# Patient Record
Sex: Female | Born: 1935
Health system: Southern US, Community
[De-identification: ages and names within clinical notes are randomized; demographics above are authoritative.]

## PROBLEM LIST (undated history)

## (undated) DIAGNOSIS — E119 Type 2 diabetes mellitus without complications: Secondary | ICD-10-CM

## (undated) DIAGNOSIS — Z9289 Personal history of other medical treatment: Secondary | ICD-10-CM

## (undated) DIAGNOSIS — D649 Anemia, unspecified: Secondary | ICD-10-CM

## (undated) DIAGNOSIS — F419 Anxiety disorder, unspecified: Secondary | ICD-10-CM

## (undated) DIAGNOSIS — I639 Cerebral infarction, unspecified: Secondary | ICD-10-CM

## (undated) DIAGNOSIS — E785 Hyperlipidemia, unspecified: Secondary | ICD-10-CM

## (undated) DIAGNOSIS — H409 Unspecified glaucoma: Secondary | ICD-10-CM

## (undated) DIAGNOSIS — M1990 Unspecified osteoarthritis, unspecified site: Secondary | ICD-10-CM

## (undated) DIAGNOSIS — K219 Gastro-esophageal reflux disease without esophagitis: Secondary | ICD-10-CM

## (undated) DIAGNOSIS — F32A Depression, unspecified: Secondary | ICD-10-CM

## (undated) DIAGNOSIS — Z951 Presence of aortocoronary bypass graft: Secondary | ICD-10-CM

## (undated) DIAGNOSIS — Z5189 Encounter for other specified aftercare: Secondary | ICD-10-CM

## (undated) DIAGNOSIS — I10 Essential (primary) hypertension: Secondary | ICD-10-CM

## (undated) DIAGNOSIS — M810 Age-related osteoporosis without current pathological fracture: Secondary | ICD-10-CM

## (undated) DIAGNOSIS — I2510 Atherosclerotic heart disease of native coronary artery without angina pectoris: Secondary | ICD-10-CM

## (undated) HISTORY — DX: Age-related osteoporosis without current pathological fracture: M81.0

## (undated) HISTORY — DX: Anemia, unspecified: D64.9

## (undated) HISTORY — DX: Cerebral infarction, unspecified: I63.9

## (undated) HISTORY — DX: Essential (primary) hypertension: I10

## (undated) HISTORY — DX: Unspecified glaucoma: H40.9

## (undated) HISTORY — DX: Type 2 diabetes mellitus without complications: E11.9

## (undated) HISTORY — DX: Hyperlipidemia, unspecified: E78.5

## (undated) HISTORY — DX: Personal history of other medical treatment: Z92.89

## (undated) HISTORY — DX: Encounter for other specified aftercare: Z51.89

## (undated) HISTORY — DX: Depression, unspecified: F32.A

## (undated) HISTORY — DX: Gastro-esophageal reflux disease without esophagitis: K21.9

## (undated) HISTORY — DX: Atherosclerotic heart disease of native coronary artery without angina pectoris: I25.10

## (undated) HISTORY — DX: Anxiety disorder, unspecified: F41.9

## (undated) HISTORY — DX: Presence of aortocoronary bypass graft: Z95.1

## (undated) HISTORY — DX: Unspecified osteoarthritis, unspecified site: M19.90

## (undated) NOTE — *Deleted (*Deleted)
Neuromuscular Re-education:  Note: Patient has difficulty rating her dizziness on 0-10 scale and does better with reporting her dizziness as mild, medium or high.   VOR X 1 exercise:  Patient performed VOR X 1 horizontal in standing 3 reps of 1 minute each with mod verbal cues for technique initially.  Patient reports 2/10 dizziness with first rep and reports that her dizziness decreased with subsequent trials. Added VOR x1 with 1 minute reps in standing progression to home exercise program.  Airex pad:  On firm surface and then on Airex pad, patient performed feet together progressions and semi-tandem progressions with alternating lead leg with and without horizontal and vertical head turns with CGA.  Patient reports mild increase in dizziness with head turns and unsteadiness.  Discussed safety precautions with performing HEP at home. Demonstrated and discussed standing in corner with chair in front for safety and then patient demonstrated.  Airex balance beam: Performed static stance with normal and then narrow base of support static holds and then with body turns with CGA. Patient with increased sway noted. Patient reports "high" level of dizziness with this activity.  Ambulation with head turns:  Patient performed 59' trials of forwards and retro ambulation with horizontal and vertical head turns with CGA.  Patient demonstrates no veering. Patient with decreased step length and cadence with retro ambulation. Patient reports "mild" dizziness with these activities.  Patient reports increased dizziness with activities with head turns and body turns this date.  Worked on progressions of VOR x1 exercise, patient able to progress to standing 1 minute repetitions for home exercise program, but required verbal cueing for technique.  Issued semitandem progressions and feet together progressions with horizontal and vertical head turns on firm surface for home exercise program.  Plan to review  home exercise program next session.  Patient would benefit from continued PT services to further address goals and functional deficits.

---

## 2005-07-22 ENCOUNTER — Emergency Department: Payer: Self-pay | Admitting: Emergency Medicine

## 2007-04-16 DIAGNOSIS — Z951 Presence of aortocoronary bypass graft: Secondary | ICD-10-CM

## 2007-04-16 HISTORY — DX: Presence of aortocoronary bypass graft: Z95.1

## 2007-05-07 ENCOUNTER — Encounter (INDEPENDENT_AMBULATORY_CARE_PROVIDER_SITE_OTHER): Payer: Self-pay | Admitting: Cardiovascular Disease

## 2007-05-07 ENCOUNTER — Inpatient Hospital Stay (HOSPITAL_COMMUNITY): Admission: AD | Admit: 2007-05-07 | Discharge: 2007-05-15 | Payer: Self-pay | Admitting: Cardiovascular Disease

## 2007-05-07 HISTORY — PX: CARDIAC CATHETERIZATION: SHX172

## 2007-05-08 ENCOUNTER — Ambulatory Visit: Payer: Self-pay | Admitting: Thoracic Surgery (Cardiothoracic Vascular Surgery)

## 2007-05-09 HISTORY — PX: CORONARY ARTERY BYPASS GRAFT: SHX141

## 2007-06-06 ENCOUNTER — Encounter (HOSPITAL_COMMUNITY): Admission: RE | Admit: 2007-06-06 | Discharge: 2007-09-04 | Payer: Self-pay | Admitting: Cardiovascular Disease

## 2007-06-12 ENCOUNTER — Ambulatory Visit: Payer: Self-pay | Admitting: Thoracic Surgery (Cardiothoracic Vascular Surgery)

## 2007-06-12 ENCOUNTER — Encounter
Admission: RE | Admit: 2007-06-12 | Discharge: 2007-06-12 | Payer: Self-pay | Admitting: Thoracic Surgery (Cardiothoracic Vascular Surgery)

## 2007-09-05 ENCOUNTER — Encounter (HOSPITAL_COMMUNITY): Admission: RE | Admit: 2007-09-05 | Discharge: 2007-10-13 | Payer: Self-pay | Admitting: Cardiovascular Disease

## 2009-07-31 IMAGING — CR DG CHEST 2V
2 series · 2 of 2 positions shown · non-contrast
Comparison: None

CLINICAL DATA: Preoperative assessment for heart surgery, abnormal
stress test, history hypertension, diabetes

CHEST - 2 VIEW

[w chest pa]
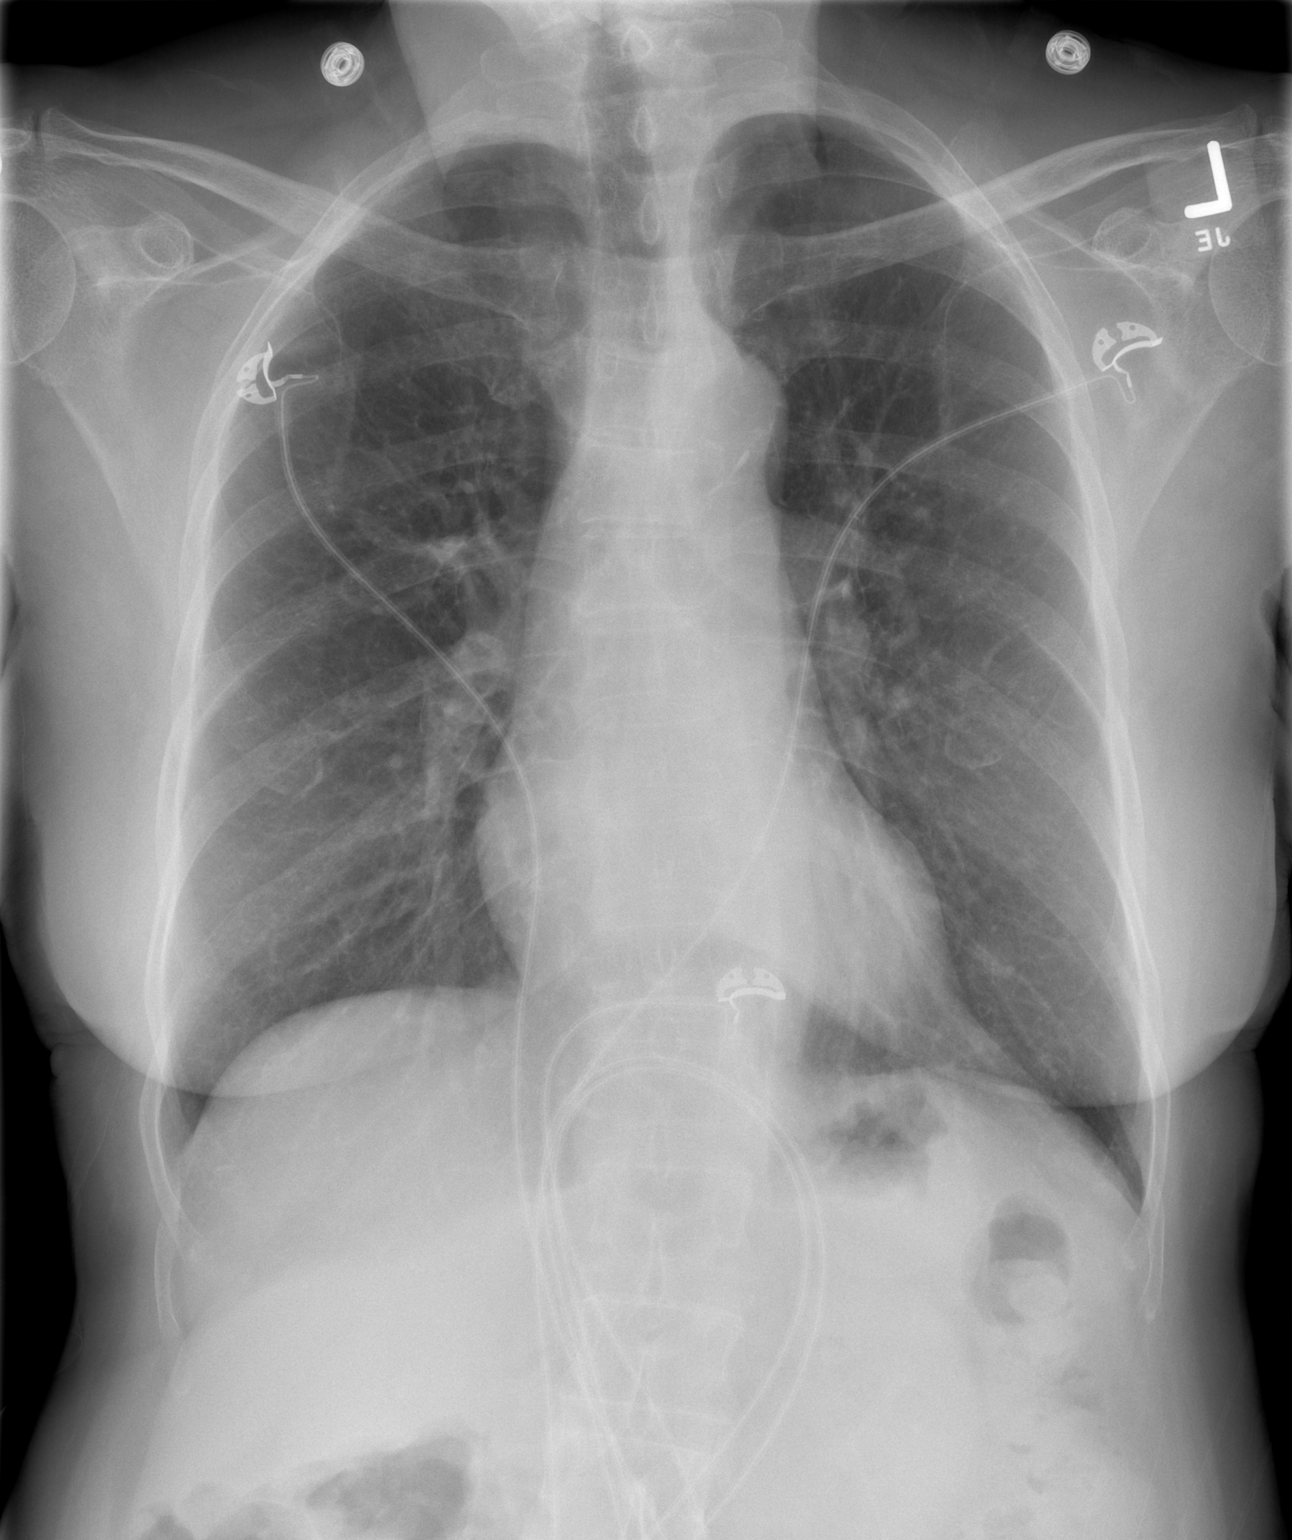

[w chest lat]
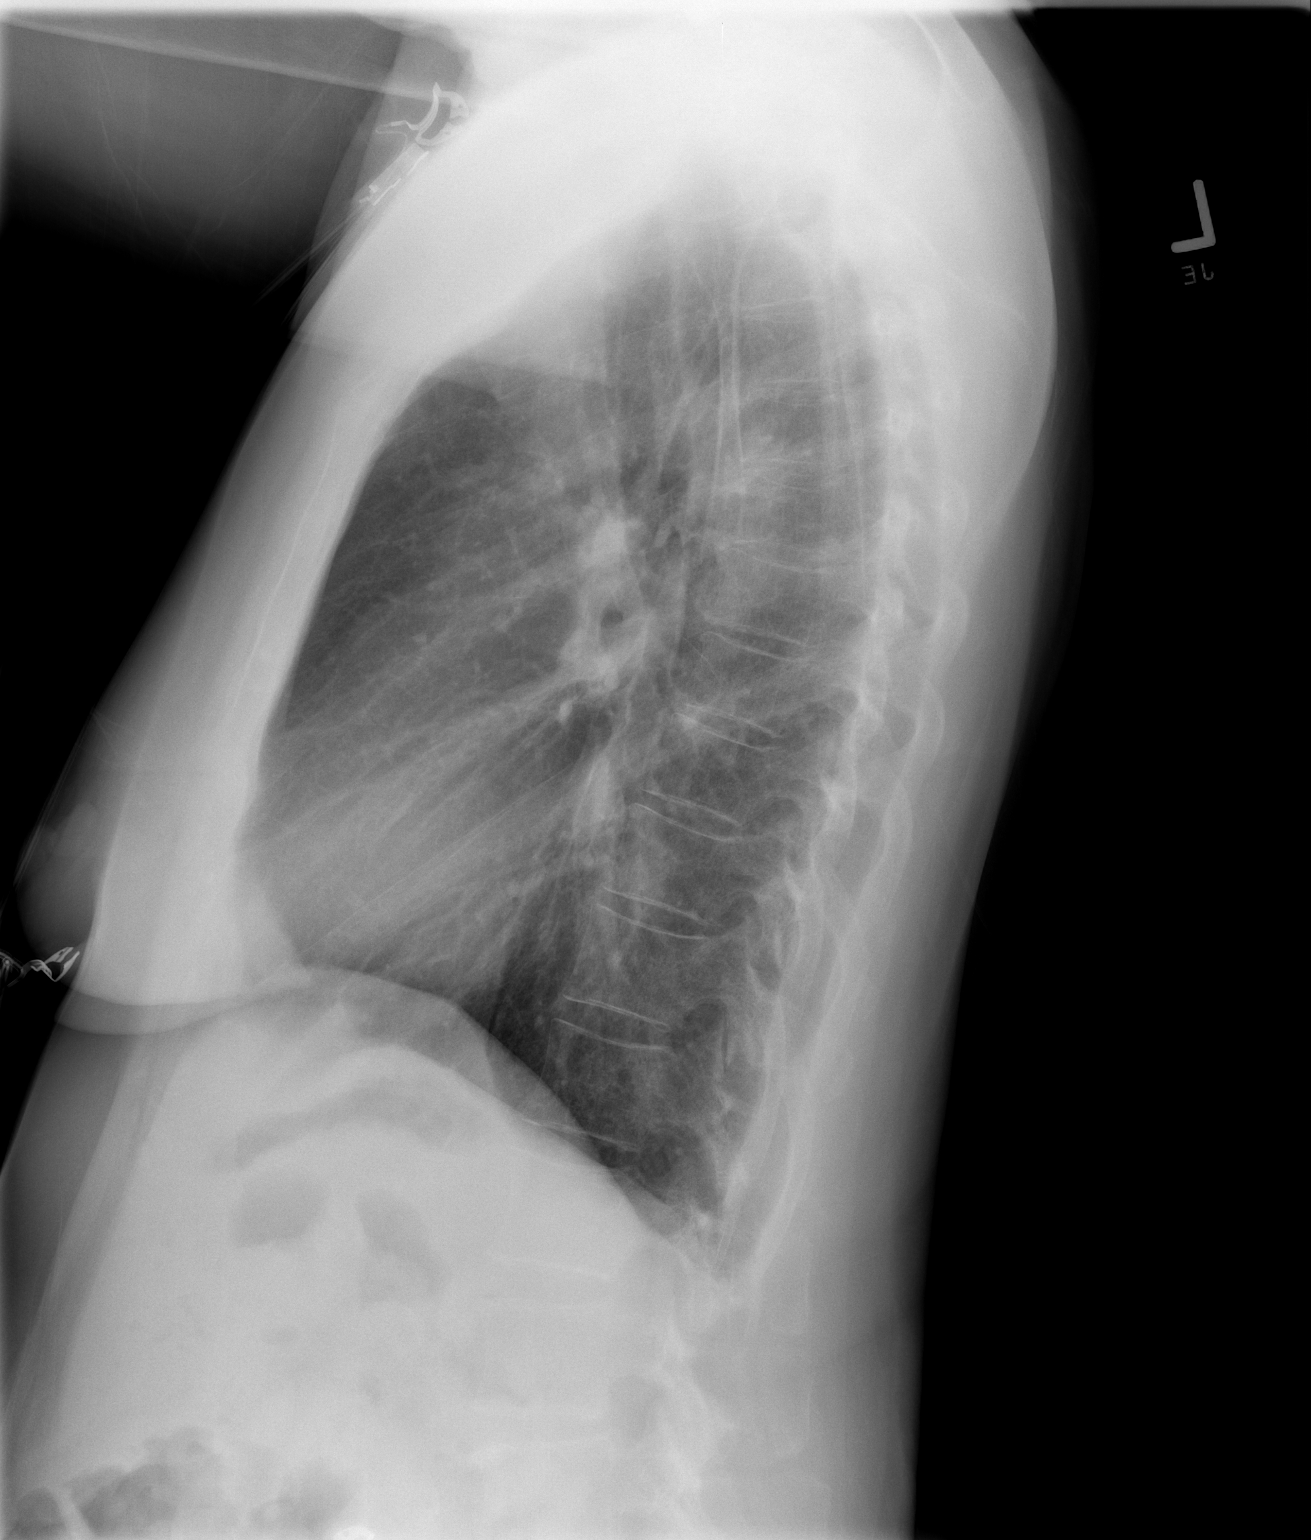

[2 of 2 positions shown; findings below may reference images not displayed]

FINDINGS: Normal heart size, mediastinal contours, and pulmonary vascularity.
Mildly hyperexpanded lungs without infiltrate or effusion.
Broad-based levoconvex scoliosis thoracolumbar spine.
Bony demineralization.
IMPRESSION: Mild pulmonary hyperexpansion and scoliosis without acute
abnormalities.

## 2010-02-13 HISTORY — PX: TRANSTHORACIC ECHOCARDIOGRAM: SHX275

## 2010-05-30 NOTE — Cardiovascular Report (Signed)
NAMEABBYGAYLE, HELFAND NO.:  [id number]   MEDICAL RECORD NO.:  [id number]          PATIENT TYPE:  INP   LOCATION:  4711                         FACILITY:  [facility]   PHYSICIAN:  Nicki Guadalajara, M.D.     DATE OF BIRTH:  December 01, 1932   DATE OF PROCEDURE:  05/07/2007  DATE OF DISCHARGE:                            CARDIAC CATHETERIZATION   INDICATIONS:  [name] is a 77 year old African-American female  who is a patient of Dr. Loma Sender.  She had recently experienced  an episode of chest discomfort after lifting a heavy trash can.  She has  been very active for most of her life.  She has a history of type 2  diabetes mellitus for 10 years.  She recently underwent a stress Myoview  study, which was abnormal demonstrating mild-to-moderate ischemia in the  mid inferior to inferolateral segment.  Definitive cardiac  catheterization was recommended.   PROCEDURE:  After premedication with prednisone, IV Benadryl, Pepcid for  shell allergy, the patient was prepped and draped in usual fashion.  She  also received 3 mg of intravenous Valium.  Her right femoral artery was  punctured anteriorly and a 5-French sheath was inserted.  Diagnostic  catheterization was done utilizing 5-French Judkins for left and right  coronary catheters.  An initial attempt was made to cannulate the left  subclavian system since the patient may require CBG revascularization  surgery.  However, this aortic knob was fairly calcified and since the  catheter did not seem to go easily into this region, the decision was  made not to aggressively pursue this secondary to potential for  thromboembolic risk.  A 5-French pigtail catheter was used for biplane  cine ventriculography.  Distal aortography was also performed.  The  patient tolerated the procedure well.  She returned to her room in  satisfactory condition.   HEMODYNAMIC DATA:  Central aortic pressure was 130/60.  Left ventricular  pressure  was 130/6, post A-wave 12.   ANGIOGRAPHIC DATA:  There was a moderate coronary calcification  involving the left main, LAD system, RCA.   The left main had 20%-30% focal narrowing proximally.  The left main was  a large-caliber vessel which trifurcated into an LAD and intermediate  vessel and left circumflex coronary artery.   The LAD was calcified and had diffuse 90% stenosis proximally involving  the takeoff of the first diagonal vessel.  The first diagonal vessel had  diffuse 80%-90% proximal stenosis and then diffuse 90% mid stenosis  prior to its bifurcation.  The mid LAD had 50%-60% narrowing followed by  80% narrowing after second septal perforating artery.  There was 70%  narrowing after a mid diagonal vessel.  There was a 90% apical LAD  stenosis.   The intermediate vessel had diffuse 99% stenosis, but was moderate size  caliber vessel which did extend to the LV apex.   The circumflex vessel had 60%-70% proximal stenosis after the first  marginal branch.  There was then an 80% stenosis in the mid distal AV  groove circumflex prior to giving rise to  an additional marginal vessel.   The right coronary artery was a large caliber vessel that was diffusely  diseased and had 70%-80% proximal stenosis followed by 70%-80% mid  stenosis and then had 95% bifurcation stenosis involving the acute  marginal branch and distal RCA.  The distal RCA ended in a small PDA  system and posterolateral system.   Biplane cine ventriculography revealed preserved global contractility  with an ejection fraction of approximately 55%.  However, on the RAO  projection, there was a small focal area of mid distal mild  hypocontractility and on the LAO projection there was a mid  posterolateral focal hypocontractility.   Distal aortography revealed mild tortuous infrarenal aorta without  significant stenosis.  There was no evidence for renal artery stenosis.   IMPRESSION:  1. Preserved left  ventricular contractility with an ejection fraction      of 55% but with evidence for focal mild hypocontractility involving      the mid distal inferior wall and mid posterolateral wall.  2. Severe multivessel coronary artery disease with evidence for      significant coronary calcification, 20%-30% proximal left main      stenosis; diffuse 90% proximal left anterior descending stenosis      with 90% stenosis diffusely in the first diagonal branch, 60% and      80% mid left anterior descending stenosis followed by 70% mid      distal left anterior descending stenosis and 90% apical left      anterior descending stenosis; diffuse 99% stenosis in a moderate-      sized ramus intermediate vessel; 67% mid atrioventricular groove      circumflex and 80% distal atrioventricular groove circumflex      stenosis; diffuse 70%-80% proximal to mid right coronary artery      stenosis with 95% bifurcation stenosis involving the right coronary      artery in the region of the crux involving the takeoff of the acute      marginal branch.   RECOMMENDATIONS:  CBG revascularization surgery.           ______________________________  Nicki Guadalajara, M.D.     TK/MEDQ  D:  05/07/2007  T:  05/07/2007  Job:  [id number]   cc:   Celine Mans, MD  Rudi Coco, MD

## 2010-05-30 NOTE — Consult Note (Signed)
NAMEKAIJAH, ABTS                  ACCOUNT NO.:  [id number]   MEDICAL RECORD NO.:  [id number]          PATIENT TYPE:  INP   LOCATION:  4711                         FACILITY:  [facility]   PHYSICIAN:  Salvatore Decent. Dorris Fetch, M.D.DATE OF BIRTH:  03-28-1932   DATE OF CONSULTATION:  05/07/2007  DATE OF DISCHARGE:                                 CONSULTATION   REASON FOR CONSULTATION:  Severe three-vessel coronary disease.   HISTORY OF PRESENT ILLNESS:  Ms. Olthoff is a 77 year old woman who has a  past medical history significant for type 2 noninsulin-dependent  diabetes, dyslipidemia, questionable recent onset hypertension, who  presents following several episodes of atypical chest pain.  She has  seen this for quite some time.  She has been having pain in her back and  as frequently when she is upset or exert herself under the left shoulder  blade.  It feels like it is in the muscle.  She was given a prescription  for Skelaxin and with taking on p.r.n. basis and did have relief from  the pain with that.  Recently while at work after removing a very heavy  object, she experienced a different pain, which was the anterior chest  pain with extension into her arms and shoulders and they felt very heavy  and tired.  She mentioned these symptoms to her physician and was  referred to [facility]. A 2-D echocardiogram was  performed, which showed borderline left ventricular hypertrophy.  There  was normal left ventricular function.  There was no significant valvular  pathology.  A stress Myoview scan was performed, which showed a mild-to-  moderate perfusion defect in the inferior wall.  The defect was  reversible.  Today, she underwent cardiac catheterization where she was  found to have normal left ventricular function, but heavily calcified  coronary arteries with severe three-vessel coronary disease.  The  patient is currently pain free.   PAST MEDICAL HISTORY:  1. Type 2  adult-onset diabetes.  2. Hyperlipidemia.  3. Recent onset hypertension.  4. Anxiety.   ADMISSION MEDICATIONS:  Her medications following admission were,  1. Avandamet 4 mg b.i.d.  2. Skelaxin 800 mg t.i.d. p.r.n.  3. Clorazepate 3.75 mg 1-2 tablets p.r.n. as needed.  4. Nexium 40 mg daily.  5. Aspirin 81 mg daily.  6. Bystolic 2.5 mg daily.  7. Crestor 5 mg daily.  8. She also has a prescription for p.r.n. nitroglycerin, but does not      utilize that.   ALLERGIES:  She has no known drug allergies, but is allergic to  [facility], which causes rash.   FAMILY HISTORY:  Significant for cardiovascular disease.   SOCIAL HISTORY:  She still works.  She lives with 2 of her sons.  She  does not smoke and never has.   REVIEW OF SYSTEMS:  See HPI.  She has noted additional symptoms other  than the HPI.  Recently, she has been feeling more tired and fatigued  over the past several months.  She attributed this to old age.  She  denies any  recent fevers, chills, or sweats.  No change in bowel or  bladder habits.  No stroke or TIA symptoms.  No peripheral edema,  paroxysmal nocturnal dyspnea, or orthopnea.   All other systems are negative.   PHYSICAL EXAMINATION:  GENERAL:  Ms. Brookover is a 77 year old African  American female, in no acute distress.  NEUROLOGICALLY:  She is alert, oriented x3, appropriate, and grossly  intact.  HEENT:  Unremarkable.  NECK:  Supple without thyromegaly, adenopathy, or bruits.  CARDIAC:  Regular rate and rhythm.  Normal S1 and S2.  No murmurs, rubs,  or gallops.  LUNGS:  Clear with equal breath sounds bilaterally.  ABDOMEN:  Soft, nontender.  EXTREMITIES:  Without clubbing, cyanosis, or edema.  She has 2+ pulses  throughout.  SKIN:  Warm and dry.   LABORATORY DATA:  White count 4.1, hematocrit 37, platelets 227.  Glucose 225, BUN and creatinine 13 and 0.88, sodium 130, potassium 3.9.  PT 10.6, PTT 31.  Total cholesterol was 239, HDL was elevated at 90,  and  LDL 135.   IMPRESSION:  Ms. Polsky is a 77 year old woman with multiple cardiac risk  factors who presents with an episode consistent with unstable angina.  She has had previous pain, which was different in her back.  It was  unclear if this is anginal or not.  It is clear, however, that she does  have severe three-vessel coronary disease in the setting of diabetes and  preserved left ventricular function.  Coronary bypass grafting is  indicated for survival benefit and hopefully relief of symptoms, while  the indications, risks and benefits and alternatives discussed in detail  with the patient and her family.  We discussed the general detail of the  operation, need for general anesthesia, incisions to be used and general  approach.  We also discussed expected hospital stay and overall  recovery.  We did discuss the risks which include but are not limited to  death, stroke, myocardial infarction, deep venous thrombosis, pulmonary  embolism, bleeding, possible need for transfusions, infections, as well  as other organ system dysfunction, including respiratory, renal or  gastrointestinal complications.  She understands and accepts these risks  and agrees to proceed.  We will plan to proceed with surgery on Friday,  May 09, 2007.      Salvatore Decent Dorris Fetch, M.D.  Electronically Signed     SCH/MEDQ  D:  05/07/2007  T:  05/08/2007  Job:  [id number]   cc:   Nicki Guadalajara, M.D.  Antonieta Iba, MD  Loma Sender

## 2010-05-30 NOTE — Op Note (Signed)
Laura, Ferguson                  ACCOUNT NO.:  [id number]   MEDICAL RECORD NO.:  [id number]          PATIENT TYPE:  INP   LOCATION:  2303                         FACILITY:  [facility]   PHYSICIAN:  Salvatore Decent. Dorris Fetch, M.D.DATE OF BIRTH:  1932-05-22   DATE OF PROCEDURE:  DATE OF DISCHARGE:                               OPERATIVE REPORT   PREOPERATIVE DIAGNOSIS:  Three-vessel coronary disease with new-onset  angina.   POSTOPERATIVE DIAGNOSIS:  Three-vessel coronary disease with new-onset  angina.   PROCEDURE:  Median sternotomy, extracorporeal circulation, coronary  artery bypass grafting x6 (left internal mammary artery to LAD,  saphenous vein graft to ramus intermedius, sequential saphenous vein  graft to obtuse marginals 1 and 2, sequential saphenous vein graft to  acute marginal and distal right coronary), and endoscopic vein harvest  right leg.   SURGEON:  Salvatore Decent. Dorris Fetch, MD   ASSISTANT:  Sheliah Plane, MD   SECOND ASSISTANT:  Theda Belfast, [place]   ANESTHESIA:  General.   FINDINGS:  Good quality targets, good quality mammary vein, fair-quality  but satisfactory normal left ventricular size, good left ventricular  function.   CLINICAL NOTE:  Laura Ferguson is a 77 year old woman with new-onset angina.  She had a positive Cardiolite test and underwent cardiac catheterization  where she was found to have critical three-vessel coronary disease with  heavily calcified vessels.  The patient was referred for coronary artery  bypass grafting.  The indications, risks, benefits, and alternatives  were discussed in detail with the patient and her family.  She  understood, accepted the risks, and agreed to proceed.   OPERATIVE NOTE:  Laura Ferguson was brought to the preop holding area on May 09, 2007.  There, lines were placed by anesthesia for monitoring  arterial and central venous and pulmonary arterial pressures.  Intravenous antibiotics were administered.  She was taken  to the  operating room, anesthetized, and intubated.  A Foley catheter was  placed.  The chest, abdomen, and legs were prepped and draped in usual  fashion.  Incision was made in the medial aspect of the right leg at the  level of the medial greater saphenous vein, was identified and was  harvested endoscopically from the right leg.  The saphenous vein was  satisfactory.   Simultaneously, a median sternotomy was performed and the left internal  mammary artery was harvested using standard technique.  A 2000 units of  heparin was administered during the vessel harvest, remainder of the  full heparin dose was given prior to opening the pericardium.  The  sternum was relatively narrow, harvest of the mammary artery was more  difficult because of deep interspaces; however, the mammary was a good-  quality vessel with excellent flow when divided distally.   The pericardium was opened.  The remaining full heparin dose was given.  The ascending aorta was of normal size with no evident atherosclerotic  disease.  The aorta was cannulated via concentric 2-0 Ethibond pledgeted  and pursestring sutures.  A dual stage venous cannula was placed via  pursestring suture in the right  appendage.  After confirming adequate  anticoagulation with ACT measurement, cardiopulmonary bypass was  instituted, and the patient was cooled to 32 degrees Celsius.  The  coronary arteries were inspected and anastomotic sites were chosen.  Of  note, the diagonal branch to the LAD was diffusely diseased and was too  small to graft beyond the disease.  The remaining target vessels were  all graftable.  The LAD was intramyocardial.   The conduits were inspected and cut to length.  A foam pad was placed in  the pericardium to protect left phrenic nerve and insulate the heart.  A  temperature probe was placed in myocardial septum and a cardioplegic  cannula was placed in the ascending aorta.   The aorta was cross-clamped.   The left ventricle was emptied via aortic  root vent.  Cardiac arrest was achieved with combination of cold,  antegrade blood cardioplegia, and topical iced saline.  A 1 L of  cardioplegia was administered.  Myocardial septal temperature was less  than 10 degrees Celsius.  There was a good diastolic arrest, following  distal anastomoses were performed.   First, a reversed saphenous vein graft was placed sequentially to the  acute marginal and distal right coronary.  There was a severe stenosis  at the bifurcation of the acute marginal.  It was a 1.5-mm good quality  target.  A side-to-side anastomosis was performed to this vessel with a  running 7-0 Prolene suture.  At the completion of each anastomosis, it  was probed proximally and distally to ensure patency before tying the  suture.  The distal end of the vein then was cut to length and was  anastomosed end-to-side to the distal right coronary, which was a 1.5 mL  vessel.  The posterior descending and other terminal branches of the  right coronary were too small to graft separately.  Cardioplegia was  administered down the vein graft.  There was good flow and good  hemostasis.   Next, a reverse saphenous vein grafts placed end-to-side to ramus  intermedius branch.  This was a 1.5-mm good quality target vessel.  The  vein graft was anastomosed end-to-side with a running 7-0 Prolene  suture.  This vein segment was smaller in caliber than the other 2 vein  segments, but was still acceptable and satisfactory for use as a graft.   Next, a reversed saphenous vein graft was placed sequentially to obtuse  marginal 1 and 2.  Obtuse marginal 1 was a relatively high anterolateral  vessel, was intramyocardial as a 1.5-mm diameter vessel.  OM-2 was a  larger caliber vessel, was grafted just before bifurcated.  There was  some moderate plaquing at the site of anastomosis.  Again, there was  good flow through this graft and good hemostasis at  anastomoses.   Next, the left internal mammary artery was brought through a window in  the pericardium.  The distal end was beveled and was anastomosed end-to-  side to the LAD.  The LAD was intramyocardial.  It accepted a 1.5-mm  probe.  There was some disease proximal to the anastomosis, but no  disease distally to the apex.  The mammary was a 2-mm good quality  conduit.  The anastomosis was performed with a running 8-0 Prolene  suture.  After completion of the mammary to LAD anastomosis, the bulldog  clamp was removed to inspect for hemostasis.  Immediate and rapid septal  rewarming was noted.  The bulldog clamp was replaced.  Additional  cardioplegia was administered.   The vein grafts were cut to length.  The cardioplegic cannula was  removed from the ascending aorta and proximal vein graft anastomoses  were performed to 4.5 mL punch aortotomies with running 6-0 Prolene  sutures.  At the completion of final proximal anastomoses, the patient  was placed in Trendelenburg position.  The bulldog clamp was again  removed from the left mammary artery.  Lidocaine was administered.  The  aortic root was de-aired and the aortic cross-clamp was removed.  The  total crossclamp time was 93 minutes.  The patient spontaneously resumed  sinus rhythm and did not require defibrillation.   The patient is being rewarmed, all proximal and distal anastomoses were  inspected for hemostasis.  Epicardial pacing wires were placed on the  right ventricle and right atrium.  Atrial pacing was initiated as the  patient had a relatively slow sinus rhythm in the 60s.  She was then  paced at 90 beats per minute.  Then, the patient rewarmed to a core  temperature of 37 degrees Celsius.  She was weaned from cardiopulmonary  bypass on the first attempt.  Total bypass time was 135 minutes.  She  did not require inotropic support.  The initial cardiac index was 2 L  per minute per meter squared.  The patient remained  hemodynamically  stable throughout post bypass period with the exception of initially  decreased cardiac output, initially after closure of the sternotomy  which responded to volume administration.   A test dose of protamine was administered and was well tolerated.  The  atrial aortic cannulae were removed.  The remaining protamine was  administered without incident.  Chest was irrigated with 1 L of warm  normal saline containing 1 g of vancomycin.  Hemostasis was achieved.  The pericardium was reapproximated with interrupted 3-0 silk sutures.  It came together easily without tension or kinking the underlying  grafts.  The left pleural and mediastinal chest tubes placed separate  subcostal incisions.  The sternum was closed with combination of single  and double interrupted heavy gauge stainless steel wires.  Pectoralis  fascia, subcutaneous tissue, and skin were closed in standard fashion.  Of note, the patient did have transient decrease in cardiac index with  closure of the sternum, although no significant EKG changes or change in  blood pressure or pulmonary arterial pressures.  This improved with time  and volume administration.  At the completion of the procedure; all  sponge, needle, and instruments counts were correct, and the patient was  taken from the operating room to the surgical intensive care unit in  fair condition.      Salvatore Decent Dorris Fetch, M.D.  Electronically Signed     SCH/MEDQ  D:  05/09/2007  T:  05/10/2007  Job:  [id number]   cc:   Laura Ferguson

## 2010-05-30 NOTE — Assessment & Plan Note (Signed)
OFFICE VISIT   Laura Ferguson, Laura Ferguson  DOB:  1932/11/17                                        Jun 12, 2007  CHART #:  [id number]   The patient is a 77 year old woman who had coronary bypass grafting x6  on April 24.  Postoperatively she had some volume overload, which is  typical as well as thrombocytopenia, but really did well and was  discharged home on postoperative day #4.  Since then she has continued  to do well.  She has minimal discomfort.  She does have a little ache in  her left shoulder.  This is not exertional.  She has been walking.  Prior to surgery she could only walk about 50 feet before having pain.  Now she is having no pain or shortness of breath.  She did have some  swelling in her right leg.  She was started on Lasix but did not  tolerate it because of her blood pressure; however, with elevation the  swelling has resolved.   PHYSICAL EXAMINATION:  The patient is a 77 year old African American  female in no acute distress.  Her blood pressure is 126/70, pulse 78,  respirations are 18.  Her oxygen saturation is 98% on room air.  Lungs  are clear with equal breath sounds.  Her cardiac exam has a regular rate  and rhythm.  Normal S1 and S2.  There are no rubs or murmurs.  The  sternum is stable.  The sternal incision is clean, dry, and intact.  Chest tube sites are healing well.  Her leg incisions are healing well.  She has a trace edema in her right lower extremity.   Chest x-ray shows good aeration of the lungs bilaterally with no  significant effusion or infiltrate.   IMPRESSION:  The patient is doing extremely well at this point in time.  She is now about a month out from coronary bypass grafting x6.  Her  exercise tolerance is already good and has continued to improve.  She is  not having any significant pain and is not having to take any pain  medications since she has been home.  She may begin driving.  Appropriate precautions were  discussed.  She is not to lift any objects  that weigh greater than 10 pounds for at least another 2 weeks and after  that can build up gradually.  She will continue to be followed by Dr.  Tresa Endo and  Dr. Vear Clock.  I would be happy to see her back at any time if I can be  of any further assistance with her care.   Salvatore Decent Dorris Fetch, M.D.  Electronically Signed   SCH/MEDQ  D:  06/12/2007  T:  06/12/2007  Job:  [id number]   cc:   Nicki Guadalajara, M.D.  Loma Sender

## 2010-05-30 NOTE — Discharge Summary (Signed)
Laura Ferguson                  ACCOUNT NO.:  [id number]   MEDICAL RECORD NO.:  [id number]          PATIENT TYPE:  INP   LOCATION:  2016                         FACILITY:  [facility]   PHYSICIAN:  Laura Ferguson, M.D.DATE OF BIRTH:  1932/03/16   DATE OF ADMISSION:  05/07/2007  DATE OF DISCHARGE:                               DISCHARGE SUMMARY   FINAL DIAGNOSES:  Severe three-vessel coronary artery disease with new-  onset angina.   IN-HOSPITAL DIAGNOSES:  1. Postoperative thrombocytopenia.  2. Volume overload, postoperatively.   SECONDARY DIAGNOSES:  1. Type 2 diabetes mellitus.  2. Hyperlipidemia.  3. Hypertension.  4. Anxiety.   IN HOSPITAL OPERATIONS AND PROCEDURES:  1. Cardiac catheterization.  2. Coronary artery bypass grafting x6 using a left internal mammary      artery to left anterior descending, saphenous vein graft to ramus      intermedius, sequential saphenous vein graft to obtuse marginals I      and II, sequential saphenous vein graft to acute marginal, distal      right coronary artery.  Endoscopic vein harvesting from right leg      done.   HISTORY AND PHYSICAL AND HOSPITAL COURSE:  The patient is a 77 year old  female with new-onset angina.  She had a positive Cardiolite test and  underwent cardiac catheterization where she was found to have critical  three-vessel coronary artery disease and heavily calcified vessels.  The  patient was referred for coronary artery bypass grafting.  She was seen  and evaluated by Dr. Dorris Ferguson.  Dr. Dorris Ferguson discussed with the  patient undergoing coronary artery bypass grafting.  He discussed risks  and benefits with the patient.  The patient nods her understanding and  agreed to proceed.  Surgery was scheduled for May 09, 2007.   For details of the patient's past medical history and physical exam,  please see dictated H&P.   The patient was taken to the operating room on May 09, 2007, where she  underwent  coronary artery bypass grafting x6 using a left internal  mammary artery to left anterior descending, saphenous vein graft to  ramus intermedius, sequential saphenous vein graft to obtuse marginals I  and II, sequential saphenous vein graft acute marginal and distal right  coronary artery.  Endoscopic vein harvesting done from right leg.  The  patient tolerated this procedure well and was transferred to the  Intensive Care Unit in stable condition.  Postoperatively, the patient  was noted to be hemodynamically stable.  She was extubated in the  evening of surgery.  Post extubation, the patient noted to be alert and  oriented x4.  Neuro, intact.  The patient's postoperative course was  pretty much unremarkable.  Chest x-ray done on postop day #1 was stable.  Minimum drainage from chest tubes and chest tube discontinued in normal  fashion.  Repeat followup chest x-ray remained stable with no  pneumothorax.  She was able to be weaned off oxygen sating greater than  90% on room air.  Postoperatively, the patient was noted to be in normal  sinus rhythm.  All drips were weaned and discontinued.  Swan-Ganz  catheter discontinued in normal fashion.  The patient's heart rate and  blood pressure remained stable and she was able to be started on low-  dose beta blocker.  She remained in normal sinus rhythm during the  postoperative course.  The patient was eventually also started on low-  dose ACE inhibitor.  Postoperatively, the patient's platelet count was  noted to be low with platelets dropping to 64 on postop day #2.  Aspirin  was placed on hold.  Following day, the platelet count at 62.  Noted to  be stable and aspirin was restarted and heparin flush was discontinued.  We will follow up.  The patient had mild volume overload  postoperatively.  She was started on diuretics.  Daily weights obtained.  The patient was back near baseline weight prior to discharge home.  The  patient remained  hemodynamically stable.  She was ambulating with  cardiac rehab well with minimal assistance.  She was tolerating diet  well.  No nausea, vomiting noted.  She was transferred out from the SICU  to 2000 postop day #2.  By postop day #3, the patient's vital signs  noted to be stable.  She was afebrile.  Sats greater than 90% on room  air.  Blood sugars were followed and remained stable.  She was restarted  on her Avandamet from dose and discontinued from Lantus.  Blood sugars  sugars remained stable.   Labs showed a white count of 7.6, hemoglobin of 11.0 hematocrit 32.0,  platelet count 62.  Sodium was 141, potassium 4.1, chloride of 110,  bicarb of 25, BUN of 19, creatinine 1.05, glucose of 109.   The patient is tentatively ready for discharge home in the a.m. and she  remained stable.   FOLLOW-UP APPOINTMENTS:  Follow-up appointment has been arranged with  Dr. Dorris Ferguson for Jun 12, 2007, at 12:00 noon p.m..  The patient will  need to obtain PMI chest x-ray 30 minutes prior to this appointment.  The patient needs followup with Dr. Mariah Ferguson in 2 weeks.  She will need to  contact his office to make these arrangements.   ACTIVITY:  The patient was instructed no driving, he agrees to do so, no  lifting over 10 pounds.  She is told to ambulate 3-4 times per day,  progress as tolerated and to continue her breathing exercises.   INCISIONAL CARE:  The patient is told to shower, washing his incisions  using soap and water.  She is to contact the office if she develops any  drainage or opening from any of her incision sites.   DIET:  The patient is to begin on diet to be low-fat, low-salt.   DISCHARGE MEDICATIONS:  1. Avandamet 4/500 b.i.d.  2. Skelaxin 800 mg t.i.d.  3. Nexium 40 mg daily.  4. Enteric-coated aspirin 81 mg daily.  5. Crestor 5 mg daily.  6. Travatan 0.004% 1 drop both eyes at night.  7. Toprol XL 25 mg daily.  8. Lisinopril 10 mg daily.  9. Potassium chloride 20 mEq  daily x3 days.  10.Lasix 40 mg daily x3 days.  11.Oxycodone 5 mg one-two tablets q. 4-6 h. p.r.n.      Theda Belfast, PA      Laura Ferguson, M.D.  Electronically Signed    KMD/MEDQ  D:  05/12/2007  T:  05/13/2007  Job:  [id number]   cc:   Laura Ferguson, M.D.  Marcial Pacas  Elmarie Mainland, MD

## 2010-10-10 LAB — CBC
HCT: 28.1 — ABNORMAL LOW
HCT: 28.3 — ABNORMAL LOW
HCT: 29.6 — ABNORMAL LOW
HCT: 31.1 — ABNORMAL LOW
HCT: 32 — ABNORMAL LOW
HCT: 32.1 — ABNORMAL LOW
HCT: 33 — ABNORMAL LOW
HCT: 36.2
HCT: 37.1
Hemoglobin: 10.6 — ABNORMAL LOW
Hemoglobin: 10.7 — ABNORMAL LOW
Hemoglobin: 11 — ABNORMAL LOW
MCHC: 33.3
MCHC: 34
MCHC: 34
MCHC: 34.3
MCHC: 34.4
MCHC: 34.4
MCHC: 34.5
MCV: 89.8
MCV: 90.1
MCV: 90.4
MCV: 90.7
MCV: 90.8
MCV: 91
MCV: 91.5
MCV: 91.7
MCV: 91.8
Platelets: 156
Platelets: 175
Platelets: 62 — ABNORMAL LOW
Platelets: 64 — ABNORMAL LOW
Platelets: 75 — ABNORMAL LOW
Platelets: 76 — ABNORMAL LOW
Platelets: 85 — ABNORMAL LOW
Platelets: 87 — ABNORMAL LOW
RBC: 3.26 — ABNORMAL LOW
RBC: 3.42 — ABNORMAL LOW
RBC: 3.49 — ABNORMAL LOW
RBC: 3.52 — ABNORMAL LOW
RBC: 4.12
RDW: 14.3
RDW: 14.9
RDW: 15.1
RDW: 15.3
RDW: 15.3
RDW: 15.3
RDW: 15.4
WBC: 4.7
WBC: 5.4
WBC: 5.7
WBC: 6.2
WBC: 6.7
WBC: 7.6

## 2010-10-10 LAB — BASIC METABOLIC PANEL
BUN: 12
BUN: 17
BUN: 17
BUN: 19
BUN: 28 — ABNORMAL HIGH
BUN: 31 — ABNORMAL HIGH
CO2: 24
CO2: 25
CO2: 25
CO2: 25
CO2: 28
Calcium: 8.4
Calcium: 8.8
Calcium: 8.9
Chloride: 108
Chloride: 108
Chloride: 109
Chloride: 110
Chloride: 110
Chloride: 116 — ABNORMAL HIGH
Creatinine, Ser: 1
Creatinine, Ser: 1.05
GFR calc Af Amer: >60
GFR calc Af Amer: >60
GFR calc Af Amer: >60
GFR calc non Af Amer: 52 — ABNORMAL LOW
GFR calc non Af Amer: 55 — ABNORMAL LOW
GFR calc non Af Amer: >60
Glucose, Bld: 109 — ABNORMAL HIGH
Glucose, Bld: 133 — ABNORMAL HIGH
Glucose, Bld: 134 — ABNORMAL HIGH
Glucose, Bld: 181 — ABNORMAL HIGH
Glucose, Bld: 242 — ABNORMAL HIGH
Potassium: 3.8
Potassium: 3.8
Potassium: 3.9
Potassium: 4
Potassium: 4
Potassium: 4.1
Potassium: 4.1
Sodium: 140
Sodium: 141
Sodium: 142
Sodium: 145

## 2010-10-10 LAB — COMPREHENSIVE METABOLIC PANEL
AST: 15
Albumin: 3.3 — ABNORMAL LOW
Calcium: 9
Creatinine, Ser: 1.1
GFR calc Af Amer: 59 — ABNORMAL LOW

## 2010-10-10 LAB — POCT I-STAT 3, ART BLOOD GAS (G3+)
Acid-base deficit: 2
Acid-base deficit: 5 — ABNORMAL HIGH
Acid-base deficit: 5 — ABNORMAL HIGH
Bicarbonate: 20.5
Bicarbonate: 22.9
Bicarbonate: 24.1 — ABNORMAL HIGH
O2 Saturation: 100
O2 Saturation: 96
Operator id: 179741
Operator id: 271091
Operator id: 3342
Operator id: 3342
Patient temperature: 35.1
TCO2: 22
TCO2: 23
TCO2: 23
pCO2 arterial: 33.2 — ABNORMAL LOW
pCO2 arterial: 38.3
pH, Arterial: 7.388
pH, Arterial: 7.441 — ABNORMAL HIGH
pH, Arterial: 7.479 — ABNORMAL HIGH
pO2, Arterial: 101 — ABNORMAL HIGH
pO2, Arterial: 114 — ABNORMAL HIGH
pO2, Arterial: 341 — ABNORMAL HIGH

## 2010-10-10 LAB — BLOOD GAS, ARTERIAL
Acid-base deficit: 0.5
Bicarbonate: 23.2
FIO2: 0.21
O2 Saturation: 97.4
Patient temperature: 98.7
TCO2: 24.3

## 2010-10-10 LAB — PROTIME-INR
INR: 1
Prothrombin Time: 13.8

## 2010-10-10 LAB — POCT I-STAT, CHEM 8
Chloride: 106
Glucose, Bld: 275 — ABNORMAL HIGH
HCT: 32 — ABNORMAL LOW
Hemoglobin: 10.9 — ABNORMAL LOW
Potassium: 3.8
Sodium: 143

## 2010-10-10 LAB — POCT I-STAT 4, (NA,K, GLUC, HGB,HCT)
Glucose, Bld: 160 — ABNORMAL HIGH
Glucose, Bld: 215 — ABNORMAL HIGH
Glucose, Bld: 92
Glucose, Bld: 99
HCT: 18 — ABNORMAL LOW
HCT: 25 — ABNORMAL LOW
HCT: 31 — ABNORMAL LOW
Hemoglobin: 10.5 — ABNORMAL LOW
Hemoglobin: 8.5 — ABNORMAL LOW
Operator id: 3342
Potassium: 4
Potassium: 4
Potassium: 4
Potassium: 5.6 — ABNORMAL HIGH
Sodium: 138
Sodium: 140
Sodium: 141

## 2010-10-10 LAB — HEPARIN LEVEL (UNFRACTIONATED): Heparin Unfractionated: 1.9 — ABNORMAL HIGH

## 2010-10-10 LAB — CROSSMATCH
ABO/RH(D): O POS
Antibody Screen: NEGATIVE

## 2010-10-10 LAB — I-STAT EC8
Acid-base deficit: 7 — ABNORMAL HIGH
BUN: 7
Chloride: 113 — ABNORMAL HIGH
Glucose, Bld: 237 — ABNORMAL HIGH
pCO2 arterial: 43.3
pH, Arterial: 7.267 — ABNORMAL LOW

## 2010-10-10 LAB — URINALYSIS, ROUTINE W REFLEX MICROSCOPIC
Bilirubin Urine: NEGATIVE
Glucose, UA: >1000 — AB
Ketones, ur: NEGATIVE
Specific Gravity, Urine: 1.02
pH: 5.5

## 2010-10-10 LAB — CREATININE, SERUM
Creatinine, Ser: 1.07
GFR calc Af Amer: >60
GFR calc non Af Amer: 50 — ABNORMAL LOW

## 2010-10-10 LAB — CLOSTRIDIUM DIFFICILE EIA: C difficile Toxins A+B, EIA: NEGATIVE

## 2010-10-10 LAB — APTT: aPTT: 30

## 2010-10-10 LAB — LIPID PANEL
Cholesterol: 186
HDL: 95
LDL Cholesterol: 85
Total CHOL/HDL Ratio: 2
Triglycerides: 31
VLDL: 6

## 2010-10-10 LAB — HEMOGLOBIN AND HEMATOCRIT, BLOOD: Hemoglobin: 8.8 — ABNORMAL LOW

## 2010-10-10 LAB — URINE MICROSCOPIC-ADD ON

## 2010-10-10 LAB — MAGNESIUM: Magnesium: 2.9 — ABNORMAL HIGH

## 2011-05-04 ENCOUNTER — Encounter (INDEPENDENT_AMBULATORY_CARE_PROVIDER_SITE_OTHER): Payer: Medicare PPO | Admitting: Ophthalmology

## 2011-05-04 DIAGNOSIS — H35039 Hypertensive retinopathy, unspecified eye: Secondary | ICD-10-CM

## 2011-05-04 DIAGNOSIS — E11319 Type 2 diabetes mellitus with unspecified diabetic retinopathy without macular edema: Secondary | ICD-10-CM

## 2011-05-04 DIAGNOSIS — I10 Essential (primary) hypertension: Secondary | ICD-10-CM

## 2011-05-04 DIAGNOSIS — H43819 Vitreous degeneration, unspecified eye: Secondary | ICD-10-CM

## 2011-05-04 DIAGNOSIS — E1139 Type 2 diabetes mellitus with other diabetic ophthalmic complication: Secondary | ICD-10-CM

## 2011-05-04 DIAGNOSIS — H2510 Age-related nuclear cataract, unspecified eye: Secondary | ICD-10-CM

## 2011-05-04 DIAGNOSIS — H3581 Retinal edema: Secondary | ICD-10-CM

## 2011-05-04 DIAGNOSIS — E1165 Type 2 diabetes mellitus with hyperglycemia: Secondary | ICD-10-CM

## 2011-05-14 ENCOUNTER — Ambulatory Visit (INDEPENDENT_AMBULATORY_CARE_PROVIDER_SITE_OTHER): Payer: Medicare PPO | Admitting: Ophthalmology

## 2011-05-14 DIAGNOSIS — H3581 Retinal edema: Secondary | ICD-10-CM

## 2011-09-13 ENCOUNTER — Ambulatory Visit (INDEPENDENT_AMBULATORY_CARE_PROVIDER_SITE_OTHER): Payer: Medicare PPO | Admitting: Ophthalmology

## 2011-11-22 DIAGNOSIS — Z9289 Personal history of other medical treatment: Secondary | ICD-10-CM

## 2011-11-22 HISTORY — DX: Personal history of other medical treatment: Z92.89

## 2012-07-08 ENCOUNTER — Other Ambulatory Visit: Payer: Self-pay

## 2012-07-08 MED ORDER — METOPROLOL SUCCINATE ER 50 MG PO TB24: 50.0000 mg | ORAL_TABLET | Freq: Every day | ORAL | Status: DC

## 2012-07-08 NOTE — Telephone Encounter (Signed)
Rx was sent to pharmacy electronically. 

## 2012-11-27 ENCOUNTER — Ambulatory Visit (INDEPENDENT_AMBULATORY_CARE_PROVIDER_SITE_OTHER): Payer: Self-pay | Admitting: Ophthalmology

## 2012-12-09 ENCOUNTER — Other Ambulatory Visit: Payer: Self-pay | Admitting: *Deleted

## 2012-12-09 MED ORDER — METOPROLOL SUCCINATE ER 50 MG PO TB24: 50.0000 mg | ORAL_TABLET | Freq: Every day | ORAL | Status: DC

## 2012-12-09 NOTE — Telephone Encounter (Signed)
Rx was sent to pharmacy electronically. 

## 2012-12-18 ENCOUNTER — Encounter: Payer: Self-pay | Admitting: Cardiovascular Disease

## 2012-12-18 ENCOUNTER — Encounter: Payer: Self-pay | Admitting: *Deleted

## 2012-12-19 ENCOUNTER — Ambulatory Visit: Payer: Medicare PPO | Admitting: Cardiovascular Disease

## 2012-12-29 ENCOUNTER — Ambulatory Visit (INDEPENDENT_AMBULATORY_CARE_PROVIDER_SITE_OTHER): Payer: Medicare PPO | Admitting: Cardiovascular Disease

## 2012-12-29 ENCOUNTER — Encounter: Payer: Self-pay | Admitting: Cardiovascular Disease

## 2012-12-29 VITALS — BP 150/70 | HR 86 | Ht 63.0 in | Wt 128.0 lb

## 2012-12-29 DIAGNOSIS — E785 Hyperlipidemia, unspecified: Secondary | ICD-10-CM | POA: Insufficient documentation

## 2012-12-29 DIAGNOSIS — E119 Type 2 diabetes mellitus without complications: Secondary | ICD-10-CM

## 2012-12-29 DIAGNOSIS — I10 Essential (primary) hypertension: Secondary | ICD-10-CM

## 2012-12-29 DIAGNOSIS — I2510 Atherosclerotic heart disease of native coronary artery without angina pectoris: Secondary | ICD-10-CM | POA: Insufficient documentation

## 2012-12-29 NOTE — Progress Notes (Signed)
Patient ID: Laura Ferguson, female   DOB: 1932/09/25, 79 y.o.   MRN: [id number]     HPI: Laura Ferguson is a 79 y.o. female who presents to the office for one year cardiology evaluation.  Ms. is now 78 years old. In April 2009 she underwent CABG revascularization surgery by Dr. Dorris Fetch in the LIMA graft was placed to the LAD, a vein to the intermediate, sequential vein to the OM one and one 2, and sequential vein to the acute margin the distal right cardiac artery. Additional problems include hypertension, hyperlipidemia, type 2 diabetes mellitus.  Since I last saw her, she has gotten remarried. Her husband is 78 years old, 6 years or younger.  Over the past year, Laura Ferguson continues to feel well. She remains active. She denies recurrent anginal symptoms. She denies PND or orthopnea. At times she does note some sinus drainage.  Past Medical History  Diagnosis Date  . 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 CAD (coronary artery disease)   . Hypertension   . Hyperlipidemia   . Type 2 diabetes mellitus   . History of nuclear stress test 11/22/2011    bruce myoview; normal pattern of perfusio; post-stress EF 76%; low risk scan    Past Surgical History  Procedure Laterality Date  . Transthoracic echocardiogram  02/13/2010    EF =>55%, vigorous contraction EF 65%; LA mild-mod dilated; IV normal diameter - normal CVP; trace MR; mild TR; trace AV regurg  . Cardiac catheterization  05/07/2007    EF 55%, focal mild hypocontractility in mid-distal inferior wall & mid posterolateral wall; severe multivessel CAD - susequent CABGx6 (Dr. Bishop Limbo)  . Coronary artery bypass graft  05/09/2007    LIMA to LAD, veing to intermediate; SVG to OM1 & OM2; SVG to acute marginal & distal RCA (Dr. Dorris Fetch)    Allergies  Allergen Reactions  . Shellfish Allergy     Current Outpatient Prescriptions  Medication Sig Dispense  Refill  . aspirin 81 MG tablet Take 81 mg by mouth daily.      Marland Kitchen glipiZIDE-metformin (METAGLIP) 5-500 MG per tablet Take 1 tablet by mouth 2 (two) times daily before a meal.      . insulin aspart (NOVOLOG) 100 UNIT/ML injection Inject 16 Units into the skin once.       . metoprolol succinate (TOPROL-XL) 50 MG 24 hr tablet Take 1 tablet (50 mg total) by mouth daily. Take with or immediately following a meal.  30 tablet  0  . Multiple Vitamin (MULTIVITAMIN) capsule Take 1 capsule by mouth daily.      . rosuvastatin (CRESTOR) 10 MG tablet Take 10 mg by mouth daily.      . Tetrahydrozoline HCl (EYE DROPS OP) Apply to eye at bedtime and may repeat dose one time if needed.      . valsartan (DIOVAN) 80 MG tablet Take 80 mg by mouth daily.       No current facility-administered medications for this visit.    History   Social History  . Marital Status: Widowed    Spouse Name: N/A    Number of Children: 7  . Years of Education: N/A   Occupational History  . Not on file.   Social History Main Topics  . Smoking status: Former Smoker    Types: Cigarettes  . Smokeless tobacco: Not on  file     Comment: quit 1960's  smoked very lightly.  . Alcohol Use: No  . Drug Use: Not on file  . Sexual Activity: Not on file   Other Topics Concern  . Not on file   Social History Narrative  . No narrative on file    Family History  Problem Relation Age of Onset  . Cancer Mother   . Diabetes Sister   . Cancer Child    Socially, she had been widowed and has 7 children, one deceased, 7 grandchildren of which 2 were professional and a palpable players including Laura Ferguson who played for the  [employer] and Laura Ferguson.  ROS is negative for fevers, chills or night sweats.  She denies skin changes. She denies change in vision. Ears no change in hearing. There is no lymphadenopathy. She denies cough or wheezing. At times she does note sinus drainage. She denies palpitations. She denies preceding  presyncope. There is no PND or orthopnea. There is no anginal symptoms. She denies nausea vomiting or diarrhea. She denies change in bowel bladder habits or blood in stool or urine. She denies myalgias. She denies paresthesias. She denies leg swelling. She denies sleep difficulty. Other comprehensive 12 point system review is negative.  PE BP 150/70  Pulse 86  Ht 5\' 3"  (1.6 m)  Wt 128 lb (58.06 kg)  BMI 22.68 kg/m2  Repeat blood pressure was 130/70 when taken by me. General: Alert, oriented, no distress.  Skin: normal turgor, no rashes HEENT: Normocephalic, atraumatic. Pupils round and reactive; sclera anicteric;no lid lag.  Nose without nasal septal hypertrophy Mouth/Parynx benign; Mallinpatti scale 2 Neck: No JVD, no carotid briuts Chest wall: No musculoskeletal tenderness to palpation Lungs: clear to ausculatation and percussion; no wheezing or rales Heart: RRR, s1 s2 normal  Back: No CVA tenderness the Abdomen: soft, nontender; no hepatosplenomehaly, BS+; abdominal aorta nontender and not dilated by palpation. Pulses 2+ Extremities: no clubbing cyanosis or edema, Homan's sign negative  Neurologic: grossly nonfocal Psychologic: normal affect and mood.  ECG: Sinus rhythm a 86; normal intervals.  LABS:  BMET    Component Value Date/Time   NA 141 05/15/2007 0520   K 4.0 05/15/2007 0520   CL 109 05/15/2007 0520   CO2 25 05/15/2007 0520   GLUCOSE 134* 05/15/2007 0520   BUN 17 DELTA CHECK NOTED 05/15/2007 0520   CREATININE 1.05 05/15/2007 0520   CALCIUM 8.8 05/15/2007 0520   GFRNONAA 52* 05/15/2007 0520   GFRAA  Value: >60        The eGFR has been calculated using the MDRD equation. This calculation has not been validated in all clinical 05/15/2007 0520     Hepatic Function Panel     Component Value Date/Time   PROT 6.3 05/07/2007 1653   ALBUMIN 3.3* 05/07/2007 1653   AST 15 05/07/2007 1653   ALT 16 05/07/2007 1653   ALKPHOS 55 05/07/2007 1653   BILITOT 0.4 05/07/2007 1653      CBC    Component Value Date/Time   WBC 5.1 05/15/2007 0520   RBC 3.09* 05/15/2007 0520   HGB 9.7* 05/15/2007 0520   HCT 28.3* 05/15/2007 0520   PLT 156 05/15/2007 0520   MCV 91.5 05/15/2007 0520   MCHC 34.3 05/15/2007 0520   RDW 15.3 05/15/2007 0520     BNP No results found for this basename: probnp    Lipid Panel     Component Value Date/Time   CHOL  Value: 186  ATP III CLASSIFICATION:  <200     mg/dL   Desirable  161-096  mg/dL   Borderline High  >=045    mg/dL   High 04/23/8117 1478   TRIG 31 05/07/2007 1652   HDL 95 05/07/2007 1652   CHOLHDL 2.0 05/07/2007 1652   VLDL 6 05/07/2007 1652   LDLCALC  Value: 85        Total Cholesterol/HDL:CHD Risk Coronary Heart Disease Risk Table                     Men   Women  1/2 Average Risk   3.4   3.3 05/07/2007 1652     RADIOLOGY:problems arise. No results found.    ASSESSMENT AND PLAN: Ms. Loralei Radcliffe continues to do well now 5-1/2 years status post CBG revascularization surgery after she was found to have severe multivessel CAD. Her last nuclear perfusion study November 2013 showed normal perfusion without scar or ischemia. An echo Doppler study in January 2012 showed vigorous LV contractility with Grade I diastolic dysfunction. She tells me Dr. Loma Sender in rechecking her laboratory very soon. Unless these be forwarded to our office for my review. Presently her blood pressure is well controlled. She remains asymptomatic on current therapy. Once laboratories reviewed adjustments will be made if necessary to medical regimen. I will see her in one year for cardiology reevaluation or sooner if problems arise.     Lennette Bihari, MD, [facility]  12/29/2012 4:30 PM

## 2012-12-29 NOTE — Patient Instructions (Signed)
Your physician recommends that you schedule a follow-up appointment in: 1 YEAR. No changes were made today in your therapy. 

## 2013-01-12 ENCOUNTER — Other Ambulatory Visit: Payer: Self-pay | Admitting: *Deleted

## 2013-01-12 MED ORDER — METOPROLOL SUCCINATE ER 50 MG PO TB24: 50.0000 mg | ORAL_TABLET | Freq: Every day | ORAL | Status: DC

## 2013-04-24 ENCOUNTER — Telehealth: Payer: Self-pay

## 2013-04-24 MED ORDER — METOPROLOL SUCCINATE ER 50 MG PO TB24: 50.0000 mg | ORAL_TABLET | Freq: Every day | ORAL | Status: DC

## 2013-04-24 NOTE — Telephone Encounter (Signed)
Rx was sent to pharmacy electronically. 

## 2014-01-14 ENCOUNTER — Ambulatory Visit (INDEPENDENT_AMBULATORY_CARE_PROVIDER_SITE_OTHER): Payer: Medicare PPO | Admitting: Cardiovascular Disease

## 2014-01-14 ENCOUNTER — Encounter: Payer: Self-pay | Admitting: Cardiovascular Disease

## 2014-01-14 VITALS — BP 150/80 | HR 69 | Ht 65.0 in | Wt 124.8 lb

## 2014-01-14 DIAGNOSIS — I2510 Atherosclerotic heart disease of native coronary artery without angina pectoris: Secondary | ICD-10-CM

## 2014-01-14 DIAGNOSIS — I10 Essential (primary) hypertension: Secondary | ICD-10-CM

## 2014-01-14 DIAGNOSIS — E1165 Type 2 diabetes mellitus with hyperglycemia: Secondary | ICD-10-CM

## 2014-01-14 DIAGNOSIS — E785 Hyperlipidemia, unspecified: Secondary | ICD-10-CM

## 2014-01-14 NOTE — Patient Instructions (Signed)
Your physician has requested that you have en exercise stress myoview November 2016. For further information please visit [email or web address]. Please follow instruction sheet, as given.  Your physician recommends that you schedule a follow-up appointment in: One year.

## 2014-01-14 NOTE — Progress Notes (Signed)
Patient ID: Laura Ferguson, female   DOB: [birth year], 80 y.o.   MRN: [id number]     HPI: Laura Ferguson is a 80 y.o. female who presents to the office for one year cardiology evaluation.  She is followed by Dr. Chase Caller, for primary care.  Laura Ferguson underwent CABG revascularization surgery by Dr. Roxan Hockey in April 2009 for severe multivessel CAD, at which time a  LIMA graft was placed to the LAD, a vein to the intermediate, sequential vein to the OM 1-2, and sequential vein to the acute margin the distal RCA.  Subsequently, she has continued to do well and has denied recurrent anginal symptomatology.  An echo Doppler study in January 2012 showed vigorous LV function with grade 1 diastolic dysfunction.  Her last nuclear stress test was in November 2013 which was normal without scar or ischemia.  Over the past year, she has remained very active.  She works as a Building control surveyor.  She denies any change in exercise tolerance.  She denies chest pain or shortness of breath.  She tells me she recently had complete set of blood work by Dr. Laurian Brim and was told that her labs were good.  She does not know the specifics concerning her laboratory values.    Additional problems include hypertension, hyperlipidemia, type 2 diabetes mellitus.  She tells me that over the past year, she had started to develop some episodes of lightheadedness on valsartan 80 mg and apparently Dr. Hardin Negus reduce this dose to just 40 mg daily.  I'll aerated Crestor 10 mg for hyperlipidemia.  She is diabetic on glipizide/metformin 5/500 in addition to NovoLog insulin.    Past Medical History  Diagnosis Date  . 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 CAD (coronary artery disease)   . Hypertension   . Hyperlipidemia   . Type 2 diabetes mellitus   . History of nuclear stress test 11/22/2011    bruce myoview; normal pattern of perfusio; post-stress  EF 76%; low risk scan    Past Surgical History  Procedure Laterality Date  . Transthoracic echocardiogram  02/13/2010    EF =>55%, vigorous contraction EF 65%; LA mild-mod dilated; IV normal diameter - normal CVP; trace MR; mild TR; trace AV regurg  . Cardiac catheterization  05/07/2007    EF 55%, focal mild hypocontractility in mid-distal inferior wall & mid posterolateral wall; severe multivessel CAD - susequent CABGx6 (Dr. Corky Downs)  . Coronary artery bypass graft  05/09/2007    LIMA to LAD, veing to intermediate; SVG to OM1 & OM2; SVG to acute marginal & distal RCA (Dr. Roxan Hockey)    Allergies  Allergen Reactions  . Shellfish Allergy     Current Outpatient Prescriptions  Medication Sig Dispense Refill  . aspirin 81 MG tablet Take 81 mg by mouth daily.    Marland Kitchen glipiZIDE-metformin (METAGLIP) 5-500 MG per tablet Take 1 tablet by mouth 2 (two) times daily before a meal.    . metoprolol succinate (TOPROL-XL) 50 MG 24 hr tablet Take 1 tablet (50 mg total) by mouth daily. Take with or immediately following a meal. 30 tablet 8  . Multiple Vitamin (MULTIVITAMIN) capsule Take 1 capsule by mouth daily.    . rosuvastatin (CRESTOR) 10 MG tablet Take 10 mg by mouth daily.    . Tetrahydrozoline HCl (EYE DROPS OP) Apply to eye at bedtime and  may repeat dose one time if needed.    . valsartan (DIOVAN) 80 MG tablet Take 80 mg by mouth daily.    . insulin aspart (NOVOLOG) 100 UNIT/ML injection Inject 35 Units into the skin once.      No current facility-administered medications for this visit.    History   Social History  . Marital Status: Widowed    Spouse Name: N/A    Number of Children: 7  . Years of Education: N/A   Occupational History  . Not on file.   Social History Main Topics  . Smoking status: Former Smoker    Types: Cigarettes  . Smokeless tobacco: Not on file     Comment: quit 1960's  smoked very lightly.  . Alcohol Use: No  . Drug Use: Not on file  . Sexual Activity: Not  on file   Other Topics Concern  . Not on file   Social History Narrative   Socially, she had been widowed and has 7 children, one deceased, 7 grandchildren of which 2 were professional a football players including Berna Gitto who played for the  [employer] and [employer].  Last year she was remarried.  Her husband is 6 years younger than she is.   Family History  Problem Relation Age of Onset  . Cancer Mother   . Diabetes Sister   . Cancer Child    ROS General: Negative; No fevers, chills, or night sweats;  HEENT: Negative; No changes in vision or hearing, sinus congestion, difficulty swallowing Pulmonary: Negative; No cough, wheezing, shortness of breath, hemoptysis Cardiovascular: Negative; No chest pain, presyncope, syncope, palpitations GI: Negative; No nausea, vomiting, diarrhea, or abdominal pain GU: Negative; No dysuria, hematuria, or difficulty voiding Musculoskeletal: Negative; no myalgias, joint pain, or weakness Hematologic/Oncology: Negative; no easy bruising, bleeding Endocrine: Positive for diabetes mellitus; no heat/cold intolerance;  Neuro: Negative; no changes in balance, headaches Skin: Negative; No rashes or skin lesions Psychiatric: Negative; No behavioral problems, depression Sleep: Negative; No snoring, daytime sleepiness, hypersomnolence, bruxism, restless legs, hypnogognic hallucinations, no cataplexy Other comprehensive 14 point system review is negative.   PE BP 150/80 mmHg  Pulse 69  Ht 5' 5"  (1.651 m)  Wt 124 lb 12.8 oz (56.609 kg)  BMI 20.77 kg/m2  Repeat blood pressure was 130/70 when taken by me. General: Alert, oriented, no distress.  Skin: normal turgor, no rashes HEENT: Normocephalic, atraumatic. Pupils round and reactive; sclera anicteric;no lid lag.  Nose without nasal septal hypertrophy Mouth/Parynx benign; Mallinpatti scale 2 Neck: No JVD, no carotid briuts Chest wall: No musculoskeletal tenderness to palpation Lungs: clear to  ausculatation and percussion; no wheezing or rales Heart: RRR, s1 s2 normal, no S3 or S4 gallop.  Faint 1/6 systolic murmur. Back: No CVA tenderness  Abdomen: soft, nontender; no hepatosplenomehaly, BS+; abdominal aorta nontender and not dilated by palpation. Pulses 2+ Extremities: no clubbing cyanosis or edema, Homan's sign negative  Neurologic: grossly nonfocal; cranial nerves normal Psychologic: normal affect and mood.   ECG (independently read by me): Normal sinus rhythm at 69 bpm.  Intervals normal.  Nonspecific T changes.  Prior December 2014 ECG: Sinus rhythm a 86; normal intervals.  LABS:  BMET    Component Value Date/Time   NA 141 05/15/2007 0520   K 4.0 05/15/2007 0520   CL 109 05/15/2007 0520   CO2 25 05/15/2007 0520   GLUCOSE 134* 05/15/2007 0520   BUN 17 DELTA CHECK NOTED 05/15/2007 0520   CREATININE 1.05 05/15/2007 0520  CALCIUM 8.8 05/15/2007 0520   GFRNONAA 52* 05/15/2007 0520   GFRAA  05/15/2007 0520    >60        The eGFR has been calculated using the MDRD equation. This calculation has not been validated in all clinical     Hepatic Function Panel     Component Value Date/Time   PROT 6.3 05/07/2007 1653   ALBUMIN 3.3* 05/07/2007 1653   AST 15 05/07/2007 1653   ALT 16 05/07/2007 1653   ALKPHOS 55 05/07/2007 1653   BILITOT 0.4 05/07/2007 1653     CBC    Component Value Date/Time   WBC 5.1 05/15/2007 0520   RBC 3.09* 05/15/2007 0520   HGB 9.7* 05/15/2007 0520   HCT 28.3* 05/15/2007 0520   PLT 156 05/15/2007 0520   MCV 91.5 05/15/2007 0520   MCHC 34.3 05/15/2007 0520   RDW 15.3 05/15/2007 0520     BNP No results found for: PROBNP  Lipid Panel     Component Value Date/Time   CHOL  05/07/2007 1652    186        ATP III CLASSIFICATION:  <200     mg/dL   Desirable  200-239  mg/dL   Borderline High  >=240    mg/dL   High   TRIG 31 05/07/2007 1652   HDL 95 05/07/2007 1652   CHOLHDL 2.0 05/07/2007 1652   VLDL 6 05/07/2007 1652    LDLCALC  05/07/2007 1652    85        Total Cholesterol/HDL:CHD Risk Coronary Heart Disease Risk Table                     Men   Women  1/2 Average Risk   3.4   3.3     RADIOLOGY:problems arise. No results found.    ASSESSMENT AND PLAN: Laura Ferguson is 80 years old and over 6-1/2 years since her CABG revascularization surgery after she was found to have severe multivessel CAD. Her last nuclear perfusion study November 2013 showed normal perfusion without scar or ischemia.  Her blood pressure today was mildly elevated at 150/80.  I am not certain of her renal function.  She apparently is on a reduced dose of valsartan, which had been 80 mg but apparently is now only 40 mg.  I tried to call Dr. Ladoris Gene office today, [place], but his office was closed and I will try to obtain the results of the blood work.  She states, however, that on the higher dose of valsartan.  She did note some episodes of dizziness.  Her ECG remained stable at a heart rate of 69 bpm with non-specific T changes.  She is on Crestor with target LDL less than 70 in this patient with established card artery disease.  In the past, her glucose and hemoglobin A1c's have been elevated on her current regimen but I do not know the results of the recent laboratory.  Particularly in this diabetic female, target blood pressure should be less than 975 systolic.  Her last nuclear study was November 2013.  In November 2016.  I will schedule her for a follow-up nuclear perfusion study to assess continued graft patency, scar/ischemia and will see her in follow-up at that time.  Time spent: 25 minutes   Troy Sine, MD, [facility]  01/14/2014 4:20 PM

## 2014-01-21 ENCOUNTER — Encounter: Payer: Self-pay | Admitting: Cardiovascular Disease

## 2014-03-18 ENCOUNTER — Other Ambulatory Visit: Payer: Self-pay

## 2014-03-18 MED ORDER — METOPROLOL SUCCINATE ER 50 MG PO TB24: 50.0000 mg | ORAL_TABLET | Freq: Every day | ORAL | Status: DC

## 2014-03-18 NOTE — Telephone Encounter (Signed)
Rx(s) sent to pharmacy electronically.  

## 2014-12-05 ENCOUNTER — Encounter (HOSPITAL_COMMUNITY): Payer: Self-pay | Admitting: Emergency Medicine

## 2014-12-05 ENCOUNTER — Emergency Department (HOSPITAL_COMMUNITY)
Admission: EM | Admit: 2014-12-05 | Discharge: 2014-12-05 | Disposition: A | Discharge status: Home / Self Care | Payer: Medicare HMO | Attending: Emergency Medicine | Admitting: Emergency Medicine

## 2014-12-05 DIAGNOSIS — Z79899 Other long term (current) drug therapy: Secondary | ICD-10-CM | POA: Insufficient documentation

## 2014-12-05 DIAGNOSIS — R42 Dizziness and giddiness: Secondary | ICD-10-CM | POA: Diagnosis not present

## 2014-12-05 DIAGNOSIS — Z87891 Personal history of nicotine dependence: Secondary | ICD-10-CM | POA: Insufficient documentation

## 2014-12-05 DIAGNOSIS — E119 Type 2 diabetes mellitus without complications: Secondary | ICD-10-CM | POA: Insufficient documentation

## 2014-12-05 DIAGNOSIS — I2510 Atherosclerotic heart disease of native coronary artery without angina pectoris: Secondary | ICD-10-CM | POA: Insufficient documentation

## 2014-12-05 DIAGNOSIS — Z951 Presence of aortocoronary bypass graft: Secondary | ICD-10-CM | POA: Insufficient documentation

## 2014-12-05 DIAGNOSIS — H9313 Tinnitus, bilateral: Secondary | ICD-10-CM | POA: Insufficient documentation

## 2014-12-05 DIAGNOSIS — E785 Hyperlipidemia, unspecified: Secondary | ICD-10-CM | POA: Insufficient documentation

## 2014-12-05 DIAGNOSIS — I10 Essential (primary) hypertension: Secondary | ICD-10-CM | POA: Diagnosis not present

## 2014-12-05 DIAGNOSIS — Z7982 Long term (current) use of aspirin: Secondary | ICD-10-CM | POA: Insufficient documentation

## 2014-12-05 DIAGNOSIS — Z9889 Other specified postprocedural states: Secondary | ICD-10-CM | POA: Insufficient documentation

## 2014-12-05 LAB — CBG MONITORING, ED: Glucose-Capillary: 341 mg/dL — ABNORMAL HIGH (ref 65–99)

## 2014-12-05 MED ORDER — MECLIZINE HCL 25 MG PO TABS: 25.0000 mg | ORAL_TABLET | Freq: Three times a day (TID) | ORAL | Status: DC | PRN

## 2014-12-05 MED ORDER — MECLIZINE HCL 25 MG PO TABS
25.0000 mg | ORAL_TABLET | Freq: Once | ORAL | Status: AC
  Administered 2014-12-05: 25 mg via ORAL
  Filled 2014-12-05: qty 1

## 2014-12-05 NOTE — ED Notes (Signed)
Pt. Stated, when I lay down Im really dizzy, and my balance has been off.  This started Friday night.

## 2014-12-05 NOTE — Discharge Instructions (Signed)
Benign Positional Vertigo Vertigo is the feeling that you or your surroundings are moving when they are not. Benign positional vertigo is the most common form of vertigo. The cause of this condition is not serious (is benign). This condition is triggered by certain movements and positions (is positional). This condition can be dangerous if it occurs while you are doing something that could endanger you or others, such as driving.  CAUSES In many cases, the cause of this condition is not known. It may be caused by a disturbance in an area of the inner ear that helps your brain to sense movement and balance. This disturbance can be caused by a viral infection (labyrinthitis), head injury, or repetitive motion. RISK FACTORS This condition is more likely to develop in:  Women.  People who are 50 years of age or older. SYMPTOMS Symptoms of this condition usually happen when you move your head or your eyes in different directions. Symptoms may start suddenly, and they usually last for less than a minute. Symptoms may include:  Loss of balance and falling.  Feeling like you are spinning or moving.  Feeling like your surroundings are spinning or moving.  Nausea and vomiting.  Blurred vision.  Dizziness.  Involuntary eye movement (nystagmus). Symptoms can be mild and cause only slight annoyance, or they can be severe and interfere with daily life. Episodes of benign positional vertigo may return (recur) over time, and they may be triggered by certain movements. Symptoms may improve over time. DIAGNOSIS This condition is usually diagnosed by medical history and a physical exam of the head, neck, and ears. You may be referred to a health care provider who specializes in ear, nose, and throat (ENT) problems (otolaryngologist) or a provider who specializes in disorders of the nervous system (neurologist). You may have additional testing, including:  MRI.  A CT scan.  Eye movement tests. Your  health care provider may ask you to change positions quickly while he or she watches you for symptoms of benign positional vertigo, such as nystagmus. Eye movement may be tested with an electronystagmogram (ENG), caloric stimulation, the Dix-Hallpike test, or the roll test.  An electroencephalogram (EEG). This records electrical activity in your brain.  Hearing tests. TREATMENT Usually, your health care provider will treat this by moving your head in specific positions to adjust your inner ear back to normal. Surgery may be needed in severe cases, but this is rare. In some cases, benign positional vertigo may resolve on its own in 2-4 weeks. HOME CARE INSTRUCTIONS Safety  Move slowly.Avoid sudden body or head movements.  Avoid driving.  Avoid operating heavy machinery.  Avoid doing any tasks that would be dangerous to you or others if a vertigo episode would occur.  If you have trouble walking or keeping your balance, try using a cane for stability. If you feel dizzy or unstable, sit down right away.  Return to your normal activities as told by your health care provider. Ask your health care provider what activities are safe for you. General Instructions  Take over-the-counter and prescription medicines only as told by your health care provider.  Avoid certain positions or movements as told by your health care provider.  Drink enough fluid to keep your urine clear or pale yellow.  Keep all follow-up visits as told by your health care provider. This is important. SEEK MEDICAL CARE IF:  You have a fever.  Your condition gets worse or you develop new symptoms.  Your family or friends   notice any behavioral changes.  Your nausea or vomiting gets worse.  You have numbness or a "pins and needles" sensation. SEEK IMMEDIATE MEDICAL CARE IF:  You have difficulty speaking or moving.  You are always dizzy.  You faint.  You develop severe headaches.  You have weakness in your  legs or arms.  You have changes in your hearing or vision.  You develop a stiff neck.  You develop sensitivity to light.   This information is not intended to replace advice given to you by your health care provider. Make sure you discuss any questions you have with your health care provider.   Document Released: 10/09/2005 Document Revised: 09/22/2014 Document Reviewed: 04/26/2014 Elsevier Interactive Patient Education 2016 [employer]  

## 2014-12-05 NOTE — ED Notes (Signed)
MD at bedside. 

## 2014-12-05 NOTE — ED Provider Notes (Signed)
CSN: [id number]     Arrival date & time 12/05/14  1158 History   First MD Initiated Contact with Patient 12/05/14 1234     Chief Complaint  Patient presents with  . Dizziness     (Consider location/radiation/quality/duration/timing/severity/associated sxs/prior Treatment) HPI Patient reports dizziness that started mild on Friday. She reports a spinning quality. She reports she is more dizzy when she is lying down and at that time she feels that she is spinning. No associated headache. No associated nausea or vomiting. No associated neurologic dysfunction. No fever no chills or recent illness. No chest pain or shortness of breath. She reports she frequently experiences ringing in her ears. She states she was once treated with antibiotics for it but it never made any improvement. Past Medical History  Diagnosis Date  . 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 CAD (coronary artery disease)   . Hypertension   . Hyperlipidemia   . Type 2 diabetes mellitus (HCC)   . History of nuclear stress test 11/22/2011    bruce myoview; normal pattern of perfusio; post-stress EF 76%; low risk scan   Past Surgical History  Procedure Laterality Date  . Transthoracic echocardiogram  02/13/2010    EF =>55%, vigorous contraction EF 65%; LA mild-mod dilated; IV normal diameter - normal CVP; trace MR; mild TR; trace AV regurg  . Cardiac catheterization  05/07/2007    EF 55%, focal mild hypocontractility in mid-distal inferior wall & mid posterolateral wall; severe multivessel CAD - susequent CABGx6 (Dr. Bishop Limbo. Kelly)  . Coronary artery bypass graft  05/09/2007    LIMA to LAD, veing to intermediate; SVG to OM1 & OM2; SVG to acute marginal & distal RCA (Dr. Dorris FetchHendrickson)   Family History  Problem Relation Age of Onset  . Cancer Mother   . Diabetes Sister   . Cancer Child    Social History  Substance Use Topics  . Smoking status:  Former Smoker    Types: Cigarettes  . Smokeless tobacco: None     Comment: quit 1960's  smoked very lightly.  . Alcohol Use: No   OB History    No data available     Review of Systems 10 Systems reviewed and are negative for acute change except as noted in the HPI.    Allergies  Review of patient's allergies indicates no known allergies.  Home Medications   Prior to Admission medications   Medication Sig Start Date End Date Taking? Authorizing Provider  aspirin 81 MG tablet Take 81 mg by mouth daily.   Yes Historical Provider, MD  glipiZIDE-metformin (METAGLIP) 5-500 MG per tablet Take 1 tablet by mouth 2 (two) times daily before a meal.   Yes Historical Provider, MD  LEVEMIR FLEXTOUCH 100 UNIT/ML Pen Inject 35 Units into the muscle daily as needed (Only uses if sugar is high).  09/15/14  Yes Historical Provider, MD  metFORMIN (GLUCOPHAGE) 1000 MG tablet Take 1 tablet by mouth daily. 09/25/14  Yes Historical Provider, MD  metoprolol succinate (TOPROL-XL) 50 MG 24 hr tablet Take 1 tablet (50 mg total) by mouth daily. Take with or immediately following a meal. 03/18/14  Yes Lennette Biharihomas A Kelly, MD  Multiple Vitamin (MULTIVITAMIN) capsule Take 1 capsule by mouth daily.   Yes Historical Provider, MD  Polyethyl Glycol-Propyl Glycol (SYSTANE OP) Apply 2 drops to eye as needed.   Yes Historical  Provider, MD  rosuvastatin (CRESTOR) 10 MG tablet Take 10 mg by mouth daily.   Yes Historical Provider, MD  TRAVATAN Z 0.004 % SOLN ophthalmic solution Place 1 drop into both eyes at bedtime. 10/14/14  Yes Historical Provider, MD  valsartan (DIOVAN) 80 MG tablet Take 40 mg by mouth daily.    Yes Historical Provider, MD  meclizine (ANTIVERT) 25 MG tablet Take 1 tablet (25 mg total) by mouth 3 (three) times daily as needed for dizziness. 12/05/14   Arby Barrette, MD   BP 145/66 mmHg  Pulse 74  Temp(Src) 98 F (36.7 C) (Oral)  Resp 21  SpO2 100% Physical Exam  Constitutional: She is oriented to person,  place, and time. She appears well-developed and well-nourished.  HENT:  Head: Normocephalic and atraumatic.  Right Ear: External ear normal.  Left Ear: External ear normal.  Nose: Nose normal.  Mouth/Throat: No oropharyngeal exudate.  Some cerumen in left ear canal but no cerumen impaction. No erythema or TM bulging.  Eyes: EOM are normal. Pupils are equal, round, and reactive to light.  Neck: Neck supple.  Cardiovascular: Normal rate, regular rhythm, normal heart sounds and intact distal pulses.   Pulmonary/Chest: Effort normal and breath sounds normal.  Abdominal: Soft. Bowel sounds are normal. She exhibits no distension. There is no tenderness.  Musculoskeletal: Normal range of motion. She exhibits no edema.  Neurological: She is alert and oriented to person, place, and time. She has normal strength. No cranial nerve deficit. She exhibits normal muscle tone. Coordination normal. GCS eye subscore is 4. GCS verbal subscore is 5. GCS motor subscore is 6.  Normal cerebellar examination with intact finger-nose examination. Normal heel shin examination. Positive Dix-Hallpike maneuver. Patient experienced vertigo in the supine position and right lateral nystagmus and upright position.  Skin: Skin is warm, dry and intact.  Psychiatric: She has a normal mood and affect.    ED Course  Procedures (including critical care time) Labs Review Labs Reviewed  CBG MONITORING, ED - Abnormal; Notable for the following:    Glucose-Capillary 341 (*)    All other components within normal limits    Imaging Review No results found. I have personally reviewed and evaluated these images and lab results as part of my medical decision-making.   EKG Interpretation   Date/Time:  Sunday December 05 2014 12:22:22 EST Ventricular Rate:  72 PR Interval:  128 QRS Duration: 80 QT Interval:  388 QTC Calculation: 424 R Axis:   29 Text Interpretation:  Normal sinus rhythm Low voltage QRS Nonspecific T  wave  abnormality Abnormal ECG agree. no STEMI. no change from previous  Confirmed by Donnald Garre, MD, Le[facility] Conners [phone]) on 12/05/2014 2:54:00 PM      MDM   Final diagnoses:  Vertigo   Patient findings consistent with benign positional vertigo. Spinning quality dizziness is reproducible with position change with no associated neurologic symptoms. Patient mental status is clear. Instructions are given for signs and symptoms to return. She will use meclizine as needed and follow-up with family physician.    Arby Barrette, MD 12/05/14 [phone]

## 2015-02-28 ENCOUNTER — Other Ambulatory Visit: Payer: Self-pay | Admitting: *Deleted

## 2015-02-28 MED ORDER — METOPROLOL SUCCINATE ER 50 MG PO TB24: 50.0000 mg | ORAL_TABLET | Freq: Every day | ORAL | Status: DC

## 2015-03-07 ENCOUNTER — Ambulatory Visit: Payer: Medicare PPO | Admitting: Primary Care

## 2015-03-15 ENCOUNTER — Ambulatory Visit: Payer: Medicare PPO | Admitting: Primary Care

## 2015-03-16 ENCOUNTER — Ambulatory Visit: Payer: Medicare HMO | Admitting: Primary Care

## 2015-03-21 ENCOUNTER — Encounter: Payer: Self-pay | Admitting: Cardiovascular Disease

## 2015-03-21 ENCOUNTER — Ambulatory Visit (INDEPENDENT_AMBULATORY_CARE_PROVIDER_SITE_OTHER): Payer: Medicare HMO | Admitting: Cardiovascular Disease

## 2015-03-21 VITALS — BP 126/80 | HR 71 | Ht 65.0 in | Wt 115.0 lb

## 2015-03-21 DIAGNOSIS — I10 Essential (primary) hypertension: Secondary | ICD-10-CM | POA: Diagnosis not present

## 2015-03-21 DIAGNOSIS — E119 Type 2 diabetes mellitus without complications: Secondary | ICD-10-CM

## 2015-03-21 DIAGNOSIS — E785 Hyperlipidemia, unspecified: Secondary | ICD-10-CM | POA: Diagnosis not present

## 2015-03-21 DIAGNOSIS — I25810 Atherosclerosis of coronary artery bypass graft(s) without angina pectoris: Secondary | ICD-10-CM

## 2015-03-21 MED ORDER — PANTOPRAZOLE SODIUM 40 MG PO TBEC: 40.0000 mg | DELAYED_RELEASE_TABLET | Freq: Every day | ORAL | Status: DC

## 2015-03-21 NOTE — Patient Instructions (Addendum)
Your physician has recommended you make the following change in your medication:   1.) increase the valsartan to 1 tablet (80 mg)  2.) start new prescription for pantoprazole.  Your physician has requested that you have a lexiscan myoview. For further information please visit [email or web address]. Please follow instruction sheet, as given.  Your physician recommends that you return for lab work.  Your physician recommends that you schedule a follow-up appointment in: 2 months with Dr Tresa EndoKelly.

## 2015-03-22 ENCOUNTER — Encounter: Payer: Self-pay | Admitting: Cardiovascular Disease

## 2015-03-22 LAB — CBC
HCT: 40.8 % (ref 36.0–46.0)
Hemoglobin: 13.2 g/dL (ref 12.0–15.0)
MCH: 29.9 pg (ref 26.0–34.0)
MCHC: 32.4 g/dL (ref 30.0–36.0)
MCV: 92.3 fL (ref 78.0–100.0)
MPV: 10.9 fL (ref 8.6–12.4)
PLATELETS: 223 10*3/uL (ref 150–400)
RBC: 4.42 MIL/uL (ref 3.87–5.11)
RDW: 14.7 % (ref 11.5–15.5)
WBC: 4.2 10*3/uL (ref 4.0–10.5)

## 2015-03-22 LAB — LIPID PANEL
CHOL/HDL RATIO: 3.2 ratio (ref <=5.0)
CHOLESTEROL: 246 mg/dL — AB (ref 125–200)
HDL: 78 mg/dL (ref >=46)
LDL Cholesterol: 147 mg/dL — ABNORMAL HIGH (ref <130)
Triglycerides: 106 mg/dL (ref <150)
VLDL: 21 mg/dL (ref <30)

## 2015-03-22 LAB — COMPREHENSIVE METABOLIC PANEL
ALT: 11 U/L (ref 6–29)
AST: 12 U/L (ref 10–35)
Albumin: 4 g/dL (ref 3.6–5.1)
Alkaline Phosphatase: 80 U/L (ref 33–130)
BUN: 17 mg/dL (ref 7–25)
CHLORIDE: 102 mmol/L (ref 98–110)
CO2: 26 mmol/L (ref 20–31)
Calcium: 10.1 mg/dL (ref 8.6–10.4)
Creat: 1 mg/dL — ABNORMAL HIGH (ref 0.60–0.88)
Glucose, Bld: 386 mg/dL — ABNORMAL HIGH (ref 65–99)
POTASSIUM: 4.5 mmol/L (ref 3.5–5.3)
Sodium: 138 mmol/L (ref 135–146)
TOTAL PROTEIN: 7.3 g/dL (ref 6.1–8.1)
Total Bilirubin: 0.5 mg/dL (ref 0.2–1.2)

## 2015-03-22 LAB — TSH: TSH: 0.84 mIU/L
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
## 2015-03-25 ENCOUNTER — Encounter: Payer: Self-pay | Admitting: Primary Care

## 2015-03-25 ENCOUNTER — Ambulatory Visit (INDEPENDENT_AMBULATORY_CARE_PROVIDER_SITE_OTHER): Payer: Medicare HMO | Admitting: Primary Care

## 2015-03-25 ENCOUNTER — Telehealth: Payer: Self-pay | Admitting: Primary Care

## 2015-03-25 VITALS — BP 150/80 | HR 74 | Temp 98.3°F | Ht 63.5 in | Wt 111.1 lb

## 2015-03-25 DIAGNOSIS — R42 Dizziness and giddiness: Secondary | ICD-10-CM | POA: Diagnosis not present

## 2015-03-25 DIAGNOSIS — E119 Type 2 diabetes mellitus without complications: Secondary | ICD-10-CM | POA: Diagnosis not present

## 2015-03-25 DIAGNOSIS — I10 Essential (primary) hypertension: Secondary | ICD-10-CM

## 2015-03-25 DIAGNOSIS — Z794 Long term (current) use of insulin: Secondary | ICD-10-CM

## 2015-03-25 DIAGNOSIS — E785 Hyperlipidemia, unspecified: Secondary | ICD-10-CM

## 2015-03-25 LAB — HEMOGLOBIN A1C
HEMOGLOBIN A1C: 14.3 % — AB (ref <5.7)
Mean Plasma Glucose: 364 mg/dL — ABNORMAL HIGH (ref <117)

## 2015-03-25 MED ORDER — LEVEMIR FLEXTOUCH 100 UNIT/ML ~~LOC~~ SOPN: 25.0000 [IU] | PEN_INJECTOR | Freq: Every day | SUBCUTANEOUS | Status: DC

## 2015-03-25 MED ORDER — METFORMIN HCL 1000 MG PO TABS: 1000.0000 mg | ORAL_TABLET | Freq: Two times a day (BID) | ORAL | Status: DC

## 2015-03-25 NOTE — Progress Notes (Signed)
Pre visit review using our clinic review tool, if applicable. No additional management support is needed unless otherwise documented below in the visit note. 

## 2015-03-25 NOTE — Telephone Encounter (Signed)
Please notify Laura Ferguson that her records are ready for pick up at her convenience.

## 2015-03-25 NOTE — Progress Notes (Signed)
Subjective:    Patient ID: Laura Ferguson, female    DOB: Feb 11, 1932, 82 y.o.   MRN: [id number]  HPI  [name] is an 82 year old female who presents today to establish care and discuss the problems mentioned below. Will review old records.  1) Dizziness: Present for the past 2 months. She has uncontrolled diabetes and HTN. She's managed on meclizine three times daily. Denies syncope. No complaints of dizziness today.  2) Type 2 Diabetes: Diagnosed 25 years ago. Currently managed on Metformin 1000 mg daily, Metaglip 5-500 mg BID, and Levemir 35 units PRN? She has not been using her Levemir and has not injected it in several months. She checks her blood sugars twice daily and will get readings ranging from mid 200's to 300's highest level of 500. Her last A1C was drawn on 03/21/15 which was 14.3. She endorses compliance to her oral medications. Does have occasional numbness to her feet. She reports to feel fine and doesn't understand the need for Levemir.   3) CAD/Essential Hypertension: Currently managed on Toprol XL 50 mg and valsartan 80 mg. She is currently following with cardiology who recently increased her valsartan from 40 mg to 80 mg. BP is slightly above goal today. She is due for a cardiac stress test later this month. Open heart surgery 10 years ago. She is also managed on aspirin and Crestor takes this sparingly. Denies chest pain, shortness of breath, headaches.  4) Hyperlipidemia: Currently managed on Crestor 10 mg. She is non compliant with her Crestor and has not had this medication in over three weeks. Again, she reports feeling well and does not understand the need for these medications.   Review of Systems  Constitutional: Negative for unexpected weight change.  Respiratory: Negative for shortness of breath.   Cardiovascular: Negative for chest pain.  Musculoskeletal: Negative for myalgias.  Neurological: Negative for dizziness and headaches.       Past Medical History    Diagnosis Date  . 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 CAD (coronary artery disease)   . Hypertension   . Hyperlipidemia   . Type 2 diabetes mellitus ([facility])   . History of nuclear stress test 11/22/2011    bruce myoview; normal pattern of perfusio; post-stress EF 76%; low risk scan    Social History   Social History  . Marital Status: Widowed    Spouse Name: N/A  . Number of Children: 7  . Years of Education: N/A   Occupational History  . Not on file.   Social History Main Topics  . Smoking status: Never Smoker   . Smokeless tobacco: Not on file     Comment: quit 1960's  smoked very lightly.  . Alcohol Use: No  . Drug Use: No  . Sexual Activity: Not on file   Other Topics Concern  . Not on file   Social History Narrative   Married.   Retired.   Works as a Engineer, structural.    Enjoys helping other.     Past Surgical History  Procedure Laterality Date  . Transthoracic echocardiogram  02/13/2010    EF =>55%, vigorous contraction EF 65%; LA mild-mod dilated; IV normal diameter - normal CVP; trace MR; mild TR; trace AV regurg  . Cardiac catheterization  05/07/2007    EF 55%, focal mild hypocontractility in mid-distal inferior wall &  mid posterolateral wall; severe multivessel CAD - susequent CABGx6 (Dr. Bishop Limbo. [place])  . Coronary artery bypass graft  05/09/2007    LIMA to LAD, veing to intermediate; SVG to OM1 & OM2; SVG to acute marginal & distal RCA (Dr. Dorris FetchHendrickson)    Family History  Problem Relation Age of Onset  . Cancer Mother   . Diabetes Sister   . Cancer Child     No Known Allergies  Current Outpatient Prescriptions on File Prior to Visit  Medication Sig Dispense Refill  . aspirin 81 MG tablet Take 81 mg by mouth daily.    Marland Kitchen. glipiZIDE-metformin (METAGLIP) 5-500 MG per tablet Take 1 tablet by mouth 2 (two) times daily before a meal.    . meclizine (ANTIVERT) 25 MG tablet Take 1  tablet (25 mg total) by mouth 3 (three) times daily as needed for dizziness. 30 tablet 0  . metoprolol succinate (TOPROL-XL) 50 MG 24 hr tablet Take 1 tablet (50 mg total) by mouth daily. Take with or immediately following a meal. 30 tablet 0  . Multiple Vitamin (MULTIVITAMIN) capsule Take 1 capsule by mouth daily.    . pantoprazole (PROTONIX) 40 MG tablet Take 1 tablet (40 mg total) by mouth daily. 30 tablet 2  . Polyethyl Glycol-Propyl Glycol (SYSTANE OP) Apply 2 drops to eye as needed.    . rosuvastatin (CRESTOR) 10 MG tablet Take 10 mg by mouth daily.    . TRAVATAN Z 0.004 % SOLN ophthalmic solution Place 1 drop into both eyes at bedtime.    . valsartan (DIOVAN) 80 MG tablet Take 80 mg by mouth daily.     No current facility-administered medications on file prior to visit.    BP 150/80 mmHg  Pulse 74  Temp(Src) 98.3 F (36.8 C) (Oral)  Ht 5' 3.5" (1.613 m)  Wt 111 lb 1.9 oz (50.404 kg)  BMI 19.37 kg/m2  SpO2 98%    Objective:   Physical Exam  Constitutional: She appears well-nourished.  Neck: Neck supple.  Cardiovascular: Normal rate and regular rhythm.   Pulmonary/Chest: Effort normal and breath sounds normal.  Skin: Skin is warm and dry.  Psychiatric: She has a normal mood and affect.          Assessment & Plan:  >45 minutes spent face to face with patient, >50% spent counseling or coordinating care.

## 2015-03-25 NOTE — Patient Instructions (Addendum)
Diabetes:  Glipizide-metformin (metaglip) 5-500 mg. Take 1 tablet by mouth twice daily with meals.  Metformin 1000 mg. Take 1 tablet by mouth twice daily.  Levemir (insulin). Inject 25 units into the skin every night at bedtime.  Cholesterol:  Crestor 10 mg. Take 1 tablet by mouth every night at bedtime.   You must take these medications as directed in order to prevent complications in the future.  Schedule a follow up appointment in 3 months for re-evaluation of diabetes and cholesterol. Make sure you come to this appointment without eating anything for 4 hours.  It was a pleasure to meet you today! Please don't hesitate to call me with any questions. Welcome to [facility]!

## 2015-03-26 DIAGNOSIS — R42 Dizziness and giddiness: Secondary | ICD-10-CM | POA: Insufficient documentation

## 2015-03-26 NOTE — Assessment & Plan Note (Addendum)
Slightly above goal in clinic today. Recent increase to her valsartan to 80 mg. Also managed on Toprol XL 50. Denies headaches, chest pain. Given age and recent increase in meds will continue to monitor. Discussed low salt diet.

## 2015-03-26 NOTE — Assessment & Plan Note (Signed)
Uncontrolled and non compliant to meds. A1C of 14.3. Confused on med regimen and oblivious to effects of diabetes. Spent a long time going through her meds and ensuring she is complaint. Clarification provided today. Start Levemir as prescribed.  Emphasized diet. Repeat A1C in 3 months.

## 2015-03-26 NOTE — Assessment & Plan Note (Signed)
Ongoing for months. Could be related to uncontrolled diabetes and HTN. Will help her gain control of both and continue to monitor.

## 2015-03-26 NOTE — Assessment & Plan Note (Signed)
Following with cardiology, due for stress test later this month. Non compliant to aspirin and statin. Spent a long time providing education today in regards to medication compliance and effects of non compliance.  She verbalized understanding.

## 2015-03-26 NOTE — Assessment & Plan Note (Signed)
Non compliant to Crestor and aspirin. Discussed risk of non compliance. Repeat lipids in 3 months.

## 2015-03-28 NOTE — Telephone Encounter (Signed)
Tried to call patient on Friday afternoon on 03/25/2015 and morning of 03/28/2015 but could not leave message for patient due to voicemail box full.

## 2015-03-29 NOTE — Telephone Encounter (Signed)
Patient have acute appointment on 03/30/2015. Will give records to patient then.

## 2015-03-30 ENCOUNTER — Telehealth: Payer: Self-pay | Admitting: Primary Care

## 2015-03-30 ENCOUNTER — Ambulatory Visit: Payer: Medicare HMO | Admitting: Primary Care

## 2015-03-30 NOTE — Telephone Encounter (Signed)
No follow-up required. Thanks!

## 2015-03-30 NOTE — Telephone Encounter (Signed)
Patient did not come for their scheduled appointment today for uti.  Please let me know if the patient needs to be contacted immediately for follow up or if no follow up is necessary.

## 2015-04-01 ENCOUNTER — Telehealth (HOSPITAL_COMMUNITY): Payer: Self-pay

## 2015-04-01 ENCOUNTER — Telehealth: Payer: Self-pay

## 2015-04-01 ENCOUNTER — Ambulatory Visit (INDEPENDENT_AMBULATORY_CARE_PROVIDER_SITE_OTHER): Payer: Medicare HMO | Admitting: Primary Care

## 2015-04-01 ENCOUNTER — Other Ambulatory Visit: Payer: Self-pay | Admitting: Cardiovascular Disease

## 2015-04-01 VITALS — BP 138/76 | HR 75 | Temp 97.5°F | Ht 63.5 in | Wt 113.0 lb

## 2015-04-01 DIAGNOSIS — N390 Urinary tract infection, site not specified: Secondary | ICD-10-CM | POA: Diagnosis not present

## 2015-04-01 LAB — POC URINALSYSI DIPSTICK (AUTOMATED)
Bilirubin, UA: NEGATIVE
Ketones, UA: NEGATIVE
Nitrite, UA: NEGATIVE
RBC UA: NEGATIVE
SPEC GRAV UA: 1.025
UROBILINOGEN UA: NEGATIVE
pH, UA: 5.5

## 2015-04-01 MED ORDER — METOPROLOL SUCCINATE ER 50 MG PO TB24: 50.0000 mg | ORAL_TABLET | Freq: Every day | ORAL | Status: DC

## 2015-04-01 MED ORDER — CEPHALEXIN 500 MG PO CAPS: 500.0000 mg | ORAL_CAPSULE | Freq: Two times a day (BID) | ORAL | Status: DC

## 2015-04-01 NOTE — Addendum Note (Signed)
Addended by: Tawnya CrookSAMBATH, Josejulian Tarango on: 04/01/2015 11:22 AM   Modules accepted: Orders, SmartSet

## 2015-04-01 NOTE — Telephone Encounter (Signed)
Encounter complete. 

## 2015-04-01 NOTE — Telephone Encounter (Signed)
Dr Landry DykeKelly's office called wanting Laura ReelKate Clark NP to be aware of lab done on 03/21/15; BS was 386. (Dr Tresa EndoKelly out of office). Pt saw Laura ReelKate Clark NP on 03/25/15 to establish care and discuss diabetes. Please note 386 BS.

## 2015-04-01 NOTE — Telephone Encounter (Signed)
Rx request sent to pharmacy.  

## 2015-04-01 NOTE — Progress Notes (Signed)
Pre visit review using our clinic review tool, if applicable. No additional management support is needed unless otherwise documented below in the visit note. 

## 2015-04-01 NOTE — Progress Notes (Signed)
Subjective:    Patient ID: Laura Ferguson, female    DOB: [birth year], 82 y.o.   MRN: [id number]  HPI  Laura Ferguson is an 82 year old female with a history of uncontrolled diabetes who presents today with a chief complaint of urinary frequency. She also reports dysuria, foul smelling urine and flank pain. Her symptoms have been present over 1 week. Denies vaginal discharge, fevers, and vaginal itching. She's not taken anything OTC for her symptoms. She's been drinking sodas recently, little water intake.   Review of Systems  Constitutional: Negative for fever, chills and fatigue.  Gastrointestinal: Negative for nausea and abdominal pain.  Genitourinary: Positive for dysuria, frequency, flank pain and pelvic pain. Negative for hematuria and vaginal discharge.       Past Medical History  Diagnosis Date  . 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 CAD (coronary artery disease)   . Hypertension   . Hyperlipidemia   . Type 2 diabetes mellitus ([facility])   . History of nuclear stress test 11/22/2011    bruce myoview; normal pattern of perfusio; post-stress EF 76%; low risk scan    Social History   Social History  . Marital Status: Widowed    Spouse Name: N/A  . Number of Children: 7  . Years of Education: N/A   Occupational History  . Not on file.   Social History Main Topics  . Smoking status: Never Smoker   . Smokeless tobacco: Not on file     Comment: quit 1960's  smoked very lightly.  . Alcohol Use: No  . Drug Use: No  . Sexual Activity: Not on file   Other Topics Concern  . Not on file   Social History Narrative   Married.   Retired.   Works as a Engineer, structural.    Enjoys helping other.     Past Surgical History  Procedure Laterality Date  . Transthoracic echocardiogram  02/13/2010    EF =>55%, vigorous contraction EF 65%; LA mild-mod dilated; IV normal diameter - normal CVP; trace MR; mild TR;  trace AV regurg  . Cardiac catheterization  05/07/2007    EF 55%, focal mild hypocontractility in mid-distal inferior wall & mid posterolateral wall; severe multivessel CAD - susequent CABGx6 (Dr. Bishop Limbo)  . Coronary artery bypass graft  05/09/2007    LIMA to LAD, veing to intermediate; SVG to OM1 & OM2; SVG to acute marginal & distal RCA (Dr. Dorris Fetch)    Family History  Problem Relation Age of Onset  . Cancer Mother   . Diabetes Sister   . Cancer Child     No Known Allergies  Current Outpatient Prescriptions on File Prior to Visit  Medication Sig Dispense Refill  . aspirin 81 MG tablet Take 81 mg by mouth daily.    Marland Kitchen glipiZIDE-metformin (METAGLIP) 5-500 MG per tablet Take 1 tablet by mouth 2 (two) times daily before a meal.    . LEVEMIR FLEXTOUCH 100 UNIT/ML Pen Inject 25 Units into the skin at bedtime. 15 mL 11  . meclizine (ANTIVERT) 25 MG tablet Take 1 tablet (25 mg total) by mouth 3 (three) times daily as needed for dizziness. 30 tablet 0  . metFORMIN (GLUCOPHAGE) 1000 MG tablet Take 1 tablet (1,000 mg total) by mouth 2 (two) times daily with a meal. 60 tablet 11  . metoprolol succinate (TOPROL-XL) 50  MG 24 hr tablet Take 1 tablet (50 mg total) by mouth daily. Take with or immediately following a meal. 30 tablet 0  . Multiple Vitamin (MULTIVITAMIN) capsule Take 1 capsule by mouth daily.    . pantoprazole (PROTONIX) 40 MG tablet Take 1 tablet (40 mg total) by mouth daily. 30 tablet 2  . Polyethyl Glycol-Propyl Glycol (SYSTANE OP) Apply 2 drops to eye as needed.    . rosuvastatin (CRESTOR) 10 MG tablet Take 10 mg by mouth daily.    . TRAVATAN Z 0.004 % SOLN ophthalmic solution Place 1 drop into both eyes at bedtime.    . valsartan (DIOVAN) 80 MG tablet Take 80 mg by mouth daily.     No current facility-administered medications on file prior to visit.    BP 138/76 mmHg  Pulse 75  Temp(Src) 97.5 F (36.4 C) (Oral)  Ht 5' 3.5" (1.613 m)  Wt 113 lb (51.256 kg)  BMI 19.70  kg/m2  SpO2 99%    Objective:   Physical Exam  Constitutional: She appears well-nourished.  Neck: Neck supple.  Cardiovascular: Normal rate and regular rhythm.   Pulmonary/Chest: Effort normal and breath sounds normal.  Abdominal: Soft. Normal appearance. There is no tenderness. There is no CVA tenderness.  Skin: Skin is warm and dry.          Assessment & Plan:  Urinary Tract Infection:  Urinary frequency, dysuria, pelvic pressure, foul smelling urine greater than 1 week. Drinking more sodas recently. Little water intake. Exam unremarkable. Does not appear acutely ill. No CVA tenderness. UA: 2+ leuks, no nitrites. 2+ glucose. Culture sent. RX for Cephalexin 500 BID x 7 days. Discussed importance of hydration with water, stop sodas.  Return precautions provided.

## 2015-04-01 NOTE — Patient Instructions (Signed)
Your urine shows infection that will need to be treated.  Start Cephalexin antibiotics for urinary tract infection. Take 1 capsule by mouth twice daily for 7 days.  Increase consumption of water to stay hydrated.  Please call me if no improvement in symptoms in 3-4 days.  It was a pleasure to see you today!  Urinary Tract Infection Urinary tract infections (UTIs) can develop anywhere along your urinary tract. Your urinary tract is your body's drainage system for removing wastes and extra water. Your urinary tract includes two kidneys, two ureters, a bladder, and a urethra. Your kidneys are a pair of bean-shaped organs. Each kidney is about the size of your fist. They are located below your ribs, one on each side of your spine. CAUSES Infections are caused by microbes, which are microscopic organisms, including fungi, viruses, and bacteria. These organisms are so small that they can only be seen through a microscope. Bacteria are the microbes that most commonly cause UTIs. SYMPTOMS  Symptoms of UTIs may vary by age and gender of the patient and by the location of the infection. Symptoms in young women typically include a frequent and intense urge to urinate and a painful, burning feeling in the bladder or urethra during urination. Older women and men are more likely to be tired, shaky, and weak and have muscle aches and abdominal pain. A fever may mean the infection is in your kidneys. Other symptoms of a kidney infection include pain in your back or sides below the ribs, nausea, and vomiting. DIAGNOSIS To diagnose a UTI, your caregiver will ask you about your symptoms. Your caregiver will also ask you to provide a urine sample. The urine sample will be tested for bacteria and white blood cells. White blood cells are made by your body to help fight infection. TREATMENT  Typically, UTIs can be treated with medication. Because most UTIs are caused by a bacterial infection, they usually can be treated  with the use of antibiotics. The choice of antibiotic and length of treatment depend on your symptoms and the type of bacteria causing your infection. HOME CARE INSTRUCTIONS  If you were prescribed antibiotics, take them exactly as your caregiver instructs you. Finish the medication even if you feel better after you have only taken some of the medication.  Drink enough water and fluids to keep your urine clear or pale yellow.  Avoid caffeine, tea, and carbonated beverages. They tend to irritate your bladder.  Empty your bladder often. Avoid holding urine for long periods of time.  Empty your bladder before and after sexual intercourse.  After a bowel movement, women should cleanse from front to back. Use each tissue only once. SEEK MEDICAL CARE IF:   You have back pain.  You develop a fever.  Your symptoms do not begin to resolve within 3 days. SEEK IMMEDIATE MEDICAL CARE IF:   You have severe back pain or lower abdominal pain.  You develop chills.  You have nausea or vomiting.  You have continued burning or discomfort with urination. MAKE SURE YOU:   Understand these instructions.  Will watch your condition.  Will get help right away if you are not doing well or get worse.   This information is not intended to replace advice given to you by your health care provider. Make sure you discuss any questions you have with your health care provider.   Document Released: 10/11/2004 Document Revised: 09/22/2014 Document Reviewed: 02/09/2011 Elsevier Interactive Patient Education [employer] Inc.

## 2015-04-01 NOTE — Telephone Encounter (Signed)
Appreciate the notification. I saw her on 03/25/15, checked an A1C and was 11.5. She had been non complaint with her diet and medication. We spent a long time discussing her medication regimen and proper diabetic diet. Will be seeing her for regular follow up.

## 2015-04-03 LAB — URINE CULTURE: Colony Count: >=100000

## 2015-04-04 ENCOUNTER — Telehealth: Payer: Self-pay | Admitting: *Deleted

## 2015-04-04 ENCOUNTER — Other Ambulatory Visit: Payer: Self-pay | Admitting: *Deleted

## 2015-04-04 ENCOUNTER — Encounter: Payer: Self-pay | Admitting: *Deleted

## 2015-04-04 DIAGNOSIS — E785 Hyperlipidemia, unspecified: Secondary | ICD-10-CM

## 2015-04-04 DIAGNOSIS — Z79899 Other long term (current) drug therapy: Secondary | ICD-10-CM
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
## 2015-04-04 NOTE — Progress Notes (Signed)
Labs were already discussed with patient's PCP last week. They were going to contact patient.

## 2015-04-04 NOTE — Telephone Encounter (Signed)
-----   Message from Lennette Biharihomas A Kelly, MD sent at 04/04/2015  8:34 AM EDT ----- Refer back  to primary MD or endocrine for DM management; HbA1c 14.3;  Increase crestor to 40 mg; f/u lipids; Cmet in 6-8 weeks

## 2015-04-06 ENCOUNTER — Ambulatory Visit (HOSPITAL_COMMUNITY)
Admission: RE | Admit: 2015-04-06 | Discharge: 2015-04-06 | Disposition: A | Discharge status: Home / Self Care | Payer: Medicare HMO | Source: Ambulatory Visit | Attending: Cardiovascular Disease | Admitting: Cardiovascular Disease

## 2015-04-06 DIAGNOSIS — Z8249 Family history of ischemic heart disease and other diseases of the circulatory system: Secondary | ICD-10-CM | POA: Diagnosis not present

## 2015-04-06 DIAGNOSIS — I10 Essential (primary) hypertension: Secondary | ICD-10-CM | POA: Diagnosis not present

## 2015-04-06 DIAGNOSIS — I25810 Atherosclerosis of coronary artery bypass graft(s) without angina pectoris: Secondary | ICD-10-CM | POA: Insufficient documentation

## 2015-04-06 DIAGNOSIS — E119 Type 2 diabetes mellitus without complications: Secondary | ICD-10-CM | POA: Diagnosis not present

## 2015-04-06 DIAGNOSIS — R42 Dizziness and giddiness: Secondary | ICD-10-CM | POA: Diagnosis not present

## 2015-04-06 DIAGNOSIS — R5383 Other fatigue: Secondary | ICD-10-CM | POA: Diagnosis not present

## 2015-04-06 LAB — MYOCARDIAL PERFUSION IMAGING
CHL CUP NUCLEAR SDS: 4
LV sys vol: 24 mL
LVDIAVOL: 63 mL (ref 46–106)
Peak HR: 100 {beats}/min
Rest HR: 62 {beats}/min
SRS: 7
SSS: 11
TID: 1.06

## 2015-04-06 MED ORDER — TECHNETIUM TC 99M SESTAMIBI GENERIC - CARDIOLITE
10.2000 | Freq: Once | INTRAVENOUS | Status: AC | PRN
Start: 2015-04-06 — End: 2015-04-06
  Administered 2015-04-06: 10.2 via INTRAVENOUS

## 2015-04-06 MED ORDER — REGADENOSON 0.4 MG/5ML IV SOLN
0.4000 mg | Freq: Once | INTRAVENOUS | Status: AC
  Administered 2015-04-06: 0.4 mg via INTRAVENOUS

## 2015-04-06 MED ORDER — TECHNETIUM TC 99M SESTAMIBI GENERIC - CARDIOLITE
31.7000 | Freq: Once | INTRAVENOUS | Status: AC | PRN
  Administered 2015-04-06: 31.7 via INTRAVENOUS

## 2015-04-11 ENCOUNTER — Encounter: Payer: Self-pay | Admitting: *Deleted

## 2015-04-12 ENCOUNTER — Telehealth: Payer: Self-pay

## 2015-04-12 NOTE — Telephone Encounter (Signed)
These are med she should be on:  Glipizide-metformin (metaglip) 5-500 mg. Take 1 tablet by mouth twice daily with meals.  Metformin 1000 mg. Take 1 tablet by mouth twice daily.  Levemir (insulin). Inject 25 units into the skin every night at bedtime.  No other glipizide Xl needed.. This would be a du[plicate of what she is already taking.

## 2015-04-12 NOTE — Telephone Encounter (Signed)
Pt established care with Mayra ReelKate Clark NP on 03/25/15; pt calling to get refill on glipizide xl 10 mg [facility]. Per AVS of 03/25/15 visit which pt does have; pt was advised to take glipizide=-metformin 5-500mg  one tab bid and metformin 1000 mg one tab bid. And levemir 25 units at hs. Pt still thinks she is supposed to take the glipizide xl 10 mg one tab bid, metformin 1000 mg one tab bid and levemir 20 units at hs. [facility] said pt last got glipizide xl 10 mg on 02/26/15 by Dr Loma Senderharles Phillips who is now deceased and his office is closed.[facility] has never filled the glipizide metformin 5-500 mg. Pt is out of glipizide. Mayra ReelKate Clark NP out of office.Please advise.pt request cb.

## 2015-04-13 NOTE — Telephone Encounter (Signed)
Called and spoken to patient. Patient still seem confused on the medications that she should be taking. Tried to explain to her what should be taking and if we need to refill. Patient still confused so asked patient to come in to see Jae DireKate so they can discuss and refill medication if needed.   FYI--schedule patient on Friday 04/15/2015.

## 2015-04-13 NOTE — Telephone Encounter (Signed)
Yes, patient is at 30 minutes slot and notified patient to bring all her medications.

## 2015-04-13 NOTE — Telephone Encounter (Signed)
Noted. We spent a long time discussing these meds during her last visit. I wrote specific instructions on her last AVS.  Johny Drillinghan, please ensure she's in a 30 minute slot and please ensure she brings ALL of her medications to this visit. Thanks

## 2015-04-15 ENCOUNTER — Encounter: Payer: Self-pay | Admitting: Primary Care

## 2015-04-15 ENCOUNTER — Ambulatory Visit (INDEPENDENT_AMBULATORY_CARE_PROVIDER_SITE_OTHER): Payer: Medicare HMO | Admitting: Primary Care

## 2015-04-15 VITALS — BP 124/78 | HR 77 | Temp 97.7°F | Ht 63.6 in | Wt 112.8 lb

## 2015-04-15 DIAGNOSIS — R42 Dizziness and giddiness: Secondary | ICD-10-CM

## 2015-04-15 DIAGNOSIS — E785 Hyperlipidemia, unspecified: Secondary | ICD-10-CM | POA: Diagnosis not present

## 2015-04-15 DIAGNOSIS — I10 Essential (primary) hypertension: Secondary | ICD-10-CM | POA: Diagnosis not present

## 2015-04-15 DIAGNOSIS — E119 Type 2 diabetes mellitus without complications: Secondary | ICD-10-CM

## 2015-04-15 MED ORDER — GLIPIZIDE ER 10 MG PO TB24: 10.0000 mg | ORAL_TABLET | Freq: Every day | ORAL | Status: DC

## 2015-04-15 NOTE — Assessment & Plan Note (Signed)
Improved today. Discussed importance to compliance. Continue current regimen.

## 2015-04-15 NOTE — Assessment & Plan Note (Signed)
Improved on meclizine. Also suspect improvement due to improvement in sugars.

## 2015-04-15 NOTE — Progress Notes (Signed)
Pre visit review using our clinic review tool, if applicable. No additional management support is needed unless otherwise documented below in the visit note. 

## 2015-04-15 NOTE — Assessment & Plan Note (Signed)
Endorses compliance to Crestor now. Continue same.  Repeat lipids at next visit.

## 2015-04-15 NOTE — Assessment & Plan Note (Signed)
Spent a long time today going over medications for diabetes and how to take. This visit she has an empty bottle of Glipizide XL for which she did not have last visit. Last visit she had an empty bottle of Metaglip for which she did not have today.  Re-ordered Glipizide Xl 10 mg today. Continue Metformin 1000 mg BID.  Will hold Levemir since sugars are running in 90's per patient without insulin.  All medications were typed up on AVS and discussed in great detail with patient. Patient verbalized understanding. Due for follow up and repeat A1C in June 2017.

## 2015-04-15 NOTE — Progress Notes (Signed)
Subjective:    Patient ID: Laura Ferguson, female    DOB: [birth year], 82 y.o.   MRN: [id number]  HPI  [name] is an 82 year old female who presents today to discuss medication management.  1) Type 2 Diabetes: Uncontrolled with A1C of 14.3 that we found from last visit. During her new patient visit she seemed very confused regarding her medication regimen, especially in regards to diabetes.   Currently managed on Levemir 20 units at bedtime, Glipizide XL 10 mg, and Metformin 1000 mg twice daily. Last visit she presented with a bottle for Metaglip 5-500 mg and did not have Glipizde XL 10 mg. Today she is without the Metaglip bottle and has an empty bottle of her Glipizide XL 10 mg. She's been using her Levemir sparingly. She's checking her sugars multiple times daily and is getting readings of 90 on average now without Levemir use.  Denies dizziness, falls, numbness/tingling, chest pain.  2) Hyperlipidemia: Currently managed on Crestor and aspirin. Last visit endorsed taking her Crestor sparingly. Since her last visit she's sarted taking this medication every night at bedtime consistently. She's not missed any doses.   3) Essential Hypertension: Currently managed on Valsartan 80 mg and Toprol XL 50 mg. She endorses compliance to both medications. BP stable today.  BP Readings from Last 3 Encounters:  04/15/15 124/78  04/01/15 138/76  03/25/15 150/80     Review of Systems  Respiratory: Negative for shortness of breath.   Cardiovascular: Negative for chest pain.  Neurological: Negative for dizziness, numbness and headaches.       Past Medical History  Diagnosis Date  . 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 CAD (coronary artery disease)   . Hypertension   . Hyperlipidemia   . Type 2 diabetes mellitus (HCC)   . History of nuclear stress test 11/22/2011    bruce myoview; normal pattern of perfusio;  post-stress EF 76%; low risk scan    Social History   Social History  . Marital Status: Widowed    Spouse Name: N/A  . Number of Children: 7  . Years of Education: N/A   Occupational History  . Not on file.   Social History Main Topics  . Smoking status: Never Smoker   . Smokeless tobacco: Not on file     Comment: quit 1960's  smoked very lightly.  . Alcohol Use: No  . Drug Use: No  . Sexual Activity: Not on file   Other Topics Concern  . Not on file   Social History Narrative   Married.   Retired.   Works as a Engineer, structural.    Enjoys helping other.     Past Surgical History  Procedure Laterality Date  . Transthoracic echocardiogram  02/13/2010    EF =>55%, vigorous contraction EF 65%; LA mild-mod dilated; IV normal diameter - normal CVP; trace MR; mild TR; trace AV regurg  . Cardiac catheterization  05/07/2007    EF 55%, focal mild hypocontractility in mid-distal inferior wall & mid posterolateral wall; severe multivessel CAD - susequent CABGx6 (Dr. Bishop Limbo)  . Coronary artery bypass graft  05/09/2007    LIMA to LAD, veing to intermediate; SVG to OM1 & OM2; SVG to acute marginal & distal RCA (Dr. Dorris Fetch)    Family History  Problem Relation Age of Onset  . Cancer Mother   .  Diabetes Sister   . Cancer Child     No Known Allergies  Current Outpatient Prescriptions on File Prior to Visit  Medication Sig Dispense Refill  . aspirin 81 MG tablet Take 81 mg by mouth daily.    . meclizine (ANTIVERT) 25 MG tablet Take 1 tablet (25 mg total) by mouth 3 (three) times daily as needed for dizziness. 30 tablet 0  . metFORMIN (GLUCOPHAGE) 1000 MG tablet Take 1 tablet (1,000 mg total) by mouth 2 (two) times daily with a meal. 60 tablet 11  . metoprolol succinate (TOPROL-XL) 50 MG 24 hr tablet Take 1 tablet (50 mg total) by mouth daily. Take with or immediately following a meal. 30 tablet 2  . Multiple Vitamin (MULTIVITAMIN) capsule Take 1 capsule by mouth daily.    .  pantoprazole (PROTONIX) 40 MG tablet Take 1 tablet (40 mg total) by mouth daily. 30 tablet 2  . Polyethyl Glycol-Propyl Glycol (SYSTANE OP) Apply 2 drops to eye as needed.    . rosuvastatin (CRESTOR) 10 MG tablet Take 10 mg by mouth daily.    . TRAVATAN Z 0.004 % SOLN ophthalmic solution Place 1 drop into both eyes at bedtime.    . valsartan (DIOVAN) 80 MG tablet Take 80 mg by mouth daily.     No current facility-administered medications on file prior to visit.    BP 124/78 mmHg  Pulse 77  Temp(Src) 97.7 F (36.5 C) (Oral)  Ht 5' 3.6" (1.615 m)  Wt 112 lb 12.8 oz (51.166 kg)  BMI 19.62 kg/m2  SpO2 98%    Objective:   Physical Exam  Constitutional: She is oriented to person, place, and time. She appears well-nourished.  Cardiovascular: Normal rate and regular rhythm.   Pulmonary/Chest: Effort normal and breath sounds normal.  Neurological: She is alert and oriented to person, place, and time.  Skin: Skin is warm and dry.          Assessment & Plan:

## 2015-04-15 NOTE — Patient Instructions (Addendum)
Diabetes Medications:  Glipizide XL 10 mg. Take 1 tablet by mouth every morning with breakfast for diabetes.  Metformin 1000 mg. Take 1 tablet by mouth twice daily for diabetes.  High Blood Pressure:  Metoprolol Succinate 50 mg. Take 1 tablet by mouth once daily after a meal for blood pressure.  Valsartan 80 mg. Take 1 tablet by mouth once daily for blood pressure.  Cholesterol:  Rosuvastatin 10 mg. Take 1 tablet by mouth every night at bedtime for cholesterol.  Eye Drops:  Travatan Z 0.004% eye drops. Instill 1 drop in both eyes at bedtime.  Acid Reflux:  Pantoprazole 40 mg tablets. Take 1 tablet by mouth daily for acid reflux.  Follow up in June 2017 as scheduled for re-evaluation of diabetes. We will retest the A1C for your diabetes.   Please don't hesitate to call me if you have any questions.  It was a pleasure to see you today!

## 2015-04-25 ENCOUNTER — Other Ambulatory Visit: Payer: Self-pay | Admitting: Primary Care

## 2015-04-25 DIAGNOSIS — Z794 Long term (current) use of insulin: Principal | ICD-10-CM

## 2015-04-25 DIAGNOSIS — I10 Essential (primary) hypertension: Secondary | ICD-10-CM

## 2015-04-25 DIAGNOSIS — E119 Type 2 diabetes mellitus without complications: Secondary | ICD-10-CM

## 2015-04-25 MED ORDER — VALSARTAN 80 MG PO TABS: 80.0000 mg | ORAL_TABLET | Freq: Every day | ORAL | Status: DC

## 2015-04-25 MED ORDER — METFORMIN HCL 1000 MG PO TABS: 1000.0000 mg | ORAL_TABLET | Freq: Two times a day (BID) | ORAL | Status: DC

## 2015-04-25 NOTE — Telephone Encounter (Signed)
Received fax refill request for   Metformin 1000 mg (this was prescribed on 03/25/2015 but pharmacy stated that they do not have it and to re-send)  Valsartan 80 mg (have not been prescribed yet by Jae DireKate)  Last seen on 04/15/2015. Follow up on 06/27/2015.

## 2015-05-10 ENCOUNTER — Other Ambulatory Visit: Payer: Self-pay | Admitting: Primary Care

## 2015-05-10 DIAGNOSIS — E785 Hyperlipidemia, unspecified: Secondary | ICD-10-CM

## 2015-05-10 MED ORDER — ROSUVASTATIN CALCIUM 10 MG PO TABS: 10.0000 mg | ORAL_TABLET | Freq: Every day | ORAL | Status: DC

## 2015-05-10 NOTE — Telephone Encounter (Signed)
Received faxed refill request for rosuvastatin (CRESTOR).  Medication have not been prescribed by Jae DireKate. Last seen on 04/15/2015. Follow up on 06/27/2015.

## 2015-05-16 ENCOUNTER — Ambulatory Visit (INDEPENDENT_AMBULATORY_CARE_PROVIDER_SITE_OTHER): Payer: Medicare HMO | Admitting: Primary Care

## 2015-05-16 VITALS — BP 124/78 | HR 76 | Temp 97.6°F | Ht 63.5 in | Wt 108.1 lb

## 2015-05-16 DIAGNOSIS — R600 Localized edema: Secondary | ICD-10-CM

## 2015-05-16 NOTE — Progress Notes (Signed)
Subjective:    Patient ID: Laura Ferguson, female    DOB: 1932/11/15, 82 y.o.   MRN: [id number]  HPI  Laura Ferguson is an 82 year old female who presents today with a chief complaint of lower extremity/ankle edema. This has been present for the past 3 weeks. She also has tightness with some discomfort to her feet. She does notice a cool feeling to her feet that has been present for several years; this is no change from normal. She has been eating a lot of salty foods for the past several weeks. She's kept her feet elevated at home with little improvement. Her feet are swollen in the morning and evenings. Denies recent injury or trauma and has not been on her feet for prolonged amounts of time.   Review of Systems  Respiratory: Negative for shortness of breath.   Cardiovascular: Positive for leg swelling. Negative for chest pain.  Genitourinary: Negative for difficulty urinating.  Neurological: Negative for dizziness.       Past Medical History  Diagnosis Date  . 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 CAD (coronary artery disease)   . Hypertension   . Hyperlipidemia   . Type 2 diabetes mellitus (HCC)   . History of nuclear stress test 11/22/2011    bruce myoview; normal pattern of perfusio; post-stress EF 76%; low risk scan     Social History   Social History  . Marital Status: Widowed    Spouse Name: N/A  . Number of Children: 7  . Years of Education: N/A   Occupational History  . Not on file.   Social History Main Topics  . Smoking status: Never Smoker   . Smokeless tobacco: Not on file     Comment: quit 1960's  smoked very lightly.  . Alcohol Use: No  . Drug Use: No  . Sexual Activity: Not on file   Other Topics Concern  . Not on file   Social History Narrative   Married.   Retired.   Works as a Engineer, structuralCaregiver.    Enjoys helping other.     Past Surgical History  Procedure Laterality  Date  . Transthoracic echocardiogram  02/13/2010    EF =>55%, vigorous contraction EF 65%; LA mild-mod dilated; IV normal diameter - normal CVP; trace MR; mild TR; trace AV regurg  . Cardiac catheterization  05/07/2007    EF 55%, focal mild hypocontractility in mid-distal inferior wall & mid posterolateral wall; severe multivessel CAD - susequent CABGx6 (Dr. Bishop Limbo. Kelly)  . Coronary artery bypass graft  05/09/2007    LIMA to LAD, veing to intermediate; SVG to OM1 & OM2; SVG to acute marginal & distal RCA (Dr. Dorris FetchHendrickson)    Family History  Problem Relation Age of Onset  . Cancer Mother   . Diabetes Sister   . Cancer Child     No Known Allergies  Current Outpatient Prescriptions on File Prior to Visit  Medication Sig Dispense Refill  . aspirin 81 MG tablet Take 81 mg by mouth daily.    Marland Kitchen. glipiZIDE (GLUCOTROL XL) 10 MG 24 hr tablet Take 1 tablet (10 mg total) by mouth daily with breakfast. 90 tablet 2  . meclizine (ANTIVERT) 25 MG tablet Take 1 tablet (25 mg total) by mouth 3 (three) times daily as needed for dizziness. 30 tablet 0  . metFORMIN (GLUCOPHAGE) 1000  MG tablet Take 1 tablet (1,000 mg total) by mouth 2 (two) times daily with a meal. 60 tablet 11  . metoprolol succinate (TOPROL-XL) 50 MG 24 hr tablet Take 1 tablet (50 mg total) by mouth daily. Take with or immediately following a meal. 30 tablet 2  . Multiple Vitamin (MULTIVITAMIN) capsule Take 1 capsule by mouth daily.    . pantoprazole (PROTONIX) 40 MG tablet Take 1 tablet (40 mg total) by mouth daily. 30 tablet 2  . Polyethyl Glycol-Propyl Glycol (SYSTANE OP) Apply 2 drops to eye as needed.    . rosuvastatin (CRESTOR) 10 MG tablet Take 1 tablet (10 mg total) by mouth daily. 90 tablet 2  . TRAVATAN Z 0.004 % SOLN ophthalmic solution Place 1 drop into both eyes at bedtime.    . valsartan (DIOVAN) 80 MG tablet Take 1 tablet (80 mg total) by mouth daily. 30 tablet 11   No current facility-administered medications on file prior to  visit.    BP 124/78 mmHg  Pulse 76  Temp(Src) 97.6 F (36.4 C) (Oral)  Ht 5' 3.5" (1.613 m)  Wt 108 lb 1.9 oz (49.043 kg)  BMI 18.85 kg/m2  SpO2 97%    Objective:   Physical Exam  Constitutional: She appears well-nourished.  Cardiovascular: Normal rate and regular rhythm.   Pulses:      Dorsalis pedis pulses are 2+ on the right side, and 2+ on the left side.       Posterior tibial pulses are 2+ on the right side, and 2+ on the left side.  Moderate swelling to feet bilaterally. No pitting. Slightly cool to touch.   Pulmonary/Chest: Effort normal and breath sounds normal.  Skin: Skin is warm and dry.          Assessment & Plan:  Lower extremity edema:  Located to both feet x several weeks. Endorses heavy intake of salty foods over past several weeks. History of HTN. Echo in 2014 unremarkable. EF of 76%. Good pedal pulses, no discoloration. Does not appear to be gout. Suspect edema from increase in salty foods. Information provided today regarding low salt diet.  Elevate extremities at night. Compression stockings. She is to call if no improvement.

## 2015-05-16 NOTE — Progress Notes (Signed)
Pre visit review using our clinic review tool, if applicable. No additional management support is needed unless otherwise documented below in the visit note. 

## 2015-05-16 NOTE — Patient Instructions (Signed)
Reduce your salt intake as this can cause swelling.  Continue to elevate your legs.  Obtain a pair of compression socks to help with circulation and swelling.   Please notify me if your feet turn cold, you notice increased swelling, or if no improvement in swelling in 1 month.  It was a pleasure to see you today!  Low-Sodium Eating Plan Sodium raises blood pressure and causes water to be held in the body. Getting less sodium from food will help lower your blood pressure, reduce any swelling, and protect your heart, liver, and kidneys. We get sodium by adding salt (sodium chloride) to food. Most of our sodium comes from canned, boxed, and frozen foods. Restaurant foods, fast foods, and pizza are also very high in sodium. Even if you take medicine to lower your blood pressure or to reduce fluid in your body, getting less sodium from your food is important. WHAT IS MY PLAN? Most people should limit their sodium intake to 2,300 mg a day. Your health care provider recommends that you limit your sodium intake to __________ a day.  WHAT DO I NEED TO KNOW ABOUT THIS EATING PLAN? For the low-sodium eating plan, you will follow these general guidelines:  Choose foods with a % Daily Value for sodium of less than 5% (as listed on the food label).   Use salt-free seasonings or herbs instead of table salt or sea salt.   Check with your health care provider or pharmacist before using salt substitutes.   Eat fresh foods.  Eat more vegetables and fruits.  Limit canned vegetables. If you do use them, rinse them well to decrease the sodium.   Limit cheese to 1 oz (28 g) per day.   Eat lower-sodium products, often labeled as "lower sodium" or "no salt added."  Avoid foods that contain monosodium glutamate (MSG). MSG is sometimes added to [place] food and some canned foods.  Check food labels (Nutrition Facts labels) on foods to learn how much sodium is in one serving.  Eat more home-cooked  food and less restaurant, buffet, and fast food.  When eating at a restaurant, ask that your food be prepared with less salt, or no salt if possible.  HOW DO I READ FOOD LABELS FOR SODIUM INFORMATION? The Nutrition Facts label lists the amount of sodium in one serving of the food. If you eat more than one serving, you must multiply the listed amount of sodium by the number of servings. Food labels may also identify foods as:  Sodium free--Less than 5 mg in a serving.  Very low sodium--35 mg or less in a serving.  Low sodium--140 mg or less in a serving.  Light in sodium--50% less sodium in a serving. For example, if a food that usually has 300 mg of sodium is changed to become light in sodium, it will have 150 mg of sodium.  Reduced sodium--25% less sodium in a serving. For example, if a food that usually has 400 mg of sodium is changed to reduced sodium, it will have 300 mg of sodium. WHAT FOODS CAN I EAT? Grains Low-sodium cereals, including oats, puffed wheat and rice, and shredded wheat cereals. Low-sodium crackers. Unsalted rice and pasta. Lower-sodium bread.  Vegetables Frozen or fresh vegetables. Low-sodium or reduced-sodium canned vegetables. Low-sodium or reduced-sodium tomato sauce and paste. Low-sodium or reduced-sodium tomato and vegetable juices.  Fruits Fresh, frozen, and canned fruit. Fruit juice.  Meat and Other Protein Products Low-sodium canned tuna and salmon. Fresh or frozen  meat, poultry, seafood, and fish. Lamb. Unsalted nuts. Dried beans, peas, and lentils without added salt. Unsalted canned beans. Homemade soups without salt. Eggs.  Dairy Milk. Soy milk. Ricotta cheese. Low-sodium or reduced-sodium cheeses. Yogurt.  Condiments Fresh and dried herbs and spices. Salt-free seasonings. Onion and garlic powders. Low-sodium varieties of mustard and ketchup. Fresh or refrigerated horseradish. Lemon juice.  Fats and Oils Reduced-sodium salad dressings. Unsalted  butter.  Other Unsalted popcorn and pretzels.  The items listed above may not be a complete list of recommended foods or beverages. Contact your dietitian for more options. WHAT FOODS ARE NOT RECOMMENDED? Grains Instant hot cereals. Bread stuffing, pancake, and biscuit mixes. Croutons. Seasoned rice or pasta mixes. Noodle soup cups. Boxed or frozen macaroni and cheese. Self-rising flour. Regular salted crackers. Vegetables Regular canned vegetables. Regular canned tomato sauce and paste. Regular tomato and vegetable juices. Frozen vegetables in sauces. Salted [place] fries. Olives. Rosita Fire. Relishes. Sauerkraut. Salsa. Meat and Other Protein Products Salted, canned, smoked, spiced, or pickled meats, seafood, or fish. Bacon, ham, sausage, hot dogs, corned beef, chipped beef, and packaged luncheon meats. Salt pork. Jerky. Pickled herring. Anchovies, regular canned tuna, and sardines. Salted nuts. Dairy Processed cheese and cheese spreads. Cheese curds. Blue cheese and cottage cheese. Buttermilk.  Condiments Onion and garlic salt, seasoned salt, table salt, and sea salt. Canned and packaged gravies. Worcestershire sauce. Tartar sauce. Barbecue sauce. Teriyaki sauce. Soy sauce, including reduced sodium. Steak sauce. Fish sauce. Oyster sauce. Cocktail sauce. Horseradish that you find on the shelf. Regular ketchup and mustard. Meat flavorings and tenderizers. Bouillon cubes. Hot sauce. Tabasco sauce. Marinades. Taco seasonings. Relishes. Fats and Oils Regular salad dressings. Salted butter. Margarine. Ghee. Bacon fat.  Other Potato and tortilla chips. Corn chips and puffs. Salted popcorn and pretzels. Canned or dried soups. Pizza. Frozen entrees and pot pies.  The items listed above may not be a complete list of foods and beverages to avoid. Contact your dietitian for more information.   This information is not intended to replace advice given to you by your health care provider. Make sure  you discuss any questions you have with your health care provider.   Document Released: 06/23/2001 Document Revised: 01/22/2014 Document Reviewed: 11/05/2012 Elsevier Interactive Patient Education [employer]

## 2015-05-31 ENCOUNTER — Telehealth: Payer: Self-pay | Admitting: Cardiovascular Disease

## 2015-05-31 ENCOUNTER — Telehealth: Payer: Self-pay | Admitting: Primary Care

## 2015-05-31 LAB — LIPID PANEL
CHOL/HDL RATIO: 2 ratio (ref <=5.0)
CHOLESTEROL: 147 mg/dL (ref 125–200)
HDL: 75 mg/dL (ref >=46)
LDL Cholesterol: 57 mg/dL (ref <130)
Triglycerides: 77 mg/dL (ref <150)
VLDL: 15 mg/dL (ref <30)

## 2015-05-31 LAB — COMPREHENSIVE METABOLIC PANEL
ALBUMIN: 3.9 g/dL (ref 3.6–5.1)
ALT: 14 U/L (ref 6–29)
AST: 12 U/L (ref 10–35)
Alkaline Phosphatase: 67 U/L (ref 33–130)
BILIRUBIN TOTAL: 0.6 mg/dL (ref 0.2–1.2)
BUN: 15 mg/dL (ref 7–25)
CALCIUM: 9.9 mg/dL (ref 8.6–10.4)
CO2: 25 mmol/L (ref 20–31)
CREATININE: 1.08 mg/dL — AB (ref 0.60–0.88)
Chloride: 100 mmol/L (ref 98–110)
Glucose, Bld: 413 mg/dL — ABNORMAL HIGH (ref 65–99)
Potassium: 4.2 mmol/L (ref 3.5–5.3)
SODIUM: 137 mmol/L (ref 135–146)
TOTAL PROTEIN: 6.7 g/dL (ref 6.1–8.1)

## 2015-05-31 NOTE — Telephone Encounter (Signed)
New message       Calling with an abnormal lab value

## 2015-05-31 NOTE — Telephone Encounter (Signed)
It appears, based off of recent labs, Laura Ferguson's blood sugars are uncontrolled. Please schedule her for an office visit to go over her current medications and home sugar readings.

## 2015-05-31 NOTE — Telephone Encounter (Signed)
Kim at [facility] with alert Glucose 413; blood drawn yesterday at 8:36AM.

## 2015-05-31 NOTE — Telephone Encounter (Signed)
Noted  

## 2015-05-31 NOTE — Telephone Encounter (Signed)
Pt was notified, I offered to an appt to come see you. She said would rather call back to make this appt.

## 2015-06-01 ENCOUNTER — Ambulatory Visit (INDEPENDENT_AMBULATORY_CARE_PROVIDER_SITE_OTHER): Payer: Medicare HMO | Admitting: Cardiovascular Disease

## 2015-06-01 ENCOUNTER — Encounter: Payer: Self-pay | Admitting: Cardiovascular Disease

## 2015-06-01 VITALS — BP 149/82 | HR 74 | Ht 64.0 in | Wt 109.4 lb

## 2015-06-01 DIAGNOSIS — I2510 Atherosclerotic heart disease of native coronary artery without angina pectoris: Secondary | ICD-10-CM

## 2015-06-01 DIAGNOSIS — I10 Essential (primary) hypertension: Secondary | ICD-10-CM | POA: Diagnosis not present

## 2015-06-01 DIAGNOSIS — M25473 Effusion, unspecified ankle: Secondary | ICD-10-CM | POA: Insufficient documentation

## 2015-06-01 DIAGNOSIS — E785 Hyperlipidemia, unspecified: Secondary | ICD-10-CM | POA: Diagnosis not present

## 2015-06-01 DIAGNOSIS — E1165 Type 2 diabetes mellitus with hyperglycemia: Secondary | ICD-10-CM

## 2015-06-01 DIAGNOSIS — M25472 Effusion, left ankle: Secondary | ICD-10-CM

## 2015-06-01 MED ORDER — VALSARTAN-HYDROCHLOROTHIAZIDE 80-12.5 MG PO TABS: 1.0000 | ORAL_TABLET | Freq: Every day | ORAL | Status: DC

## 2015-06-01 NOTE — Telephone Encounter (Signed)
Patient was seen in the office today by Dr Tresa EndoKelly. She states that her PCP did call her to come for appointment yesterday, however she declined to go.
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
## 2015-06-01 NOTE — Patient Instructions (Signed)
Your physician has recommended you make the following change in your medication:   1.) the valsartan has been changed to valsartan -hctz 80-12.5 a new prescription has been sent to your pharmacy. STOP the plain lorsartan 80 mg tablets.  Your physician wants you to follow-up in: 6 months or sooner if needed. You will receive a reminder letter in the mail two months in advance. If you don't receive a letter, please call our office to schedule the follow-up appointment.  Please call and see your PCP sooner than June 12th

## 2015-06-03 ENCOUNTER — Encounter: Payer: Self-pay | Admitting: Primary Care

## 2015-06-03 ENCOUNTER — Ambulatory Visit (INDEPENDENT_AMBULATORY_CARE_PROVIDER_SITE_OTHER): Payer: Medicare HMO | Admitting: Primary Care

## 2015-06-03 VITALS — BP 124/78 | HR 76 | Temp 97.7°F | Ht 63.5 in | Wt 106.8 lb

## 2015-06-03 DIAGNOSIS — E1165 Type 2 diabetes mellitus with hyperglycemia: Secondary | ICD-10-CM | POA: Diagnosis not present

## 2015-06-03 DIAGNOSIS — IMO0001 Reserved for inherently not codable concepts without codable children: Secondary | ICD-10-CM

## 2015-06-03 MED ORDER — INSULIN DETEMIR 100 UNIT/ML FLEXPEN: 10.0000 [IU] | PEN_INJECTOR | Freq: Every day | SUBCUTANEOUS | Status: DC

## 2015-06-03 NOTE — Progress Notes (Signed)
Pre visit review using our clinic review tool, if applicable. No additional management support is needed unless otherwise documented below in the visit note. 

## 2015-06-03 NOTE — Progress Notes (Signed)
Subjective:    Patient ID: Laura Ferguson, female    DOB: 1932-11-18, 82 y.o.   MRN: [id number]  HPI  Laura Ferguson is an 82 year old female who presents today for re-evaluation of type 2 diabetes. She was recently evaluated by her cardiologist who noted a blood sugar of 413 on labs that were drawn 4 days ago. Laura Ferguson has a history of non compliance to her diabetes medications and numerous visits have focused on compliance and organization of her medication.  She is currently managed on Glipizide XL 10 mg and Metformin 1000 mg BID. Last visit she endorsed home blood sugars of 90 on average. She is checking her sugars at home three times daily before meals and is getting readings in the 300's. She has a prescription for Levemir at home that was prescribed by her prior PCP. She has not been taking this as she thinks it drops her sugars. Last visit she endorsed blood sugar readings of 90 without insulin. She is compliant to the Glipizide XL 10 mg and Metformin 1000 mg BID.   Denies dizziness, numbness/tingling, fatigue.  Review of Systems  Constitutional: Negative for fatigue.  Respiratory: Negative for shortness of breath.   Cardiovascular: Negative for chest pain.  Neurological: Negative for dizziness and numbness.       Past Medical History  Diagnosis Date  . 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 CAD (coronary artery disease)   . Hypertension   . Hyperlipidemia   . Type 2 diabetes mellitus (HCC)   . History of nuclear stress test 11/22/2011    bruce myoview; normal pattern of perfusio; post-stress EF 76%; low risk scan     Social History   Social History  . Marital Status: Widowed    Spouse Name: N/A  . Number of Children: 7  . Years of Education: N/A   Occupational History  . Not on file.   Social History Main Topics  . Smoking status: Never Smoker   . Smokeless tobacco: Not on file   Comment: quit 1960's  smoked very lightly.  . Alcohol Use: No  . Drug Use: No  . Sexual Activity: Not on file   Other Topics Concern  . Not on file   Social History Narrative   Married.   Retired.   Works as a Engineer, structural.    Enjoys helping other.     Past Surgical History  Procedure Laterality Date  . Transthoracic echocardiogram  02/13/2010    EF =>55%, vigorous contraction EF 65%; LA mild-mod dilated; IV normal diameter - normal CVP; trace MR; mild TR; trace AV regurg  . Cardiac catheterization  05/07/2007    EF 55%, focal mild hypocontractility in mid-distal inferior wall & mid posterolateral wall; severe multivessel CAD - susequent CABGx6 (Dr. Bishop Limbo)  . Coronary artery bypass graft  05/09/2007    LIMA to LAD, veing to intermediate; SVG to OM1 & OM2; SVG to acute marginal & distal RCA (Dr. Dorris Fetch)    Family History  Problem Relation Age of Onset  . Cancer Mother   . Diabetes Sister   . Cancer Child     No Known Allergies  Current Outpatient Prescriptions on File Prior to Visit  Medication Sig Dispense Refill  . aspirin 81 MG tablet Take 81 mg by mouth daily.    Marland Kitchen glipiZIDE (GLUCOTROL XL)  10 MG 24 hr tablet Take 1 tablet (10 mg total) by mouth daily with breakfast. 90 tablet 2  . meclizine (ANTIVERT) 25 MG tablet Take 1 tablet (25 mg total) by mouth 3 (three) times daily as needed for dizziness. 30 tablet 0  . metFORMIN (GLUCOPHAGE) 1000 MG tablet Take 1 tablet (1,000 mg total) by mouth 2 (two) times daily with a meal. 60 tablet 11  . metoprolol succinate (TOPROL-XL) 50 MG 24 hr tablet Take 1 tablet (50 mg total) by mouth daily. Take with or immediately following a meal. 30 tablet 2  . Multiple Vitamin (MULTIVITAMIN) capsule Take 1 capsule by mouth daily.    . pantoprazole (PROTONIX) 40 MG tablet Take 1 tablet (40 mg total) by mouth daily. 30 tablet 2  . Polyethyl Glycol-Propyl Glycol (SYSTANE OP) Apply 2 drops to eye as needed.    . rosuvastatin (CRESTOR) 10 MG  tablet Take 1 tablet (10 mg total) by mouth daily. 90 tablet 2  . TRAVATAN Z 0.004 % SOLN ophthalmic solution Place 1 drop into both eyes at bedtime.    . valsartan-hydrochlorothiazide (DIOVAN-HCT) 80-12.5 MG tablet Take 1 tablet by mouth daily. 30 tablet 6   No current facility-administered medications on file prior to visit.    BP 124/78 mmHg  Pulse 76  Temp(Src) 97.7 F (36.5 C) (Oral)  Ht 5' 3.5" (1.613 m)  Wt 106 lb 12.8 oz (48.444 kg)  BMI 18.62 kg/m2  SpO2 97%    Objective:   Physical Exam  Constitutional: She appears well-nourished.  Cardiovascular: Normal rate and regular rhythm.   Pulmonary/Chest: Effort normal and breath sounds normal.  Skin: Skin is warm and dry.  Psychiatric: She has a normal mood and affect.          Assessment & Plan:

## 2015-06-03 NOTE — Assessment & Plan Note (Signed)
Recent blood sugar reading of 413 on labs per cardiology 4 days ago. Long discussion with patient today explaining that she MUST notify me of high or low sugars.  Will restart Levemir 10 units HS. Continue Glipizide and Metformin. She is to keep a log of her readings as I will call her in 1 week. She is to call sooner for readings below 80. Follow up in 1 month for re-evaluation, will continue to see her monthly until sugars are at goal.

## 2015-06-03 NOTE — Patient Instructions (Addendum)
Start using your Levemir insulin at night. Inject 10 units every night.  Record your blood sugars as I will call you in 1 week for these numbers.  Please call me if you see numbers below 80 or above 300 after starting the Levemir.  Follow up in 1 month as scheduled.  It was a pleasure to see you today!  Diabetes Mellitus and Food It is important for you to manage your blood sugar (glucose) level. Your blood glucose level can be greatly affected by what you eat. Eating healthier foods in the appropriate amounts throughout the day at about the same time each day will help you control your blood glucose level. It can also help slow or prevent worsening of your diabetes mellitus. Healthy eating may even help you improve the level of your blood pressure and reach or maintain a healthy weight.  General recommendations for healthful eating and cooking habits include:  Eating meals and snacks regularly. Avoid going long periods of time without eating to lose weight.  Eating a diet that consists mainly of plant-based foods, such as fruits, vegetables, nuts, legumes, and whole grains.  Using low-heat cooking methods, such as baking, instead of high-heat cooking methods, such as deep frying. Work with your dietitian to make sure you understand how to use the Nutrition Facts information on food labels. HOW CAN FOOD AFFECT ME? Carbohydrates Carbohydrates affect your blood glucose level more than any other type of food. Your dietitian will help you determine how many carbohydrates to eat at each meal and teach you how to count carbohydrates. Counting carbohydrates is important to keep your blood glucose at a healthy level, especially if you are using insulin or taking certain medicines for diabetes mellitus. Alcohol Alcohol can cause sudden decreases in blood glucose (hypoglycemia), especially if you use insulin or take certain medicines for diabetes mellitus. Hypoglycemia can be a life-threatening  condition. Symptoms of hypoglycemia (sleepiness, dizziness, and disorientation) are similar to symptoms of having too much alcohol.  If your health care provider has given you approval to drink alcohol, do so in moderation and use the following guidelines:  Women should not have more than one drink per day, and men should not have more than two drinks per day. One drink is equal to:  12 oz of beer.  5 oz of wine.  1 oz of hard liquor.  Do not drink on an empty stomach.  Keep yourself hydrated. Have water, diet soda, or unsweetened iced tea.  Regular soda, juice, and other mixers might contain a lot of carbohydrates and should be counted. WHAT FOODS ARE NOT RECOMMENDED? As you make food choices, it is important to remember that all foods are not the same. Some foods have fewer nutrients per serving than other foods, even though they might have the same number of calories or carbohydrates. It is difficult to get your body what it needs when you eat foods with fewer nutrients. Examples of foods that you should avoid that are high in calories and carbohydrates but low in nutrients include:  Trans fats (most processed foods list trans fats on the Nutrition Facts label).  Regular soda.  Juice.  Candy.  Sweets, such as cake, pie, doughnuts, and cookies.  Fried foods. WHAT FOODS CAN I EAT? Eat nutrient-rich foods, which will nourish your body and keep you healthy. The food you should eat also will depend on several factors, including:  The calories you need.  The medicines you take.  Your weight.  Your  blood glucose level.  Your blood pressure level.  Your cholesterol level. You should eat a variety of foods, including:  Protein.  Lean cuts of meat.  Proteins low in saturated fats, such as fish, egg whites, and beans. Avoid processed meats.  Fruits and vegetables.  Fruits and vegetables that may help control blood glucose levels, such as apples, mangoes, and  yams.  Dairy products.  Choose fat-free or low-fat dairy products, such as milk, yogurt, and cheese.  Grains, bread, pasta, and rice.  Choose whole grain products, such as multigrain bread, whole oats, and brown rice. These foods may help control blood pressure.  Fats.  Foods containing healthful fats, such as nuts, avocado, olive oil, canola oil, and fish. DOES EVERYONE WITH DIABETES MELLITUS HAVE THE SAME MEAL PLAN? Because every person with diabetes mellitus is different, there is not one meal plan that works for everyone. It is very important that you meet with a dietitian who will help you create a meal plan that is just right for you.   This information is not intended to replace advice given to you by your health care provider. Make sure you discuss any questions you have with your health care provider.   Document Released: 09/28/2004 Document Revised: 01/22/2014 Document Reviewed: 11/28/2012 Elsevier Interactive Patient Education [employer].

## 2015-06-06 ENCOUNTER — Ambulatory Visit: Payer: Medicare HMO | Admitting: Primary Care

## 2015-06-27 ENCOUNTER — Ambulatory Visit (INDEPENDENT_AMBULATORY_CARE_PROVIDER_SITE_OTHER): Payer: Medicare HMO | Admitting: Primary Care

## 2015-06-27 ENCOUNTER — Encounter: Payer: Self-pay | Admitting: Primary Care

## 2015-06-27 VITALS — BP 116/60 | HR 77 | Temp 97.6°F | Ht 64.0 in | Wt 107.8 lb

## 2015-06-27 DIAGNOSIS — R319 Hematuria, unspecified: Secondary | ICD-10-CM | POA: Diagnosis not present

## 2015-06-27 DIAGNOSIS — R42 Dizziness and giddiness: Secondary | ICD-10-CM

## 2015-06-27 DIAGNOSIS — E1165 Type 2 diabetes mellitus with hyperglycemia: Secondary | ICD-10-CM | POA: Diagnosis not present

## 2015-06-27 DIAGNOSIS — N390 Urinary tract infection, site not specified: Secondary | ICD-10-CM

## 2015-06-27 DIAGNOSIS — H8110 Benign paroxysmal vertigo, unspecified ear: Secondary | ICD-10-CM | POA: Diagnosis not present

## 2015-06-27 LAB — BASIC METABOLIC PANEL
BUN: 20 mg/dL (ref 6–23)
CALCIUM: 9.7 mg/dL (ref 8.4–10.5)
CHLORIDE: 103 meq/L (ref 96–112)
CO2: 27 meq/L (ref 19–32)
Creatinine, Ser: 1.52 mg/dL — ABNORMAL HIGH (ref 0.40–1.20)
GFR: 42.21 mL/min — ABNORMAL LOW (ref >60.00)
GLUCOSE: 172 mg/dL — AB (ref 70–99)
Potassium: 3.9 mEq/L (ref 3.5–5.1)
SODIUM: 139 meq/L (ref 135–145)

## 2015-06-27 LAB — POC URINALSYSI DIPSTICK (AUTOMATED)
GLUCOSE UA: NEGATIVE
Ketones, UA: NEGATIVE
NITRITE UA: NEGATIVE
Spec Grav, UA: 1.025
UROBILINOGEN UA: NEGATIVE
pH, UA: 6

## 2015-06-27 LAB — MICROALBUMIN / CREATININE URINE RATIO
Creatinine,U: 144.4 mg/dL
MICROALB/CREAT RATIO: 10.9 mg/g (ref 0.0–30.0)
Microalb, Ur: 15.8 mg/dL — ABNORMAL HIGH (ref 0.0–1.9)

## 2015-06-27 LAB — HEMOGLOBIN A1C: Hgb A1c MFr Bld: 16.2 % — ABNORMAL HIGH (ref 4.6–6.5)

## 2015-06-27 MED ORDER — CEPHALEXIN 500 MG PO CAPS: 500.0000 mg | ORAL_CAPSULE | Freq: Two times a day (BID) | ORAL | Status: DC

## 2015-06-27 NOTE — Assessment & Plan Note (Addendum)
Is not taking oral medications as prescribed. Is compliant to Levemir. Long discussion today regarding the importance of medication compliance and the risks of noncompliance such as stroke, heart attack, organ failure. Spent time today writing out her prescribed regimen and discussed when to take medications. She refuses diabetes education and diabetic nutritionist.  A1c and urine microalbumin pending today. Am reticent to change medications at this time as she has not been compliant. Would like to see what her sugars run when she is compliant. We will have her check sugars fasting twice daily and call her in one week for blood sugar readings. If above goal and we'll increase dosing of Levemir to twice a day.  If sugars remain uncontrolled, we'll need to consider social work consult for further evaluation.

## 2015-06-27 NOTE — Assessment & Plan Note (Signed)
Continues. Could be related to uncontrolled diabetes, but symptoms suggest vertigo. Moderate cerumen impaction to bilateral ears which were irrigated today. Discussed use of daily antihistamine. Given persistency of symptoms without improvement will send to ENT for further evaluation.

## 2015-06-27 NOTE — Patient Instructions (Addendum)
Complete lab work prior to leaving today. I will notify you of your results once received.   You will be contacted regarding your referral to Ear, Nose, Throat for your dizziness.  Please let us know if you have not heard back within one week.   You MUST take your diabetes medications as prescribed.  Glipizide XL 10 mg. Take 1 tablet in the morning EVERDAY.  Metformin 1000 mg. Take 1 tablet every morning AND every evening.  Levemir insulin. Inject 10 units every night at bedtime. Do this everynight!  Check your sugars twice daily before you eat. Write these numbers down. I will call you in 1 week with your sugar readings.  You have a urinary tract infection. Start taking Cephalexin antibiotics. Take 1 tablet by mouth twice daily for 7 days. You must take this medication exactly as prescribed!  Schedule an appointment in 3 months for re-evaluation.   It was a pleasure to see you today!  Diabetes Mellitus and Food It is important for you to manage your blood sugar (glucose) level. Your blood glucose level can be greatly affected by what you eat. Eating healthier foods in the appropriate amounts throughout the day at about the same time each day will help you control your blood glucose level. It can also help slow or prevent worsening of your diabetes mellitus. Healthy eating may even help you improve the level of your blood pressure and reach or maintain a healthy weight.  General recommendations for healthful eating and cooking habits include:  Eating meals and snacks regularly. Avoid going long periods of time without eating to lose weight.  Eating a diet that consists mainly of plant-based foods, such as fruits, vegetables, nuts, legumes, and whole grains.  Using low-heat cooking methods, such as baking, instead of high-heat cooking methods, such as deep frying. Work with your dietitian to make sure you understand how to use the Nutrition Facts information on food labels. HOW CAN FOOD  AFFECT ME? Carbohydrates Carbohydrates affect your blood glucose level more than any other type of food. Your dietitian will help you determine how many carbohydrates to eat at each meal and teach you how to count carbohydrates. Counting carbohydrates is important to keep your blood glucose at a healthy level, especially if you are using insulin or taking certain medicines for diabetes mellitus. Alcohol Alcohol can cause sudden decreases in blood glucose (hypoglycemia), especially if you use insulin or take certain medicines for diabetes mellitus. Hypoglycemia can be a life-threatening condition. Symptoms of hypoglycemia (sleepiness, dizziness, and disorientation) are similar to symptoms of having too much alcohol.  If your health care provider has given you approval to drink alcohol, do so in moderation and use the following guidelines:  Women should not have more than one drink per day, and men should not have more than two drinks per day. One drink is equal to:  12 oz of beer.  5 oz of wine.  1 oz of hard liquor.  Do not drink on an empty stomach.  Keep yourself hydrated. Have water, diet soda, or unsweetened iced tea.  Regular soda, juice, and other mixers might contain a lot of carbohydrates and should be counted. WHAT FOODS ARE NOT RECOMMENDED? As you make food choices, it is important to remember that all foods are not the same. Some foods have fewer nutrients per serving than other foods, even though they might have the same number of calories or carbohydrates. It is difficult to get your body what it needs  when you eat foods with fewer nutrients. Examples of foods that you should avoid that are high in calories and carbohydrates but low in nutrients include:  Trans fats (most processed foods list trans fats on the Nutrition Facts label).  Regular soda.  Juice.  Candy.  Sweets, such as cake, pie, doughnuts, and cookies.  Fried foods. WHAT FOODS CAN I EAT? Eat nutrient-rich  foods, which will nourish your body and keep you healthy. The food you should eat also will depend on several factors, including:  The calories you need.  The medicines you take.  Your weight.  Your blood glucose level.  Your blood pressure level.  Your cholesterol level. You should eat a variety of foods, including:  Protein.  Lean cuts of meat.  Proteins low in saturated fats, such as fish, egg whites, and beans. Avoid processed meats.  Fruits and vegetables.  Fruits and vegetables that may help control blood glucose levels, such as apples, mangoes, and yams.  Dairy products.  Choose fat-free or low-fat dairy products, such as milk, yogurt, and cheese.  Grains, bread, pasta, and rice.  Choose whole grain products, such as multigrain bread, whole oats, and brown rice. These foods may help control blood pressure.  Fats.  Foods containing healthful fats, such as nuts, avocado, olive oil, canola oil, and fish. DOES EVERYONE WITH DIABETES MELLITUS HAVE THE SAME MEAL PLAN? Because every person with diabetes mellitus is different, there is not one meal plan that works for everyone. It is very important that you meet with a dietitian who will help you create a meal plan that is just right for you.   This information is not intended to replace advice given to you by your health care provider. Make sure you discuss any questions you have with your health care provider.   Document Released: 09/28/2004 Document Revised: 01/22/2014 Document Reviewed: 11/28/2012 Elsevier Interactive Patient Education [employer]

## 2015-06-27 NOTE — Progress Notes (Signed)
Pre visit review using our clinic review tool, if applicable. No additional management support is needed unless otherwise documented below in the visit note. 

## 2015-06-27 NOTE — Progress Notes (Signed)
Subjective:    Patient ID: Laura Ferguson, female    DOB: 28-Jun-1932, 82 y.o.   MRN: [id number]  HPI  Laura Ferguson is an 82 year old female who presents today for follow up and a chief complaint of urinary frequency.  1) Type 2 Diabetes: Uncontrolled with hyperglycemia since establishing at our practice. Previous confusion in past in regards to her medication regimen, also non-compliant to her Levemir. Last visit Laura Ferguson was encouraged to continue her oral medications and restart her Levemir at 10 units HS.   Since her last visit Laura Ferguson has been compliant to her Levemir 10 units. Laura Ferguson has been using her Levemir every night and occasionally in the morning. Her highest glucose reading has been 800. This came down with a morning dose of Levemir 10 units. Her sugars are running 300-400 on average fasting.   Laura Ferguson has not taken her oral medication as prescribed and will take them 1-2 times weekly on average. Laura Ferguson is not following a diabetic diet. Laura Ferguson checked her sugar this morning fasting which was 116. Laura Ferguson did take her Levemir and metformin last night. Denies weakness, falls, headaches, visual changes.  2) Urinary Urgency: Also with decrease in appetite, fatigue, mild dysria for the past 1-2 weeks. Laura Ferguson denies hematuria, fevers, abdominal pain, nausea. Laura Ferguson's not taken anything over the counter for her symptoms.   3) Dizziness: Diagnosed with vertigo months ago and provided with a prescription for meclizine. Continues to experience symptoms of vertigo several times weekly.  Her balance is also off recently and her bilateral ears have been "roaring".   Review of Systems  Constitutional: Positive for fatigue. Negative for fever.  Respiratory: Negative for shortness of breath.   Cardiovascular: Negative for chest pain.  Genitourinary: Positive for dysuria and frequency. Negative for hematuria, flank pain and vaginal discharge.  Neurological: Positive for dizziness. Negative for weakness and headaches.       Past  Medical History  Diagnosis Date  . 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 CAD (coronary artery disease)   . Hypertension   . Hyperlipidemia   . Type 2 diabetes mellitus (HCC)   . History of nuclear stress test 11/22/2011    bruce myoview; normal pattern of perfusio; post-stress EF 76%; low risk scan     Social History   Social History  . Marital Status: Widowed    Spouse Name: N/A  . Number of Children: 7  . Years of Education: N/A   Occupational History  . Not on file.   Social History Main Topics  . Smoking status: Never Smoker   . Smokeless tobacco: Not on file     Comment: quit 1960's  smoked very lightly.  . Alcohol Use: No  . Drug Use: No  . Sexual Activity: Not on file   Other Topics Concern  . Not on file   Social History Narrative   Married.   Retired.   Works as a Engineer, structuralCaregiver.    Enjoys helping other.     Past Surgical History  Procedure Laterality Date  . Transthoracic echocardiogram  02/13/2010    EF =>55%, vigorous contraction EF 65%; LA mild-mod dilated; IV normal diameter - normal CVP; trace MR; mild TR; trace AV regurg  . Cardiac catheterization  05/07/2007    EF 55%, focal mild hypocontractility in mid-distal inferior wall & mid posterolateral wall; severe  multivessel CAD - susequent CABGx6 (Dr. Bishop Limbo)  . Coronary artery bypass graft  05/09/2007    LIMA to LAD, veing to intermediate; SVG to OM1 & OM2; SVG to acute marginal & distal RCA (Dr. Dorris Fetch)    Family History  Problem Relation Age of Onset  . Cancer Mother   . Diabetes Sister   . Cancer Child     No Known Allergies  Current Outpatient Prescriptions on File Prior to Visit  Medication Sig Dispense Refill  . aspirin 81 MG tablet Take 81 mg by mouth daily.    Marland Kitchen glipiZIDE (GLUCOTROL XL) 10 MG 24 hr tablet Take 1 tablet (10 mg total) by mouth daily with breakfast. 90 tablet 2  . Insulin Detemir  (LEVEMIR) 100 UNIT/ML Pen Inject 10 Units into the skin daily at 10 pm. 15 mL 11  . meclizine (ANTIVERT) 25 MG tablet Take 1 tablet (25 mg total) by mouth 3 (three) times daily as needed for dizziness. 30 tablet 0  . metFORMIN (GLUCOPHAGE) 1000 MG tablet Take 1 tablet (1,000 mg total) by mouth 2 (two) times daily with a meal. 60 tablet 11  . metoprolol succinate (TOPROL-XL) 50 MG 24 hr tablet Take 1 tablet (50 mg total) by mouth daily. Take with or immediately following a meal. 30 tablet 2  . Multiple Vitamin (MULTIVITAMIN) capsule Take 1 capsule by mouth daily.    . pantoprazole (PROTONIX) 40 MG tablet Take 1 tablet (40 mg total) by mouth daily. 30 tablet 2  . Polyethyl Glycol-Propyl Glycol (SYSTANE OP) Apply 2 drops to eye as needed.    . rosuvastatin (CRESTOR) 10 MG tablet Take 1 tablet (10 mg total) by mouth daily. 90 tablet 2  . TRAVATAN Z 0.004 % SOLN ophthalmic solution Place 1 drop into both eyes at bedtime.    . valsartan-hydrochlorothiazide (DIOVAN-HCT) 80-12.5 MG tablet Take 1 tablet by mouth daily. 30 tablet 6   No current facility-administered medications on file prior to visit.    BP 116/60 mmHg  Pulse 77  Temp(Src) 97.6 F (36.4 C) (Oral)  Ht  (1.626 m)  Wt 107 lb 12.8 oz (48.898 kg)  BMI 18.49 kg/m2  SpO2 99%    Objective:   Physical Exam  Constitutional: Laura Ferguson is oriented to person, place, and time. Laura Ferguson appears well-nourished.  Cardiovascular: Normal rate and regular rhythm.   Pulmonary/Chest: Effort normal and breath sounds normal.  Abdominal: Soft. Bowel sounds are normal. There is no tenderness. There is no CVA tenderness.  Neurological: Laura Ferguson is alert and oriented to person, place, and time.  Skin: Skin is warm and dry.  Psychiatric: Laura Ferguson has a normal mood and affect.          Assessment & Plan:  Urinary tract infection:  Urinary urgency, dysuria, pelvic discomfort for 1-2 weeks. UA: 3+ leuks, 1+ blood, negative nitrites Cultures sent. Exam  unremarkable. Prescription for Keflex 500 mg twice a day 7 days sent to pharmacy. Stressed the importance of compliance to this medication. Laura Ferguson verbalized understanding, all instructions were written out on her AVS..

## 2015-06-28 ENCOUNTER — Encounter: Payer: Self-pay | Admitting: *Deleted

## 2015-06-29 ENCOUNTER — Telehealth: Payer: Self-pay | Admitting: Primary Care

## 2015-06-29 LAB — URINE CULTURE: Colony Count: >=100000

## 2015-06-29 NOTE — Telephone Encounter (Signed)
Noted. Will check on patient in one week for blood sugar readings.

## 2015-06-29 NOTE — Telephone Encounter (Signed)
Spoken to patient yesterday on 06/28/2015. Tried to explain the importance of taking all her diabetes medications.Tried to explain to patient that her A1C is higher. Patient insisted that she is checking her sugar every day. Patient also stated that she has a son who is a Engineer, civil (consulting)nurse and asked if we can mail the results. I already place results and Kate's comments on a letter and mailed it out for the patient.

## 2015-07-04 ENCOUNTER — Telehealth: Payer: Self-pay | Admitting: Primary Care

## 2015-07-04 NOTE — Telephone Encounter (Signed)
Tried to call patient but did not get a hold of patient and could not leave due to mailbox is full.

## 2015-07-04 NOTE — Telephone Encounter (Signed)
-----   Message from Doreene NestKatherine K Faythe Heitzenrater, NP sent at 06/27/2015  4:59 PM EDT ----- Regarding: Blood Sugars Please check on Laura Ferguson's blood sugars.  #1 is she checking her sugars before meals? #2 what are her sugars running?  #3 is she taking her glipizide and metformin in the morning, and metformin and Levemir at night?

## 2015-07-06 NOTE — Telephone Encounter (Signed)
Message left for patient to return my call.  

## 2015-07-18 ENCOUNTER — Other Ambulatory Visit: Payer: Self-pay | Admitting: Cardiovascular Disease

## 2015-07-18 MED ORDER — METOPROLOL SUCCINATE ER 50 MG PO TB24: 50.0000 mg | ORAL_TABLET | Freq: Every day | ORAL | Status: DC

## 2015-07-18 NOTE — Telephone Encounter (Signed)
Rx request sent to pharmacy.  

## 2015-07-27 ENCOUNTER — Ambulatory Visit (INDEPENDENT_AMBULATORY_CARE_PROVIDER_SITE_OTHER): Payer: Medicare HMO | Admitting: Primary Care

## 2015-07-27 VITALS — BP 120/62 | HR 70 | Temp 98.1°F | Ht 64.0 in | Wt 109.8 lb

## 2015-07-27 DIAGNOSIS — Z794 Long term (current) use of insulin: Secondary | ICD-10-CM | POA: Diagnosis not present

## 2015-07-27 DIAGNOSIS — E1165 Type 2 diabetes mellitus with hyperglycemia: Secondary | ICD-10-CM

## 2015-07-27 MED ORDER — GLUCOSE BLOOD VI STRP: ORAL_STRIP | Status: DC

## 2015-07-27 MED ORDER — ACCU-CHEK SAFE-T PRO LANCETS MISC
Status: DC
Start: 2015-07-27 — End: 2017-05-21

## 2015-07-27 NOTE — Progress Notes (Signed)
Pre visit review using our clinic review tool, if applicable. No additional management support is needed unless otherwise documented below in the visit note. 

## 2015-07-27 NOTE — Progress Notes (Signed)
Subjective:    Patient ID: Laura Ferguson, female    DOB: [birth year], 82 y.o.   MRN: [id number]  HPI  Laura Ferguson is an 82 year old female who presents today for follow up of type 2 diabetes.  She has a history of non compliance to diabetes treatment along with confusion on regimen. Over the past several visits we've discussed importance of compliance and gone through every detail of her prescribed regimen. She is to be taking Glipizide XL 10 mg once daily, Metformin 1000 mg BID, and Levemir 20 units HS. A1C last month with increase to 16.2 from 14.3 in March.  She's checking her sugars fasting in the morning which are running 90's-low 100's. She will then check her blood sugar again fasting around noon with readings of 90's-200's. She then will inject 20 units of Levemir. She will check her sugars again fasting at 6pm which run about 100-200's on average. She will sometimes take her Metformin in the evening.  She's feeling improved overall as she's noticed less dizziness and increase in energy. Denies feeling shaky, low numbers below 60, changes in vision, falls, weakness.   Review of Systems  Eyes: Negative for visual disturbance.  Respiratory: Negative for shortness of breath.   Cardiovascular: Negative for chest pain.  Neurological: Negative for dizziness and weakness.       Past Medical History  Diagnosis Date  . 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 CAD (coronary artery disease)   . Hypertension   . Hyperlipidemia   . Type 2 diabetes mellitus (HCC)   . History of nuclear stress test 11/22/2011    bruce myoview; normal pattern of perfusio; post-stress EF 76%; low risk scan     Social History   Social History  . Marital Status: Widowed    Spouse Name: N/A  . Number of Children: 7  . Years of Education: N/A   Occupational History  . Not on file.   Social History Main Topics  . Smoking  status: Never Smoker   . Smokeless tobacco: Not on file     Comment: quit 1960's  smoked very lightly.  . Alcohol Use: No  . Drug Use: No  . Sexual Activity: Not on file   Other Topics Concern  . Not on file   Social History Narrative   Married.   Retired.   Works as a Engineer, structural.    Enjoys helping other.     Past Surgical History  Procedure Laterality Date  . Transthoracic echocardiogram  02/13/2010    EF =>55%, vigorous contraction EF 65%; LA mild-mod dilated; IV normal diameter - normal CVP; trace MR; mild TR; trace AV regurg  . Cardiac catheterization  05/07/2007    EF 55%, focal mild hypocontractility in mid-distal inferior wall & mid posterolateral wall; severe multivessel CAD - susequent CABGx6 (Dr. Bishop Limbo)  . Coronary artery bypass graft  05/09/2007    LIMA to LAD, veing to intermediate; SVG to OM1 & OM2; SVG to acute marginal & distal RCA (Dr. Dorris Fetch)    Family History  Problem Relation Age of Onset  . Cancer Mother   . Diabetes Sister   . Cancer Child     No Known Allergies  Current Outpatient Prescriptions on File Prior to Visit  Medication Sig Dispense Refill  . aspirin 81 MG tablet Take 81 mg by  mouth daily.    Marland Kitchen. glipiZIDE (GLUCOTROL XL) 10 MG 24 hr tablet Take 1 tablet (10 mg total) by mouth daily with breakfast. 90 tablet 2  . Insulin Detemir (LEVEMIR) 100 UNIT/ML Pen Inject 10 Units into the skin daily at 10 pm. 15 mL 11  . meclizine (ANTIVERT) 25 MG tablet Take 1 tablet (25 mg total) by mouth 3 (three) times daily as needed for dizziness. 30 tablet 0  . metFORMIN (GLUCOPHAGE) 1000 MG tablet Take 1 tablet (1,000 mg total) by mouth 2 (two) times daily with a meal. 60 tablet 11  . metoprolol succinate (TOPROL-XL) 50 MG 24 hr tablet Take 1 tablet (50 mg total) by mouth daily. Take with or immediately following a meal. 30 tablet 6  . Multiple Vitamin (MULTIVITAMIN) capsule Take 1 capsule by mouth daily.    . pantoprazole (PROTONIX) 40 MG tablet Take 1  tablet (40 mg total) by mouth daily. 30 tablet 2  . Polyethyl Glycol-Propyl Glycol (SYSTANE OP) Apply 2 drops to eye as needed.    . rosuvastatin (CRESTOR) 10 MG tablet Take 1 tablet (10 mg total) by mouth daily. 90 tablet 2  . TRAVATAN Z 0.004 % SOLN ophthalmic solution Place 1 drop into both eyes at bedtime.    . valsartan-hydrochlorothiazide (DIOVAN-HCT) 80-12.5 MG tablet Take 1 tablet by mouth daily. 30 tablet 6   No current facility-administered medications on file prior to visit.    BP 120/62 mmHg  Pulse 70  Temp(Src) 98.1 F (36.7 C) (Oral)  Ht 5\' 4"  (1.626 m)  Wt 109 lb 12.8 oz (49.805 kg)  BMI 18.84 kg/m2  SpO2 98%    Objective:   Physical Exam  Constitutional: She appears well-nourished.  Cardiovascular: Normal rate and regular rhythm.   Pulmonary/Chest: Effort normal and breath sounds normal.  Skin: Skin is warm and dry.  Psychiatric: She has a normal mood and affect.          Assessment & Plan:

## 2015-07-27 NOTE — Patient Instructions (Signed)
Continue to take Glipizide XL 10 mg and Metformin 1000 mg every morning, Levemir 20 units every day at noon, and Metformin 1000 mg every evening.  Continue to monitor your blood sugars three times daily before eating. Record your readings and bring them to your next visit.  Please call me if you notice blood sugars consistently above 200, or below 70.  Work to make improvements in your diet by limiting fried foods, junk food, fast food, sweet food, sweet drinks.  Follow up in 2 months for re-evaluation.   It was a pleasure to see you today!  Diabetes Mellitus and Food It is important for you to manage your blood sugar (glucose) level. Your blood glucose level can be greatly affected by what you eat. Eating healthier foods in the appropriate amounts throughout the day at about the same time each day will help you control your blood glucose level. It can also help slow or prevent worsening of your diabetes mellitus. Healthy eating may even help you improve the level of your blood pressure and reach or maintain a healthy weight.  General recommendations for healthful eating and cooking habits include:  Eating meals and snacks regularly. Avoid going long periods of time without eating to lose weight.  Eating a diet that consists mainly of plant-based foods, such as fruits, vegetables, nuts, legumes, and whole grains.  Using low-heat cooking methods, such as baking, instead of high-heat cooking methods, such as deep frying. Work with your dietitian to make sure you understand how to use the Nutrition Facts information on food labels. HOW CAN FOOD AFFECT ME? Carbohydrates Carbohydrates affect your blood glucose level more than any other type of food. Your dietitian will help you determine how many carbohydrates to eat at each meal and teach you how to count carbohydrates. Counting carbohydrates is important to keep your blood glucose at a healthy level, especially if you are using insulin or taking  certain medicines for diabetes mellitus. Alcohol Alcohol can cause sudden decreases in blood glucose (hypoglycemia), especially if you use insulin or take certain medicines for diabetes mellitus. Hypoglycemia can be a life-threatening condition. Symptoms of hypoglycemia (sleepiness, dizziness, and disorientation) are similar to symptoms of having too much alcohol.  If your health care provider has given you approval to drink alcohol, do so in moderation and use the following guidelines:  Women should not have more than one drink per day, and men should not have more than two drinks per day. One drink is equal to:  12 oz of beer.  5 oz of wine.  1 oz of hard liquor.  Do not drink on an empty stomach.  Keep yourself hydrated. Have water, diet soda, or unsweetened iced tea.  Regular soda, juice, and other mixers might contain a lot of carbohydrates and should be counted. WHAT FOODS ARE NOT RECOMMENDED? As you make food choices, it is important to remember that all foods are not the same. Some foods have fewer nutrients per serving than other foods, even though they might have the same number of calories or carbohydrates. It is difficult to get your body what it needs when you eat foods with fewer nutrients. Examples of foods that you should avoid that are high in calories and carbohydrates but low in nutrients include:  Trans fats (most processed foods list trans fats on the Nutrition Facts label).  Regular soda.  Juice.  Candy.  Sweets, such as cake, pie, doughnuts, and cookies.  Fried foods. WHAT FOODS CAN I EAT?  Eat nutrient-rich foods, which will nourish your body and keep you healthy. The food you should eat also will depend on several factors, including:  The calories you need.  The medicines you take.  Your weight.  Your blood glucose level.  Your blood pressure level.  Your cholesterol level. You should eat a variety of foods, including:  Protein.  Lean cuts of  meat.  Proteins low in saturated fats, such as fish, egg whites, and beans. Avoid processed meats.  Fruits and vegetables.  Fruits and vegetables that may help control blood glucose levels, such as apples, mangoes, and yams.  Dairy products.  Choose fat-free or low-fat dairy products, such as milk, yogurt, and cheese.  Grains, bread, pasta, and rice.  Choose whole grain products, such as multigrain bread, whole oats, and brown rice. These foods may help control blood pressure.  Fats.  Foods containing healthful fats, such as nuts, avocado, olive oil, canola oil, and fish. DOES EVERYONE WITH DIABETES MELLITUS HAVE THE SAME MEAL PLAN? Because every person with diabetes mellitus is different, there is not one meal plan that works for everyone. It is very important that you meet with a dietitian who will help you create a meal plan that is just right for you.   This information is not intended to replace advice given to you by your health care provider. Make sure you discuss any questions you have with your health care provider.   Document Released: 09/28/2004 Document Revised: 01/22/2014 Document Reviewed: 11/28/2012 Elsevier Interactive Patient Education [employer] Inc[employer]

## 2015-07-27 NOTE — Assessment & Plan Note (Signed)
Endorses sugars running 90-mid 100's on average. I still question this and her compliance, but she did not bring in her meter or her blood sugar log as requested. Again, discussed importance of compliance to her regimen and to report glucose levels consistently above 200 or below 70. Also discussed importance of diabetic diet as she refuses nutrition consult and education classes. Will have her follow up in 2 months with sugar logs and repeat A1C.

## 2015-08-22 ENCOUNTER — Other Ambulatory Visit: Payer: Self-pay

## 2015-08-22 MED ORDER — INSULIN PEN NEEDLE 31G X 6 MM MISC: 3 refills | Status: DC

## 2015-08-22 NOTE — Telephone Encounter (Signed)
[facility] called for ulticare pen needle 31 G x 6 mm. Use with Levemir. Done.

## 2015-09-27 ENCOUNTER — Ambulatory Visit: Payer: Medicare HMO | Admitting: Primary Care

## 2015-09-30 ENCOUNTER — Ambulatory Visit (INDEPENDENT_AMBULATORY_CARE_PROVIDER_SITE_OTHER): Payer: Medicare HMO | Admitting: Primary Care

## 2015-09-30 ENCOUNTER — Encounter: Payer: Self-pay | Admitting: Primary Care

## 2015-09-30 VITALS — BP 110/70 | HR 73 | Temp 98.0°F | Ht 64.0 in | Wt 105.8 lb

## 2015-09-30 DIAGNOSIS — E1165 Type 2 diabetes mellitus with hyperglycemia: Secondary | ICD-10-CM | POA: Diagnosis not present

## 2015-09-30 DIAGNOSIS — I10 Essential (primary) hypertension: Secondary | ICD-10-CM | POA: Diagnosis not present

## 2015-09-30 LAB — BASIC METABOLIC PANEL
BUN: 35 mg/dL — ABNORMAL HIGH (ref 6–23)
CHLORIDE: 100 meq/L (ref 96–112)
CO2: 25 mEq/L (ref 19–32)
Calcium: 9.5 mg/dL (ref 8.4–10.5)
Creatinine, Ser: 1.64 mg/dL — ABNORMAL HIGH (ref 0.40–1.20)
GFR: 38.64 mL/min — AB (ref >60.00)
Glucose, Bld: 308 mg/dL — ABNORMAL HIGH (ref 70–99)
POTASSIUM: 4.1 meq/L (ref 3.5–5.1)
SODIUM: 135 meq/L (ref 135–145)

## 2015-09-30 LAB — HEMOGLOBIN A1C: HEMOGLOBIN A1C: 9.5 % — AB (ref 4.6–6.5)

## 2015-09-30 NOTE — Patient Instructions (Signed)
Complete lab work prior to leaving today. I will notify you of your results once received.   Continue to monitor your blood sugars. Check your blood sugars in the morning before breakfast, 2 hours after lunch, and just before bedtime.  Continue Metformin 1000 mg tablets. Take 1 tablet every morning with breakfast and every evening with supper.   Continue Glipizide XL 10 mg. Take 1 tablet by mouth every morning with breakfast.  Continue Levemir 20 units every evening for now. We may need to increase this medication. I will notify you of any increase once I receive the results of your diabetes test.  Follow up in 3 months for re-evaluation.  It was a pleasure to see you today!  Diabetes Mellitus and Food It is important for you to manage your blood sugar (glucose) level. Your blood glucose level can be greatly affected by what you eat. Eating healthier foods in the appropriate amounts throughout the day at about the same time each day will help you control your blood glucose level. It can also help slow or prevent worsening of your diabetes mellitus. Healthy eating may even help you improve the level of your blood pressure and reach or maintain a healthy weight.  General recommendations for healthful eating and cooking habits include:  Eating meals and snacks regularly. Avoid going long periods of time without eating to lose weight.  Eating a diet that consists mainly of plant-based foods, such as fruits, vegetables, nuts, legumes, and whole grains.  Using low-heat cooking methods, such as baking, instead of high-heat cooking methods, such as deep frying. Work with your dietitian to make sure you understand how to use the Nutrition Facts information on food labels. HOW CAN FOOD AFFECT ME? Carbohydrates Carbohydrates affect your blood glucose level more than any other type of food. Your dietitian will help you determine how many carbohydrates to eat at each meal and teach you how to count  carbohydrates. Counting carbohydrates is important to keep your blood glucose at a healthy level, especially if you are using insulin or taking certain medicines for diabetes mellitus. Alcohol Alcohol can cause sudden decreases in blood glucose (hypoglycemia), especially if you use insulin or take certain medicines for diabetes mellitus. Hypoglycemia can be a life-threatening condition. Symptoms of hypoglycemia (sleepiness, dizziness, and disorientation) are similar to symptoms of having too much alcohol.  If your health care provider has given you approval to drink alcohol, do so in moderation and use the following guidelines:  Women should not have more than one drink per day, and men should not have more than two drinks per day. One drink is equal to:  12 oz of beer.  5 oz of wine.  1 oz of hard liquor.  Do not drink on an empty stomach.  Keep yourself hydrated. Have water, diet soda, or unsweetened iced tea.  Regular soda, juice, and other mixers might contain a lot of carbohydrates and should be counted. WHAT FOODS ARE NOT RECOMMENDED? As you make food choices, it is important to remember that all foods are not the same. Some foods have fewer nutrients per serving than other foods, even though they might have the same number of calories or carbohydrates. It is difficult to get your body what it needs when you eat foods with fewer nutrients. Examples of foods that you should avoid that are high in calories and carbohydrates but low in nutrients include:  Trans fats (most processed foods list trans fats on the Nutrition Facts label).  Regular soda.  Juice.  Candy.  Sweets, such as cake, pie, doughnuts, and cookies.  Fried foods. WHAT FOODS CAN I EAT? Eat nutrient-rich foods, which will nourish your body and keep you healthy. The food you should eat also will depend on several factors, including:  The calories you need.  The medicines you take.  Your weight.  Your blood  glucose level.  Your blood pressure level.  Your cholesterol level. You should eat a variety of foods, including:  Protein.  Lean cuts of meat.  Proteins low in saturated fats, such as fish, egg whites, and beans. Avoid processed meats.  Fruits and vegetables.  Fruits and vegetables that may help control blood glucose levels, such as apples, mangoes, and yams.  Dairy products.  Choose fat-free or low-fat dairy products, such as milk, yogurt, and cheese.  Grains, bread, pasta, and rice.  Choose whole grain products, such as multigrain bread, whole oats, and brown rice. These foods may help control blood pressure.  Fats.  Foods containing healthful fats, such as nuts, avocado, olive oil, canola oil, and fish. DOES EVERYONE WITH DIABETES MELLITUS HAVE THE SAME MEAL PLAN? Because every person with diabetes mellitus is different, there is not one meal plan that works for everyone. It is very important that you meet with a dietitian who will help you create a meal plan that is just right for you.   This information is not intended to replace advice given to you by your health care provider. Make sure you discuss any questions you have with your health care provider.   Document Released: 09/28/2004 Document Revised: 01/22/2014 Document Reviewed: 11/28/2012 Elsevier Interactive Patient Education [employer]

## 2015-09-30 NOTE — Assessment & Plan Note (Signed)
Endorses compliance to medication. Sugars running 150-200 on average. Conitnue medications for now. May need to increase Levemir to twice daily dosing if no improvement. A1C pending. Foot exam unremarkable.

## 2015-09-30 NOTE — Progress Notes (Signed)
Subjective:    Patient ID: Laura Ferguson, female    DOB: Nov 04, 1932, 82 y.o.   MRN: [id number]  HPI  Laura Ferguson is an 82 year old female who presents today for follow up.  1) Type 2 Diabetes: Currently managed on Levemir 20 units once every evening, Glipizide XL 10 mg, and Metformin 1000 mg twice daily. Her A1C in June was 16.2. She has a history of non compliance to diabetes medications. Urine microalbumin from June 2017 positive, she is managed on Diovan. She was strongly encouraged to eat a diabetic diet, record her sugars and bring them to this appointment. She has refused referral to a diabetic nutritionist in the past.  Since her last visit she has not recorded her blood sugars. She is checking her sugars fasting in the morning (runnin 150-200's),  20 minutes after lunch (running 150-200's), and at bedtime (running 170-200's). Her lowest blopd sugar reading as been 96 and highest has been 300. She endorses a healthy diet for the most part. She denies dizziness, feeling jittery, numbness/tingling. She has noticed an increase in energy levels overall.   Wt Readings from Last 3 Encounters:  09/30/15 105 lb 12.8 oz (48 kg)  07/27/15 109 lb 12.8 oz (49.8 kg)  06/27/15 107 lb 12.8 oz (48.9 kg)     2) Essential Hypertension: Currently managed on Diovan. BP in the clinic today is stable. Denies chest pain.   BP Readings from Last 3 Encounters:  09/30/15 110/70  07/27/15 120/62  06/27/15 116/60      Review of Systems  Constitutional: Negative for unexpected weight change.  Respiratory: Negative for shortness of breath.   Cardiovascular: Negative for chest pain.  Gastrointestinal: Negative for abdominal pain and nausea.  Neurological: Negative for dizziness, numbness and headaches.       Past Medical History:  Diagnosis Date  . CAD (coronary artery disease)   . History of nuclear stress test 11/22/2011   bruce myoview; normal pattern of perfusio; post-stress EF 76%; low risk scan    . Hyperlipidemia   . Hypertension   . 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 Type 2 diabetes mellitus (HCC)      Social History   Social History  . Marital status: Widowed    Spouse name: N/A  . Number of children: 7  . Years of education: N/A   Occupational History  . Not on file.   Social History Main Topics  . Smoking status: Never Smoker  . Smokeless tobacco: Not on file     Comment: quit 1960's  smoked very lightly.  . Alcohol use No  . Drug use: No  . Sexual activity: Not on file   Other Topics Concern  . Not on file   Social History Narrative   Married.   Retired.   Works as a Engineer, structural.    Enjoys helping other.     Past Surgical History:  Procedure Laterality Date  . CARDIAC CATHETERIZATION  05/07/2007   EF 55%, focal mild hypocontractility in mid-distal inferior wall & mid posterolateral wall; severe multivessel CAD - susequent CABGx6 (Dr. Bishop Limbo)  . CORONARY ARTERY BYPASS GRAFT  05/09/2007   LIMA to LAD, veing to intermediate; SVG to OM1 & OM2; SVG to acute marginal & distal RCA (Dr. Dorris Fetch)  . TRANSTHORACIC ECHOCARDIOGRAM  02/13/2010   EF =>55%, vigorous contraction EF  65%; LA mild-mod dilated; IV normal diameter - normal CVP; trace MR; mild TR; trace AV regurg    Family History  Problem Relation Age of Onset  . Cancer Mother   . Diabetes Sister   . Cancer Child     No Known Allergies  Current Outpatient Prescriptions on File Prior to Visit  Medication Sig Dispense Refill  . aspirin 81 MG tablet Take 81 mg by mouth daily.    Marland Kitchen. glipiZIDE (GLUCOTROL XL) 10 MG 24 hr tablet Take 1 tablet (10 mg total) by mouth daily with breakfast. 90 tablet 2  . glucose blood (ACCU-CHEK ACTIVE STRIPS) test strip Use as instructed to test blood sugar 3 times daily. 100 each 11  . Insulin Detemir (LEVEMIR) 100 UNIT/ML Pen Inject 10 Units into the skin daily at 10 pm. (Patient  taking differently: Inject 20 Units into the skin daily at 10 pm. ) 15 mL 11  . Insulin Pen Needle (ULTICARE MINI PEN NEEDLES) 31G X 6 MM MISC Use pen needles to inject levemir Dx E11.65 100 each 3  . Lancets (ACCU-CHEK SAFE-T PRO) lancets Use as instructed to test blood sugar 3 times daily. 100 each 11  . metFORMIN (GLUCOPHAGE) 1000 MG tablet Take 1 tablet (1,000 mg total) by mouth 2 (two) times daily with a meal. 60 tablet 11  . metoprolol succinate (TOPROL-XL) 50 MG 24 hr tablet Take 1 tablet (50 mg total) by mouth daily. Take with or immediately following a meal. 30 tablet 6  . Multiple Vitamin (MULTIVITAMIN) capsule Take 1 capsule by mouth daily.    . pantoprazole (PROTONIX) 40 MG tablet Take 1 tablet (40 mg total) by mouth daily. 30 tablet 2  . Polyethyl Glycol-Propyl Glycol (SYSTANE OP) Apply 2 drops to eye as needed.    . rosuvastatin (CRESTOR) 10 MG tablet Take 1 tablet (10 mg total) by mouth daily. 90 tablet 2  . TRAVATAN Z 0.004 % SOLN ophthalmic solution Place 1 drop into both eyes at bedtime.    . valsartan-hydrochlorothiazide (DIOVAN-HCT) 80-12.5 MG tablet Take 1 tablet by mouth daily. 30 tablet 6   No current facility-administered medications on file prior to visit.     BP 110/70   Pulse 73   Temp 98 F (36.7 C) (Oral)   Ht 5\' 4"  (1.626 m)   Wt 105 lb 12.8 oz (48 kg)   SpO2 94%   BMI 18.16 kg/m    Objective:   Physical Exam  Constitutional: She is oriented to person, place, and time. She appears well-nourished.  Neck: Neck supple.  Cardiovascular: Normal rate and regular rhythm.   Pulmonary/Chest: Effort normal and breath sounds normal.  Neurological: She is alert and oriented to person, place, and time.  Skin: Skin is warm and dry.          Assessment & Plan:

## 2015-09-30 NOTE — Progress Notes (Signed)
Pre visit review using our clinic review tool, if applicable. No additional management support is needed unless otherwise documented below in the visit note. 

## 2015-09-30 NOTE — Assessment & Plan Note (Signed)
Stable in the office today. Continue on Diovan.

## 2015-10-03 ENCOUNTER — Encounter: Payer: Self-pay | Admitting: *Deleted

## 2015-10-05 ENCOUNTER — Telehealth: Payer: Self-pay

## 2015-10-05 DIAGNOSIS — IMO0001 Reserved for inherently not codable concepts without codable children: Secondary | ICD-10-CM

## 2015-10-05 DIAGNOSIS — E1165 Type 2 diabetes mellitus with hyperglycemia: Principal | ICD-10-CM

## 2015-10-05 MED ORDER — INSULIN DETEMIR 100 UNIT/ML FLEXPEN: PEN_INJECTOR | SUBCUTANEOUS | Status: DC

## 2015-10-05 MED ORDER — INSULIN DETEMIR 100 UNIT/ML FLEXPEN: 30.0000 [IU] | PEN_INJECTOR | Freq: Two times a day (BID) | SUBCUTANEOUS | Status: DC

## 2015-10-05 NOTE — Telephone Encounter (Signed)
Please have patient start taking her Levemir in divided doses. Inject 15 units in the morning and 15 units in the evening. This is a total of 30 units. Please have her monitor her blood sugars and call us if they become less than 70 or higher than 300.   Please adjust this medication dose in her med list.

## 2015-10-05 NOTE — Telephone Encounter (Signed)
Pt seen 09/30/15 and pt said metformin was stopped. Pt taking glipizide one daily and levemir 25 units as instructed. Pt not sure what FBS was yesterday but during the day BS was 425. Last night BS was 254. This morning FBS was 200 and something;pt not sure. At 12:30 PM today BS was 401. Pt ate a salad with boiled chicken. Pt wants to know if needs to do something different with meds. Pt feels fine.Pt request cb. [facility].

## 2015-10-05 NOTE — Telephone Encounter (Signed)
Spoken and notified patient of Kate's comments. Patient verbalized understanding. 

## 2015-10-21 ENCOUNTER — Other Ambulatory Visit (INDEPENDENT_AMBULATORY_CARE_PROVIDER_SITE_OTHER): Payer: Medicare HMO

## 2015-10-21 DIAGNOSIS — N289 Disorder of kidney and ureter, unspecified: Secondary | ICD-10-CM

## 2015-10-21 LAB — BASIC METABOLIC PANEL
BUN: 24 mg/dL — AB (ref 6–23)
CALCIUM: 9.9 mg/dL (ref 8.4–10.5)
CO2: 28 mEq/L (ref 19–32)
CREATININE: 1.4 mg/dL — AB (ref 0.40–1.20)
Chloride: 101 mEq/L (ref 96–112)
GFR: 46.37 mL/min — AB (ref >60.00)
GLUCOSE: 331 mg/dL — AB (ref 70–99)
Potassium: 5 mEq/L (ref 3.5–5.1)
Sodium: 135 mEq/L (ref 135–145)

## 2015-10-24 ENCOUNTER — Telehealth: Payer: Self-pay | Admitting: Primary Care

## 2015-10-24 NOTE — Telephone Encounter (Signed)
Spoken and notified patient of Kate's comments. Patient verbalized understanding. 

## 2015-10-24 NOTE — Telephone Encounter (Signed)
Patient returned Chan's call. °

## 2015-11-23 ENCOUNTER — Other Ambulatory Visit: Payer: Self-pay | Admitting: Primary Care

## 2015-11-23 DIAGNOSIS — E119 Type 2 diabetes mellitus without complications: Secondary | ICD-10-CM

## 2015-11-28 ENCOUNTER — Other Ambulatory Visit: Payer: Self-pay | Admitting: Cardiovascular Disease

## 2015-11-28 NOTE — Telephone Encounter (Signed)
REFILL 

## 2015-12-12 ENCOUNTER — Encounter: Payer: Self-pay | Admitting: *Deleted

## 2015-12-13 ENCOUNTER — Ambulatory Visit: Payer: Medicare HMO | Admitting: Cardiovascular Disease

## 2016-01-03 ENCOUNTER — Ambulatory Visit (INDEPENDENT_AMBULATORY_CARE_PROVIDER_SITE_OTHER): Payer: Medicare HMO | Admitting: Primary Care

## 2016-01-03 ENCOUNTER — Encounter: Payer: Self-pay | Admitting: Primary Care

## 2016-01-03 VITALS — BP 140/76 | HR 64 | Temp 97.7°F | Ht 64.0 in | Wt 114.8 lb

## 2016-01-03 DIAGNOSIS — I2510 Atherosclerotic heart disease of native coronary artery without angina pectoris: Secondary | ICD-10-CM | POA: Diagnosis not present

## 2016-01-03 DIAGNOSIS — I10 Essential (primary) hypertension: Secondary | ICD-10-CM | POA: Diagnosis not present

## 2016-01-03 DIAGNOSIS — Z794 Long term (current) use of insulin: Secondary | ICD-10-CM | POA: Diagnosis not present

## 2016-01-03 DIAGNOSIS — E118 Type 2 diabetes mellitus with unspecified complications: Secondary | ICD-10-CM | POA: Diagnosis not present

## 2016-01-03 LAB — BASIC METABOLIC PANEL
BUN: 22 mg/dL (ref 6–23)
CHLORIDE: 100 meq/L (ref 96–112)
CO2: 29 mEq/L (ref 19–32)
CREATININE: 1.2 mg/dL (ref 0.40–1.20)
Calcium: 9.7 mg/dL (ref 8.4–10.5)
GFR: 55.37 mL/min — AB (ref >60.00)
Glucose, Bld: 324 mg/dL — ABNORMAL HIGH (ref 70–99)
POTASSIUM: 4.4 meq/L (ref 3.5–5.1)
Sodium: 135 mEq/L (ref 135–145)

## 2016-01-03 LAB — HEMOGLOBIN A1C: HEMOGLOBIN A1C: 10 % — AB (ref 4.6–6.5)

## 2016-01-03 MED ORDER — VALSARTAN-HYDROCHLOROTHIAZIDE 80-12.5 MG PO TABS: 1.0000 | ORAL_TABLET | Freq: Every day | ORAL | 3 refills | Status: DC

## 2016-01-03 NOTE — Assessment & Plan Note (Signed)
Due for A1C today. Discussed importance of diabetic diet. Will adjust medications accordingly if necessary. Discussed to inject Levemir HS rather than AM.

## 2016-01-03 NOTE — Assessment & Plan Note (Signed)
Overall stable, continue current regimen. Refill provided for Diovan today. BMP pending.

## 2016-01-03 NOTE — Progress Notes (Signed)
Subjective:    Patient ID: Laura Ferguson, female    DOB: 03-30-1932, 82 y.o.   MRN: [id number]  HPI  Laura Ferguson is an 82 year old female who presents today for follow up.  1) Type 2 Diabetes: Currently managed on Glipizide XL 10 mg once daily and Levemir 30 units HS. Her A1C in September 2017 was 9.5. Her Metformin was stopped last visit due to reduction in renal function. BMP check following removal of her Metformin showed improvement.  Since her last visit she's checking her blood sugars fasting in the morning which are running between 90-300's; in the afternoon which are running 150-190's; and in the evening which are running 150-190. She's injecting her Levemir in the morning rather than HS. She feels well and much better since we stopped Metformin. She's trying to watch her diet. She denies numbness/tingling, polyuria, dizziness, weakness.   Review of Systems  Constitutional: Negative for fatigue.  Eyes: Negative for visual disturbance.  Respiratory: Negative for shortness of breath.   Cardiovascular: Negative for chest pain.  Endocrine: Negative for polyuria.  Neurological: Negative for dizziness and weakness.       Past Medical History:  Diagnosis Date  . CAD (coronary artery disease)   . History of nuclear stress test 11/22/2011   bruce myoview; normal pattern of perfusio; post-stress EF 76%; low risk scan  . Hyperlipidemia   . Hypertension   . 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 Type 2 diabetes mellitus (HCC)      Social History   Social History  . Marital status: Widowed    Spouse name: N/A  . Number of children: 7  . Years of education: N/A   Occupational History  . Not on file.   Social History Main Topics  . Smoking status: Never Smoker  . Smokeless tobacco: Not on file     Comment: quit 1960's  smoked very lightly.  . Alcohol use No  . Drug use: No  . Sexual activity:  Not on file   Other Topics Concern  . Not on file   Social History Narrative   Married.   Retired.   Works as a Engineer, structuralCaregiver.    Enjoys helping other.     Past Surgical History:  Procedure Laterality Date  . CARDIAC CATHETERIZATION  05/07/2007   EF 55%, focal mild hypocontractility in mid-distal inferior wall & mid posterolateral wall; severe multivessel CAD - susequent CABGx6 (Dr. Bishop Limbo. [place])  . CORONARY ARTERY BYPASS GRAFT  05/09/2007   LIMA to LAD, veing to intermediate; SVG to OM1 & OM2; SVG to acute marginal & distal RCA (Dr. Dorris FetchHendrickson)  . TRANSTHORACIC ECHOCARDIOGRAM  02/13/2010   EF =>55%, vigorous contraction EF 65%; LA mild-mod dilated; IV normal diameter - normal CVP; trace MR; mild TR; trace AV regurg    Family History  Problem Relation Age of Onset  . Cancer Mother   . Diabetes Sister   . Cancer Child     No Known Allergies  Current Outpatient Prescriptions on File Prior to Visit  Medication Sig Dispense Refill  . aspirin 81 MG tablet Take 81 mg by mouth daily.    Marland Kitchen. glipiZIDE (GLUCOTROL XL) 10 MG 24 hr tablet TAKE 1 TABLET BY MOUTH ONCE A DAY WITH BREAKFAST 90 tablet 1  . glucose blood (ACCU-CHEK ACTIVE STRIPS) test strip Use as instructed to  test blood sugar 3 times daily. 100 each 11  . Insulin Detemir (LEVEMIR) 100 UNIT/ML Pen Inject 15 units in the morning and 15 units in the evening. This is a total of 30 units.    . Insulin Pen Needle (ULTICARE MINI PEN NEEDLES) 31G X 6 MM MISC Use pen needles to inject levemir Dx E11.65 100 each 3  . Lancets (ACCU-CHEK SAFE-T PRO) lancets Use as instructed to test blood sugar 3 times daily. 100 each 11  . metoprolol succinate (TOPROL-XL) 50 MG 24 hr tablet Take 1 tablet (50 mg total) by mouth daily. Take with or immediately following a meal. 30 tablet 6  . Multiple Vitamin (MULTIVITAMIN) capsule Take 1 capsule by mouth daily.    . pantoprazole (PROTONIX) 40 MG tablet TAKE 1 TABLET BY MOUTH DAILY 30 tablet 0  . Polyethyl  Glycol-Propyl Glycol (SYSTANE OP) Apply 2 drops to eye as needed.    . rosuvastatin (CRESTOR) 10 MG tablet Take 1 tablet (10 mg total) by mouth daily. 90 tablet 2  . TRAVATAN Z 0.004 % SOLN ophthalmic solution Place 1 drop into both eyes at bedtime.     No current facility-administered medications on file prior to visit.     BP 140/76   Pulse 64   Temp 97.7 F (36.5 C) (Oral)   Ht 5\' 4"  (1.626 m)   Wt 114 lb 12.8 oz (52.1 kg)   SpO2 97%   BMI 19.71 kg/m    Objective:   Physical Exam  Constitutional: She appears well-nourished.  Neck: Neck supple.  Cardiovascular: Normal rate and regular rhythm.   Pulmonary/Chest: Effort normal and breath sounds normal.  Skin: Skin is warm and dry.          Assessment & Plan:

## 2016-01-03 NOTE — Progress Notes (Signed)
Pre visit review using our clinic review tool, if applicable. No additional management support is needed unless otherwise documented below in the visit note. 

## 2016-01-03 NOTE — Assessment & Plan Note (Signed)
Due for cardiology follow up in early 2018. No chest pain, dizziness, weakness.

## 2016-01-03 NOTE — Patient Instructions (Signed)
Complete lab work prior to leaving today.   Continue Levemir 30 units. Start injecting this at bedtime rather than in the morning. Continue Glipizide XL 10 mg tablets once daily for diabetes.   We will call you once we receive your lab results.  It was a pleasure to see you today!  Diabetes Mellitus and Food It is important for you to manage your blood sugar (glucose) level. Your blood glucose level can be greatly affected by what you eat. Eating healthier foods in the appropriate amounts throughout the day at about the same time each day will help you control your blood glucose level. It can also help slow or prevent worsening of your diabetes mellitus. Healthy eating may even help you improve the level of your blood pressure and reach or maintain a healthy weight. General recommendations for healthful eating and cooking habits include:  Eating meals and snacks regularly. Avoid going long periods of time without eating to lose weight.  Eating a diet that consists mainly of plant-based foods, such as fruits, vegetables, nuts, legumes, and whole grains.  Using low-heat cooking methods, such as baking, instead of high-heat cooking methods, such as deep frying. Work with your dietitian to make sure you understand how to use the Nutrition Facts information on food labels. How can food affect me? Carbohydrates  Carbohydrates affect your blood glucose level more than any other type of food. Your dietitian will help you determine how many carbohydrates to eat at each meal and teach you how to count carbohydrates. Counting carbohydrates is important to keep your blood glucose at a healthy level, especially if you are using insulin or taking certain medicines for diabetes mellitus. Alcohol  Alcohol can cause sudden decreases in blood glucose (hypoglycemia), especially if you use insulin or take certain medicines for diabetes mellitus. Hypoglycemia can be a life-threatening condition. Symptoms of  hypoglycemia (sleepiness, dizziness, and disorientation) are similar to symptoms of having too much alcohol. If your health care provider has given you approval to drink alcohol, do so in moderation and use the following guidelines:  Women should not have more than one drink per day, and men should not have more than two drinks per day. One drink is equal to:  12 oz of beer.  5 oz of wine.  1 oz of hard liquor.  Do not drink on an empty stomach.  Keep yourself hydrated. Have water, diet soda, or unsweetened iced tea.  Regular soda, juice, and other mixers might contain a lot of carbohydrates and should be counted. What foods are not recommended? As you make food choices, it is important to remember that all foods are not the same. Some foods have fewer nutrients per serving than other foods, even though they might have the same number of calories or carbohydrates. It is difficult to get your body what it needs when you eat foods with fewer nutrients. Examples of foods that you should avoid that are high in calories and carbohydrates but low in nutrients include:  Trans fats (most processed foods list trans fats on the Nutrition Facts label).  Regular soda.  Juice.  Candy.  Sweets, such as cake, pie, doughnuts, and cookies.  Fried foods. What foods can I eat? Eat nutrient-rich foods, which will nourish your body and keep you healthy. The food you should eat also will depend on several factors, including:  The calories you need.  The medicines you take.  Your weight.  Your blood glucose level.  Your blood pressure level.  Your cholesterol level. You should eat a variety of foods, including:  Protein.  Lean cuts of meat.  Proteins low in saturated fats, such as fish, egg whites, and beans. Avoid processed meats.  Fruits and vegetables.  Fruits and vegetables that may help control blood glucose levels, such as apples, mangoes, and yams.  Dairy products.  Choose  fat-free or low-fat dairy products, such as milk, yogurt, and cheese.  Grains, bread, pasta, and rice.  Choose whole grain products, such as multigrain bread, whole oats, and brown rice. These foods may help control blood pressure.  Fats.  Foods containing healthful fats, such as nuts, avocado, olive oil, canola oil, and fish. Does everyone with diabetes mellitus have the same meal plan? Because every person with diabetes mellitus is different, there is not one meal plan that works for everyone. It is very important that you meet with a dietitian who will help you create a meal plan that is just right for you. This information is not intended to replace advice given to you by your health care provider. Make sure you discuss any questions you have with your health care provider. Document Released: 09/28/2004 Document Revised: 06/09/2015 Document Reviewed: 11/28/2012 Elsevier Interactive Patient Education  2017 [employer].

## 2016-01-04 NOTE — Addendum Note (Signed)
Addended by: Tawnya CrookSAMBATH, Zuleyma Scharf on: 01/04/2016 01:11 PM   Modules accepted: Orders

## 2016-01-24 ENCOUNTER — Other Ambulatory Visit: Payer: Self-pay | Admitting: Primary Care

## 2016-01-24 ENCOUNTER — Other Ambulatory Visit: Payer: Self-pay | Admitting: Cardiovascular Disease

## 2016-01-24 DIAGNOSIS — E785 Hyperlipidemia, unspecified: Secondary | ICD-10-CM

## 2016-01-24 NOTE — Telephone Encounter (Signed)
Rx has been sent to the pharmacy electronically. ° °

## 2016-01-25 ENCOUNTER — Ambulatory Visit (INDEPENDENT_AMBULATORY_CARE_PROVIDER_SITE_OTHER): Payer: Medicare HMO | Admitting: Cardiovascular Disease

## 2016-01-25 VITALS — BP 122/68 | HR 77 | Ht 64.0 in | Wt 112.6 lb

## 2016-01-25 DIAGNOSIS — E785 Hyperlipidemia, unspecified: Secondary | ICD-10-CM

## 2016-01-25 DIAGNOSIS — I10 Essential (primary) hypertension: Secondary | ICD-10-CM | POA: Diagnosis not present

## 2016-01-25 DIAGNOSIS — I2510 Atherosclerotic heart disease of native coronary artery without angina pectoris: Secondary | ICD-10-CM

## 2016-01-25 DIAGNOSIS — E1165 Type 2 diabetes mellitus with hyperglycemia: Secondary | ICD-10-CM

## 2016-01-25 DIAGNOSIS — K219 Gastro-esophageal reflux disease without esophagitis: Secondary | ICD-10-CM

## 2016-01-25 NOTE — Patient Instructions (Signed)
Your physician wants you to follow-up in: 6 months or sooner if needed. You will receive a reminder letter in the mail two months in advance. If you don't receive a letter, please call our office to schedule the follow-up appointment.   If you need a refill on your cardiac medications before your next appointment, please call your pharmacy. 

## 2016-01-26 ENCOUNTER — Encounter: Payer: Self-pay | Admitting: Cardiovascular Disease
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
## 2016-03-11 ENCOUNTER — Emergency Department
Admission: EM | Admit: 2016-03-11 | Discharge: 2016-03-11 | Disposition: A | Discharge status: Home / Self Care | Payer: No Typology Code available for payment source | Attending: Emergency Medicine | Admitting: Emergency Medicine

## 2016-03-11 ENCOUNTER — Emergency Department: Payer: No Typology Code available for payment source

## 2016-03-11 DIAGNOSIS — Y9389 Activity, other specified: Secondary | ICD-10-CM | POA: Insufficient documentation

## 2016-03-11 DIAGNOSIS — I2510 Atherosclerotic heart disease of native coronary artery without angina pectoris: Secondary | ICD-10-CM | POA: Diagnosis not present

## 2016-03-11 DIAGNOSIS — Z7982 Long term (current) use of aspirin: Secondary | ICD-10-CM | POA: Insufficient documentation

## 2016-03-11 DIAGNOSIS — Z794 Long term (current) use of insulin: Secondary | ICD-10-CM | POA: Diagnosis not present

## 2016-03-11 DIAGNOSIS — Y92410 Unspecified street and highway as the place of occurrence of the external cause: Secondary | ICD-10-CM | POA: Insufficient documentation

## 2016-03-11 DIAGNOSIS — E119 Type 2 diabetes mellitus without complications: Secondary | ICD-10-CM | POA: Diagnosis not present

## 2016-03-11 DIAGNOSIS — Y999 Unspecified external cause status: Secondary | ICD-10-CM | POA: Diagnosis not present

## 2016-03-11 DIAGNOSIS — I10 Essential (primary) hypertension: Secondary | ICD-10-CM | POA: Diagnosis not present

## 2016-03-11 DIAGNOSIS — S3992XA Unspecified injury of lower back, initial encounter: Secondary | ICD-10-CM | POA: Diagnosis present

## 2016-03-11 DIAGNOSIS — S39012A Strain of muscle, fascia and tendon of lower back, initial encounter: Secondary | ICD-10-CM | POA: Insufficient documentation
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
## 2016-03-11 NOTE — ED Notes (Signed)
NAD noted at time of D/C. Pt denies questions or concerns. Pt ambulatory to the lobby at this time.  

## 2016-03-19 ENCOUNTER — Other Ambulatory Visit: Payer: Self-pay | Admitting: Cardiovascular Disease

## 2016-03-19 NOTE — Telephone Encounter (Signed)
Rx has been sent to the pharmacy electronically. ° °

## 2016-04-02 ENCOUNTER — Encounter: Payer: Self-pay | Admitting: Primary Care

## 2016-04-02 ENCOUNTER — Ambulatory Visit (INDEPENDENT_AMBULATORY_CARE_PROVIDER_SITE_OTHER): Payer: Medicare HMO | Admitting: Primary Care

## 2016-04-02 ENCOUNTER — Telehealth: Payer: Self-pay | Admitting: Primary Care

## 2016-04-02 ENCOUNTER — Other Ambulatory Visit: Payer: Self-pay | Admitting: Primary Care

## 2016-04-02 VITALS — BP 118/70 | HR 69 | Temp 97.6°F | Ht 64.0 in | Wt 115.8 lb

## 2016-04-02 DIAGNOSIS — E118 Type 2 diabetes mellitus with unspecified complications: Secondary | ICD-10-CM

## 2016-04-02 DIAGNOSIS — Z794 Long term (current) use of insulin: Secondary | ICD-10-CM | POA: Diagnosis not present

## 2016-04-02 DIAGNOSIS — I10 Essential (primary) hypertension: Secondary | ICD-10-CM

## 2016-04-02 DIAGNOSIS — E785 Hyperlipidemia, unspecified: Secondary | ICD-10-CM

## 2016-04-02 DIAGNOSIS — E119 Type 2 diabetes mellitus without complications: Secondary | ICD-10-CM

## 2016-04-02 LAB — LIPID PANEL
CHOL/HDL RATIO: 2
CHOLESTEROL: 140 mg/dL (ref 0–200)
HDL: 76.8 mg/dL (ref >39.00)
LDL Cholesterol: 50 mg/dL (ref 0–99)
NonHDL: 62.88
Triglycerides: 64 mg/dL (ref 0.0–149.0)
VLDL: 12.8 mg/dL (ref 0.0–40.0)

## 2016-04-02 LAB — COMPREHENSIVE METABOLIC PANEL
ALBUMIN: 3.9 g/dL (ref 3.5–5.2)
ALT: 21 U/L (ref 0–35)
AST: 18 U/L (ref 0–37)
Alkaline Phosphatase: 74 U/L (ref 39–117)
BUN: 28 mg/dL — ABNORMAL HIGH (ref 6–23)
CALCIUM: 9.7 mg/dL (ref 8.4–10.5)
CO2: 26 meq/L (ref 19–32)
CREATININE: 1.6 mg/dL — AB (ref 0.40–1.20)
Chloride: 100 mEq/L (ref 96–112)
GFR: 39.71 mL/min — AB (ref >60.00)
Glucose, Bld: 394 mg/dL — ABNORMAL HIGH (ref 70–99)
Potassium: 4.5 mEq/L (ref 3.5–5.1)
Sodium: 134 mEq/L — ABNORMAL LOW (ref 135–145)
TOTAL PROTEIN: 6.8 g/dL (ref 6.0–8.3)
Total Bilirubin: 0.5 mg/dL (ref 0.2–1.2)

## 2016-04-02 LAB — HEMOGLOBIN A1C: HEMOGLOBIN A1C: 11.7 % — AB (ref 4.6–6.5)

## 2016-04-02 NOTE — Progress Notes (Signed)
Subjective:    Patient ID: Laura Ferguson, female    DOB: 11/13/1932, 83 y.o.   MRN: [id number]  HPI  [name] is an 83 year old female who presents today for follow up.  1) Type 2 Diabetes: Uncontrolled. Currently managed on Levemir 20 units every morning and 20 units HS, Glipizidie XL 10 mg. Her last A1C was 10 in December 2017, she is due for recheck today.   She endorses compliance to her Levemir and is injecting 30 units in the morning and "sometimes" in the evening. She's checking her blood sugars irregularly and is getting fasting readings ranging 130's-230's, 200's in the afternoons, and mid 200's in the evening.   She visited with her cardiologist in January 2018 who noted that she hadn't had her insulin in months, patient denies this and reports compliance to her insulin since she established care in our office. She denies numbness/tingling, weakness, dizziness, visual changes, polydipsia. She feels great otherwise.  Diet currently consists of:  Breakfast: Apple sauce, egg and cheese sandwich Lunch: Skips Dinner: Sandwich, chips Snacks: None Desserts: Occasionally Beverages: Coffee, water (4 bottles daily), milk  Exercise: She does not exercise.   2) Essential Hypertension: Currently managed on valsartan-HCTZ 80/12.5 mg, Toprol XL 50 mg. Her BP in the office today is 118/70. She denies chest pain, dizziness, weakness.  Review of Systems  Constitutional: Negative for fatigue.  Eyes: Negative for visual disturbance.  Respiratory: Negative for shortness of breath.   Cardiovascular: Negative for chest pain.  Endocrine: Negative for polydipsia.  Neurological: Negative for dizziness and numbness.       Past Medical History:  Diagnosis Date  . CAD (coronary artery disease)   . History of nuclear stress test 11/22/2011   bruce myoview; normal pattern of perfusio; post-stress EF 76%; low risk scan  . Hyperlipidemia   . Hypertension   . 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 Type 2 diabetes mellitus (HCC)      Social History   Social History  . Marital status: Widowed    Spouse name: N/A  . Number of children: 7  . Years of education: N/A   Occupational History  . Not on file.   Social History Main Topics  . Smoking status: Never Smoker  . Smokeless tobacco: Never Used     Comment: quit 1960's  smoked very lightly.  . Alcohol use No  . Drug use: No  . Sexual activity: Not on file   Other Topics Concern  . Not on file   Social History Narrative   Married.   Retired.   Works as a Engineer, structuralCaregiver.    Enjoys helping other.     Past Surgical History:  Procedure Laterality Date  . CARDIAC CATHETERIZATION  05/07/2007   EF 55%, focal mild hypocontractility in mid-distal inferior wall & mid posterolateral wall; severe multivessel CAD - susequent CABGx6 (Dr. Bishop Limbo. [place])  . CORONARY ARTERY BYPASS GRAFT  05/09/2007   LIMA to LAD, veing to intermediate; SVG to OM1 & OM2; SVG to acute marginal & distal RCA (Dr. Dorris FetchHendrickson)  . TRANSTHORACIC ECHOCARDIOGRAM  02/13/2010   EF =>55%, vigorous contraction EF 65%; LA mild-mod dilated; IV normal diameter - normal CVP; trace MR; mild TR; trace AV regurg    Family History  Problem Relation Age of Onset  . Cancer Mother   . Diabetes  Sister   . Cancer Child     No Known Allergies  Current Outpatient Prescriptions on File Prior to Visit  Medication Sig Dispense Refill  . aspirin 81 MG tablet Take 81 mg by mouth daily.    Marland Kitchen glipiZIDE (GLUCOTROL XL) 10 MG 24 hr tablet TAKE 1 TABLET BY MOUTH ONCE A DAY WITH BREAKFAST 90 tablet 1  . glucose blood (ACCU-CHEK ACTIVE STRIPS) test strip Use as instructed to test blood sugar 3 times daily. 100 each 11  . Insulin Detemir (LEVEMIR) 100 UNIT/ML Pen inject 30 units in the morning and 30 units in the evening. This is a total of 60 units.    . Insulin Pen Needle (ULTICARE MINI PEN NEEDLES) 31G X  6 MM MISC Use pen needles to inject levemir Dx E11.65 100 each 3  . Lancets (ACCU-CHEK SAFE-T PRO) lancets Use as instructed to test blood sugar 3 times daily. 100 each 11  . metoprolol succinate (TOPROL-XL) 50 MG 24 hr tablet TAKE 1 TABLET BY MOUTH ONCE A DAY WITH OR IMMEDIATELY FOLLOWING A MEAL 30 tablet 2  . Multiple Vitamin (MULTIVITAMIN) capsule Take 1 capsule by mouth daily.    . pantoprazole (PROTONIX) 40 MG tablet TAKE 1 TABLET BY MOUTH DAILY 30 tablet 11  . Polyethyl Glycol-Propyl Glycol (SYSTANE OP) Apply 2 drops to eye as needed.    . rosuvastatin (CRESTOR) 10 MG tablet TAKE 1 TABLET BY MOUTH ONCE A DAY 90 tablet 1  . TRAVATAN Z 0.004 % SOLN ophthalmic solution Place 1 drop into both eyes at bedtime.    . valsartan-hydrochlorothiazide (DIOVAN-HCT) 80-12.5 MG tablet Take 1 tablet by mouth daily. 90 tablet 3   No current facility-administered medications on file prior to visit.     BP 118/70   Pulse 69   Temp 97.6 F (36.4 C) (Oral)   Ht 5\' 4"  (1.626 m)   Wt 115 lb 12.8 oz (52.5 kg)   SpO2 99%   BMI 19.88 kg/m    Objective:   Physical Exam  Constitutional: She is oriented to person, place, and time. She appears well-nourished.  Neck: Neck supple.  Cardiovascular: Normal rate and regular rhythm.   Pulmonary/Chest: Effort normal and breath sounds normal.  Neurological: She is alert and oriented to person, place, and time.  Skin: Skin is warm and dry.  Psychiatric: She has a normal mood and affect.          Assessment & Plan:

## 2016-04-02 NOTE — Patient Instructions (Addendum)
Complete lab work prior to leaving today. I will notify you of your results once received.   You must inject your insulin everyday as discussed. We will be in touch with you once we receive your A1C (diabetes) and cholesterol results.  Continue Levemir insulin 20 units in the morning and 20 units at bedtime.  It was a pleasure to see you today!

## 2016-04-02 NOTE — Telephone Encounter (Signed)
Pot returned your call  Please call back thanks

## 2016-04-02 NOTE — Assessment & Plan Note (Signed)
Stable today, continue current regimen. 

## 2016-04-02 NOTE — Progress Notes (Signed)
Pre visit review using our clinic review tool, if applicable. No additional management support is needed unless otherwise documented below in the visit note. 

## 2016-04-02 NOTE — Assessment & Plan Note (Addendum)
Due for repeat A1C today. Endorses compliance to Levemir since establishing care in our office, cardiology note from 01/2016 indicates otherwise. Seriously question compliance, confusion/early dementia, although she answers all questions correctly today.  If she's injecting Levemir as she endorses today, then A1C should be significantly improved. If no improvement, will refer to endocrinology given lack of improvement in our office. Again, suspect non compliance.   Did note clerical error in chart this morning, she is to be on Levemir 20 units BID, chart states 30 units BID. She's actually injecting 30 units AM, rarely in the evening. Discussed correct directions of 20 units BID, she verbalized understanding.

## 2016-04-02 NOTE — Assessment & Plan Note (Signed)
Repeat lipids pending today. Continue Crestor 10 mg.

## 2016-04-03 NOTE — Telephone Encounter (Signed)
Patient returned Chan's call. °

## 2016-04-03 NOTE — Telephone Encounter (Signed)
Spoken and notified patient of Kate's comments. Patient verbalized understanding. 

## 2016-04-20 ENCOUNTER — Other Ambulatory Visit: Payer: Self-pay | Admitting: Cardiovascular Disease

## 2016-04-20 NOTE — Telephone Encounter (Signed)
REFILL 

## 2016-05-04 ENCOUNTER — Other Ambulatory Visit (INDEPENDENT_AMBULATORY_CARE_PROVIDER_SITE_OTHER): Payer: Medicare HMO

## 2016-05-04 DIAGNOSIS — N289 Disorder of kidney and ureter, unspecified: Secondary | ICD-10-CM | POA: Diagnosis not present

## 2016-05-04 LAB — BASIC METABOLIC PANEL
BUN: 32 mg/dL — ABNORMAL HIGH (ref 6–23)
CHLORIDE: 106 meq/L (ref 96–112)
CO2: 26 meq/L (ref 19–32)
Calcium: 10.3 mg/dL (ref 8.4–10.5)
Creatinine, Ser: 1.4 mg/dL — ABNORMAL HIGH (ref 0.40–1.20)
GFR: 46.31 mL/min — ABNORMAL LOW (ref >60.00)
GLUCOSE: 240 mg/dL — AB (ref 70–99)
Potassium: 5.1 mEq/L (ref 3.5–5.1)
SODIUM: 138 meq/L (ref 135–145)

## 2016-06-08 ENCOUNTER — Telehealth: Payer: Self-pay | Admitting: Internal Medicine

## 2016-06-08 ENCOUNTER — Encounter: Payer: Self-pay | Admitting: Internal Medicine

## 2016-06-08 ENCOUNTER — Ambulatory Visit (INDEPENDENT_AMBULATORY_CARE_PROVIDER_SITE_OTHER): Payer: Medicare HMO | Admitting: Internal Medicine

## 2016-06-08 ENCOUNTER — Encounter: Payer: Self-pay | Admitting: Dietician

## 2016-06-08 ENCOUNTER — Encounter: Payer: Medicare HMO | Attending: Internal Medicine | Admitting: Dietician

## 2016-06-08 VITALS — BP 130/70 | HR 75 | Ht 64.0 in | Wt 113.0 lb

## 2016-06-08 DIAGNOSIS — Z794 Long term (current) use of insulin: Secondary | ICD-10-CM

## 2016-06-08 DIAGNOSIS — E1159 Type 2 diabetes mellitus with other circulatory complications: Secondary | ICD-10-CM | POA: Diagnosis not present

## 2016-06-08 DIAGNOSIS — F329 Major depressive disorder, single episode, unspecified: Secondary | ICD-10-CM | POA: Insufficient documentation

## 2016-06-08 DIAGNOSIS — I10 Essential (primary) hypertension: Secondary | ICD-10-CM | POA: Diagnosis not present

## 2016-06-08 DIAGNOSIS — Z713 Dietary counseling and surveillance: Secondary | ICD-10-CM | POA: Diagnosis not present

## 2016-06-08 DIAGNOSIS — Z79899 Other long term (current) drug therapy: Secondary | ICD-10-CM | POA: Insufficient documentation

## 2016-06-08 DIAGNOSIS — Z681 Body mass index (BMI) 19 or less, adult: Secondary | ICD-10-CM | POA: Insufficient documentation

## 2016-06-08 DIAGNOSIS — E119 Type 2 diabetes mellitus without complications: Secondary | ICD-10-CM | POA: Diagnosis not present

## 2016-06-08 DIAGNOSIS — E785 Hyperlipidemia, unspecified: Secondary | ICD-10-CM | POA: Insufficient documentation

## 2016-06-08 MED ORDER — INSULIN ASPART 100 UNIT/ML FLEXPEN: 6.0000 [IU] | PEN_INJECTOR | Freq: Three times a day (TID) | SUBCUTANEOUS | 11 refills | Status: DC

## 2016-06-08 MED ORDER — LEVEMIR FLEXTOUCH 100 UNIT/ML ~~LOC~~ SOPN: 30.0000 [IU] | PEN_INJECTOR | Freq: Every day | SUBCUTANEOUS | 3 refills | Status: DC

## 2016-06-08 MED ORDER — INSULIN PEN NEEDLE 32G X 4 MM MISC: 4 refills | Status: DC

## 2016-06-08 NOTE — Patient Instructions (Addendum)
Remember to take your medication. Avoid drinking beverages with sugar. (choose water, G2 gatorade, unsweetened tea instead). Avoid processed meat.  Breakfast, lunch, dinner daily.  Remember to take the Novolog 10 minutes prior to eating. Avoid skipping meals.  You need the nutrition. Aim to have 1 hot meal per day.  If your blood sugar drops below 70, then drink 1/2 cup regular soda or juice or take 3-4 glucose tabs (you can get these at your pharmacy.  Keep glucose tabs in your purse and at your bedside table.  Consider Glucerna, Boost Glucose Control OR Carnation Breakfast Light Start when you cannot eat a meal.

## 2016-06-08 NOTE — Telephone Encounter (Signed)
Laura Ferguson called advising that she wanted to make sure that her mother did not get turned away due to not being able to pay the copay over the phone. I advised her that she would not be turned away due to not paying a copay and would be billed. [name] stated that she may call back at 2:15 to make sure.

## 2016-06-08 NOTE — Progress Notes (Signed)
Diabetes Self-Management Education  Visit Type: First/Initial  Appt. Start Time: 1500 Appt. End Time: 1600  06/08/2016  Laura Ferguson, identified by name and date of birth, is a 83 y.o. female with a diagnosis of Diabetes: Type 2 (acts like type 1).  Other hx includes HTN, hyperlipidemia and open heart surgery 10 years ago.  Her GFR 04/20/16 was 46.  Her A1C's have consistently been high for >1 year.  (9.5-16%).  She reports that her primary care physician passed away 6 months ago.  She has significant depression.  She has a poor appetite and usually skips lunch.  Weight 113 lbs today and usually 112-115 and states that she has been small her whole life.  Stress decreases her appetite further.  She does drink some regular soda and gatorade.  Patient lives with 2 of her sons.  One of her son's is an alcoholic.  She lost another son 1 year ago due to diabetes.  A neighbor died of suicide 2 weeks ago.  She cooks meals for her son's but usually does not eat them and often eats a sandwich. 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  Medications include:  Levemier (dose changed today to 30 units q am), Novolog (added today) 6-8 units prior to meals depending on the size of the meal.  She is to skip the novolog if she skips the meal.  Glipizide was discontinued.  ASSESSMENT  Height 5\' 5"  (1.651 m), weight 113 lb (51.3 kg). Body mass index is 18.8 kg/m.  She appears strong but frail and very thin.      Diabetes Self-Management Education - 06/08/16 1521      Visit Information   Visit Type First/Initial     Initial Visit   Diabetes Type Type 2  acts like type 1   Are you currently following a meal plan? No   Are you taking your medications as prescribed? Yes   Date Diagnosed 25 years ago     Health Coping   How would you rate your overall health? Good     Psychosocial Assessment   Patient Belief/Attitude about Diabetes Other (comment)  Worried   Self-care barriers None   Self-management support Church;Doctor's office   Other persons present Patient   Patient Concerns Nutrition/Meal planning;Glycemic Control   Special Needs None   Preferred Learning Style No preference indicated   Learning Readiness Ready   How often do you need to have someone help you when you read instructions, pamphlets, or other written materials from your doctor or pharmacy? 1 - Never   What is the last grade level you completed in school? 12th grade     Pre-Education Assessment   Patient understands the diabetes disease and treatment process. Needs Review   Patient understands incorporating nutritional management into lifestyle. Needs Review   Patient undertands incorporating physical activity into lifestyle. Needs Review   Patient understands using medications safely. Needs Review   Patient understands monitoring blood glucose, interpreting and using results Needs Review   Patient understands prevention, detection, and treatment of acute complications. Needs Review   Patient understands prevention, detection, and treatment of chronic complications. Needs Review   Patient understands how to develop strategies to address psychosocial issues. Needs Review   Patient understands how to develop strategies to promote health/change behavior. Needs Review     Complications   Last HgB A1C per patient/outside source 11.7 %  04/02/16   How often do you check your blood sugar?  3-4 times/day   Fasting Blood glucose range (mg/dL) 16-109;>604;540-981;191-478   Postprandial Blood glucose range (mg/dL) 295-621;>308   Number of hypoglycemic episodes per month 0   Number of hyperglycemic episodes per week 21   Can you tell when your blood sugar is high? Yes   Have you had a dilated eye exam in the past 12 months? Yes   Have you had a dental exam in the past 12 months? Yes   Are you checking your feet? Yes   How many days per week are you checking your feet? 7     Dietary Intake   Breakfast 1  slice ham, 1 slice bread, mayo, coffee with 1 T sugar and powdered creamer  7   Snack (morning) rare   Lunch skips- not hungry   Snack (afternoon) rare   Dinner sandwich OR salad OR vegetable soup and sandwich  5   Snack (evening) sherbet or fruit   Beverage(s) water, Regular soda, diet soda, juice, gatorade, lactaid milk, sweet tea rarely or unsweetened tea     Exercise   Exercise Type Light (walking / raking leaves)  cleans her own house, rakes leaves     Patient Education   Previous Diabetes Education No   Disease state  Definition of diabetes, type 1 and 2, and the diagnosis of diabetes   Nutrition management  Meal options for control of blood glucose level and chronic complications.;Meal timing in regards to the patients' current diabetes medication.   Medications Reviewed patients medication for diabetes, action, purpose, timing of dose and side effects.   Monitoring Identified appropriate SMBG and/or A1C goals.   Acute complications Taught treatment of hypoglycemia - the 15 rule.   Chronic complications Identified and discussed with patient  current chronic complications   Psychosocial adjustment Worked with patient to identify barriers to care and solutions;Role of stress on diabetes     Individualized Goals (developed by patient)   Nutrition General guidelines for healthy choices and portions discussed   Medications take my medication as prescribed   Monitoring  test my blood glucose as discussed   Problem Solving reducing stress and improving nutrition intake   Reducing Risk treat hypoglycemia with 15 grams of carbs if blood glucose less than 70mg /dL;Other (comment)  avoid skipping meals   Health Coping discuss diabetes with (comment)  MD/RD     Post-Education Assessment   Patient understands the diabetes disease and treatment process. Demonstrates understanding / competency   Patient understands incorporating nutritional management into lifestyle. Needs Review    Patient undertands incorporating physical activity into lifestyle. Demonstrates understanding / competency   Patient understands using medications safely. Needs Review   Patient understands monitoring blood glucose, interpreting and using results Demonstrates understanding / competency   Patient understands prevention, detection, and treatment of acute complications. Demonstrates understanding / competency   Patient understands prevention, detection, and treatment of chronic complications. Demonstrates understanding / competency   Patient understands how to develop strategies to address psychosocial issues. Needs Review   Patient understands how to develop strategies to promote health/change behavior. Demonstrates understanding / competency     Outcomes   Expected Outcomes Demonstrated interest in learning. Expect positive outcomes   Future DMSE PRN   Program Status Completed      Individualized Plan for Diabetes Self-Management Training:   Learning Objective:  Patient will have a greater understanding of diabetes self-management. Patient education plan is to attend individual and/or group sessions per assessed needs and concerns.   Plan:  Provided patient with a list of local counselors and recommended that she call.  Patient Instructions  Remember to take your medication. Avoid drinking beverages with sugar. (choose water, G2 gatorade, unsweetened tea instead). Avoid processed meat.  Breakfast, lunch, dinner daily.  Remember to take the Novolog 10 minutes prior to eating. Avoid skipping meals.  You need the nutrition. Aim to have 1 hot meal per day.  If your blood sugar drops below 70, then drink 1/2 cup regular soda or juice or take 3-4 glucose tabs (you can get these at your pharmacy.  Keep glucose tabs in your purse and at your bedside table.  Consider Glucerna, Boost Glucose Control OR Carnation Breakfast Light Start when you cannot eat a meal.   Expected Outcomes:   Demonstrated interest in learning. Expect positive outcomes  Education material provided: Living Well with Diabetes and My Plate  If problems or questions, patient to contact team via:  Phone  Future DSME appointment: PRN

## 2016-06-08 NOTE — Telephone Encounter (Signed)
Patient's daughter trying to call to pay co-pay over the phone before her visit.

## 2016-06-08 NOTE — Progress Notes (Signed)
Patient ID: Laura Ferguson, female   DOB: Apr 16, 1932, 83 y.o.   MRN: [id number]   HPI: Laura SansBetty C Kittler is a 83 y.o.-year-old female, referred by her PCP,  Doreene Nestlark, Katherine K, NP for management of DM2, dx in 1990s, insulin-dependent for years, uncontrolled, with complications (CAD - s/p CABG x6, CKD, DR, PN).  Last hemoglobin A1c was: Lab Results  Component Value Date   HGBA1C 11.7 (H) 04/02/2016   HGBA1C 10.0 (H) 01/03/2016   HGBA1C 9.5 (H) 09/30/2015   Pt is on a regimen of: - Levemir 20 units in am and 20 units at night (skips if sugars are lower) - Glipizide XL 10 mg daily in am  Pt checks her sugars 1x a day and they are: - am: 196, 251, 431 - 2h after b'fast:  - before lunch: 280, 402 - 2h after lunch: n/c - before dinner: 151 - 2h after dinner: 408 - bedtime: 217, 209 - nighttime: n/c No lows. Lowest sugar was 91; ? hypoglycemia awareness.  Highest sugar was 500s.  Glucometer: AccuChek  Pt's meals are: - Breakfast: oatmeal, grits, apple sauce - Lunch: tuna sandwich, bologna - Dinner: soup, sandwich - Snacks: no   - + CKD, last BUN/creatinine:  Lab Results  Component Value Date   BUN 32 (H) 05/04/2016   BUN 28 (H) 04/02/2016   CREATININE 1.40 (H) 05/04/2016   CREATININE 1.60 (H) 04/02/2016  On Valsartan. - last set of lipids: Lab Results  Component Value Date   CHOL 140 04/02/2016   HDL 76.80 04/02/2016   LDLCALC 50 04/02/2016   TRIG 64.0 04/02/2016   CHOLHDL 2 04/02/2016  On Crestor. Cardiologist: Dr. Tresa EndoKelly.  - last eye exam was in 2017. + DR. New eye exam coming up. - + numbness and tingling in her feet.  Pt has FH of DM in [place], sister - died from DM complications (refused to start HD), sons - 1 son died from DM complications.  She also has HL, HTN.  ROS: Constitutional: + weight gain, + fatigue, + cold intolerance, + poor sleep, + nocturia Eyes: + blurry vision, no xerophthalmia ENT: no sore throat, no nodules palpated in throat, + dysphagia/odynophagia,  no hoarseness, + tinnitus, + hypoacusis Cardiovascular: no CP/SOB/no palpitations/+ leg swelling Respiratory: no cough/SOB Gastrointestinal: + N/no V/D/C, + heartburn Musculoskeletal: + muscle aches/+ joint aches Skin: no rashes, + easy bruising Neurological: no tremors/numbness/tingling/dizziness, no HA Psychiatric: + both: depression/anxiety   Past Medical History:  Diagnosis Date  . CAD (coronary artery disease)   . History of nuclear stress test 11/22/2011   bruce myoview; normal pattern of perfusio; post-stress EF 76%; low risk scan  . Hyperlipidemia   . Hypertension   . 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 Type 2 diabetes mellitus (HCC)    Past Surgical History:  Procedure Laterality Date  . CARDIAC CATHETERIZATION  05/07/2007   EF 55%, focal mild hypocontractility in mid-distal inferior wall & mid posterolateral wall; severe multivessel CAD - susequent CABGx6 (Dr. Bishop Limbo. Kelly)  . CORONARY ARTERY BYPASS GRAFT  05/09/2007   LIMA to LAD, veing to intermediate; SVG to OM1 & OM2; SVG to acute marginal & distal RCA (Dr. Dorris FetchHendrickson)  . TRANSTHORACIC ECHOCARDIOGRAM  02/13/2010   EF =>55%, vigorous contraction EF 65%; LA mild-mod dilated; IV normal diameter - normal CVP; trace MR; mild TR; trace AV regurg  Social History   Social History  . Marital status: Widowed    Spouse name: N/A  . Number of children: 8   Occupational History  . homemaker   Social History Main Topics  . Smoking status: Never Smoker  . Smokeless tobacco: Never Used     Comment: quit 1960's  smoked very lightly.  . Alcohol use No  . Drug use: No     Social History Narrative   Married.   Retired.   Works as a Engineer, structural.    Enjoys helping other.    Current Outpatient Prescriptions on File Prior to Visit  Medication Sig Dispense Refill  . aspirin 81 MG tablet Take 81 mg by mouth daily.    Marland Kitchen glipiZIDE (GLUCOTROL XL)  10 MG 24 hr tablet TAKE 1 TABLET BY MOUTH ONCE A DAY WITH BREAKFAST 90 tablet 1  . glucose blood (ACCU-CHEK ACTIVE STRIPS) test strip Use as instructed to test blood sugar 3 times daily. 100 each 11  . Insulin Pen Needle (ULTICARE MINI PEN NEEDLES) 31G X 6 MM MISC Use pen needles to inject levemir Dx E11.65 100 each 3  . Lancets (ACCU-CHEK SAFE-T PRO) lancets Use as instructed to test blood sugar 3 times daily. 100 each 11  . LEVEMIR FLEXTOUCH 100 UNIT/ML Pen Inject 20 Units into the skin 2 (two) times daily with a meal. 15 mL 3  . metoprolol succinate (TOPROL-XL) 50 MG 24 hr tablet TAKE 1 TABLET BY MOUTH ONCE DAILY WITH OR IMMEDIATELY FOLLOWING A MEAL 30 tablet 6  . Multiple Vitamin (MULTIVITAMIN) capsule Take 1 capsule by mouth daily.    . pantoprazole (PROTONIX) 40 MG tablet TAKE 1 TABLET BY MOUTH DAILY 30 tablet 11  . Polyethyl Glycol-Propyl Glycol (SYSTANE OP) Apply 2 drops to eye as needed.    . rosuvastatin (CRESTOR) 10 MG tablet TAKE 1 TABLET BY MOUTH ONCE A DAY 90 tablet 1  . TRAVATAN Z 0.004 % SOLN ophthalmic solution Place 1 drop into both eyes at bedtime.    . valsartan-hydrochlorothiazide (DIOVAN-HCT) 80-12.5 MG tablet Take 1 tablet by mouth daily. 90 tablet 3   No current facility-administered medications on file prior to visit.    No Known Allergies Family History  Problem Relation Age of Onset  . Cancer Mother   . Diabetes Sister   . Cancer Child     PE: BP 130/70 (BP Location: Left Arm, Patient Position: Sitting)   Pulse 75   Ht 5\' 4"  (1.626 m)   Wt 113 lb (51.3 kg)   SpO2 98%   BMI 19.40 kg/m  Wt Readings from Last 3 Encounters:  06/08/16 113 lb (51.3 kg)  04/02/16 115 lb 12.8 oz (52.5 kg)  03/11/16 112 lb (50.8 kg)   Constitutional: thin, in NAD Eyes: PERRLA, EOMI, no exophthalmos ENT: moist mucous membranes, no thyromegaly, no cervical lymphadenopathy Cardiovascular: RRR, No MRG Respiratory: CTA B Gastrointestinal: abdomen soft, NT, ND,  BS+ Musculoskeletal: no deformities, strength intact in all 4 Skin: moist, warm, no rashes Neurological: no tremor with outstretched hands, DTR normal in all 4  ASSESSMENT: 1. DM2, insulin-dependent, uncontrolled, with complications - CAD - s/p CABG x6 - CKD - DR - PN  PLAN:  1. Patient with long-standing, uncontrolled diabetes, on oral antidiabetic regimen + basal insulin, which became insufficient.Her sugars are widely fluctuating, but mostly much above the target range. We discussed that it does appear that she is not producing much insulin >> will need to start mealtime insulin and stop  Glipizide. I explained how to use this and gave her a flexible regimen. In the meantime, will decrease her basal insulin and have her take all of it in am, to avoid hypoglycemia at night. - she agrees with a referral to nutrition >> Pt was able to be seen today by the nutritionist - I suggested to:  Patient Instructions  Please decrease: - Levemir to 30 units in am  Please start: - Novolog 10-15 minutes before meals: 6 units before a smaller meal 8 units before a larger meal (10 units before a very large meal or if you have dessert)  Stop: - Glipizide  Please let me know if the sugars are consistently <80 or >200.  Please return in July with your sugar log.   - Strongly advised her to start checking sugars at different times of the day - check 3 times a day, rotating checks - given sugar log and advised how to fill it and to bring it at next appt  - given foot care handout and explained the principles  - given instructions for hypoglycemia management "15-15 rule"  - advised for yearly eye exams >> she is due - Return to clinic in 2 mo with sugar log   Carlus Pavlov, MD PhD [facility] Endocrinology

## 2016-06-08 NOTE — Patient Instructions (Addendum)
Please decrease: - Levemir to 30 units in am  Please start: - Novolog 10-15 minutes before meals: 6 units before a smaller meal 8 units before a larger meal (10 units before a very large meal or if you have dessert)  Stop: - Glipizide  Please let me know if the sugars are consistently <80 or >200.  Please return in  July with your sugar log.   PATIENT INSTRUCTIONS FOR TYPE 2 DIABETES:  **Please join MyChart!** - see attached instructions about how to join if you have not done so already.  DIET AND EXERCISE Diet and exercise is an important part of diabetic treatment.  We recommended aerobic exercise in the form of brisk walking (working between 40-60% of maximal aerobic capacity, similar to brisk walking) for 150 minutes per week (such as 30 minutes five days per week) along with 3 times per week performing 'resistance' training (using various gauge rubber tubes with handles) 5-10 exercises involving the major muscle groups (upper body, lower body and core) performing 10-15 repetitions (or near fatigue) each exercise. Start at half the above goal but build slowly to reach the above goals. If limited by weight, joint pain, or disability, we recommend daily walking in a swimming pool with water up to waist to reduce pressure from joints while allow for adequate exercise.    BLOOD GLUCOSES Monitoring your blood glucoses is important for continued management of your diabetes. Please check your blood glucoses 2-4 times a day: fasting, before meals and at bedtime (you can rotate these measurements - e.g. one day check before the 3 meals, the next day check before 2 of the meals and before bedtime, etc.).   HYPOGLYCEMIA (low blood sugar) Hypoglycemia is usually a reaction to not eating, exercising, or taking too much insulin/ other diabetes drugs.  Symptoms include tremors, sweating, hunger, confusion, headache, etc. Treat IMMEDIATELY with 15 grams of Carbs: . 4 glucose tablets .  cup  regular juice/soda . 2 tablespoons raisins . 4 teaspoons sugar . 1 tablespoon honey Recheck blood glucose in 15 mins and repeat above if still symptomatic/blood glucose <100.  RECOMMENDATIONS TO REDUCE YOUR RISK OF DIABETIC COMPLICATIONS: * Take your prescribed MEDICATION(S) * Follow a DIABETIC diet: Complex carbs, fiber rich foods, (monounsaturated and polyunsaturated) fats * AVOID saturated/trans fats, high fat foods, >2,300 mg salt per day. * EXERCISE at least 5 times a week for 30 minutes or preferably daily.  * DO NOT SMOKE OR DRINK more than 1 drink a day. * Check your FEET every day. Do not wear tightfitting shoes. Contact us if you develop an ulcer * See your EYE doctor once a year or more if needed * Get a FLU shot once a year * Get a PNEUMONIA vaccine once before and once after age 81 years  GOALS:  * Your Hemoglobin A1c of <7%  * fasting sugars need to be <130 * after meals sugars need to be <180 (2h after you start eating) * Your Systolic BP should be 140 or lower  * Your Diastolic BP should be 80 or lower  * Your HDL (Good Cholesterol) should be 40 or higher  * Your LDL (Bad Cholesterol) should be 100 or lower. * Your Triglycerides should be 150 or lower  * Your Urine microalbumin (kidney function) should be <30 * Your Body Mass Index should be 25 or lower    Please consider the following ways to cut down carbs and fat and increase fiber and micronutrients in your diet: -  substitute whole grain for white bread or pasta - substitute brown rice for white rice - substitute 90-calorie flat bread pieces for slices of bread when possible - substitute sweet potatoes or yams for white potatoes - substitute humus for margarine - substitute tofu for cheese when possible - substitute almond or rice milk for regular milk (would not drink soy milk daily due to concern for soy estrogen influence on breast cancer risk) - substitute dark chocolate for other sweets when possible -  substitute water - can add lemon or orange slices for taste - for diet sodas (artificial sweeteners will trick your body that you can eat sweets without getting calories and will lead you to overeating and weight gain in the long run) - do not skip breakfast or other meals (this will slow down the metabolism and will result in more weight gain over time)  - can try smoothies made from fruit and almond/rice milk in am instead of regular breakfast - can also try old-fashioned (not instant) oatmeal made with almond/rice milk in am - order the dressing on the side when eating salad at a restaurant (pour less than half of the dressing on the salad) - eat as little meat as possible - can try juicing, but should not forget that juicing will get rid of the fiber, so would alternate with eating raw veg./fruits or drinking smoothies - use as little oil as possible, even when using olive oil - can dress a salad with a mix of balsamic vinegar and lemon juice, for e.g. - use agave nectar, stevia sugar, or regular sugar rather than artificial sweateners - steam or broil/roast veggies  - snack on veggies/fruit/nuts (unsalted, preferably) when possible, rather than processed foods - reduce or eliminate aspartame in diet (it is in diet sodas, chewing gum, etc) Read the labels!  Try to read Dr. Janene Harvey book: "Program for Reversing Diabetes" for other ideas for healthy eating.

## 2016-06-08 NOTE — Telephone Encounter (Signed)
Patient's daughter Laura Ferguson called again inquiring if mother had arrived at the office, she had not. She also wanted to know if we would turn the patient away due to the copay, I advised that we would not turn away due to the copay.

## 2016-06-08 NOTE — Telephone Encounter (Signed)
Attempted to return the call no answer and no voicemail  There is no way to pay over the phone

## 2016-06-14 LAB — HM DIABETES EYE EXAM

## 2016-07-05 ENCOUNTER — Other Ambulatory Visit: Payer: Self-pay

## 2016-07-05 ENCOUNTER — Telehealth: Payer: Self-pay | Admitting: Internal Medicine

## 2016-07-05 ENCOUNTER — Telehealth: Payer: Self-pay

## 2016-07-05 MED ORDER — GLUCOSE BLOOD VI STRP: ORAL_STRIP | 11 refills | Status: DC

## 2016-07-05 NOTE — Telephone Encounter (Signed)
See note in chart

## 2016-07-05 NOTE — Telephone Encounter (Signed)
Noted  

## 2016-07-05 NOTE — Telephone Encounter (Signed)
Submitted

## 2016-07-05 NOTE — Telephone Encounter (Signed)
**  Remind patient they can make refill requests via MyChart**  Medication refill request (Name & Dosage):   glucose blood (ACCU-CHEK ACTIVE STRIPS) test strip [[id number]][[id number]]     [facility] ([facility] & [name]- [place], [place] - [address] [phone] (Phone) [phone] (Fax)       Other comments (if applicable):  Patient states she is completely out of test strips, states she is testing before and after every meal.

## 2016-07-05 NOTE — Telephone Encounter (Signed)
Patient returned phone call and advised that she was checking 7 times a day based off the log that was given to her, I explained to patient that Dr.Gherghe only wanted her to test 3 times daily. Patient is currently out of strips and the pharmacy would not give her any more for another month. I advsied that we did not have samples for her meter right now, but we could give her another meter and strips that would last her a month but patient is unsure if she wants to do this, and did not want to drive to come pick that up. Patient was unhappy with this, but after attempting to accommodate patient she was still unhappy. I advised patient to call back if she needed us to do anything else.  Patient hung up.

## 2016-07-05 NOTE — Telephone Encounter (Signed)
Called and LVM for patient advising that I could not place the strips for 7 times daily, as the last OV note from Dr.Gherghe states three times daily and for audit and insurance purposes I would be unable to change it without her consent, and that she was out of town until July 9th. Gave call back number for patient to discuss.

## 2016-07-05 NOTE — Telephone Encounter (Signed)
Patient's son called to get clarification on the note below since the patient was not clear on what to do. I read to him the note by Raynelle FanningJulie and he agreed to coming to pick up the meter and the strips tomorrow from the office. Raynelle FanningJulie stated she would have the meter and strips for him to pick up tomorrow.

## 2016-07-05 NOTE — Telephone Encounter (Signed)
Called and LVM for patient advising that I could not place the strips for 7 times daily, as the last OV note from Dr.Gherghe states three times daily and for audit and insurance purposes I would be unable to change it without her consent, and that she was out of town until July 9th. Gave call back number for patient to discuss.   

## 2016-07-05 NOTE — Telephone Encounter (Signed)
Patient states she is testing 7 times per day- before and after every meal, and then before bed. Asked for return phone call to discuss and figure out what to do moving forward.

## 2016-07-05 NOTE — Telephone Encounter (Signed)
Patient returning phone call. Transferred to Julie. °

## 2016-07-06 ENCOUNTER — Other Ambulatory Visit: Payer: Self-pay | Admitting: Primary Care

## 2016-07-06 MED ORDER — GLUCOSE BLOOD VI STRP: ORAL_STRIP | 3 refills | Status: DC

## 2016-07-19 ENCOUNTER — Other Ambulatory Visit: Payer: Self-pay | Admitting: Primary Care

## 2016-07-19 DIAGNOSIS — E785 Hyperlipidemia, unspecified: Secondary | ICD-10-CM

## 2016-07-23 ENCOUNTER — Ambulatory Visit: Payer: Medicare HMO | Admitting: Cardiovascular Disease

## 2016-08-09 ENCOUNTER — Emergency Department
Admission: EM | Admit: 2016-08-09 | Discharge: 2016-08-09 | Disposition: A | Discharge status: Home / Self Care | Payer: Medicare HMO | Attending: Emergency Medicine | Admitting: Emergency Medicine

## 2016-08-09 DIAGNOSIS — I129 Hypertensive chronic kidney disease with stage 1 through stage 4 chronic kidney disease, or unspecified chronic kidney disease: Secondary | ICD-10-CM | POA: Insufficient documentation

## 2016-08-09 DIAGNOSIS — E1122 Type 2 diabetes mellitus with diabetic chronic kidney disease: Secondary | ICD-10-CM | POA: Insufficient documentation

## 2016-08-09 DIAGNOSIS — Z951 Presence of aortocoronary bypass graft: Secondary | ICD-10-CM | POA: Insufficient documentation

## 2016-08-09 DIAGNOSIS — K529 Noninfective gastroenteritis and colitis, unspecified: Secondary | ICD-10-CM | POA: Diagnosis not present

## 2016-08-09 DIAGNOSIS — Z7982 Long term (current) use of aspirin: Secondary | ICD-10-CM | POA: Diagnosis not present

## 2016-08-09 DIAGNOSIS — E860 Dehydration: Secondary | ICD-10-CM

## 2016-08-09 DIAGNOSIS — I259 Chronic ischemic heart disease, unspecified: Secondary | ICD-10-CM | POA: Diagnosis not present

## 2016-08-09 DIAGNOSIS — Z794 Long term (current) use of insulin: Secondary | ICD-10-CM | POA: Diagnosis not present

## 2016-08-09 DIAGNOSIS — R112 Nausea with vomiting, unspecified: Secondary | ICD-10-CM | POA: Diagnosis present

## 2016-08-09 DIAGNOSIS — N189 Chronic kidney disease, unspecified: Secondary | ICD-10-CM | POA: Diagnosis not present

## 2016-08-09 LAB — CBC
HEMATOCRIT: 36.3 % (ref 35.0–47.0)
Hemoglobin: 12.6 g/dL (ref 12.0–16.0)
MCH: 31.1 pg (ref 26.0–34.0)
MCHC: 34.6 g/dL (ref 32.0–36.0)
MCV: 89.8 fL (ref 80.0–100.0)
Platelets: 181 10*3/uL (ref 150–440)
RBC: 4.04 MIL/uL (ref 3.80–5.20)
RDW: 14.1 % (ref 11.5–14.5)
WBC: 5.1 10*3/uL (ref 3.6–11.0)

## 2016-08-09 LAB — URINALYSIS, COMPLETE (UACMP) WITH MICROSCOPIC
Bilirubin Urine: NEGATIVE
HGB URINE DIPSTICK: NEGATIVE
Ketones, ur: 5 mg/dL — AB
LEUKOCYTES UA: NEGATIVE
NITRITE: NEGATIVE
PH: 5 (ref 5.0–8.0)
PROTEIN: NEGATIVE mg/dL
SPECIFIC GRAVITY, URINE: 1.016 (ref 1.005–1.030)

## 2016-08-09 LAB — LIPASE, BLOOD: LIPASE: 22 U/L (ref 11–51)

## 2016-08-09 LAB — COMPREHENSIVE METABOLIC PANEL
ALBUMIN: 4.5 g/dL (ref 3.5–5.0)
ALT: 19 U/L (ref 14–54)
AST: 21 U/L (ref 15–41)
Alkaline Phosphatase: 62 U/L (ref 38–126)
Anion gap: 11 (ref 5–15)
BUN: 42 mg/dL — ABNORMAL HIGH (ref 6–20)
CHLORIDE: 103 mmol/L (ref 101–111)
CO2: 24 mmol/L (ref 22–32)
Calcium: 10.2 mg/dL (ref 8.9–10.3)
Creatinine, Ser: 1.48 mg/dL — ABNORMAL HIGH (ref 0.44–1.00)
GFR calc Af Amer: 37 mL/min — ABNORMAL LOW (ref >60)
GFR, EST NON AFRICAN AMERICAN: 32 mL/min — AB (ref >60)
GLUCOSE: 371 mg/dL — AB (ref 65–99)
Potassium: 4.8 mmol/L (ref 3.5–5.1)
SODIUM: 138 mmol/L (ref 135–145)
TOTAL PROTEIN: 7.9 g/dL (ref 6.5–8.1)
Total Bilirubin: 0.9 mg/dL (ref 0.3–1.2)

## 2016-08-09 LAB — TROPONIN I: Troponin I: <0.03 ng/mL (ref <0.03)

## 2016-08-09 LAB — GLUCOSE, CAPILLARY: Glucose-Capillary: 352 mg/dL — ABNORMAL HIGH (ref 65–99)

## 2016-08-09 MED ORDER — ONDANSETRON HCL 4 MG PO TABS: 4.0000 mg | ORAL_TABLET | Freq: Three times a day (TID) | ORAL | 0 refills | Status: DC | PRN

## 2016-08-09 MED ORDER — ONDANSETRON HCL 4 MG/2ML IJ SOLN
4.0000 mg | Freq: Once | INTRAMUSCULAR | Status: AC
  Administered 2016-08-09: 4 mg via INTRAVENOUS
  Filled 2016-08-09: qty 2

## 2016-08-09 MED ORDER — SODIUM CHLORIDE 0.9 % IV BOLUS (SEPSIS)
1000.0000 mL | Freq: Once | INTRAVENOUS | Status: AC
  Administered 2016-08-09: 1000 mL via INTRAVENOUS

## 2016-08-09 NOTE — ED Notes (Signed)
Pt states N&V and dizziness that began last night. Denies blood in vomit. States hx vertigo. Alert, oriented.

## 2016-08-09 NOTE — ED Notes (Signed)
Pt given crackers and diet coke for PO challenge.

## 2016-08-09 NOTE — ED Provider Notes (Signed)
[facility] Emergency Department Provider Note  ____________________________________________   I have reviewed the triage vital signs and the nursing notes.   HISTORY  Chief Complaint Emesis and Dizziness    HPI Laura Ferguson is a 83 y.o. female who states that she has been having nausea, and vomiting over the last 2-3 days. She ate some chicken which she thought may be the culprit. Also had some loose stools. Denies any focal abdominal pain. Feels slightly lightheaded. Denies a chest pain or shortness of breath. She does note it says her sugars have been slightly elevated.She has a history of chronic renal insufficiency, CAD, remote history of CABG, diabetes. She denies any fever or chills, and she states that she started to feel better but she wants to be "checked out".  She denies any melena bright red blood per rectum or hematemesis.   Past Medical History:  Diagnosis Date  . CAD (coronary artery disease)   . History of nuclear stress test 11/22/2011   bruce myoview; normal pattern of perfusio; post-stress EF 76%; low risk scan  . Hyperlipidemia   . Hypertension   . 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 Type 2 diabetes mellitus [facility])     Patient Active Problem List   Diagnosis Date Noted  . Diabetes mellitus (HCC) 06/08/2016  . Ankle swelling 06/01/2015  . Dizziness 03/26/2015  . CAD (coronary artery disease) 12/29/2012  . HTN (hypertension) 12/29/2012  . Hyperlipidemia with target LDL less than 70 12/29/2012    Past Surgical History:  Procedure Laterality Date  . CARDIAC CATHETERIZATION  05/07/2007   EF 55%, focal mild hypocontractility in mid-distal inferior wall & mid posterolateral wall; severe multivessel CAD - susequent CABGx6 (Dr. Bishop Limbo)  . CORONARY ARTERY BYPASS GRAFT  05/09/2007   LIMA to LAD, veing to intermediate; SVG to OM1 & OM2; SVG to acute  marginal & distal RCA (Dr. Dorris Fetch)  . TRANSTHORACIC ECHOCARDIOGRAM  02/13/2010   EF =>55%, vigorous contraction EF 65%; LA mild-mod dilated; IV normal diameter - normal CVP; trace MR; mild TR; trace AV regurg    Prior to Admission medications   Medication Sig Start Date End Date Taking? Authorizing Provider  aspirin 81 MG tablet Take 81 mg by mouth daily.   Yes [provider]  insulin aspart (NOVOLOG FLEXPEN) 100 UNIT/ML FlexPen Inject 6-10 Units into the skin 3 (three) times daily with meals. Patient taking differently: Inject 6 Units into the skin 3 (three) times daily with meals.  06/08/16  Yes Carlus Pavlov, MD  LEVEMIR FLEXTOUCH 100 UNIT/ML Pen Inject 30 Units into the skin daily. Patient taking differently: Inject 30 Units into the skin daily. 30 units daily in morning 06/08/16  Yes Carlus Pavlov, MD  metoprolol succinate (TOPROL-XL) 50 MG 24 hr tablet TAKE 1 TABLET BY MOUTH ONCE DAILY WITH OR IMMEDIATELY FOLLOWING A MEAL 04/20/16  Yes Lennette Bihari, MD  Multiple Vitamin (MULTIVITAMIN) capsule Take 1 capsule by mouth daily.   Yes [provider]  pantoprazole (PROTONIX) 40 MG tablet TAKE 1 TABLET BY MOUTH DAILY 03/19/16  Yes Lennette Bihari, MD  Polyethyl Glycol-Propyl Glycol (SYSTANE OP) Apply 2 drops to eye 2 (two) times daily.    Yes [provider]  rosuvastatin (CRESTOR) 10 MG tablet TAKE 1 TABLET BY MOUTH ONCE A DAY 07/19/16  Yes Doreene Nest, NP  TRAVATAN Z 0.004 % SOLN ophthalmic solution Place 1 drop into both eyes at bedtime. 10/14/14  Yes [provider]  valsartan-hydrochlorothiazide (DIOVAN-HCT) 80-12.5 MG tablet Take 1 tablet by mouth daily. 01/03/16  Yes Doreene Nestlark, Katherine K, NP  Acetaminophen (TYLENOL 8 HOUR PO) Take 1 tablet by mouth every 8 (eight) hours as needed.     [provider]  glucose blood (ACCU-CHEK ACTIVE STRIPS) test strip Use as instructed to test blood sugar 3 times daily. 07/06/16   Doreene Nestlark, Katherine K,  NP  Insulin Pen Needle (CAREFINE PEN NEEDLES) 32G X 4 MM MISC Use 4x a day 06/08/16   Carlus PavlovGherghe, Cristina, MD  Lancets (ACCU-CHEK SAFE-T PRO) lancets Use as instructed to test blood sugar 3 times daily. 07/27/15   Doreene Nestlark, Katherine K, NP  meclizine (ANTIVERT) 25 MG tablet Take 25 mg by mouth 3 (three) times daily as needed for dizziness.    [provider]    Allergies Patient has no known allergies.  Family History  Problem Relation Age of Onset  . Cancer Mother   . Diabetes Sister   . Cancer Child     Social History Social History  Substance Use Topics  . Smoking status: Never Smoker  . Smokeless tobacco: Never Used     Comment: quit 1960's  smoked very lightly.  . Alcohol use No    Review of Systems Constitutional: No fever/chills Eyes: No visual changes. ENT: No sore throat. No stiff neck no neck pain Cardiovascular: Denies chest pain. Respiratory: Denies shortness of breath. Gastrointestinal:   CHPI Genitourinary: Negative for dysuria. Musculoskeletal: Negative lower extremity swelling Skin: Negative for rash. Neurological: Negative for severe headaches, focal weakness or numbness.   ____________________________________________   PHYSICAL EXAM:  VITAL SIGNS: ED Triage Vitals [08/09/16 1051]  Enc Vitals Group     BP (!) 135/59     Pulse Rate 74     Resp 18     Temp 97.8 F (36.6 C)     Temp Source Oral     SpO2 100 %     Weight 113 lb (51.3 kg)     Height 5\' 4"  (1.626 m)     Head Circumference      Peak Flow      Pain Score 0     Pain Loc      Pain Edu?      Excl. in GC?     Constitutional: Alert and oriented. Well appearing and in no acute distress. Eyes: Conjunctivae are normal Head: Atraumatic HEENT: No congestion/rhinnorhea. Mucous membranes are moist.  Oropharynx non-erythematous Neck:   Nontender with no meningismus, no masses, no stridor Cardiovascular: Normal rate, regular rhythm. Grossly normal heart sounds.  Good peripheral  circulation. Respiratory: Normal respiratory effort.  No retractions. Lungs CTAB. Abdominal: Soft and nontender. No distention. No guarding no rebound Back:  There is no focal tenderness or step off.  there is no midline tenderness there are no lesions noted. there is no CVA tenderness  Musculoskeletal: No lower extremity tenderness, no upper extremity tenderness. No joint effusions, no DVT signs strong distal pulses no edema Neurologic:  Normal speech and language. No gross focal neurologic deficits are appreciated.  Skin:  Skin is warm, dry and intact. No rash noted. Psychiatric: Mood and affect are normal. Speech and behavior are normal.  ____________________________________________   LABS (all labs ordered are listed, but only abnormal results are displayed)  Labs Reviewed  COMPREHENSIVE METABOLIC PANEL - Abnormal; Notable for the following:  Result Value   Glucose, Bld 371 (*)    BUN 42 (*)    Creatinine, Ser 1.48 (*)    GFR calc non Af Amer 32 (*)    GFR calc Af Amer 37 (*)    All other components within normal limits  URINALYSIS, COMPLETE (UACMP) WITH MICROSCOPIC - Abnormal; Notable for the following:    Color, Urine YELLOW (*)    APPearance CLEAR (*)    Glucose, UA >=500 (*)    Ketones, ur 5 (*)    Bacteria, UA RARE (*)    Squamous Epithelial / LPF 0-5 (*)    All other components within normal limits  GLUCOSE, CAPILLARY - Abnormal; Notable for the following:    Glucose-Capillary 352 (*)    All other components within normal limits  LIPASE, BLOOD  CBC  TROPONIN I  CBG MONITORING, ED   ____________________________________________  EKG  I personally interpreted any EKGs ordered by me or triage Sinus rhythm, normal, no acute ST elevation or depression, normal axis unremarkable EKG ____________________________________________  RADIOLOGY  I reviewed any imaging ordered by me or triage that were performed during my shift and, if possible, patient and/or family  made aware of any abnormal findings. ____________________________________________   PROCEDURES  Procedure(s) performed: None  Procedures  Critical Care performed: None  ____________________________________________   INITIAL IMPRESSION / ASSESSMENT AND PLAN / ED COURSE  Pertinent labs & imaging results that were available during my care of the patient were reviewed by me and considered in my medical decision making (see chart for details).  Patient here with nausea vomiting and diarrhea for 3 days off and on, has not actually vomited today. Is tolerating by mouth. Sugars are somewhat elevated. Denies chest pain. Serial abdominal exams are completely without with no evidence of tenderness. IV fluid is being administered after Zofran IV fluid patient has absolutely no symptoms and is eager to go home. We will finish the IV bolus. Blood work is otherwise reassuring vital signs are reassuring EKGs are reassuring, patient will stay with family. We'll send her home with antiemetics. This is most likely either a food related and/or viral-related gastroenteritis with a benign abdominal exam. No evidence of ACS PE dissection or intra-abdominal pathology such as appy or gb dz. patient's blood sugar is elevated but her baseline is around 300 and multiple prior visits.    ____________________________________________   FINAL CLINICAL IMPRESSION(S) / ED DIAGNOSES  Final diagnoses:  None      This chart was dictated using voice recognition software.  Despite best efforts to proofread,  errors can occur which can change meaning.      Jeanmarie PlantMcShane, James A, MD 08/09/16 1550

## 2016-08-09 NOTE — ED Triage Notes (Signed)
Pt reporting NVD and dizziness that began last night. Pt alert and oriented X4, active, cooperative, pt in NAD. RR even and unlabored, color WNL.

## 2016-08-14 ENCOUNTER — Ambulatory Visit: Payer: Medicare HMO | Admitting: Internal Medicine

## 2016-08-14 ENCOUNTER — Telehealth: Payer: Self-pay | Admitting: Internal Medicine

## 2016-08-14 DIAGNOSIS — Z0289 Encounter for other administrative examinations: Secondary | ICD-10-CM

## 2016-08-14 NOTE — Telephone Encounter (Signed)
3mo

## 2016-08-14 NOTE — Telephone Encounter (Signed)
Patient no showed today's appt. Please advise on how to follow up. °A. No follow up necessary. °B. Follow up urgent. Contact patient immediately. °C. Follow up necessary. Contact patient and schedule visit in ___ days. °D. Follow up advised. Contact patient and schedule visit in ____weeks. ° °

## 2016-09-26 ENCOUNTER — Telehealth: Payer: Self-pay

## 2016-09-26 DIAGNOSIS — I10 Essential (primary) hypertension: Secondary | ICD-10-CM

## 2016-09-26 MED ORDER — LOSARTAN POTASSIUM-HCTZ 50-12.5 MG PO TABS: 1.0000 | ORAL_TABLET | Freq: Every day | ORAL | 0 refills | Status: DC

## 2016-09-26 NOTE — Telephone Encounter (Signed)
[facility]; pt had requested refill valsartan HCTZ which is still not available and Laura Ferguson request substitute med such as losartan HCTZ. Please advise.

## 2016-09-26 NOTE — Telephone Encounter (Signed)
Please notify patient and pharmacy that we have switched her medication from valsartan-HCTZ to losartan-HCTZ. Take 1 tablet by mouth once daily. Please have her come in for an office visit for blood pressure recheck in 2 weeks.

## 2016-09-27 NOTE — Telephone Encounter (Signed)
Patient returned Chan's call.  Please call patient back at [phone].

## 2016-09-27 NOTE — Telephone Encounter (Signed)
Message left for patient to return my call.  

## 2016-09-28 NOTE — Telephone Encounter (Signed)
Spoken and notified patient of Kate's comments. Patient verbalized understanding.  Follow up on 10/19/2016

## 2016-10-16 ENCOUNTER — Encounter: Payer: Self-pay | Admitting: Internal Medicine

## 2016-10-16 ENCOUNTER — Ambulatory Visit (INDEPENDENT_AMBULATORY_CARE_PROVIDER_SITE_OTHER): Payer: Medicare HMO | Admitting: Internal Medicine

## 2016-10-16 VITALS — BP 138/74 | HR 71 | Wt 122.0 lb

## 2016-10-16 DIAGNOSIS — E1159 Type 2 diabetes mellitus with other circulatory complications: Secondary | ICD-10-CM | POA: Diagnosis not present

## 2016-10-16 DIAGNOSIS — Z794 Long term (current) use of insulin: Secondary | ICD-10-CM

## 2016-10-16 LAB — POCT GLYCOSYLATED HEMOGLOBIN (HGB A1C): Hemoglobin A1C: 8.8

## 2016-10-16 NOTE — Patient Instructions (Addendum)
Please continue: - Levemir 30 units in am  Please increase: - Novolog: 6 units before a smaller meal 8 units before a larger meal 10 units before a very large meal or if you have dessert  Please return in 3 months with your sugar log.

## 2016-10-16 NOTE — Progress Notes (Signed)
Patient ID: Laura Ferguson, female   DOB: [birth year], 83 y.o.   MRN: [id number]   HPI: Laura Ferguson is a 83 y.o.-year-old female, returning for f/u for DM2, dx in 1990s, insulin-dependent for years, uncontrolled, with complications (CAD - s/p CABG x6, CKD, DR, PN). Last visit 4 mo ago.  She saw the nutritionist since last visit.  She feels much better after she started the Novolog. Sugars are better and She does not have generalized aches and pains anymore.  Last hemoglobin A1c was: Lab Results  Component Value Date   HGBA1C 11.7 (H) 04/02/2016   HGBA1C 10.0 (H) 01/03/2016   HGBA1C 9.5 (H) 09/30/2015   Pt was on a regimen of: - Levemir 20 units in am and 20 units at night (skips if sugars are lower) - Glipizide XL 10 mg daily in am  At last visit, we changed to: - Levemir 30 units in am - Novolog 10-15 minutes before meals: 6 units before a smaller meal  not taking the higher doses  Pt checks her sugars 3-4x a day - however, she only brought logs up to June of this year. More recently: - am: 196, 251, 431 >> 90-200 - 2h after b'fast:  - before lunch: 280, 402 >> ? - 2h after lunch: n/c - before dinner: 151 >> ? - 2h after dinner: 408 >> ? - bedtime: 217, 209 >> ? - nighttime: n/c Lowest sugar was 91 >> 60s; ? hypoglycemia awareness.  Highest sugar was 500s >> 200s.  Glucometer: AccuChek  Pt's meals are: - Breakfast: oatmeal, grits, apple sauce - Lunch: tuna sandwich, bologna - Dinner: soup, sandwich - Snacks: no   - she has CKD, last BUN/creatinine:  Lab Results  Component Value Date   BUN 42 (H) 08/09/2016   BUN 32 (H) 05/04/2016   CREATININE 1.48 (H) 08/09/2016   CREATININE 1.40 (H) 05/04/2016  On Losartan 50. - last set of lipids: Lab Results  Component Value Date   CHOL 140 04/02/2016   HDL 76.80 04/02/2016   LDLCALC 50 04/02/2016   TRIG 64.0 04/02/2016   CHOLHDL 2 04/02/2016  On Crestor 10. Cardiologist: Dr. Tresa Endo.  - last eye exam was in 2017 >> + DR.  New eye exam coming up. - she has numbness and tingling in her feet.  She also has HL, HTN.  ROS: Constitutional: no weight gain/no weight loss, no fatigue, no subjective hyperthermia, no subjective hypothermia Eyes: + blurry vision, no xerophthalmia ENT: no sore throat, no nodules palpated in throat, no dysphagia, no odynophagia, no hoarseness Cardiovascular: no CP/no SOB/no palpitations/no leg swelling Respiratory: no cough/no SOB/no wheezing Gastrointestinal: + N/no V/no D/+ C/no acid reflux Musculoskeletal: no muscle aches/no joint aches Skin: no rashes, no hair loss Neurological: no tremors/+ numbness/+ tingling/no dizziness  I reviewed pt's medications, allergies, PMH, social hx, family hx, and changes were documented in the history of present illness. Otherwise, unchanged from my initial visit note.  Past Medical History:  Diagnosis Date  . CAD (coronary artery disease)   . History of nuclear stress test 11/22/2011   bruce myoview; normal pattern of perfusio; post-stress EF 76%; low risk scan  . Hyperlipidemia   . Hypertension   . 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 Type 2 diabetes mellitus (HCC)    Past Surgical History:  Procedure Laterality Date  . CARDIAC  CATHETERIZATION  05/07/2007   EF 55%, focal mild hypocontractility in mid-distal inferior wall & mid posterolateral wall; severe multivessel CAD - susequent CABGx6 (Dr. Bishop Limbo)  . CORONARY ARTERY BYPASS GRAFT  05/09/2007   LIMA to LAD, veing to intermediate; SVG to OM1 & OM2; SVG to acute marginal & distal RCA (Dr. Dorris Fetch)  . TRANSTHORACIC ECHOCARDIOGRAM  02/13/2010   EF =>55%, vigorous contraction EF 65%; LA mild-mod dilated; IV normal diameter - normal CVP; trace MR; mild TR; trace AV regurg   Social History   Social History  . Marital status: Widowed    Spouse name: N/A  . Number of children: 8   Occupational History   . homemaker   Social History Main Topics  . Smoking status: Never Smoker  . Smokeless tobacco: Never Used     Comment: quit 1960's  smoked very lightly.  . Alcohol use No  . Drug use: No     Social History Narrative   Married.   Retired.   Works as a Engineer, structural.    Enjoys helping other.    Current Outpatient Prescriptions on File Prior to Visit  Medication Sig Dispense Refill  . Acetaminophen (TYLENOL 8 HOUR PO) Take 1 tablet by mouth every 8 (eight) hours as needed.     Marland Kitchen aspirin 81 MG tablet Take 81 mg by mouth daily.    Marland Kitchen glucose blood (ACCU-CHEK ACTIVE STRIPS) test strip Use as instructed to test blood sugar 3 times daily. 300 each 3  . insulin aspart (NOVOLOG FLEXPEN) 100 UNIT/ML FlexPen Inject 6-10 Units into the skin 3 (three) times daily with meals. (Patient taking differently: Inject 6 Units into the skin 3 (three) times daily with meals. ) 15 mL 11  . Insulin Pen Needle (CAREFINE PEN NEEDLES) 32G X 4 MM MISC Use 4x a day 300 each 4  . Lancets (ACCU-CHEK SAFE-T PRO) lancets Use as instructed to test blood sugar 3 times daily. 100 each 11  . LEVEMIR FLEXTOUCH 100 UNIT/ML Pen Inject 30 Units into the skin daily. (Patient taking differently: Inject 30 Units into the skin daily. 30 units daily in morning) 15 mL 3  . losartan-hydrochlorothiazide (HYZAAR) 50-12.5 MG tablet Take 1 tablet by mouth daily. 90 tablet 0  . meclizine (ANTIVERT) 25 MG tablet Take 25 mg by mouth 3 (three) times daily as needed for dizziness.    . metoprolol succinate (TOPROL-XL) 50 MG 24 hr tablet TAKE 1 TABLET BY MOUTH ONCE DAILY WITH OR IMMEDIATELY FOLLOWING A MEAL 30 tablet 6  . Multiple Vitamin (MULTIVITAMIN) capsule Take 1 capsule by mouth daily.    . ondansetron (ZOFRAN) 4 MG tablet Take 1 tablet (4 mg total) by mouth every 8 (eight) hours as needed for nausea or vomiting. 8 tablet 0  . pantoprazole (PROTONIX) 40 MG tablet TAKE 1 TABLET BY MOUTH DAILY 30 tablet 11  . Polyethyl Glycol-Propyl Glycol  (SYSTANE OP) Apply 2 drops to eye 2 (two) times daily.     . rosuvastatin (CRESTOR) 10 MG tablet TAKE 1 TABLET BY MOUTH ONCE A DAY 90 tablet 1  . TRAVATAN Z 0.004 % SOLN ophthalmic solution Place 1 drop into both eyes at bedtime.    . valsartan-hydrochlorothiazide (DIOVAN-HCT) 80-12.5 MG tablet Take 1 tablet by mouth daily. 90 tablet 3   No current facility-administered medications on file prior to visit.    No Known Allergies Family History  Problem Relation Age of Onset  . Cancer Mother   . Diabetes Sister   .  Cancer Child    Pt has FH of DM in [place], sister - died from DM complications (refused to start HD), sons - 1 son died from DM complications.  PE: BP 138/74 (BP Location: Left Arm, Patient Position: Sitting)   Pulse 71   Wt 122 lb (55.3 kg)   SpO2 98%   BMI 20.94 kg/m  Wt Readings from Last 3 Encounters:  10/16/16 122 lb (55.3 kg)  08/09/16 113 lb (51.3 kg)  06/08/16 113 lb (51.3 kg)   Constitutional: thin, in NAD Eyes: PERRLA, EOMI, no exophthalmos ENT: moist mucous membranes, no thyromegaly, no cervical lymphadenopathy Cardiovascular: RRR, No MRG Respiratory: CTA B Gastrointestinal: abdomen soft, NT, ND, BS+ Musculoskeletal: no deformities, strength intact in all 4 Skin: moist, warm, no rashes Neurological: no tremor with outstretched hands, DTR normal in all 4  ASSESSMENT: 1. DM2, insulin-dependent, uncontrolled, with complications - CAD - s/p CABG x6 - CKD - DR - PN  PLAN:  1. Patient with long-standing, uncontrolled diabetes, on oral diabetic regimen in the past, which we switched to a basal-bolus insulin regimen at last visit, with significant improvement in her sugars and her pain level. Unfortunately, she does not bring a log and cannot remember well the sugar levels at home, however, her HbA1c today decreased 3 percentages since last visit, to 8.8% today. This is not quite at goal and I suspect that her sugars are still increasing as the day goes by as she  is only using the lower dose of NovoLog suggested at l I will advise her to increase this by 2 units. - she saw nutritionist since last visit - I suggested to:  Patient Instructions  Please continue: - Levemir 30 units in am  Please increase: - Novolog: 6 units before a smaller meal 8 units before a larger meal 10 units before a very large meal or if you have dessert  Please return in 3 months with your sugar log.    - continue checking sugars at different times of the day - check 3x a day, rotating checks - advised for yearly eye exams >> she is UTD - Refuses flu shot today - Return to clinic in 3 mo with sugar log    Laura Pavlov, MD PhD [facility] Endocrinology

## 2016-10-17 ENCOUNTER — Ambulatory Visit: Payer: Medicare HMO | Admitting: Cardiovascular Disease

## 2016-10-19 ENCOUNTER — Encounter: Payer: Self-pay | Admitting: Primary Care

## 2016-10-19 ENCOUNTER — Ambulatory Visit (INDEPENDENT_AMBULATORY_CARE_PROVIDER_SITE_OTHER): Payer: Medicare HMO | Admitting: Primary Care

## 2016-10-19 DIAGNOSIS — I10 Essential (primary) hypertension: Secondary | ICD-10-CM | POA: Diagnosis not present

## 2016-10-19 NOTE — Patient Instructions (Addendum)
Continue taking losartan-hydrochlorothiazide and metoprolol succinate for high blood pressure.  Schedule your medicare wellness visit/physical in 6 months.  It was a pleasure to see you today!

## 2016-10-19 NOTE — Assessment & Plan Note (Signed)
Stable in the office today since switching from valsartan-HCTZ to losartan-HCTZ. Continue same. Continue metoprolol.

## 2016-10-19 NOTE — Progress Notes (Signed)
Subjective:    Patient ID: Laura Ferguson, female    DOB: 04-Apr-1932, 83 y.o.   MRN: [id number]  HPI  Laura Ferguson is an 83 year old female who presents today for follow up.  1) Type 2 Diabetes: Currently following with endocrinology with her last visit being on 10/16/16. She saw the nutritionist. She's currently managed on Levemir 30 units every morning, Novolog 10-15 units TID with meals. She's compliant to checking her blood sugars several times daily and is getting improved readings. Overall she's feeling much better.  2) Hypertension: Currently managed on Losartan-HCTZ 50/12.5 mg and metoprolol succinate 50 mg. Her valsartan-HCTZ was changed to losartan-HCTZ on 09/26/16 due to recent recall. Her BP in the office today is 112/94. She's checking her blood pressure at home sometimes, her most recent reading was 119/69. She denies chest pain, dizziness, headaches.   Review of Systems  Constitutional: Negative for fatigue.  Respiratory: Negative for shortness of breath.   Cardiovascular: Negative for chest pain.  Neurological: Negative for dizziness, numbness and headaches.       Past Medical History:  Diagnosis Date  . CAD (coronary artery disease)   . History of nuclear stress test 11/22/2011   bruce myoview; normal pattern of perfusio; post-stress EF 76%; low risk scan  . Hyperlipidemia   . Hypertension   . 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 Type 2 diabetes mellitus (HCC)      Social History   Social History  . Marital status: Widowed    Spouse name: N/A  . Number of children: 7  . Years of education: N/A   Occupational History  . Not on file.   Social History Main Topics  . Smoking status: Never Smoker  . Smokeless tobacco: Never Used     Comment: quit 1960's  smoked very lightly.  . Alcohol use No  . Drug use: No  . Sexual activity: Not on file   Other Topics Concern  . Not on  file   Social History Narrative   Married.   Retired.   Works as a Engineer, structural.    Enjoys helping other.     Past Surgical History:  Procedure Laterality Date  . CARDIAC CATHETERIZATION  05/07/2007   EF 55%, focal mild hypocontractility in mid-distal inferior wall & mid posterolateral wall; severe multivessel CAD - susequent CABGx6 (Dr. Bishop Limbo)  . CORONARY ARTERY BYPASS GRAFT  05/09/2007   LIMA to LAD, veing to intermediate; SVG to OM1 & OM2; SVG to acute marginal & distal RCA (Dr. Dorris Fetch)  . TRANSTHORACIC ECHOCARDIOGRAM  02/13/2010   EF =>55%, vigorous contraction EF 65%; LA mild-mod dilated; IV normal diameter - normal CVP; trace MR; mild TR; trace AV regurg    Family History  Problem Relation Age of Onset  . Cancer Mother   . Diabetes Sister   . Cancer Child     No Known Allergies  Current Outpatient Prescriptions on File Prior to Visit  Medication Sig Dispense Refill  . Acetaminophen (TYLENOL 8 HOUR PO) Take 1 tablet by mouth every 8 (eight) hours as needed.     Marland Kitchen aspirin 81 MG tablet Take 81 mg by mouth daily.    Marland Kitchen glucose blood (ACCU-CHEK ACTIVE STRIPS) test strip Use as instructed to test blood sugar 3 times daily. 300 each 3  . insulin aspart (NOVOLOG FLEXPEN) 100 UNIT/ML  FlexPen Inject 6-10 Units into the skin 3 (three) times daily with meals. (Patient taking differently: Inject 6 Units into the skin 3 (three) times daily with meals. ) 15 mL 11  . Insulin Pen Needle (CAREFINE PEN NEEDLES) 32G X 4 MM MISC Use 4x a day 300 each 4  . Lancets (ACCU-CHEK SAFE-T PRO) lancets Use as instructed to test blood sugar 3 times daily. 100 each 11  . LEVEMIR FLEXTOUCH 100 UNIT/ML Pen Inject 30 Units into the skin daily. (Patient taking differently: Inject 30 Units into the skin daily. 30 units daily in morning) 15 mL 3  . losartan-hydrochlorothiazide (HYZAAR) 50-12.5 MG tablet Take 1 tablet by mouth daily. 90 tablet 0  . metoprolol succinate (TOPROL-XL) 50 MG 24 hr tablet TAKE 1  TABLET BY MOUTH ONCE DAILY WITH OR IMMEDIATELY FOLLOWING A MEAL 30 tablet 6  . Multiple Vitamin (MULTIVITAMIN) capsule Take 1 capsule by mouth daily.    . pantoprazole (PROTONIX) 40 MG tablet TAKE 1 TABLET BY MOUTH DAILY 30 tablet 11  . Polyethyl Glycol-Propyl Glycol (SYSTANE OP) Apply 2 drops to eye 2 (two) times daily.     . rosuvastatin (CRESTOR) 10 MG tablet TAKE 1 TABLET BY MOUTH ONCE A DAY 90 tablet 1  . TRAVATAN Z 0.004 % SOLN ophthalmic solution Place 1 drop into both eyes at bedtime.     No current facility-administered medications on file prior to visit.     BP 112/64   Pulse 64   Temp 97.6 F (36.4 C) (Oral)   Wt 121 lb (54.9 kg)   SpO2 99%   BMI 20.77 kg/m    Objective:   Physical Exam  Constitutional: She appears well-nourished.  Neck: Neck supple.  Cardiovascular: Normal rate and regular rhythm.   Pulmonary/Chest: Effort normal and breath sounds normal.  Skin: Skin is warm and dry.          Assessment & Plan:

## 2016-10-23 ENCOUNTER — Encounter: Payer: Self-pay | Admitting: Cardiovascular Disease

## 2016-10-23 ENCOUNTER — Ambulatory Visit (INDEPENDENT_AMBULATORY_CARE_PROVIDER_SITE_OTHER): Payer: Medicare HMO | Admitting: Cardiovascular Disease

## 2016-10-23 VITALS — BP 138/60 | HR 76 | Ht 64.0 in | Wt 122.0 lb

## 2016-10-23 DIAGNOSIS — E1165 Type 2 diabetes mellitus with hyperglycemia: Secondary | ICD-10-CM | POA: Diagnosis not present

## 2016-10-23 DIAGNOSIS — E785 Hyperlipidemia, unspecified: Secondary | ICD-10-CM | POA: Diagnosis not present

## 2016-10-23 DIAGNOSIS — I2510 Atherosclerotic heart disease of native coronary artery without angina pectoris: Secondary | ICD-10-CM | POA: Diagnosis not present

## 2016-10-23 DIAGNOSIS — I10 Essential (primary) hypertension: Secondary | ICD-10-CM | POA: Diagnosis not present

## 2016-10-23 DIAGNOSIS — Z951 Presence of aortocoronary bypass graft: Secondary | ICD-10-CM | POA: Diagnosis not present

## 2016-10-23 NOTE — Patient Instructions (Signed)
Medication Instructions:  Your physician recommends that you continue on your current medications as directed. Please refer to the Current Medication list given to you today.  Follow-Up: Your physician wants you to follow-up in: 12 MONTHS with Dr. Kelly. You will receive a reminder letter in the mail two months in advance. If you don't receive a letter, please call our office to schedule the follow-up appointment.   Any Other Special Instructions Will Be Listed Below (If Applicable).     If you need a refill on your cardiac medications before your next appointment, please call your pharmacy.   
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
## 2016-10-30 LAB — HM DIABETES EYE EXAM

## 2016-11-01 ENCOUNTER — Encounter: Payer: Self-pay | Admitting: Primary Care

## 2016-11-29 ENCOUNTER — Other Ambulatory Visit: Payer: Self-pay

## 2016-11-29 DIAGNOSIS — E119 Type 2 diabetes mellitus without complications: Secondary | ICD-10-CM

## 2016-11-29 DIAGNOSIS — Z794 Long term (current) use of insulin: Principal | ICD-10-CM

## 2016-11-29 MED ORDER — LEVEMIR FLEXTOUCH 100 UNIT/ML ~~LOC~~ SOPN: 30.0000 [IU] | PEN_INJECTOR | Freq: Every day | SUBCUTANEOUS | 3 refills | Status: DC

## 2016-11-30 ENCOUNTER — Other Ambulatory Visit: Payer: Self-pay

## 2016-11-30 ENCOUNTER — Telehealth: Payer: Self-pay

## 2016-11-30 DIAGNOSIS — Z794 Long term (current) use of insulin: Principal | ICD-10-CM

## 2016-11-30 DIAGNOSIS — E119 Type 2 diabetes mellitus without complications: Secondary | ICD-10-CM

## 2016-11-30 MED ORDER — LEVEMIR FLEXTOUCH 100 UNIT/ML ~~LOC~~ SOPN: 30.0000 [IU] | PEN_INJECTOR | Freq: Every day | SUBCUTANEOUS | 3 refills | Status: DC

## 2016-11-30 NOTE — Telephone Encounter (Signed)
Submitted

## 2016-11-30 NOTE — Telephone Encounter (Signed)
Patient calling to request refill on [facility]vemir [facility] to [facility] in [place]

## 2016-12-11 LAB — HM DIABETES EYE EXAM

## 2016-12-13 ENCOUNTER — Encounter: Payer: Self-pay | Admitting: Primary Care

## 2016-12-19 ENCOUNTER — Telehealth: Payer: Self-pay | Admitting: Internal Medicine

## 2016-12-19 MED ORDER — GLUCOSE BLOOD VI STRP: ORAL_STRIP | 3 refills | Status: DC

## 2016-12-19 NOTE — Telephone Encounter (Signed)
Patient was told by pharmacy they have been faxing Dr with no response. [facility] [address] ph# [phone]. Patient no longer uses the old pharmacy-[facility]-no more

## 2016-12-19 NOTE — Telephone Encounter (Signed)
Sent to pharmacy 

## 2016-12-27 ENCOUNTER — Other Ambulatory Visit: Payer: Self-pay | Admitting: Cardiovascular Disease

## 2016-12-27 MED ORDER — METOPROLOL SUCCINATE ER 50 MG PO TB24: 50.0000 mg | ORAL_TABLET | Freq: Every day | ORAL | 6 refills | Status: DC

## 2016-12-27 NOTE — Telephone Encounter (Signed)
°*  STAT* If patient is at the pharmacy, call can be transferred to refill team.   1. Which medications need to be refilled? (please list name of each medication and dose if known) Metoprolol-Please call today,she is completely out   2. Which pharmacy/location (including street and city if local pharmacy) is medication to be sent to?[facility][phone]  3. Do they need a 30 day or 90 day supply?30 and refills

## 2016-12-27 NOTE — Telephone Encounter (Signed)
Rx sent to pharmacy specified below. Pt made aware.

## 2016-12-28 ENCOUNTER — Telehealth: Payer: Self-pay | Admitting: Cardiovascular Disease

## 2017-01-01 ENCOUNTER — Telehealth: Payer: Self-pay | Admitting: Primary Care

## 2017-01-01 DIAGNOSIS — I10 Essential (primary) hypertension: Secondary | ICD-10-CM

## 2017-01-01 NOTE — Telephone Encounter (Signed)
Copied from CRM [id number]. Topic: Quick Communication - Rx Refill/Question >> Jan 01, 2017  2:00 PM Terisa Starraylor, Brittany L wrote: Has the patient contacted their pharmacy? Yes, [facility] (they told her to call her PCP)  (Agent: If no, request that the patient contact the pharmacy for the refill.)   Preferred Pharmacy (with phone number or street name): [facility] [address]  Agent: Please be advised that RX refills may take up to 3 business days. We ask that you follow-up with your pharmacy.

## 2017-01-02 ENCOUNTER — Telehealth: Payer: Self-pay | Admitting: Primary Care

## 2017-01-02 DIAGNOSIS — I10 Essential (primary) hypertension: Secondary | ICD-10-CM

## 2017-01-02 MED ORDER — LOSARTAN POTASSIUM-HCTZ 50-12.5 MG PO TABS: 1.0000 | ORAL_TABLET | Freq: Every day | ORAL | 0 refills | Status: DC

## 2017-01-02 NOTE — Telephone Encounter (Signed)
Copied from CRM #[id number]. Topic: Quick Communication - Rx Refill/Question >> Jan 02, 2017  1:12 PM Landry MellowFoltz, Melissa J wrote: Has the patient contacted their pharmacy? Yes.     (Agent: If no, request that the patient contact the pharmacy for the refill.)   Preferred Pharmacy (with phone number or street name): n/a Pt went to pick up losartan-hydrochlorothiazide (HYZAAR) 50-12.5 MG tablet and the pharmacy is out. It will be in stock in 2 days.  Pharmacy told pt that everyone is out of this medication. Pt is calling to find out what to do next, she is not sure what to do next. Cb # [phone]. [facility] [address]. Pt is out of medication.    Agent: Please be advised that RX refills may take up to 3 business days. We ask that you follow-up with your pharmacy.

## 2017-01-02 NOTE — Telephone Encounter (Signed)
Refill request for Losartan-Hydrochlorothiazide / LOV 10/19/16 with [name] / Refill per protocol / losartan-hydrochlorothiazide (HYZAAR) 50-12.5 MG tablet 90 tablet 0 01/02/2017    Sig - Route: Take 1 tablet by mouth daily. - Oral   Sent to pharmacy as: losartan-hydrochlorothiazide (HYZAAR) 50-12.5 MG tablet   E-Prescribing Status: Receipt confirmed by pharmacy (01/02/2017 9:38 AM EST)

## 2017-01-02 NOTE — Telephone Encounter (Signed)
Message left for patient to return my call.  

## 2017-01-02 NOTE — Telephone Encounter (Signed)
We can send in separate prescriptions for each medication: one prescription for losartan 50 mg and one prescription for HCTZ 12.5 mg. What's her BP running?

## 2017-01-03 MED ORDER — HYDROCHLOROTHIAZIDE 12.5 MG PO CAPS: 12.5000 mg | ORAL_CAPSULE | Freq: Every day | ORAL | 0 refills | Status: DC

## 2017-01-03 MED ORDER — LOSARTAN POTASSIUM 50 MG PO TABS: 50.0000 mg | ORAL_TABLET | Freq: Every day | ORAL | 0 refills | Status: DC

## 2017-01-03 NOTE — Telephone Encounter (Signed)
Spoken and notified patient of Kate's comments. I had to make sure patient is a ware of her taking 2 tablet instead of 1 tablet until Hyzaar is in stock.  I have also received fax request from [facility] asking about this since medication is in backorder.  Patient verbalized understanding.

## 2017-01-03 NOTE — Telephone Encounter (Signed)
Noted.  Sent prescription for losartan 50 mg and hydrochlorothiazide 12.5 mg for a 30-day supply.  She will resume Hyzaar once these prescriptions are complete.

## 2017-01-07 ENCOUNTER — Telehealth: Payer: Self-pay | Admitting: Primary Care

## 2017-01-07 NOTE — Telephone Encounter (Signed)
Copied from CRM [id number]. Topic: Quick Communication - See Telephone Encounter >> Jan 07, 2017  9:34 AM Diana EvesHoyt, Maryann B wrote: CRM for notification. See Telephone encounter for:  Pt was prescribed hydrochlorothiazide. Pt doesn't like the side effects of this med and would like to have something else called in. [facility] was [facility] of the [facility]  01/07/17.

## 2017-01-07 NOTE — Telephone Encounter (Signed)
I spoke with pt and she has not taken the HCTZ because she does not want potential side effects that med can offer such as sluggishness and feeling tired. Pt said BP 114/60; does pt need BP med? Pt request cb. Pt also wants to use [facility] [address] if med needs to be sent in.

## 2017-01-07 NOTE — Telephone Encounter (Signed)
Please get patient in the office so we can discuss, also make sure she brings ALL of her medication bottles. She was taking Hyzaar which contains HCTZ and losartan. We did not change her medications, we only filled two separate medications as her Hyzaar was on back order.

## 2017-01-07 NOTE — Telephone Encounter (Signed)
Copied from CRM [phone]. Topic: Quick Communication - See Telephone Encounter >> Jan 07, 2017  9:34 AM Diana EvesHoyt, Maryann B wrote: CRM for notification. See Telephone encounter for:  Pt was prescribed hydrochlorothiazide. Pt doesn't like the side effects of this med and would like to have something else called in. [facility] was [facility] of the [facility]artan  01/07/17.  >> Jan 07, 2017  9:40 AM Elliot GaultBell, Tiffany M wrote:  Relation to pt: self Call back number: [phone] [facility] [address], [place], [place] [address] [phone]   Reason for call:  Patient and previous agent were disconnected and patient states she research symtomps for hydrochlorothiazide (MICROZIDE) 12.5 MG capsule and would like another medication prescribe.  Patient would like losartan (COZAAR) 50 MG [facility] [address], [place], [place] [address] [phone] due to common used pharmacy not having medication in stock. Please advise

## 2017-01-09 NOTE — Telephone Encounter (Signed)
Spoken and notified patient of Kate's comments. Patient verbalized understanding.  I have schedule patient's an appt on 01/11/2017

## 2017-01-11 ENCOUNTER — Ambulatory Visit (INDEPENDENT_AMBULATORY_CARE_PROVIDER_SITE_OTHER): Payer: Medicare HMO | Admitting: Primary Care

## 2017-01-11 DIAGNOSIS — I10 Essential (primary) hypertension: Secondary | ICD-10-CM

## 2017-01-11 NOTE — Assessment & Plan Note (Signed)
BP stable on Toprol XL 50 mg.  Discussed that Hyzaar is the same as taking Losartan 50 gm and HCTZ 12.5 mg, she verbalized understanding. She will continue to monitor her BP and report readings at or above 140/90. Continue to hold Losartan 50 mg and HCTZ 12.5 mg. Urine microalbumin in 2018 without evdience of renal damage.

## 2017-01-11 NOTE — Progress Notes (Signed)
Subjective:    Patient ID: Laura Ferguson, female    DOB: 1932/08/12, 83 y.o.   MRN: [id number]  HPI  Laura Ferguson is a 83 year old female who presents today for follow up of hypertension.  She called into our office in mid December 2018 with reports that her Hyzaar was out of stock and wouldn't become available for several days. She was then contacted and provided with separate scripts for Losartan 50 mg and HCTZ 12.5 mg. She then called in reporting reticence to taking HCTZ due to reading all of the potential side effects.  She's checking her blood pressure at home and is getting readings of 110's-130's/60's-70's. She's only taking metoprolol succinate 50 mg. No use of losartan 50 mg or HCTZ 12.5 mg in 3 weeks. She denies chest pain, headaches, dizziness.   BP Readings from Last 3 Encounters:  01/11/17 136/62  10/23/16 138/60  10/19/16 112/64     Review of Systems  Respiratory: Negative for shortness of breath.   Cardiovascular: Negative for chest pain.  Neurological: Negative for dizziness and headaches.       Past Medical History:  Diagnosis Date  . CAD (coronary artery disease)   . History of nuclear stress test 11/22/2011   bruce myoview; normal pattern of perfusio; post-stress EF 76%; low risk scan  . Hyperlipidemia   . Hypertension   . 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 Type 2 diabetes mellitus (HCC)      Social History   Socioeconomic History  . Marital status: Widowed    Spouse name: Not on file  . Number of children: 7  . Years of education: Not on file  . Highest education level: Not on file  Social Needs  . Financial resource strain: Not on file  . Food insecurity - worry: Not on file  . Food insecurity - inability: Not on file  . Transportation needs - medical: Not on file  . Transportation needs - non-medical: Not on file  Occupational History  . Not on file  Tobacco  Use  . Smoking status: Never Smoker  . Smokeless tobacco: Never Used  . Tobacco comment: quit 1960's  smoked very lightly.  Substance and Sexual Activity  . Alcohol use: No    Alcohol/week: 0.0 oz  . Drug use: No  . Sexual activity: Not on file  Other Topics Concern  . Not on file  Social History Narrative   Married.   Retired.   Works as a Engineer, structuralCaregiver.    Enjoys helping other.     Past Surgical History:  Procedure Laterality Date  . CARDIAC CATHETERIZATION  05/07/2007   EF 55%, focal mild hypocontractility in mid-distal inferior wall & mid posterolateral wall; severe multivessel CAD - susequent CABGx6 (Dr. Bishop Limbo. [place])  . CORONARY ARTERY BYPASS GRAFT  05/09/2007   LIMA to LAD, veing to intermediate; SVG to OM1 & OM2; SVG to acute marginal & distal RCA (Dr. Dorris FetchHendrickson)  . TRANSTHORACIC ECHOCARDIOGRAM  02/13/2010   EF =>55%, vigorous contraction EF 65%; LA mild-mod dilated; IV normal diameter - normal CVP; trace MR; mild TR; trace AV regurg    Family History  Problem Relation Age of Onset  . Cancer Mother   . Diabetes Sister   . Cancer Child     No Known Allergies  Current Outpatient Medications on File Prior to Visit  Medication Sig Dispense Refill  . Acetaminophen (TYLENOL 8 HOUR PO) Take 1 tablet by mouth every 8 (eight) hours as needed.     Marland Kitchen. aspirin 81 MG tablet Take 81 mg by mouth daily.    Marland Kitchen. glucose blood (ACCU-CHEK ACTIVE STRIPS) test strip Use as instructed to test blood sugar 3 times daily. 300 each 3  . insulin aspart (NOVOLOG FLEXPEN) 100 UNIT/ML FlexPen Inject 6-10 Units into the skin 3 (three) times daily with meals. (Patient taking differently: Inject 6 Units into the skin 3 (three) times daily with meals. ) 15 mL 11  . Insulin Pen Needle (CAREFINE PEN NEEDLES) 32G X 4 MM MISC Use 4x a day 300 each 4  . Lancets (ACCU-CHEK SAFE-T PRO) lancets Use as instructed to test blood sugar 3 times daily. 100 each 11  . LEVEMIR FLEXTOUCH 100 UNIT/ML Pen Inject 30 Units daily  into the skin. 15 mL 3  . losartan (COZAAR) 50 MG tablet Take 1 tablet (50 mg total) by mouth daily. 30 tablet 0  . losartan-hydrochlorothiazide (HYZAAR) 50-12.5 MG tablet Take 1 tablet by mouth daily. 90 tablet 0  . metoprolol succinate (TOPROL-XL) 50 MG 24 hr tablet Take 1 tablet (50 mg total) by mouth daily. Take with or immediately following a meal. 30 tablet 6  . Multiple Vitamin (MULTIVITAMIN) capsule Take 1 capsule by mouth daily.    . pantoprazole (PROTONIX) 40 MG tablet TAKE 1 TABLET BY MOUTH DAILY 30 tablet 11  . Polyethyl Glycol-Propyl Glycol (SYSTANE OP) Apply 2 drops to eye 2 (two) times daily.     . rosuvastatin (CRESTOR) 10 MG tablet TAKE 1 TABLET BY MOUTH ONCE A DAY 90 tablet 1  . TRAVATAN Z 0.004 % SOLN ophthalmic solution Place 1 drop into both eyes at bedtime.    . hydrochlorothiazide (MICROZIDE) 12.5 MG capsule Take 1 capsule (12.5 mg total) by mouth daily. (Patient not taking: Reported on 01/11/2017) 30 capsule 0   No current facility-administered medications on file prior to visit.     BP 136/62 (BP Location: Right Arm, Patient Position: Sitting, Cuff Size: Normal)   Pulse 63   Temp 97.8 F (36.6 C) (Oral)   Wt 123 lb (55.8 kg)   SpO2 98%   BMI 21.11 kg/m    Objective:   Physical Exam  Constitutional: She appears well-nourished.  Neck: Neck supple.  Cardiovascular: Normal rate and regular rhythm.  Pulmonary/Chest: Effort normal and breath sounds normal.  Skin: Skin is warm and dry.          Assessment & Plan:

## 2017-01-11 NOTE — Patient Instructions (Signed)
Continue to monitor your blood pressure and report readings at or above 140/90.  The losartan 50 mg and HCTZ 12.5 mg are the same medications that are in the Hyzaar 50/12.5 mg medication.   Please call me if your blood pressure starts to increase as discussed.  Follow up in 3 months for blood pressure check.  It was a pleasure to see you today!

## 2017-01-16 ENCOUNTER — Telehealth: Payer: Self-pay | Admitting: Primary Care

## 2017-01-16 DIAGNOSIS — E785 Hyperlipidemia, unspecified: Secondary | ICD-10-CM

## 2017-01-16 MED ORDER — ROSUVASTATIN CALCIUM 10 MG PO TABS: 10.0000 mg | ORAL_TABLET | Freq: Every day | ORAL | 1 refills | Status: DC

## 2017-01-16 NOTE — Telephone Encounter (Signed)
Pt. Called to report BP "has been pretty good, except I ate a little ham yesterday. BP today 145/71." Going to "diabetic doctor tomorrow" and see what it is tomorrow.

## 2017-01-16 NOTE — Telephone Encounter (Signed)
Copied from CRM [id number]. Topic: Quick Communication - See Telephone Encounter >> Jan 16, 2017  3:14 PM Trula SladeWalter, Linda F wrote: CRM for notification. See Telephone encounter for:  01/16/17. Patient would like her Rosudastatis medication refilled and sent to her new preferred pharmacist, the [facility] on [address].

## 2017-01-17 ENCOUNTER — Encounter: Payer: Self-pay | Admitting: Internal Medicine

## 2017-01-17 ENCOUNTER — Ambulatory Visit (INDEPENDENT_AMBULATORY_CARE_PROVIDER_SITE_OTHER): Payer: Medicare HMO | Admitting: Internal Medicine

## 2017-01-17 VITALS — BP 152/78 | HR 68 | Ht 64.0 in | Wt 124.4 lb

## 2017-01-17 DIAGNOSIS — Z794 Long term (current) use of insulin: Secondary | ICD-10-CM

## 2017-01-17 DIAGNOSIS — E1159 Type 2 diabetes mellitus with other circulatory complications: Secondary | ICD-10-CM | POA: Diagnosis not present

## 2017-01-17 DIAGNOSIS — E785 Hyperlipidemia, unspecified: Secondary | ICD-10-CM | POA: Diagnosis not present

## 2017-01-17 LAB — POCT GLYCOSYLATED HEMOGLOBIN (HGB A1C): Hemoglobin A1C: 9.1

## 2017-01-17 NOTE — Patient Instructions (Addendum)
Please continue: - Levemir 30 units in a.m. - Novolog: 6 units before a smaller meal 8 units before a larger meal 10 units before a very large meal or if you have dessert  If sugars increase as the day goes by, try to increase Humalog.  Please return in 1.5 months with your sugar log.

## 2017-01-17 NOTE — Addendum Note (Signed)
Addended by: Yolande JollyLAWSON, Ayeisha Lindenberger on: 01/17/2017 01:25 PM   Modules accepted: Orders

## 2017-01-17 NOTE — Progress Notes (Signed)
Patient ID: Laura Ferguson, female   DOB: [birth year], 84 y.o.   MRN: [id number]   HPI: Laura Ferguson is a 84 y.o.-year-old female, returning for f/u for DM2, dx in 1990s, insulin-dependent for years, uncontrolled, with complications (CAD - s/p CABG x6, CKD, DR, PN). Last visit 3 months ago.  She is under a tremendous amt of stress: buried her youngest son (cancer). He was 53.  Last hemoglobin A1c was: 10/16/2016: HbA1c 8.8% Lab Results  Component Value Date   HGBA1C 11.7 (H) 04/02/2016   HGBA1C 10.0 (H) 01/03/2016   HGBA1C 9.5 (H) 09/30/2015   She is now on: - Levemir 82 units in a.m. - Novolog: 6 units before a smaller meal 8 units before a larger meal 10 units before a very large meal or if you have dessert  Pt is not checking sugars. From last visit.  - am: 196, 251, 431 >> 90-200 - 2h after b'fast:  - before lunch: 280, 402 >> ? - 2h after lunch: n/c - before dinner: 151 >> ? - 2h after dinner: 408 >> ? - bedtime: 217, 209 >> ? - nighttime: n/c Lowest sugar was 91 >> 60s >> ?; unclear at what level she has hypoglycemia awareness.  Highest sugar was 500s >> 200s >> ?.  Glucometer: AccuChek  Pt's meals are: - Breakfast: oatmeal, grits, apple sauce - Lunch: tuna sandwich, bologna - Dinner: soup, sandwich - Snacks: no   -+ CKD, last BUN/creatinine:  Lab Results  Component Value Date   BUN 42 (H) 08/09/2016   BUN 32 (H) 05/04/2016   CREATININE 1.48 (H) 08/09/2016   CREATININE 1.40 (H) 05/04/2016  On losartan 50. -HL; last set of lipids: Lab Results  Component Value Date   CHOL 140 04/02/2016   HDL 76.80 04/02/2016   LDLCALC 50 04/02/2016   TRIG 64.0 04/02/2016   CHOLHDL 2 04/02/2016  On Crestor 10.  Cardiologist: Dr. Tresa Endo.  - last eye exam was in 10/2016: + DR.  - + numbness and tingling in her feet.  She also has HTN.  ROS: Constitutional: no weight gain/no weight loss, no fatigue, no subjective hyperthermia, + subjective hypothermia Eyes: + blurry vision,  no xerophthalmia ENT: no sore throat, no nodules palpated in throat, no dysphagia, no odynophagia, no hoarseness Cardiovascular: no CP/no SOB/no palpitations/no leg swelling Respiratory: no cough/no SOB/no wheezing Gastrointestinal: no N/no V/no D/+ C/+ acid reflux Musculoskeletal: + muscle aches/+ joint aches Skin: + rash, no hair loss Neurological: no tremors/+ numbness/+ tingling/no dizziness  I reviewed pt's medications, allergies, PMH, social hx, family hx, and changes were documented in the history of present illness. Otherwise, unchanged from my initial visit note.   Past Medical History:  Diagnosis Date  . CAD (coronary artery disease)   . History of nuclear stress test 11/22/2011   bruce myoview; normal pattern of perfusio; post-stress EF 76%; low risk scan  . Hyperlipidemia   . Hypertension   . 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 Type 2 diabetes mellitus (HCC)    Past Surgical History:  Procedure Laterality Date  . CARDIAC CATHETERIZATION  05/07/2007   EF 55%, focal mild hypocontractility in mid-distal inferior wall & mid posterolateral wall; severe multivessel CAD - susequent CABGx6 (Dr. Bishop Limbo)  . CORONARY ARTERY BYPASS GRAFT  05/09/2007   LIMA to LAD, veing to intermediate; SVG to OM1 &  OM2; SVG to acute marginal & distal RCA (Dr. Dorris Fetch)  . TRANSTHORACIC ECHOCARDIOGRAM  02/13/2010   EF =>55%, vigorous contraction EF 65%; LA mild-mod dilated; IV normal diameter - normal CVP; trace MR; mild TR; trace AV regurg   Social History   Social History  . Marital status: Widowed    Spouse name: N/A  . Number of children: 8   Occupational History  . homemaker   Social History Main Topics  . Smoking status: Never Smoker  . Smokeless tobacco: Never Used     Comment: quit 1960's  smoked very lightly.  . Alcohol use No  . Drug use: No     Social History Narrative   Married.    Retired.   Works as a Engineer, structural.    Enjoys helping other.    Current Outpatient Medications on File Prior to Visit  Medication Sig Dispense Refill  . Acetaminophen (TYLENOL 8 HOUR PO) Take 1 tablet by mouth every 8 (eight) hours as needed.     Marland Kitchen aspirin 81 MG tablet Take 81 mg by mouth daily.    Marland Kitchen glucose blood (ACCU-CHEK ACTIVE STRIPS) test strip Use as instructed to test blood sugar 3 times daily. 300 each 3  . hydrochlorothiazide (MICROZIDE) 12.5 MG capsule Take 1 capsule (12.5 mg total) by mouth daily. (Patient not taking: Reported on 01/11/2017) 30 capsule 0  . insulin aspart (NOVOLOG FLEXPEN) 100 UNIT/ML FlexPen Inject 6-10 Units into the skin 3 (three) times daily with meals. (Patient taking differently: Inject 6 Units into the skin 3 (three) times daily with meals. ) 15 mL 11  . Insulin Pen Needle (CAREFINE PEN NEEDLES) 32G X 4 MM MISC Use 4x a day 300 each 4  . Lancets (ACCU-CHEK SAFE-T PRO) lancets Use as instructed to test blood sugar 3 times daily. 100 each 11  . LEVEMIR FLEXTOUCH 100 UNIT/ML Pen Inject 30 Units daily into the skin. 15 mL 3  . losartan (COZAAR) 50 MG tablet Take 1 tablet (50 mg total) by mouth daily. 30 tablet 0  . losartan-hydrochlorothiazide (HYZAAR) 50-12.5 MG tablet Take 1 tablet by mouth daily. 90 tablet 0  . metoprolol succinate (TOPROL-XL) 50 MG 24 hr tablet Take 1 tablet (50 mg total) by mouth daily. Take with or immediately following a meal. 30 tablet 6  . Multiple Vitamin (MULTIVITAMIN) capsule Take 1 capsule by mouth daily.    . pantoprazole (PROTONIX) 40 MG tablet TAKE 1 TABLET BY MOUTH DAILY 30 tablet 11  . Polyethyl Glycol-Propyl Glycol (SYSTANE OP) Apply 2 drops to eye 2 (two) times daily.     . rosuvastatin (CRESTOR) 10 MG tablet Take 1 tablet (10 mg total) by mouth daily. 90 tablet 1  . TRAVATAN Z 0.004 % SOLN ophthalmic solution Place 1 drop into both eyes at bedtime.     No current facility-administered medications on file prior to visit.    No  Known Allergies Family History  Problem Relation Age of Onset  . Cancer Mother   . Diabetes Sister   . Cancer Child    Pt has FH of DM in [place], sister - died from DM complications (refused to start HD), sons - 1 son died from DM complications.  PE: BP (!) 152/78   Pulse 68   Ht 5\' 4"  (1.626 m)   Wt 124 lb 6.4 oz (56.4 kg)   SpO2 98%   BMI 21.35 kg/m  Wt Readings from Last 3 Encounters:  01/17/17 124 lb 6.4 oz (56.4 kg)  01/11/17 123 lb (55.8 kg)  10/23/16 122 lb (55.3 kg)   Constitutional:  Normal weight, in NAD Eyes: PERRLA, EOMI, no exophthalmos ENT: moist mucous membranes, no thyromegaly, no cervical lymphadenopathy Cardiovascular: RRR, No MRG Respiratory: CTA B Gastrointestinal: abdomen soft, NT, ND, BS+ Musculoskeletal: no deformities, strength intact in all 4 Skin: moist, warm, no rashes Neurological: no tremor with outstretched hands, DTR normal in all 4  ASSESSMENT: 1. DM2, insulin-dependent, uncontrolled, with complications - CAD - s/p CABG x6 - CKD - DR - PN  2. HL  PLAN:  1. Patient with long-standing, uncontrolled, type 2 diabetes, on bolus insulin regimen, with significant improvement in blood sugars after she started this.  Unfortunately, we could not make a significant amount of changes in her insulin regimen as she did not have sugar checks at home.  We did increase her NovoLog with a larger meal to 8 units. - st this visit, she is still not checking sugars 2/2 stress and grief - we still cannot make changes in her regimen, but I advised her to increase her Novolog if she sees a staircase effect of increased sugars as the day foes by - I suggested to:  Patient Instructions  Please continue: - Levemir 30 units in a.m. - Novolog: 6 units before a smaller meal 8 units before a larger meal 10 units before a very large meal or if you have dessert  If sugars increase as the day goes by, try to increase Humalog.  Please return in 1.5 months with your  sugar log.    - today, HbA1c is 9.1% (worse) - continue checking sugars at different times of the day - check 1x a day, rotating checks - advised for yearly eye exams >> she is UTD  - refused a flu shot - Return to clinic in 1.5 mo with sugar log   2. HL -Reviewed latest lipid panel (03/2016) together: Lipid fractions at goal -On Crestor by cardiology.  No side effects.  Carlus Pavlovristina Ashtan Girtman, MD PhD [facility]

## 2017-02-15 ENCOUNTER — Other Ambulatory Visit: Payer: Self-pay | Admitting: Otolaryngology

## 2017-02-15 DIAGNOSIS — R42 Dizziness and giddiness: Secondary | ICD-10-CM

## 2017-02-20 ENCOUNTER — Ambulatory Visit: Admission: RE | Admit: 2017-02-20 | Payer: Medicare HMO | Source: Ambulatory Visit

## 2017-03-04 ENCOUNTER — Ambulatory Visit
Admission: RE | Admit: 2017-03-04 | Discharge: 2017-03-04 | Disposition: A | Discharge status: Home / Self Care | Payer: Medicare HMO | Source: Ambulatory Visit | Attending: Otolaryngology | Admitting: Otolaryngology

## 2017-03-04 DIAGNOSIS — R42 Dizziness and giddiness: Secondary | ICD-10-CM | POA: Insufficient documentation

## 2017-03-04 DIAGNOSIS — I6523 Occlusion and stenosis of bilateral carotid arteries: Secondary | ICD-10-CM | POA: Insufficient documentation

## 2017-03-27 ENCOUNTER — Other Ambulatory Visit: Payer: Self-pay | Admitting: Cardiovascular Disease

## 2017-04-17 ENCOUNTER — Other Ambulatory Visit: Payer: Self-pay | Admitting: Primary Care

## 2017-04-17 DIAGNOSIS — I10 Essential (primary) hypertension: Secondary | ICD-10-CM

## 2017-04-18 ENCOUNTER — Ambulatory Visit (INDEPENDENT_AMBULATORY_CARE_PROVIDER_SITE_OTHER): Payer: Medicare HMO | Admitting: Internal Medicine

## 2017-04-18 ENCOUNTER — Encounter: Payer: Self-pay | Admitting: Internal Medicine

## 2017-04-18 VITALS — BP 144/82 | HR 72 | Ht 64.0 in | Wt 123.4 lb

## 2017-04-18 DIAGNOSIS — E1159 Type 2 diabetes mellitus with other circulatory complications: Secondary | ICD-10-CM

## 2017-04-18 DIAGNOSIS — E785 Hyperlipidemia, unspecified: Secondary | ICD-10-CM

## 2017-04-18 DIAGNOSIS — Z794 Long term (current) use of insulin: Secondary | ICD-10-CM | POA: Diagnosis not present

## 2017-04-18 LAB — POCT GLYCOSYLATED HEMOGLOBIN (HGB A1C): HEMOGLOBIN A1C: 8.5

## 2017-04-18 NOTE — Patient Instructions (Addendum)
START CHECKING SUGARS 3X A DAY.   Please continue: - Levemir 30 units in a.m. - Novolog: 6 units before a smaller meal 8 units before a larger meal 10 units before a very large meal or if you have dessert  If sugars increase as the day goes by, try to increase your Humalog doses.  Please return in 3 months with your sugar log.

## 2017-04-18 NOTE — Progress Notes (Signed)
Patient ID: Laura Ferguson, female   DOB: 1932/05/18, 84 y.o.   MRN: [id number]   HPI: Laura SansBetty C Trapani is a 84 y.o.-year-old female, returning for f/u for DM2, dx in 1990s, insulin-dependent for years, uncontrolled, with complications (CAD - s/p CABG x6, CKD, DR, PN). Last visit 3 months ago.  Last hemoglobin A1c was: Lab Results  Component Value Date   HGBA1C 9.1 01/17/2017   HGBA1C 8.8 10/16/2016   HGBA1C 11.7 (H) 04/02/2016  10/16/2016: HbA1c 8.8%  She is now on: - Levemir 30 units in a.m. - Novolog: 6 units before a smaller meal 8 units before a larger meal 10 units before a very large meal or if you have dessert  She was not checking sugars at last visit - checked 2 days since last visit - am: 196, 251, 431 >> 90-200 >> 181, 266 - 2h after b'fast:  - before lunch: 280, 402 >> 84 - 2h after lunch: n/c - before dinner: 151 >> 186 - 2h after dinner: 408 >> ? - bedtime: 217, 209 >> ? - nighttime: n/c Lowest sugar was 91 >> 60s >> 181;  It is unclear at which level she has hypoglycemia awareness. Highest sugar was 500s >> 200s >> 266.  Glucometer: AccuChek  Pt's meals are: - Breakfast: oatmeal, grits, apple sauce - Lunch: tuna sandwich, bologna - Dinner: soup, sandwich - Snacks: no   -+CKD, last BUN/creatinine:  Lab Results  Component Value Date   BUN 42 (H) 08/09/2016   BUN 32 (H) 05/04/2016   CREATININE 1.48 (H) 08/09/2016   CREATININE 1.40 (H) 05/04/2016  On l losartan 50. -+ HL; last set of lipids: Lab Results  Component Value Date   CHOL 140 04/02/2016   HDL 76.80 04/02/2016   LDLCALC 50 04/02/2016   TRIG 64.0 04/02/2016   CHOLHDL 2 04/02/2016  On Crestor 10.  Cardiologist: Dr. Tresa EndoKelly.  - last eye exam was in 10/2016: + DR. On intraocular injections. Dr. Tomie ChinaZane. - + numbness and tingling in her feet.  She also has hypertension.  ROS: Constitutional: no weight gain/no weight loss, no fatigue, no subjective hyperthermia, no subjective hypothermia Eyes: no  blurry vision, no xerophthalmia ENT: no sore throat, no nodules palpated in throat, no dysphagia, no odynophagia, no hoarseness Cardiovascular: no CP/no SOB/no palpitations/no leg swelling Respiratory: no cough/no SOB/no wheezing Gastrointestinal: no N/no V/no D/no C/no acid reflux Musculoskeletal: no muscle aches/no joint aches Skin: no rashes, no hair loss Neurological: no tremors/+ numbness/+ tingling/no dizziness  I reviewed pt's medications, allergies, PMH, social hx, family hx, and changes were documented in the history of present illness. Otherwise, unchanged from my initial visit note.   Past Medical History:  Diagnosis Date  . CAD (coronary artery disease)   . History of nuclear stress test 11/22/2011   bruce myoview; normal pattern of perfusio; post-stress EF 76%; low risk scan  . Hyperlipidemia   . Hypertension   . 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 Type 2 diabetes mellitus (HCC)    Past Surgical History:  Procedure Laterality Date  . CARDIAC CATHETERIZATION  05/07/2007   EF 55%, focal mild hypocontractility in mid-distal inferior wall & mid posterolateral wall; severe multivessel CAD - susequent CABGx6 (Dr. Bishop Limbo. Kelly)  . CORONARY ARTERY BYPASS GRAFT  05/09/2007   LIMA to LAD, veing to intermediate; SVG to OM1 & OM2; SVG  to acute marginal & distal RCA (Dr. Dorris Fetch)  . TRANSTHORACIC ECHOCARDIOGRAM  02/13/2010   EF =>55%, vigorous contraction EF 65%; LA mild-mod dilated; IV normal diameter - normal CVP; trace MR; mild TR; trace AV regurg   Social History   Social History  . Marital status: Widowed    Spouse name: N/A  . Number of children: 8   Occupational History  . homemaker   Social History Main Topics  . Smoking status: Never Smoker  . Smokeless tobacco: Never Used     Comment: quit 1960's  smoked very lightly.  . Alcohol use No  . Drug use: No     Social History  Narrative   Married.   Retired.   Works as a Engineer, structural.    Enjoys helping other.    Current Outpatient Medications on File Prior to Visit  Medication Sig Dispense Refill  . Acetaminophen (TYLENOL 8 HOUR PO) Take 1 tablet by mouth every 8 (eight) hours as needed.     Marland Kitchen aspirin 81 MG tablet Take 81 mg by mouth daily.    Marland Kitchen glucose blood (ACCU-CHEK ACTIVE STRIPS) test strip Use as instructed to test blood sugar 3 times daily. 300 each 3  . hydrochlorothiazide (MICROZIDE) 12.5 MG capsule Take 1 capsule (12.5 mg total) by mouth daily. 30 capsule 0  . insulin aspart (NOVOLOG FLEXPEN) 100 UNIT/ML FlexPen Inject 6-10 Units into the skin 3 (three) times daily with meals. (Patient taking differently: Inject 6 Units into the skin 3 (three) times daily with meals. ) 15 mL 11  . Insulin Pen Needle (CAREFINE PEN NEEDLES) 32G X 4 MM MISC Use 4x a day 300 each 4  . Lancets (ACCU-CHEK SAFE-T PRO) lancets Use as instructed to test blood sugar 3 times daily. 100 each 11  . LEVEMIR FLEXTOUCH 100 UNIT/ML Pen Inject 30 Units daily into the skin. 15 mL 3  . losartan (COZAAR) 50 MG tablet TAKE 1 TABLET BY MOUTH ONCE DAILY 90 tablet 1  . metoprolol succinate (TOPROL-XL) 50 MG 24 hr tablet Take 1 tablet (50 mg total) by mouth daily. Take with or immediately following a meal. 30 tablet 6  . Multiple Vitamin (MULTIVITAMIN) capsule Take 1 capsule by mouth daily.    . pantoprazole (PROTONIX) 40 MG tablet TAKE 1 TABLET BY MOUTH ONCE DAILY 30 tablet 5  . Polyethyl Glycol-Propyl Glycol (SYSTANE OP) Apply 2 drops to eye 2 (two) times daily.     . rosuvastatin (CRESTOR) 10 MG tablet Take 1 tablet (10 mg total) by mouth daily. 90 tablet 1  . TRAVATAN Z 0.004 % SOLN ophthalmic solution Place 1 drop into both eyes at bedtime.     No current facility-administered medications on file prior to visit.    No Known Allergies Family History  Problem Relation Age of Onset  . Cancer Mother   . Diabetes Sister   . Cancer Child     Pt has FH of DM in [place], sister - died from DM complications (refused to start HD), sons - 1 son died from DM complications.  She was under a tremendous amt of stress at her visit in 01/2017: buried her youngest son (cancer). He was 53.  PE: BP (!) 144/82   Pulse 72   Ht 5\' 4"  (1.626 m)   Wt 123 lb 6.4 oz (56 kg)   SpO2 99%   BMI 21.18 kg/m  Wt Readings from Last 3 Encounters:  04/18/17 123 lb 6.4 oz (56 kg)  01/17/17 124  lb 6.4 oz (56.4 kg)  01/11/17 123 lb (55.8 kg)   Constitutional: Normal weight, in NAD Eyes: PERRLA, EOMI, no exophthalmos ENT: moist mucous membranes, no thyromegaly, no cervical lymphadenopathy Cardiovascular: RRR, No MRG Respiratory: CTA B Gastrointestinal: abdomen soft, NT, ND, BS+ Musculoskeletal: no deformities, strength intact in all 4 Skin: moist, warm, no rashes Neurological: no tremor with outstretched hands, DTR normal in all 4  ASSESSMENT: 1. DM2, insulin-dependent, uncontrolled, with complications - CAD - s/p CABG x6 - CKD - DR - PN  2. HL  PLAN:  1. Patient with long-standing, uncontrolled, type 2 diabetes, on basal-bolus insulin regimen, with worse sugars at last visit after the death of her son.  She was not checking sugars at that time due to stress and grief.  I did not change her regimen, but suggested that she increased her mealtime insulin if the sugars were still high as the day went by, after she started to check - At this visit, unfortunately, she is still not checking sugars.  She only has 5 sugars checked since last visit.  I again strongly advised her to start checking, as otherwise I cannot make any changes in her regimen. - I suggested to:  Patient Instructions  Please continue: - Levemir 30 units in a.m. - Novolog: 6 units before a smaller meal 8 units before a larger meal 10 units before a very large meal or if you have dessert  If sugars increase as the day goes by, try to increase your Humalog doses.  Please return  in 3 months with your sugar log.    - today, HbA1c is 8.5% (better) - continue checking sugars at different times of the day - check 3x a day, rotating checks - advised for yearly eye exams >> she is UTD - Return to clinic in 3 mo with sugar log   2. HL -Reviewed lipid panel from 03/2016: LDL at goal -On Crestor by cardiology.  No side effects. - She is due for another lipid panel, but she mentions that she would like to have this done at her next visit with PCP  Carlus Pavlov, MD PhD [facility] Endocrinology

## 2017-04-19 ENCOUNTER — Telehealth: Payer: Self-pay | Admitting: Internal Medicine

## 2017-04-19 NOTE — Telephone Encounter (Signed)
Patient stated she has a medical question and she would like a call back,. Patient did not state what the question was but would like a call back,  Please advise

## 2017-04-22 ENCOUNTER — Ambulatory Visit (INDEPENDENT_AMBULATORY_CARE_PROVIDER_SITE_OTHER): Payer: Medicare HMO | Admitting: Primary Care

## 2017-04-22 ENCOUNTER — Encounter: Payer: Self-pay | Admitting: Primary Care

## 2017-04-22 VITALS — BP 146/76 | HR 70 | Temp 98.0°F | Ht 64.0 in | Wt 127.2 lb

## 2017-04-22 DIAGNOSIS — Z794 Long term (current) use of insulin: Secondary | ICD-10-CM

## 2017-04-22 DIAGNOSIS — E785 Hyperlipidemia, unspecified: Secondary | ICD-10-CM | POA: Diagnosis not present

## 2017-04-22 DIAGNOSIS — I10 Essential (primary) hypertension: Secondary | ICD-10-CM

## 2017-04-22 DIAGNOSIS — E1159 Type 2 diabetes mellitus with other circulatory complications: Secondary | ICD-10-CM | POA: Diagnosis not present

## 2017-04-22 LAB — COMPREHENSIVE METABOLIC PANEL
ALBUMIN: 3.9 g/dL (ref 3.5–5.2)
ALK PHOS: 66 U/L (ref 39–117)
ALT: 18 U/L (ref 0–35)
AST: 22 U/L (ref 0–37)
BUN: 21 mg/dL (ref 6–23)
CHLORIDE: 105 meq/L (ref 96–112)
CO2: 27 mEq/L (ref 19–32)
Calcium: 9.3 mg/dL (ref 8.4–10.5)
Creatinine, Ser: 1.25 mg/dL — ABNORMAL HIGH (ref 0.40–1.20)
GFR: 52.66 mL/min — AB (ref >60.00)
Glucose, Bld: 190 mg/dL — ABNORMAL HIGH (ref 70–99)
Potassium: 4.1 mEq/L (ref 3.5–5.1)
SODIUM: 139 meq/L (ref 135–145)
TOTAL PROTEIN: 7.2 g/dL (ref 6.0–8.3)
Total Bilirubin: 0.5 mg/dL (ref 0.2–1.2)

## 2017-04-22 LAB — LIPID PANEL
CHOLESTEROL: 134 mg/dL (ref 0–200)
HDL: 73.3 mg/dL (ref >39.00)
LDL CALC: 49 mg/dL (ref 0–99)
NonHDL: 60.83
TRIGLYCERIDES: 57 mg/dL (ref 0.0–149.0)
Total CHOL/HDL Ratio: 2
VLDL: 11.4 mg/dL (ref 0.0–40.0)

## 2017-04-22 NOTE — Progress Notes (Signed)
Subjective:    Patient ID: Laura Ferguson, female    DOB: 11/14/1932, 84 y.o.   MRN: [id number]  HPI  Laura Ferguson is an 84 year old female who presents today for follow up.  1) Type 2 Diabetes: Currently following with endocrinology and is managed on Levemir 30 units daily, Novolog 6-13 units TID with meals. Her last A1C was 8.5 last week. She is not checking her glucose levels as recommended.  2) Essential Hypertension: Currently managed on metoprolol succinate 50 mg, losartan 50 mg, hydrochlorothiazide 12.5 mg. Last visit her losartan and HCTZ were held due to low blood pressure readings at home. She was previously on Hyzaar which was out of stock. She's checking her BP at home and is getting readings of 110's-120's/60's.   BP Readings from Last 3 Encounters:  04/22/17 (!) 146/76  04/18/17 (!) 144/82  01/17/17 (!) 152/78   3) Hyperlipidemia: Currently managed on rosuvastatin 10 mg. She is due for repeat lipid panel today. She denies chest pain, shortness of breath, myalgias.   Review of Systems  Respiratory: Negative for shortness of breath.   Cardiovascular: Negative for chest pain.  Musculoskeletal: Negative for myalgias.  Neurological: Negative for headaches.       Past Medical History:  Diagnosis Date  . CAD (coronary artery disease)   . History of nuclear stress test 11/22/2011   bruce myoview; normal pattern of perfusio; post-stress EF 76%; low risk scan  . Hyperlipidemia   . Hypertension   . 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 Type 2 diabetes mellitus (HCC)      Social History   Socioeconomic History  . Marital status: Widowed    Spouse name: Not on file  . Number of children: 7  . Years of education: Not on file  . Highest education level: Not on file  Occupational History  . Not on file  Social Needs  . Financial resource strain: Not on file  . Food insecurity:    Worry:  Not on file    Inability: Not on file  . Transportation needs:    Medical: Not on file    Non-medical: Not on file  Tobacco Use  . Smoking status: Never Smoker  . Smokeless tobacco: Never Used  . Tobacco comment: quit 1960's  smoked very lightly.  Substance and Sexual Activity  . Alcohol use: No    Alcohol/week: 0.0 oz  . Drug use: No  . Sexual activity: Not on file  Lifestyle  . Physical activity:    Days per week: Not on file    Minutes per session: Not on file  . Stress: Not on file  Relationships  . Social connections:    Talks on phone: Not on file    Gets together: Not on file    Attends religious service: Not on file    Active member of club or organization: Not on file    Attends meetings of clubs or organizations: Not on file    Relationship status: Not on file  . Intimate partner violence:    Fear of current or ex partner: Not on file    Emotionally abused: Not on file    Physically abused: Not on file    Forced sexual activity: Not on file  Other Topics Concern  . Not on file  Social History Narrative   Married.  Retired.   Works as a Engineer, structuralCaregiver.    Enjoys helping other.     Past Surgical History:  Procedure Laterality Date  . CARDIAC CATHETERIZATION  05/07/2007   EF 55%, focal mild hypocontractility in mid-distal inferior wall & mid posterolateral wall; severe multivessel CAD - susequent CABGx6 (Dr. Bishop Limbo. Kelly)  . CORONARY ARTERY BYPASS GRAFT  05/09/2007   LIMA to LAD, veing to intermediate; SVG to OM1 & OM2; SVG to acute marginal & distal RCA (Dr. Dorris FetchHendrickson)  . TRANSTHORACIC ECHOCARDIOGRAM  02/13/2010   EF =>55%, vigorous contraction EF 65%; LA mild-mod dilated; IV normal diameter - normal CVP; trace MR; mild TR; trace AV regurg    Family History  Problem Relation Age of Onset  . Cancer Mother   . Diabetes Sister   . Cancer Child     No Known Allergies  Current Outpatient Medications on File Prior to Visit  Medication Sig Dispense Refill  .  Acetaminophen (TYLENOL 8 HOUR PO) Take 1 tablet by mouth every 8 (eight) hours as needed.     Marland Kitchen. aspirin 81 MG tablet Take 81 mg by mouth daily.    Marland Kitchen. glucose blood (ACCU-CHEK ACTIVE STRIPS) test strip Use as instructed to test blood sugar 3 times daily. 300 each 3  . insulin aspart (NOVOLOG FLEXPEN) 100 UNIT/ML FlexPen Inject 6-10 Units into the skin 3 (three) times daily with meals. (Patient taking differently: Inject 6 Units into the skin 3 (three) times daily with meals. ) 15 mL 11  . Insulin Pen Needle (CAREFINE PEN NEEDLES) 32G X 4 MM MISC Use 4x a day 300 each 4  . Lancets (ACCU-CHEK SAFE-T PRO) lancets Use as instructed to test blood sugar 3 times daily. 100 each 11  . LEVEMIR FLEXTOUCH 100 UNIT/ML Pen Inject 30 Units daily into the skin. 15 mL 3  . losartan (COZAAR) 50 MG tablet TAKE 1 TABLET BY MOUTH ONCE DAILY 90 tablet 1  . metoprolol succinate (TOPROL-XL) 50 MG 24 hr tablet Take 1 tablet (50 mg total) by mouth daily. Take with or immediately following a meal. 30 tablet 6  . Multiple Vitamin (MULTIVITAMIN) capsule Take 1 capsule by mouth daily.    . pantoprazole (PROTONIX) 40 MG tablet TAKE 1 TABLET BY MOUTH ONCE DAILY 30 tablet 5  . Polyethyl Glycol-Propyl Glycol (SYSTANE OP) Apply 2 drops to eye 2 (two) times daily.     . rosuvastatin (CRESTOR) 10 MG tablet Take 1 tablet (10 mg total) by mouth daily. 90 tablet 1  . TRAVATAN Z 0.004 % SOLN ophthalmic solution Place 1 drop into both eyes at bedtime.    . hydrochlorothiazide (MICROZIDE) 12.5 MG capsule Take 1 capsule (12.5 mg total) by mouth daily. (Patient not taking: Reported on 04/22/2017) 30 capsule 0   No current facility-administered medications on file prior to visit.     BP (!) 146/76   Pulse 70   Temp 98 F (36.7 C) (Oral)   Ht 5\' 4"  (1.626 m)   Wt 127 lb 4 oz (57.7 kg)   SpO2 98%   BMI 21.84 kg/m    Objective:   Physical Exam  Constitutional: She appears well-nourished.  Neck: Neck supple.  Cardiovascular: Normal  rate and regular rhythm.  Pulmonary/Chest: Effort normal and breath sounds normal.  Skin: Skin is warm and dry.  Psychiatric: She has a normal mood and affect.          Assessment & Plan:

## 2017-04-22 NOTE — Patient Instructions (Addendum)
Stop by the lab prior to leaving today. I will notify you of your results once received.   Continue taking metoprolol succinate 50 mg for blood pressure. Resume losartan 50 mg once daily for blood pressure. Do not take the hydrochlorothiazide 12.5 mg capsules for now.  Continue to monitor your blood pressure and notify me if you get readings at or below 100/60 or at or above 140/90.  Continue taking rosuvastatin 10 mg tablets for cholesterol.  Try Claritin (loratadine) tablets for throat drainage, mucous in your throat.   Continue to follow up with Dr. Lafe GarinGherge and Dr. Tresa EndoKelly as directed.  Please schedule a follow up appointment in 6 months.   It was a pleasure to see you today!

## 2017-04-22 NOTE — Assessment & Plan Note (Signed)
Resume Losartan 50 mg for renal protection and to reduce BP readings. She is a poor historian. Will have her monitor BP and notify of low readings. Continue metoprolol succinate 50 mg, hold HCTZ 12.5 mg. BMP pending.

## 2017-04-22 NOTE — Assessment & Plan Note (Signed)
Repeat lipids pending.  Continue rosuvastatin. 

## 2017-04-22 NOTE — Assessment & Plan Note (Signed)
Significant improvement in A1C since following with endocrinology. Discussed importance of checking glucose levels. Continue Levemir and Novolog.

## 2017-04-25 NOTE — Telephone Encounter (Signed)
Called pt and she stated her PCP answered her medical questions about cholesterol.

## 2017-04-26 ENCOUNTER — Telehealth: Payer: Self-pay | Admitting: Primary Care

## 2017-04-26 NOTE — Telephone Encounter (Signed)
I did not see her for respiratory symptoms, nor did I prescribe her anything for this. Did she go to an urgent care?  I'm happy to see her if she has URI symptoms, please schedule if needed.

## 2017-04-26 NOTE — Telephone Encounter (Signed)
Copied from CRM [phone]. Topic: Quick Communication - See Telephone Encounter >> Apr 26, 2017  3:28 PM Diana EvesHoyt, Maryann B wrote: CRM for notification. See Telephone encounter for: 04/26/17.  Pt has been taking OTC robetussin and is hoping something can be called in for a sinus infection.

## 2017-04-26 NOTE — Telephone Encounter (Signed)
The prescription Robitussin is not helping  (it was not over the counter)

## 2017-04-29 NOTE — Telephone Encounter (Signed)
Please notify patient that I'd really like to see her for evaluation to rule out pneumonia/other bacterial causes. Cough medication alone won't treat those conditions. I'll squeeze her in anytime at her convenience.

## 2017-04-29 NOTE — Telephone Encounter (Signed)
Pt states she did see Laura Ferguson on 4/08 and was told what she had was allergies. Pt advised to take Clairin.  But states she has gotten worse since then. (pt continuing to cough constantly as we talked)  Pt states she would like the doctor to call her in a cough med.  Pt's previous md prescribed the cough med and that is all that seems to help.  Pt declined to make another appt right now unless she has to.  [facility] [address] - [place], [place] - [id number] [phone] (Phone) [phone] (Fax)

## 2017-04-30 NOTE — Telephone Encounter (Signed)
Message left for patient to return my call. I have 10:45 am on hold for patient. Need to know if patient can come in.

## 2017-04-30 NOTE — Telephone Encounter (Signed)
Noted. FYI to Jae DireKate.

## 2017-04-30 NOTE — Telephone Encounter (Signed)
Noted  

## 2017-04-30 NOTE — Telephone Encounter (Signed)
Patient called back and states that she is feeling a whole lot better. Confirmed that she did not want the 10:45 appointment. States she has been taking some medication and drinking juice. States that she has not been coughing anymore.

## 2017-05-13 ENCOUNTER — Encounter: Payer: Self-pay | Admitting: Primary Care

## 2017-05-13 ENCOUNTER — Ambulatory Visit (INDEPENDENT_AMBULATORY_CARE_PROVIDER_SITE_OTHER): Payer: Medicare HMO | Admitting: Primary Care

## 2017-05-13 DIAGNOSIS — E785 Hyperlipidemia, unspecified: Secondary | ICD-10-CM

## 2017-05-13 DIAGNOSIS — I10 Essential (primary) hypertension: Secondary | ICD-10-CM

## 2017-05-13 NOTE — Patient Instructions (Signed)
Continue taking your Losartan 50 mg and your Metoprolol 50 mg daily as prescribed.   Continue to monitor your blood pressure and report readings at or above 140/90.  It was a pleasure to see you today!

## 2017-05-13 NOTE — Assessment & Plan Note (Signed)
Carotid ultrasounds bilaterally with mild atherosclerotic disease <50%. Continue statin and aspirin.

## 2017-05-13 NOTE — Progress Notes (Signed)
Subjective:    Patient ID: Laura Ferguson, female    DOB: 03-28-1932, 84 y.o.   MRN: [id number]  HPI  Laura Ferguson is a 84 year old female who presents today for follow up of hypertension.  She was last evaluated in April 2019 with evidence of numerous blood pressure readings. Her Hyzaar had been held given reports of low blood pressure home readings. At that visit she was taking Metoprolol Succinate 50 mg only so her Losartan was resumed at 50 mg.   BP Readings from Last 3 Encounters:  05/13/17 136/68  04/22/17 (!) 146/76  04/18/17 (!) 144/82    Since her last visit she's feeling well. She's checking her BP at home which is running 120's/60's. She denies chest pain, dizziness, shortness of breath. She was evaluated by ENT 1-2 weeks ago for vertigo. They ordered bilateral carotid ultrasounds which showed mild atherosclerotic disease <50%, bilaterally.   Review of Systems  HENT: Positive for postnasal drip.   Eyes: Negative for visual disturbance.  Respiratory: Negative for shortness of breath.   Cardiovascular: Negative for chest pain.  Allergic/Immunologic: Positive for environmental allergies.  Neurological: Negative for dizziness and headaches.       Past Medical History:  Diagnosis Date  . CAD (coronary artery disease)   . History of nuclear stress test 11/22/2011   bruce myoview; normal pattern of perfusio; post-stress EF 76%; low risk scan  . Hyperlipidemia   . Hypertension   . 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 Type 2 diabetes mellitus (HCC)      Social History   Socioeconomic History  . Marital status: Widowed    Spouse name: Not on file  . Number of children: 7  . Years of education: Not on file  . Highest education level: Not on file  Occupational History  . Not on file  Social Needs  . Financial resource strain: Not on file  . Food insecurity:    Worry: Not on file   Inability: Not on file  . Transportation needs:    Medical: Not on file    Non-medical: Not on file  Tobacco Use  . Smoking status: Never Smoker  . Smokeless tobacco: Never Used  . Tobacco comment: quit 1960's  smoked very lightly.  Substance and Sexual Activity  . Alcohol use: No    Alcohol/week: 0.0 oz  . Drug use: No  . Sexual activity: Not on file  Lifestyle  . Physical activity:    Days per week: Not on file    Minutes per session: Not on file  . Stress: Not on file  Relationships  . Social connections:    Talks on phone: Not on file    Gets together: Not on file    Attends religious service: Not on file    Active member of club or organization: Not on file    Attends meetings of clubs or organizations: Not on file    Relationship status: Not on file  . Intimate partner violence:    Fear of current or ex partner: Not on file    Emotionally abused: Not on file    Physically abused: Not on file    Forced sexual activity: Not on file  Other Topics Concern  . Not on file  Social History Narrative   Married.   Retired.   Works as a  Caregiver.    Enjoys helping other.     Past Surgical History:  Procedure Laterality Date  . CARDIAC CATHETERIZATION  05/07/2007   EF 55%, focal mild hypocontractility in mid-distal inferior wall & mid posterolateral wall; severe multivessel CAD - susequent CABGx6 (Dr. Bishop Limbo)  . CORONARY ARTERY BYPASS GRAFT  05/09/2007   LIMA to LAD, veing to intermediate; SVG to OM1 & OM2; SVG to acute marginal & distal RCA (Dr. Dorris Fetch)  . TRANSTHORACIC ECHOCARDIOGRAM  02/13/2010   EF =>55%, vigorous contraction EF 65%; LA mild-mod dilated; IV normal diameter - normal CVP; trace MR; mild TR; trace AV regurg    Family History  Problem Relation Age of Onset  . Cancer Mother   . Diabetes Sister   . Cancer Child     No Known Allergies  Current Outpatient Medications on File Prior to Visit  Medication Sig Dispense Refill  . Acetaminophen  (TYLENOL 8 HOUR PO) Take 1 tablet by mouth every 8 (eight) hours as needed.     Marland Kitchen aspirin 81 MG tablet Take 81 mg by mouth daily.    Marland Kitchen glucose blood (ACCU-CHEK ACTIVE STRIPS) test strip Use as instructed to test blood sugar 3 times daily. 300 each 3  . insulin aspart (NOVOLOG FLEXPEN) 100 UNIT/ML FlexPen Inject 6-10 Units into the skin 3 (three) times daily with meals. (Patient taking differently: Inject 6 Units into the skin 3 (three) times daily with meals. ) 15 mL 11  . Insulin Pen Needle (CAREFINE PEN NEEDLES) 32G X 4 MM MISC Use 4x a day 300 each 4  . Lancets (ACCU-CHEK SAFE-T PRO) lancets Use as instructed to test blood sugar 3 times daily. 100 each 11  . LEVEMIR FLEXTOUCH 100 UNIT/ML Pen Inject 30 Units daily into the skin. 15 mL 3  . losartan (COZAAR) 50 MG tablet TAKE 1 TABLET BY MOUTH ONCE DAILY 90 tablet 1  . metoprolol succinate (TOPROL-XL) 50 MG 24 hr tablet Take 1 tablet (50 mg total) by mouth daily. Take with or immediately following a meal. 30 tablet 6  . Multiple Vitamin (MULTIVITAMIN) capsule Take 1 capsule by mouth daily.    . pantoprazole (PROTONIX) 40 MG tablet TAKE 1 TABLET BY MOUTH ONCE DAILY 30 tablet 5  . Polyethyl Glycol-Propyl Glycol (SYSTANE OP) Apply 2 drops to eye 2 (two) times daily.     . rosuvastatin (CRESTOR) 10 MG tablet Take 1 tablet (10 mg total) by mouth daily. 90 tablet 1  . TRAVATAN Z 0.004 % SOLN ophthalmic solution Place 1 drop into both eyes at bedtime.     No current facility-administered medications on file prior to visit.     BP 136/68   Pulse 74   Temp 97.9 F (36.6 C) (Oral)   Ht  (1.626 m)   Wt 124 lb (56.2 kg)   SpO2 97%   BMI 21.28 kg/m    Objective:   Physical Exam  Constitutional: She appears well-nourished.  HENT:  Mouth/Throat: Oropharynx is clear and moist.  Neck: Neck supple.  Cardiovascular: Normal rate and regular rhythm.  Pulmonary/Chest: Effort normal and breath sounds normal.  Skin: Skin is warm and dry.           Assessment & Plan:

## 2017-05-13 NOTE — Assessment & Plan Note (Signed)
Improved with addition of Losartan. Continue Losartan and metoprolol daily. Will have her continue to monitor blood pressure readings and report readings at or above 140/90.

## 2017-05-16 ENCOUNTER — Ambulatory Visit: Payer: Self-pay | Admitting: *Deleted

## 2017-05-16 NOTE — Telephone Encounter (Signed)
Pt  Reports she  Was advised on last visit to notify if BP if  140/90  Pt reports BP 158/81   1 hour  Ago    Latest reading 173/79 at 1350   Pt took of levi mer 30  Units and  8 units of novalog at 0900 bs was  251 - Pt reports she did not eat enough breakfast     BS Dropped to 72  -  She then ate some more and is now 166 and she feel better    Patient has been feeling depressed lately due to loss of son in OCT    Spoke  With patients son Onalee Hua  Who says the patient is her normal self

## 2017-05-16 NOTE — Telephone Encounter (Signed)
Have her continue current medications for now.. If BP remain elevated  > 140/90 have her make appt with Jae Dire tommorow or early next week.

## 2017-05-16 NOTE — Telephone Encounter (Signed)
Left message for Ms. Toner to continue current medications for now.. If BP remains elevated  > 140/90 she needs to make an appt with Jae Dire tommorow or early next week per Dr. Ermalene Searing.

## 2017-05-16 NOTE — Telephone Encounter (Signed)
I spoke with pt and she is feeling better since BS has gone back up; pt has been taking Losartan 50 mg one daily and metoprolol 50 mg 24 hr tab one daily. Pt has not missed any doses. No CP, dizziness or SOB; pt has slight H/A but pt said could be coming from sinus. Pt last seen 05/13/17. Allayne Gitelman NP out of office this afternoon. Please advise.

## 2017-05-20 ENCOUNTER — Telehealth: Payer: Self-pay

## 2017-05-20 NOTE — Telephone Encounter (Signed)
Noted, will call family as scheduled on 05/21/17

## 2017-05-20 NOTE — Telephone Encounter (Signed)
Copied from CRM [id number]. Topic: General - Other >> May 20, 2017  9:29 AM Leafy Ro wrote: Reason for CRM:  05-21-17 Vernona Rieger will call pt son Molly Maduro [phone] if he is unavailable then please call Janeann Merl [phone]. Please do not let patient know her family has called

## 2017-05-21 ENCOUNTER — Other Ambulatory Visit: Payer: Self-pay | Admitting: Primary Care

## 2017-05-21 DIAGNOSIS — Z794 Long term (current) use of insulin: Secondary | ICD-10-CM

## 2017-05-21 DIAGNOSIS — E0800 Diabetes mellitus due to underlying condition with hyperosmolarity without nonketotic hyperglycemic-hyperosmolar coma (NKHHC): Secondary | ICD-10-CM

## 2017-05-21 MED ORDER — ACCU-CHEK SAFE-T PRO LANCETS MISC: 3 refills | Status: DC

## 2017-05-24 ENCOUNTER — Telehealth: Payer: Self-pay | Admitting: Primary Care

## 2017-05-24 NOTE — Telephone Encounter (Signed)
Spoke with patient's son regarding concerns. He reports the patient has signs of depression and anxiety for which she's not disclosed to [place]. Symptoms include irritability, tearfulness, feeling down. They are also concerned about memory and would like testing. They are also concerned about her decreased ability to hear, discussed potential referral to Audiology if patient agrees. Son will try to get patient back in for depression/anxiety screening and memory testing.

## 2017-05-28 ENCOUNTER — Telehealth: Payer: Self-pay | Admitting: Primary Care

## 2017-05-28 MED ORDER — ACCU-CHEK SOFT TOUCH LANCETS MISC: 5 refills | Status: DC

## 2017-05-28 NOTE — Telephone Encounter (Signed)
Send the Rx for the lancets as requested.

## 2017-05-28 NOTE — Telephone Encounter (Signed)
Copied from CRM #[id number]. Topic: Quick Communication - See Telephone Encounter >> May 28, 2017  9:40 AM Lorrine Kin, NT wrote: CRM for notification. See Telephone encounter for: 05/28/17. Jazmine at [employer] and states that the Lancets (ACCU-CHEK SAFE-T PRO) are not available at this time. Is requesting Accu-Chek Soft click lancet. Those work with the patient's machine. States she needs a new prescription to fill this since the patient is on medicare. Please advise. CB#: [phone]

## 2017-06-19 ENCOUNTER — Encounter: Payer: Self-pay | Admitting: Internal Medicine

## 2017-06-19 ENCOUNTER — Ambulatory Visit (INDEPENDENT_AMBULATORY_CARE_PROVIDER_SITE_OTHER): Payer: Medicare HMO | Admitting: Internal Medicine

## 2017-06-19 VITALS — BP 124/82 | HR 68 | Ht 64.0 in | Wt 124.8 lb

## 2017-06-19 DIAGNOSIS — Z794 Long term (current) use of insulin: Secondary | ICD-10-CM | POA: Diagnosis not present

## 2017-06-19 DIAGNOSIS — E785 Hyperlipidemia, unspecified: Secondary | ICD-10-CM

## 2017-06-19 DIAGNOSIS — E1159 Type 2 diabetes mellitus with other circulatory complications: Secondary | ICD-10-CM

## 2017-06-19 NOTE — Progress Notes (Signed)
Patient ID: Laura Ferguson, female   DOB: 1932-09-19, 84 y.o.   MRN: [id number]   HPI: Laura Ferguson is a 84 y.o.-year-old female, returning for f/u for DM2, dx in 1990s, insulin-dependent for years, uncontrolled, with complications (CAD - s/p CABG x6, CKD, DR, PN). Last visit 2 months ago.  She drinks Ensure with meals - now one a day, but prev. 2x a day.  Last hemoglobin A1c was: Lab Results  Component Value Date   HGBA1C 8.5 04/18/2017   HGBA1C 9.1 01/17/2017   HGBA1C 8.8 10/16/2016  10/16/2016: HbA1c 8.8%  She is now on: - Levemir 30 units in a.m. - Novolog: 6 units before a smaller meal 8 units before a larger meal   She checks sugars 3x a day: - am: 196, 251, 431 >> 90-200 >> 181, 266 >> 77-283 - 2h after b'fast:  - before lunch: 280, 402 >> 84 >> 86-211, 333 - 2h after lunch: n/c - before dinner: 151 >> 186 >> 68-198, 257 - 2h after dinner: 408 >> 110, 140 - bedtime: 217, 209 >> ? - nighttime: n/c Lowest sugar was 181 >> 68; it is unclear at which level she has hypoglycemia awareness Highest sugar was 266 >> 333  Glucometer: AccuChek  Pt's meals are: - Breakfast: oatmeal, grits, apple sauce - Lunch: tuna sandwich, bologna - Dinner: soup, sandwich - Snacks: no   - +CKD, last BUN/creatinine:  Lab Results  Component Value Date   BUN 21 04/22/2017   BUN 42 (H) 08/09/2016   CREATININE 1.25 (H) 04/22/2017   CREATININE 1.48 (H) 08/09/2016  On losartan 50. -+ HL; last set of lipids: Lab Results  Component Value Date   CHOL 134 04/22/2017   HDL 73.30 04/22/2017   LDLCALC 49 04/22/2017   TRIG 57.0 04/22/2017   CHOLHDL 2 04/22/2017  On Crestor 10.  Cardiologist: Dr. Tresa Endo.  - last eye exam was in 10/2016: + DR.  Still on intraocular injections . Dr. Tomie China. -+ numbness and tingling in her feet.  She also has hypertension.  ROS: Constitutional: no weight gain/no weight loss, no fatigue, no subjective hyperthermia, no subjective hypothermia Eyes: no blurry vision,  no xerophthalmia ENT: no sore throat, no nodules palpated in throat, no dysphagia, no odynophagia, no hoarseness Cardiovascular: no CP/no SOB/no palpitations/no leg swelling Respiratory: no cough/no SOB/no wheezing Gastrointestinal: no N/no V/no D/no C/no acid reflux Musculoskeletal: no muscle aches/no joint aches Skin: no rashes, no hair loss Neurological: no tremors/+ numbness/+ tingling/no dizziness  I reviewed pt's medications, allergies, PMH, social hx, family hx, and changes were documented in the history of present illness. Otherwise, unchanged from my initial visit note.   Past Medical History:  Diagnosis Date  . CAD (coronary artery disease)   . History of nuclear stress test 11/22/2011   bruce myoview; normal pattern of perfusio; post-stress EF 76%; low risk scan  . Hyperlipidemia   . Hypertension   . 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 Type 2 diabetes mellitus (HCC)    Past Surgical History:  Procedure Laterality Date  . CARDIAC CATHETERIZATION  05/07/2007   EF 55%, focal mild hypocontractility in mid-distal inferior wall & mid posterolateral wall; severe multivessel CAD - susequent CABGx6 (Dr. Bishop Limbo)  . CORONARY ARTERY BYPASS GRAFT  05/09/2007   LIMA to LAD, veing to intermediate; SVG to OM1 & OM2; SVG  to acute marginal & distal RCA (Dr. Dorris FetchHendrickson)  . TRANSTHORACIC ECHOCARDIOGRAM  02/13/2010   EF =>55%, vigorous contraction EF 65%; LA mild-mod dilated; IV normal diameter - normal CVP; trace MR; mild TR; trace AV regurg   Social History   Social History  . Marital status: Widowed    Spouse name: N/A  . Number of children: 8   Occupational History  . homemaker   Social History Main Topics  . Smoking status: Never Smoker  . Smokeless tobacco: Never Used     Comment: quit 1960's  smoked very lightly.  . Alcohol use No  . Drug use: No     Social History Narrative    Married.   Retired.   Works as a Engineer, structuralCaregiver.    Enjoys helping other.    Current Outpatient Medications on File Prior to Visit  Medication Sig Dispense Refill  . Acetaminophen (TYLENOL 8 HOUR PO) Take 1 tablet by mouth every 8 (eight) hours as needed.     Marland Kitchen. aspirin 81 MG tablet Take 81 mg by mouth daily.    Marland Kitchen. glucose blood (ACCU-CHEK ACTIVE STRIPS) test strip Use as instructed to test blood sugar 3 times daily. 300 each 3  . insulin aspart (NOVOLOG FLEXPEN) 100 UNIT/ML FlexPen Inject 6-10 Units into the skin 3 (three) times daily with meals. (Patient taking differently: Inject 6 Units into the skin 3 (three) times daily with meals. ) 15 mL 11  . Insulin Pen Needle (CAREFINE PEN NEEDLES) 32G X 4 MM MISC Use 4x a day 300 each 4  . Lancets (ACCU-CHEK SOFT TOUCH) lancets Use as instructed to test blood sugar 3 times daily 100 each 5  . LEVEMIR FLEXTOUCH 100 UNIT/ML Pen Inject 30 Units daily into the skin. 15 mL 3  . losartan (COZAAR) 50 MG tablet TAKE 1 TABLET BY MOUTH ONCE DAILY 90 tablet 1  . metoprolol succinate (TOPROL-XL) 50 MG 24 hr tablet Take 1 tablet (50 mg total) by mouth daily. Take with or immediately following a meal. 30 tablet 6  . Multiple Vitamin (MULTIVITAMIN) capsule Take 1 capsule by mouth daily.    . pantoprazole (PROTONIX) 40 MG tablet TAKE 1 TABLET BY MOUTH ONCE DAILY 30 tablet 5  . Polyethyl Glycol-Propyl Glycol (SYSTANE OP) Apply 2 drops to eye 2 (two) times daily.     . rosuvastatin (CRESTOR) 10 MG tablet Take 1 tablet (10 mg total) by mouth daily. 90 tablet 1  . TRAVATAN Z 0.004 % SOLN ophthalmic solution Place 1 drop into both eyes at bedtime.     No current facility-administered medications on file prior to visit.    No Known Allergies Family History  Problem Relation Age of Onset  . Cancer Mother   . Diabetes Sister   . Cancer Child    Pt has FH of DM in [place], sister - died from DM complications (refused to start HD), sons - 1 son died from DM complications.  She  was under a tremendous amt of stress at her visit in 01/2017: buried her youngest son (cancer). He was 53.  PE: BP 124/82   Pulse 68   Ht 5\' 4"  (1.626 m)   Wt 124 lb 12.8 oz (56.6 kg)   SpO2 97%   BMI 21.42 kg/m  Wt Readings from Last 3 Encounters:  06/19/17 124 lb 12.8 oz (56.6 kg)  05/13/17 124 lb (56.2 kg)  04/22/17 127 lb 4 oz (57.7 kg)   Constitutional: Normal weight, in NAD Eyes: PERRLA,  EOMI, no exophthalmos ENT: moist mucous membranes, no thyromegaly, no cervical lymphadenopathy Cardiovascular: RRR, No MRG Respiratory: CTA B Gastrointestinal: abdomen soft, NT, ND, BS+ Musculoskeletal: no deformities, strength intact in all 4 Skin: moist, warm, no rashes Neurological: no tremor with outstretched hands, DTR normal in all 4  ASSESSMENT: 1. DM2, insulin-dependent, uncontrolled, with complications - CAD - s/p CABG x6 - CKD - DR - PN  2. HL  PLAN:  1. Patient with long-standing, uncontrolled, type 2 diabetes, on basal-bolus insulin regimen with was sugar at the end of last year after the death of her son.  She was not checking sugars then due to stress and grief.  I did not change the regimen then.  At last visit, she was not checking sugars but her HbA1c was better.  I strongly advised her to start checking sugars but did not change her regimen. - At this visit, she is telling me that she is drinking at least 1 Ensure cans a day, usually with a meal, not instead of a meal.  Her sugars are high at all times of the day, alternating with lower, normal, blood sugars.  I suspect that at least some of these are due to Ensure.  I strongly advised her not to during this anymore, but she can use this to  replace a meal, if absolutely needed.  Definitely not adding it to a meal, since it is very calorie dense.  If the sugars are still high after she makes this change, in 2 weeks, I advised her to slightly. - I suggested to:  Patient Instructions  Please continue: - Levemir  30 units  in am  STOP Ensure.  In 2 weeks, if sugars have not decreased, then increase: - Novolog 7-9 units before the 3 meals of the day  Please return in 3-4 months with your sugar log.   -  at last visit, 2 months ago, HbA1c was better, at 8.5%, decreased from 9.1%. - continue checking sugars at different times of the day - check 3x a day, rotating checks - advised for yearly eye exams >> she is UTD - Return to clinic in 3-4 mo with sugar log   2. HL - Reviewed latest lipid panel 04/2017: LDL at goal, as were the rest of the lipid fractions - Continues Crestor per cardiology, without side effects.   Carlus Pavlov, MD PhD [facility]

## 2017-06-19 NOTE — Patient Instructions (Signed)
Please continue: - Levemir  30 units in am  STOP Ensure.  In 2 weeks, if sugars have not decreased, then increase: - Novolog 7-9 units before the 3 meals of the day  Please return in 3-4 months with your sugar log.

## 2017-07-01 ENCOUNTER — Telehealth: Payer: Self-pay | Admitting: Internal Medicine

## 2017-07-01 DIAGNOSIS — Z794 Long term (current) use of insulin: Principal | ICD-10-CM

## 2017-07-01 DIAGNOSIS — E119 Type 2 diabetes mellitus without complications: Secondary | ICD-10-CM

## 2017-07-01 MED ORDER — LEVEMIR FLEXTOUCH 100 UNIT/ML ~~LOC~~ SOPN: 30.0000 [IU] | PEN_INJECTOR | Freq: Every day | SUBCUTANEOUS | 3 refills | Status: DC

## 2017-07-01 NOTE — Telephone Encounter (Signed)
Refill sent, no request from pharmacy received.

## 2017-07-01 NOTE — Telephone Encounter (Signed)
LEVEMIR FLEXTOUCH 100 UNIT/ML Pen    Patient stated that she has reached out to pharmacy to have a refill request sent to our office but they are stating that we have not sent refills into the pharmacy    [facility] [address] - [place], [place] - [phone]

## 2017-07-15 LAB — HM DIABETES EYE EXAM

## 2017-07-23 ENCOUNTER — Other Ambulatory Visit: Payer: Self-pay | Admitting: Cardiovascular Disease

## 2017-07-23 ENCOUNTER — Other Ambulatory Visit: Payer: Self-pay | Admitting: Primary Care

## 2017-07-23 DIAGNOSIS — E785 Hyperlipidemia, unspecified: Secondary | ICD-10-CM

## 2017-07-23 MED ORDER — METOPROLOL SUCCINATE ER 50 MG PO TB24: ORAL_TABLET | ORAL | 1 refills | Status: DC

## 2017-07-23 MED ORDER — ROSUVASTATIN CALCIUM 10 MG PO TABS: 10.0000 mg | ORAL_TABLET | Freq: Every day | ORAL | 1 refills | Status: DC

## 2017-07-23 NOTE — Telephone Encounter (Signed)
Per chart review, metoprolol succinate was refilled today 07/23/17. PCP refills crestor. Patient called w/this info

## 2017-07-23 NOTE — Telephone Encounter (Signed)
New Message:         *STAT* If patient is at the pharmacy, call can be transferred to refill team.   1. Which medications need to be refilled? (please list name of each medication and dose if known) rosuvastatin (CRESTOR) 10 MG tablet  metoprolol succinate (TOPROL-XL) 50 MG 24 hr tablet  2. Which pharmacy/location (including street and city if local pharmacy) is medication to be sent to?[facility] [address] - [place], [place] - [phone]  3. Do they need a 30 day or 90 day supply? 30    Pt states she is completely out of this medication.

## 2017-09-19 ENCOUNTER — Other Ambulatory Visit: Payer: Self-pay

## 2017-09-19 MED ORDER — INSULIN PEN NEEDLE 32G X 4 MM MISC
4 refills | Status: DC
Start: 2017-09-19 — End: 2018-12-25

## 2017-10-05 ENCOUNTER — Other Ambulatory Visit: Payer: Self-pay | Admitting: Internal Medicine

## 2017-10-15 ENCOUNTER — Ambulatory Visit (INDEPENDENT_AMBULATORY_CARE_PROVIDER_SITE_OTHER): Payer: Medicare HMO | Admitting: Internal Medicine

## 2017-10-15 ENCOUNTER — Encounter: Payer: Self-pay | Admitting: Cardiovascular Disease

## 2017-10-15 ENCOUNTER — Encounter: Payer: Self-pay | Admitting: Internal Medicine

## 2017-10-15 ENCOUNTER — Ambulatory Visit: Payer: Medicare HMO | Admitting: Cardiovascular Disease

## 2017-10-15 VITALS — BP 150/80 | HR 70 | Ht 64.0 in | Wt 127.0 lb

## 2017-10-15 VITALS — BP 140/72 | HR 75 | Ht 64.0 in | Wt 122.0 lb

## 2017-10-15 DIAGNOSIS — I10 Essential (primary) hypertension: Secondary | ICD-10-CM | POA: Diagnosis not present

## 2017-10-15 DIAGNOSIS — E1159 Type 2 diabetes mellitus with other circulatory complications: Secondary | ICD-10-CM

## 2017-10-15 DIAGNOSIS — I2510 Atherosclerotic heart disease of native coronary artery without angina pectoris: Secondary | ICD-10-CM | POA: Diagnosis not present

## 2017-10-15 DIAGNOSIS — R42 Dizziness and giddiness: Secondary | ICD-10-CM

## 2017-10-15 DIAGNOSIS — E785 Hyperlipidemia, unspecified: Secondary | ICD-10-CM

## 2017-10-15 DIAGNOSIS — Z794 Long term (current) use of insulin: Secondary | ICD-10-CM

## 2017-10-15 DIAGNOSIS — Z951 Presence of aortocoronary bypass graft: Secondary | ICD-10-CM | POA: Diagnosis not present

## 2017-10-15 DIAGNOSIS — E1165 Type 2 diabetes mellitus with hyperglycemia: Secondary | ICD-10-CM

## 2017-10-15 DIAGNOSIS — K219 Gastro-esophageal reflux disease without esophagitis: Secondary | ICD-10-CM

## 2017-10-15 LAB — POCT GLYCOSYLATED HEMOGLOBIN (HGB A1C): Hemoglobin A1C: 9.4 % — AB (ref 4.0–5.6)

## 2017-10-15 MED ORDER — LOSARTAN POTASSIUM 50 MG PO TABS: 75.0000 mg | ORAL_TABLET | Freq: Every day | ORAL | 3 refills | Status: DC

## 2017-10-15 NOTE — Addendum Note (Signed)
Addended by: Darliss Ridgel I on: 10/15/2017 11:11 AM   Modules accepted: Orders
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
## 2017-10-15 NOTE — Patient Instructions (Signed)
Medication Instructions:  INCREASE losartan to 75 mg daily  Follow-Up: Your physician wants you to follow-up in: 6 months with Dr. Tresa Endo.  You will receive a reminder letter in the mail two months in advance. If you don't receive a letter, please call our office to schedule the follow-up appointment.   Any Other Special Instructions Will Be Listed Below (If Applicable).     If you need a refill on your cardiac medications before your next appointment, please call your pharmacy.

## 2017-10-15 NOTE — Progress Notes (Signed)
Patient ID: Laura Ferguson, female   DOB: 02-Aug-1932, 84 y.o.   MRN: [id number]   HPI: Laura Ferguson is a 84 y.o.-year-old female, returning for f/u for DM2, dx in 1990s, insulin-dependent for years, uncontrolled, with complications (CAD - s/p CABG x6, CKD, DR, PN). Last visit 4 months ago.  At last visit, she was drinking Ensure with at least a meal a day. She stopped this.  She is still grieving for her son. She is sad and cries in the office today.   Last hemoglobin A1c was: Lab Results  Component Value Date   HGBA1C 8.5 04/18/2017   HGBA1C 9.1 01/17/2017   HGBA1C 8.8 10/16/2016  10/16/2016: HbA1c 8.8%  She is now on: - Levemir 30 units in a.m. - Novolog: 6 >> 7 >> 6 units before a smaller meal 8 >> 9 >> 10 units before a larger meal   She checks sugars 4x a day - am: 181, 266 >> 77-283 >> 97-198, 279, 324 (snacks in the middle of the night) - 2h after b'fast: 64, 83-189, 207, 253 - before lunch: 84 >> 86-211, 333 >> 83-208 - 2h after lunch: n/c >> 64, 85-164, 236 - before dinner: 151 >> 186 >> 68-198, 257 >> n/c  - 2h after dinner: 408 >> 110, 140 >> n/c - bedtime: 217, 209 >> n/c - nighttime: n/c Lowest sugar was 181 >> 68 >> 64; it is unclear at which level she has hypoglycemia awareness. Highest sugar was 266 >> 333 >> 324  Glucometer: AccuChek  Pt's meals are: - Breakfast: oatmeal, grits, apple sauce - Lunch: tuna sandwich, bologna - Dinner: soup, sandwich  - + CKD, last BUN/creatinine:  Lab Results  Component Value Date   BUN 21 04/22/2017   BUN 42 (H) 08/09/2016   CREATININE 1.25 (H) 04/22/2017   CREATININE 1.48 (H) 08/09/2016  On Losartan 50. -+ HL; last set of lipids: Lab Results  Component Value Date   CHOL 134 04/22/2017   HDL 73.30 04/22/2017   LDLCALC 49 04/22/2017   TRIG 57.0 04/22/2017   CHOLHDL 2 04/22/2017  On Crestor 10.  Cardiologist: Dr. Tresa Endo.  - last eye exam was in 10/2016: + DR.  On IO inj's. Dr. Tomie China. - + numbness and tingling in her  feet.  She also has HTN.  ROS: Constitutional: + weight gain/no weight loss, + fatigue, no subjective hyperthermia,+ subjective hypothermia Eyes: + blurry vision, no xerophthalmia ENT: no sore throat, no nodules palpated in throat, + dysphagia, no odynophagia, no hoarseness Cardiovascular: no CP/no SOB/no palpitations/no leg swelling Respiratory: no cough/no SOB/no wheezing Gastrointestinal: no N/no V/no D/no C/no acid reflux Musculoskeletal: no muscle aches/no joint aches Skin: no rashes, no hair loss Neurological: no tremors/+ numbness/+ tingling/no dizziness  I reviewed pt's medications, allergies, PMH, social hx, family hx, and changes were documented in the history of present illness. Otherwise, unchanged from my initial visit note.   Past Medical History:  Diagnosis Date  . CAD (coronary artery disease)   . History of nuclear stress test 11/22/2011   bruce myoview; normal pattern of perfusio; post-stress EF 76%; low risk scan  . Hyperlipidemia   . Hypertension   . 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 Type 2 diabetes mellitus (HCC)    Past Surgical History:  Procedure Laterality Date  . CARDIAC CATHETERIZATION  05/07/2007   EF  55%, focal mild hypocontractility in mid-distal inferior wall & mid posterolateral wall; severe multivessel CAD - susequent CABGx6 (Dr. Bishop Limbo)  . CORONARY ARTERY BYPASS GRAFT  05/09/2007   LIMA to LAD, veing to intermediate; SVG to OM1 & OM2; SVG to acute marginal & distal RCA (Dr. Dorris Fetch)  . TRANSTHORACIC ECHOCARDIOGRAM  02/13/2010   EF =>55%, vigorous contraction EF 65%; LA mild-mod dilated; IV normal diameter - normal CVP; trace MR; mild TR; trace AV regurg   Social History   Social History  . Marital status: Widowed    Spouse name: N/A  . Number of children: 8   Occupational History  . homemaker   Social History Main Topics  . Smoking status: Never  Smoker  . Smokeless tobacco: Never Used     Comment: quit 1960's  smoked very lightly.  . Alcohol use No  . Drug use: No     Social History Narrative   Married.   Retired.   Works as a Engineer, structural.    Enjoys helping other.    Current Outpatient Medications on File Prior to Visit  Medication Sig Dispense Refill  . Acetaminophen (TYLENOL 8 HOUR PO) Take 1 tablet by mouth every 8 (eight) hours as needed.     Marland Kitchen aspirin 81 MG tablet Take 81 mg by mouth daily.    Marland Kitchen glucose blood (ACCU-CHEK ACTIVE STRIPS) test strip Use as instructed to test blood sugar 3 times daily. 300 each 3  . Insulin Pen Needle (CAREFINE PEN NEEDLES) 32G X 4 MM MISC Use 4x a day 300 each 4  . Lancets (ACCU-CHEK SOFT TOUCH) lancets Use as instructed to test blood sugar 3 times daily 100 each 5  . LEVEMIR FLEXTOUCH 100 UNIT/ML Pen Inject 30 Units into the skin daily. 15 mL 3  . losartan (COZAAR) 50 MG tablet TAKE 1 TABLET BY MOUTH ONCE DAILY 90 tablet 1  . metoprolol succinate (TOPROL-XL) 50 MG 24 hr tablet TAKE 1 TABLET BY MOUTH DAILY. TAKE WITH OR IMMEDIATELY FOLLOWING A MEAL. 90 tablet 1  . Multiple Vitamin (MULTIVITAMIN) capsule Take 1 capsule by mouth daily.    Marland Kitchen NOVOLOG FLEXPEN 100 UNIT/ML FlexPen INJECT 6 TO 10 UNITS INTO THE SKIN THREE TIMES DAILY WITH MEALS 15 pen 8  . pantoprazole (PROTONIX) 40 MG tablet TAKE 1 TABLET BY MOUTH ONCE DAILY 90 tablet 1  . Polyethyl Glycol-Propyl Glycol (SYSTANE OP) Apply 2 drops to eye 2 (two) times daily.     . rosuvastatin (CRESTOR) 10 MG tablet Take 1 tablet (10 mg total) by mouth daily. 90 tablet 1  . TRAVATAN Z 0.004 % SOLN ophthalmic solution Place 1 drop into both eyes at bedtime.     No current facility-administered medications on file prior to visit.    No Known Allergies Family History  Problem Relation Age of Onset  . Cancer Mother   . Diabetes Sister   . Cancer Child    Pt has FH of DM in [place], sister - died from DM complications (refused to start HD), sons - 1 son  died from DM complications.  Her youngest son died 01/201 (cancer). He was 53.  PE: BP (!) 150/80   Pulse 70   Ht 5\' 4"  (1.626 m) Comment: measured  Wt 127 lb (57.6 kg)   SpO2 98%   BMI 21.80 kg/m  Wt Readings from Last 3 Encounters:  10/15/17 127 lb (57.6 kg)  06/19/17 124 lb 12.8 oz (56.6 kg)  05/13/17 124 lb (56.2 kg)  Constitutional: normal weight, in NAD Eyes: PERRLA, EOMI, no exophthalmos ENT: moist mucous membranes, no thyromegaly, no cervical lymphadenopathy Cardiovascular: RRR, No MRG Respiratory: CTA B Gastrointestinal: abdomen soft, NT, ND, BS+ Musculoskeletal: no deformities, strength intact in all 4 Skin: moist, warm, no rashes Neurological: no tremor with outstretched hands, DTR normal in all 4  ASSESSMENT: 1. DM2, insulin-dependent, uncontrolled, with complications - CAD - s/p CABG x6 - CKD - DR - PN  2. HL  PLAN:  1. Patient with long-standing type 2 diabetes, on basal-bolus insulin regimen with deterioration of diabetes control after the death of her son.  She was not checking sugars due to stress and grief.  We did not change the regimen then.  Her HbA1c improved, however she then started to drink Ensure, at least 1 can a day, usually with a meal, not instead of a meal.  Her sugars were high at all times of the day at last visit, but she also had occasional lower, normal, blood sugars.  I strongly advised her to stop the Ensure or at least to use it only to replace a meal, if absolutely needed.  I advised her to increase the NovoLog if the sugars do not decrease after stopping Ensure. -At this visit, she tells me she is still sad and depressed about her younger son's death and she finds it hard to care for herself.  She is checking her sugars consistently, 4 times a day and does bolus insulin, however, she is more lax with her diet.  She feels she is not eating enough and she also wakes up in eats during the night.  As a consequence, sugars are very variable  especially in the morning, from will within target to sugars in the 300s.  We discussed that she will need to stop eating at night unless her sugars are low.  I advised her to keep glucose tablets on her nightstand, in case needed.  However, if she does feel that she absolutely needs to eat, then I advised her to cover the snack with insulin. -I also recommended to try to split the Levemir into 2 doses, 66% in the morning and 33% at bedtime.  I am reticent to move the entire dose at night due to the fact that she did have low blood sugars during the night when she was taking the entire dose at bedtime -I did advise her to try to talk to a counselor, which may help her through this difficult time in her life - I suggested to:  Patient Instructions  Please split: - Levemir to 20 units in am and 10 units at bedtime  Continue: - Novolog 6-10 units before meals  Stop eating at night. If you do eat at night >> may need at least 4-5 units of insulin before this snack.  Please return in 3-4 months with your sugar log.   - today, HbA1c is 9.4% (higher) - continue checking sugars at different times of the day - check 1x a day, rotating checks - advised for yearly eye exams >> she is UTD - + flu shot today - Return to clinic in 3-4 mo with sugar log    2. HL - Reviewed latest lipid panel from 04/2017: all fractions at goal Lab Results  Component Value Date   CHOL 134 04/22/2017   HDL 73.30 04/22/2017   LDLCALC 49 04/22/2017   TRIG 57.0 04/22/2017   CHOLHDL 2 04/22/2017  - Continues  Crestor without side effects.  Silvestre Mesi  Cruzita Lederer, MD PhD [facility]

## 2017-10-15 NOTE — Patient Instructions (Addendum)
Please split: - Levemir to 20 units in am and 10 units at bedtime  Continue: - Novolog 6-10 units before meals  Stop eating at night. If you do eat at night >> may need at least 4-5 units of insulin before this snack.  Please return in 3-4 months with your sugar log.

## 2017-10-16 ENCOUNTER — Encounter: Payer: Self-pay | Admitting: Primary Care

## 2017-10-16 ENCOUNTER — Ambulatory Visit (INDEPENDENT_AMBULATORY_CARE_PROVIDER_SITE_OTHER): Payer: Medicare HMO | Admitting: Primary Care

## 2017-10-16 VITALS — BP 144/70 | HR 78 | Temp 98.0°F | Ht 64.0 in | Wt 124.5 lb

## 2017-10-16 DIAGNOSIS — E1159 Type 2 diabetes mellitus with other circulatory complications: Secondary | ICD-10-CM

## 2017-10-16 DIAGNOSIS — F32A Depression, unspecified: Secondary | ICD-10-CM | POA: Insufficient documentation

## 2017-10-16 DIAGNOSIS — E785 Hyperlipidemia, unspecified: Secondary | ICD-10-CM

## 2017-10-16 DIAGNOSIS — F411 Generalized anxiety disorder: Secondary | ICD-10-CM | POA: Insufficient documentation

## 2017-10-16 DIAGNOSIS — Z794 Long term (current) use of insulin: Secondary | ICD-10-CM

## 2017-10-16 DIAGNOSIS — I10 Essential (primary) hypertension: Secondary | ICD-10-CM

## 2017-10-16 DIAGNOSIS — F419 Anxiety disorder, unspecified: Secondary | ICD-10-CM | POA: Insufficient documentation

## 2017-10-16 LAB — LIPID PANEL
Cholesterol: 130 mg/dL (ref 0–200)
HDL: 68.5 mg/dL (ref >39.00)
LDL Cholesterol: 46 mg/dL (ref 0–99)
NONHDL: 61.54
Total CHOL/HDL Ratio: 2
Triglycerides: 79 mg/dL (ref 0.0–149.0)
VLDL: 15.8 mg/dL (ref 0.0–40.0)

## 2017-10-16 LAB — BASIC METABOLIC PANEL
BUN: 16 mg/dL (ref 6–23)
CALCIUM: 9.5 mg/dL (ref 8.4–10.5)
CO2: 28 mEq/L (ref 19–32)
Chloride: 106 mEq/L (ref 96–112)
Creatinine, Ser: 1.35 mg/dL — ABNORMAL HIGH (ref 0.40–1.20)
GFR: 48.12 mL/min — AB (ref >60.00)
Glucose, Bld: 215 mg/dL — ABNORMAL HIGH (ref 70–99)
Potassium: 3.7 mEq/L (ref 3.5–5.1)
SODIUM: 141 meq/L (ref 135–145)

## 2017-10-16 NOTE — Assessment & Plan Note (Signed)
Uncontrolled. Likely secondary to anxiety that leads to snacking and stress. Referral placed for therapy.   Continue current treatment plan per endocrinology.

## 2017-10-16 NOTE — Patient Instructions (Signed)
Stop by the lab prior to leaving today. I will notify you of your results once received.   Continue to monitor your blood pressure on the new dose of losartan 75 mg.   You will be contacted regarding your referral to therapy.  Please let us know if you have not been contacted within one week.   Please schedule a physical with me and a medicare wellness visit with our nurse in 6 months.   It was a pleasure to see you today!

## 2017-10-16 NOTE — Assessment & Plan Note (Signed)
LDL in April 2019 under good control at 49. Goal LDL <70. Repeat lipids pending. Continue rosuvastatin 10 mg.

## 2017-10-16 NOTE — Progress Notes (Signed)
Subjective:    Patient ID: Laura Ferguson, female    DOB: 1932/03/20, 84 y.o.   MRN: [id number]  HPI  Laura Ferguson is an 84 year old female who presents today for follow up.  1) Type 2 Diabetes: Currently following with endocrinology. She is managed on Levemir 30 units daily, Novolog 6-10 units TID. Her last A1C was 9.4 yesterday during her appointment with her endocrinology. She has been under a lot of stress with personal life and she feels that this has led to hyperglycemia and also due to increased snacking. She is interested in seeing a therapist.   2) Essential Hypertension: Currently managed on losartan 75 mg, metoprolol succinate 50 mg. She is following with cardiology with her last visit being yesterday. Given her BP level her losartan was increased to 75 mg for a BP goal of 130/80 or below. She denies chest pain, dizziness, shortness of breath.   BP Readings from Last 3 Encounters:  10/16/17 (!) 144/70  10/15/17 140/72  10/15/17 (!) 150/80   3) Hyperlipidemia: Currently managed on rosuvastatin 10 mg. Her last lipid panel was in April 2019 with LDL of 49. She denies myalgias. She is active with walking most everyday of the week.   4) GAD: Chronic history with symptoms of daily worry. She has a lot of personal stress from family. She has lost both her son and grandson over the years. She thinks that her stress has lead to hyperglycemia and further to uncontrolled diabetes. She is interested in meeting with a therapist.   Review of Systems  Respiratory: Negative for shortness of breath.   Cardiovascular: Negative for chest pain.  Neurological: Negative for dizziness and headaches.  Psychiatric/Behavioral: The patient is nervous/anxious.        See HPI       Past Medical History:  Diagnosis Date  . CAD (coronary artery disease)   . History of nuclear stress test 11/22/2011   bruce myoview; normal pattern of perfusio; post-stress EF 76%; low risk scan  . Hyperlipidemia   .  Hypertension   . 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 Type 2 diabetes mellitus (HCC)      Social History   Socioeconomic History  . Marital status: Widowed    Spouse name: Not on file  . Number of children: 7  . Years of education: Not on file  . Highest education level: Not on file  Occupational History  . Not on file  Social Needs  . Financial resource strain: Not on file  . Food insecurity:    Worry: Not on file    Inability: Not on file  . Transportation needs:    Medical: Not on file    Non-medical: Not on file  Tobacco Use  . Smoking status: Never Smoker  . Smokeless tobacco: Never Used  . Tobacco comment: quit 1960's  smoked very lightly.  Substance and Sexual Activity  . Alcohol use: No    Alcohol/week: 0.0 standard drinks  . Drug use: No  . Sexual activity: Not on file  Lifestyle  . Physical activity:    Days per week: Not on file    Minutes per session: Not on file  . Stress: Not on file  Relationships  . Social connections:    Talks on phone: Not on file    Gets together: Not on file  Attends religious service: Not on file    Active member of club or organization: Not on file    Attends meetings of clubs or organizations: Not on file    Relationship status: Not on file  . Intimate partner violence:    Fear of current or ex partner: Not on file    Emotionally abused: Not on file    Physically abused: Not on file    Forced sexual activity: Not on file  Other Topics Concern  . Not on file  Social History Narrative   Married.   Retired.   Works as a Engineer, structural.    Enjoys helping other.     Past Surgical History:  Procedure Laterality Date  . CARDIAC CATHETERIZATION  05/07/2007   EF 55%, focal mild hypocontractility in mid-distal inferior wall & mid posterolateral wall; severe multivessel CAD - susequent CABGx6 (Dr. Bishop Limbo)  . CORONARY ARTERY BYPASS GRAFT   05/09/2007   LIMA to LAD, veing to intermediate; SVG to OM1 & OM2; SVG to acute marginal & distal RCA (Dr. Dorris Fetch)  . TRANSTHORACIC ECHOCARDIOGRAM  02/13/2010   EF =>55%, vigorous contraction EF 65%; LA mild-mod dilated; IV normal diameter - normal CVP; trace MR; mild TR; trace AV regurg    Family History  Problem Relation Age of Onset  . Cancer Mother   . Diabetes Sister   . Cancer Child     No Known Allergies  Current Outpatient Medications on File Prior to Visit  Medication Sig Dispense Refill  . Acetaminophen (TYLENOL 8 HOUR PO) Take 1 tablet by mouth every 8 (eight) hours as needed.     Marland Kitchen aspirin 81 MG tablet Take 81 mg by mouth daily.    Marland Kitchen glucose blood (ACCU-CHEK ACTIVE STRIPS) test strip Use as instructed to test blood sugar 3 times daily. 300 each 3  . Insulin Pen Needle (CAREFINE PEN NEEDLES) 32G X 4 MM MISC Use 4x a day 300 each 4  . Lancets (ACCU-CHEK SOFT TOUCH) lancets Use as instructed to test blood sugar 3 times daily 100 each 5  . LEVEMIR FLEXTOUCH 100 UNIT/ML Pen Inject 30 Units into the skin daily. 15 mL 3  . losartan (COZAAR) 50 MG tablet Take 1.5 tablets (75 mg total) by mouth daily. 135 tablet 3  . metoprolol succinate (TOPROL-XL) 50 MG 24 hr tablet TAKE 1 TABLET BY MOUTH DAILY. TAKE WITH OR IMMEDIATELY FOLLOWING A MEAL. 90 tablet 1  . Multiple Vitamin (MULTIVITAMIN) capsule Take 1 capsule by mouth daily.    Marland Kitchen NOVOLOG FLEXPEN 100 UNIT/ML FlexPen INJECT 6 TO 10 UNITS INTO THE SKIN THREE TIMES DAILY WITH MEALS 15 pen 8  . pantoprazole (PROTONIX) 40 MG tablet TAKE 1 TABLET BY MOUTH ONCE DAILY 90 tablet 1  . Polyethyl Glycol-Propyl Glycol (SYSTANE OP) Apply 2 drops to eye 2 (two) times daily.     . rosuvastatin (CRESTOR) 10 MG tablet Take 1 tablet (10 mg total) by mouth daily. 90 tablet 1  . TRAVATAN Z 0.004 % SOLN ophthalmic solution Place 1 drop into both eyes at bedtime.     No current facility-administered medications on file prior to visit.     BP (!)  144/70   Pulse 78   Temp 98 F (36.7 C) (Oral)   Ht 5\' 4"  (1.626 m)   Wt 124 lb 8 oz (56.5 kg)   SpO2 98%   BMI 21.37 kg/m    Objective:   Physical Exam  Constitutional: She appears well-nourished.  Neck:  Neck supple.  Cardiovascular: Normal rate and regular rhythm.  Respiratory: Effort normal and breath sounds normal.  Skin: Skin is warm and dry.  Psychiatric: She has a normal mood and affect.           Assessment & Plan:

## 2017-10-16 NOTE — Assessment & Plan Note (Signed)
Chronic, has undergone several deaths in her family. Offered my condolences. Referral placed to therapy.

## 2017-10-16 NOTE — Assessment & Plan Note (Signed)
Above goal today, although losartan was increased to 75 mg yesterday. Goal BP is 130/80 or below. Will have patient continue to monitor.

## 2017-10-17 NOTE — Addendum Note (Signed)
Addended by: Chana Bode on: 10/17/2017 03:46 PM   Modules accepted: Orders

## 2017-11-01 ENCOUNTER — Telehealth: Payer: Self-pay

## 2017-11-01 NOTE — Telephone Encounter (Signed)
Copied from CRM [id number]. Topic: General - Other >> Oct 31, 2017 12:41 PM Mcneil, Ja-Kwan wrote: Reason for CRM: Pt states she received a call from someone named Misty Stanley but she is not sure how to reach her. Pt states this was due to a recommendation from Vernona Rieger. Pt requests call back.

## 2017-11-01 NOTE — Telephone Encounter (Signed)
Left message on vm per dpr informing pt I do not see where Virl Axe, the Medicare wellness nurse left a message for her and that I had not called her. So I'm not sure if it was a "Misty Stanley" in this office that tried to contact her.

## 2017-11-07 ENCOUNTER — Ambulatory Visit: Payer: Medicare HMO | Admitting: Psychology

## 2017-11-12 ENCOUNTER — Ambulatory Visit (INDEPENDENT_AMBULATORY_CARE_PROVIDER_SITE_OTHER)
Admission: RE | Admit: 2017-11-12 | Discharge: 2017-11-12 | Disposition: A | Discharge status: Home / Self Care | Payer: Medicare HMO | Source: Ambulatory Visit | Attending: Primary Care | Admitting: Primary Care

## 2017-11-12 ENCOUNTER — Ambulatory Visit (INDEPENDENT_AMBULATORY_CARE_PROVIDER_SITE_OTHER): Payer: Medicare HMO | Admitting: Primary Care

## 2017-11-12 ENCOUNTER — Encounter: Payer: Self-pay | Admitting: Primary Care

## 2017-11-12 VITALS — BP 142/78 | HR 72 | Temp 98.0°F | Ht 64.0 in | Wt 126.8 lb

## 2017-11-12 DIAGNOSIS — M25551 Pain in right hip: Secondary | ICD-10-CM

## 2017-11-12 DIAGNOSIS — M549 Dorsalgia, unspecified: Secondary | ICD-10-CM

## 2017-11-12 DIAGNOSIS — M545 Low back pain: Secondary | ICD-10-CM | POA: Diagnosis not present

## 2017-11-12 DIAGNOSIS — G8929 Other chronic pain: Secondary | ICD-10-CM | POA: Diagnosis not present

## 2017-11-12 NOTE — Patient Instructions (Signed)
Start acetaminophen (Tylenol) for arthritis pain. Take 650 mg every 6 to 8 hours as needed for pain. Do this for at least one week.  Please call me Friday if no improvement in your symptoms.   Complete xray(s) prior to leaving today. I will notify you of your results once received.  Make sure to stretch your hip and lower back every night before bed and every morning when you wake.  It was a pleasure to see you today!

## 2017-11-12 NOTE — Progress Notes (Signed)
Subjective:    Patient ID: Laura Ferguson, female    DOB: October 13, 1932, 84 y.o.   MRN: [id number]  HPI  Laura Ferguson is an 84 year old female with a history of hypertension, uncontrolled type 2 diabetes who presents today with a chief complaint of back and hip pain.  Her pain is located to the right lower back and hip which has been chronic for years, gradually worse over the last several months, increased this morning since she was laying down on her back and cleaning out a bathroom cabinet yesterday. She describes her pain as a dull, nagging pain that is intermittent. Her pain will radiate down to the right knee. She denies any trauma/injury/falls, numbness/tingling to the right lower extremity, except for in the feet.   She's not been taking anything OTC for her symptoms.   Review of Systems  Musculoskeletal: Positive for arthralgias.  Skin: Negative for color change.  Neurological: Negative for numbness.       Past Medical History:  Diagnosis Date  . CAD (coronary artery disease)   . History of nuclear stress test 11/22/2011   bruce myoview; normal pattern of perfusio; post-stress EF 76%; low risk scan  . Hyperlipidemia   . Hypertension   . 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 Type 2 diabetes mellitus (HCC)      Social History   Socioeconomic History  . Marital status: Widowed    Spouse name: Not on file  . Number of children: 7  . Years of education: Not on file  . Highest education level: Not on file  Occupational History  . Not on file  Social Needs  . Financial resource strain: Not on file  . Food insecurity:    Worry: Not on file    Inability: Not on file  . Transportation needs:    Medical: Not on file    Non-medical: Not on file  Tobacco Use  . Smoking status: Never Smoker  . Smokeless tobacco: Never Used  . Tobacco comment: quit 1960's  smoked very lightly.  Substance and  Sexual Activity  . Alcohol use: No    Alcohol/week: 0.0 standard drinks  . Drug use: No  . Sexual activity: Not on file  Lifestyle  . Physical activity:    Days per week: Not on file    Minutes per session: Not on file  . Stress: Not on file  Relationships  . Social connections:    Talks on phone: Not on file    Gets together: Not on file    Attends religious service: Not on file    Active member of club or organization: Not on file    Attends meetings of clubs or organizations: Not on file    Relationship status: Not on file  . Intimate partner violence:    Fear of current or ex partner: Not on file    Emotionally abused: Not on file    Physically abused: Not on file    Forced sexual activity: Not on file  Other Topics Concern  . Not on file  Social History Narrative   Married.   Retired.   Works as a Engineer, structural.    Enjoys helping other.     Past Surgical History:  Procedure Laterality Date  . CARDIAC CATHETERIZATION  05/07/2007   EF 55%, focal mild hypocontractility in mid-distal inferior  wall & mid posterolateral wall; severe multivessel CAD - susequent CABGx6 (Dr. Bishop Limbo)  . CORONARY ARTERY BYPASS GRAFT  05/09/2007   LIMA to LAD, veing to intermediate; SVG to OM1 & OM2; SVG to acute marginal & distal RCA (Dr. Dorris Fetch)  . TRANSTHORACIC ECHOCARDIOGRAM  02/13/2010   EF =>55%, vigorous contraction EF 65%; LA mild-mod dilated; IV normal diameter - normal CVP; trace MR; mild TR; trace AV regurg    Family History  Problem Relation Age of Onset  . Cancer Mother   . Diabetes Sister   . Cancer Child     No Known Allergies  Current Outpatient Medications on File Prior to Visit  Medication Sig Dispense Refill  . Acetaminophen (TYLENOL 8 HOUR PO) Take 1 tablet by mouth every 8 (eight) hours as needed.     Marland Kitchen aspirin 81 MG tablet Take 81 mg by mouth daily.    Marland Kitchen glucose blood (ACCU-CHEK ACTIVE STRIPS) test strip Use as instructed to test blood sugar 3 times daily. 300  each 3  . Insulin Pen Needle (CAREFINE PEN NEEDLES) 32G X 4 MM MISC Use 4x a day 300 each 4  . Lancets (ACCU-CHEK SOFT TOUCH) lancets Use as instructed to test blood sugar 3 times daily 100 each 5  . LEVEMIR FLEXTOUCH 100 UNIT/ML Pen Inject 30 Units into the skin daily. 15 mL 3  . losartan (COZAAR) 50 MG tablet Take 1.5 tablets (75 mg total) by mouth daily. 135 tablet 3  . metoprolol succinate (TOPROL-XL) 50 MG 24 hr tablet TAKE 1 TABLET BY MOUTH DAILY. TAKE WITH OR IMMEDIATELY FOLLOWING A MEAL. 90 tablet 1  . Multiple Vitamin (MULTIVITAMIN) capsule Take 1 capsule by mouth daily.    Marland Kitchen NOVOLOG FLEXPEN 100 UNIT/ML FlexPen INJECT 6 TO 10 UNITS INTO THE SKIN THREE TIMES DAILY WITH MEALS 15 pen 8  . pantoprazole (PROTONIX) 40 MG tablet TAKE 1 TABLET BY MOUTH ONCE DAILY 90 tablet 1  . Polyethyl Glycol-Propyl Glycol (SYSTANE OP) Apply 2 drops to eye 2 (two) times daily.     . rosuvastatin (CRESTOR) 10 MG tablet Take 1 tablet (10 mg total) by mouth daily. 90 tablet 1  . TRAVATAN Z 0.004 % SOLN ophthalmic solution Place 1 drop into both eyes at bedtime.     No current facility-administered medications on file prior to visit.     BP (!) 142/78   Pulse 72   Temp 98 F (36.7 C) (Oral)   Ht 5\' 4"  (1.626 m)   Wt 126 lb 12 oz (57.5 kg)   SpO2 99%   BMI 21.76 kg/m    Objective:   Physical Exam  Constitutional: She appears well-nourished.  Cardiovascular: Normal rate and regular rhythm.  Respiratory: Effort normal and breath sounds normal.  Musculoskeletal: Normal range of motion.       Right hip: She exhibits normal range of motion, normal strength, no tenderness and no deformity.       Legs: 5/5 strength to bilateral lower extremities, negative straight leg raise bilaterally.  Skin: Skin is warm and dry.           Assessment & Plan:

## 2017-11-12 NOTE — Assessment & Plan Note (Addendum)
Recent episode to right lower back with radiation to right hip and down right extremity. She is a poor historian but seems like the majority of her pain is located to the right hip.  Lumbar spine plain films from 2018 with moderate DDD to lumbar spine. Check right hip xray today. Suspect arthritis to be the cause for symptoms. Will have her start with acetaminophen 650 mg every 6-8 hours as needed x 7 days.   Offered physical therapy for which she declines. Ambulates without difficulty in office. No assistive device.

## 2017-12-03 ENCOUNTER — Telehealth: Payer: Self-pay | Admitting: Internal Medicine

## 2017-12-03 ENCOUNTER — Ambulatory Visit: Payer: Medicare HMO | Admitting: Cardiovascular Disease

## 2017-12-03 MED ORDER — GLUCOSE BLOOD VI STRP: ORAL_STRIP | 3 refills | Status: DC

## 2017-12-03 NOTE — Telephone Encounter (Signed)
Patient notified

## 2017-12-03 NOTE — Telephone Encounter (Signed)
Patient is calling for a refill on glucose blood (ACCU-CHEK ACTIVE STRIPS) test  Patient stated they are unable to get in touch with pharmacy. [facility] [address] - [place], [place]

## 2017-12-03 NOTE — Telephone Encounter (Signed)
RX sent

## 2017-12-04 ENCOUNTER — Other Ambulatory Visit: Payer: Self-pay | Admitting: Internal Medicine

## 2017-12-05 ENCOUNTER — Other Ambulatory Visit: Payer: Self-pay

## 2017-12-05 MED ORDER — GLUCOSE BLOOD VI STRP: ORAL_STRIP | 4 refills | Status: DC

## 2017-12-05 MED ORDER — ACCU-CHEK GUIDE W/DEVICE KIT: 1.0000 | PACK | Freq: Every day | 1 refills | Status: DC

## 2017-12-05 NOTE — Telephone Encounter (Signed)
The new meter and test strips have been sent to preferred pharmacy

## 2018-01-13 ENCOUNTER — Other Ambulatory Visit: Payer: Self-pay

## 2018-01-13 MED ORDER — GLUCOSE BLOOD VI STRP: ORAL_STRIP | 4 refills | Status: DC

## 2018-01-21 ENCOUNTER — Other Ambulatory Visit: Payer: Self-pay | Admitting: Cardiovascular Disease

## 2018-01-21 DIAGNOSIS — E785 Hyperlipidemia, unspecified: Secondary | ICD-10-CM

## 2018-01-29 ENCOUNTER — Encounter: Payer: Self-pay | Admitting: Internal Medicine

## 2018-01-29 ENCOUNTER — Ambulatory Visit: Payer: Medicare HMO | Admitting: Internal Medicine

## 2018-01-29 VITALS — BP 136/80 | HR 79 | Ht 64.0 in | Wt 130.0 lb

## 2018-01-29 DIAGNOSIS — E11319 Type 2 diabetes mellitus with unspecified diabetic retinopathy without macular edema: Secondary | ICD-10-CM | POA: Insufficient documentation

## 2018-01-29 DIAGNOSIS — Z794 Long term (current) use of insulin: Secondary | ICD-10-CM

## 2018-01-29 DIAGNOSIS — E119 Type 2 diabetes mellitus without complications: Secondary | ICD-10-CM | POA: Insufficient documentation

## 2018-01-29 DIAGNOSIS — E785 Hyperlipidemia, unspecified: Secondary | ICD-10-CM | POA: Diagnosis not present

## 2018-01-29 DIAGNOSIS — E1159 Type 2 diabetes mellitus with other circulatory complications: Secondary | ICD-10-CM | POA: Diagnosis not present

## 2018-01-29 LAB — POCT GLYCOSYLATED HEMOGLOBIN (HGB A1C): Hemoglobin A1C: 8.9 % — AB (ref 4.0–5.6)

## 2018-01-29 MED ORDER — INSULIN ASPART 100 UNIT/ML FLEXPEN: PEN_INJECTOR | SUBCUTANEOUS | 8 refills | Status: DC

## 2018-01-29 NOTE — Addendum Note (Signed)
Addended by: Darliss RidgelONGER, Jency Schnieders I on: 01/29/2018 10:21 AM   Modules accepted: Orders

## 2018-01-29 NOTE — Patient Instructions (Addendum)
Please continue: - Levemir 30 units in am  Please increase: - Novolog 10-12 units before meals  Stop eating at night. If you do eat at night >> may need 5 units of insulin before this snack.  PLEASE BRING YOUR METER OR LOG WHEN YOU COME BACK.  Please return in 3 months with your sugar log.

## 2018-01-29 NOTE — Progress Notes (Signed)
Patient ID: Laura Ferguson, female   DOB: 22-Oct-1932, 85 y.o.   MRN: [id number]   HPI: Laura Ferguson is a 85 y.o.-year-old female, returning for f/u for DM2, dx in 1990s, insulin-dependent for years, uncontrolled, with complications (CAD - s/p CABG x6, CKD, DR, PN). Last visit 4 months ago.  At last visit, she was still grieving for her son.  I recommended this she saw a counselor to guide her through this difficult time in her life.  Sugars are higher with an HbA1c of 9.4%, increased.  She has a new meter >> readings deleted -did not bring any records and cannot remember her sugars?  She did not follow the instructions to split the Levemir - ?  She is conflicting in the info about her insulin dosing and her meals... It is unclear whether she is using the 4 to 5 units before snacks...  Last hemoglobin A1c was: Lab Results  Component Value Date   HGBA1C 9.4 (A) 10/15/2017   HGBA1C 8.5 04/18/2017   HGBA1C 9.1 01/17/2017  10/16/2016: HbA1c 8.8%  She is now on: - Levemir 30 units in a.m.  - Novolog 6-10 units before meals If you do eat at night >> may need 4-5 units of insulin before this snack.  She checks sugars 4x a day: - am: 181, 266 >> 77-283 >> 97-198, 279, 324 (snacks in the middle of the night) >> 220-300 (?) - 2h after b'fast: 64, 83-189, 207, 253 >> ? - before lunch: 84 >> 86-211, 333 >> 83-208 >> ? - 2h after lunch: n/c >> 64, 85-164, 236 >> ? - before dinner: 151 >> 186 >> 68-198, 257 >> n/c  >> ? - 2h after dinner: 408 >> 110, 140 >> n/c >> ? - bedtime: 217, 209 >> n/c - nighttime: n/c Lowest sugar was 64 >> 59; it is unclear at which level she has hypoglycemia awareness Highest sugar was 324 >> 300s.  Glucometer: AccuChek  Pt's meals are: - Breakfast: oatmeal, grits, apple sauce - Lunch: tuna sandwich, bologna - Dinner: soup, sandwich  - + CKD, last BUN/creatinine:  Lab Results  Component Value Date   BUN 16 10/16/2017   BUN 21 04/22/2017   CREATININE 1.35 (H)  10/16/2017   CREATININE 1.25 (H) 04/22/2017  On Losartan 50 >> 75 mg. -+ HL; last set of lipids: Lab Results  Component Value Date   CHOL 130 10/16/2017   HDL 68.50 10/16/2017   LDLCALC 46 10/16/2017   TRIG 79.0 10/16/2017   CHOLHDL 2 10/16/2017  On Crestor 10.  Cardiologist: Dr. Claiborne Billings.  - last eye exam was in 10/2016: + DR.  On IO inj's. Dr. Enis Slipper. - she does have numbness and tingling in her feet.  She also has HTN.  ROS: Constitutional: no weight gain/no weight loss, no fatigue, no subjective hyperthermia, no subjective hypothermia Eyes: no blurry vision, no xerophthalmia ENT: + sore throat, no nodules palpated in neck, + dysphagia, no odynophagia, no hoarseness Cardiovascular: no CP/no SOB/no palpitations/no leg swelling Respiratory: no cough/no SOB/no wheezing Gastrointestinal: no N/no V/no D/no C/no acid reflux Musculoskeletal: + muscle aches/no joint aches Skin: no rashes, no hair loss Neurological: no tremors/+ numbness/+ tingling/no dizziness  I reviewed pt's medications, allergies, PMH, social hx, family hx, and changes were documented in the history of present illness. Otherwise, unchanged from my initial visit note.   Past Medical History:  Diagnosis Date  . CAD (coronary artery disease)   . History of nuclear stress test  11/22/2011   bruce myoview; normal pattern of perfusio; post-stress EF 76%; low risk scan  . Hyperlipidemia   . Hypertension   . 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 Type 2 diabetes mellitus ([place])    Past Surgical History:  Procedure Laterality Date  . CARDIAC CATHETERIZATION  05/07/2007   EF 55%, focal mild hypocontractility in mid-distal inferior wall & mid posterolateral wall; severe multivessel CAD - susequent CABGx6 (Dr. Corky Downs)  . CORONARY ARTERY BYPASS GRAFT  05/09/2007   LIMA to LAD, veing to intermediate; SVG to OM1 & OM2; SVG to acute marginal &  distal RCA (Dr. Roxan Hockey)  . TRANSTHORACIC ECHOCARDIOGRAM  02/13/2010   EF =>55%, vigorous contraction EF 65%; LA mild-mod dilated; IV normal diameter - normal CVP; trace MR; mild TR; trace AV regurg   Social History   Social History  . Marital status: Widowed    Spouse name: N/A  . Number of children: 8   Occupational History  . homemaker   Social History Main Topics  . Smoking status: Never Smoker  . Smokeless tobacco: Never Used     Comment: quit 1960's  smoked very lightly.  . Alcohol use No  . Drug use: No     Social History Narrative   Married.   Retired.   Works as a Building control surveyor.    Enjoys helping other.    Current Outpatient Medications on File Prior to Visit  Medication Sig Dispense Refill  . Acetaminophen (TYLENOL 8 HOUR PO) Take 1 tablet by mouth every 8 (eight) hours as needed.     Marland Kitchen aspirin 81 MG tablet Take 81 mg by mouth daily.    . Blood Glucose Monitoring Suppl (ACCU-CHEK GUIDE) w/Device KIT 1 Device by Does not apply route daily. Use to test blood sugar daily Dx code E11.65 1 kit 1  . glucose blood (ACCU-CHEK AVIVA) test strip Use to check blood sugar 3 times daily 300 each 4  . Insulin Pen Needle (CAREFINE PEN NEEDLES) 32G X 4 MM MISC Use 4x a day 300 each 4  . Lancets (ACCU-CHEK SOFT TOUCH) lancets Use as instructed to test blood sugar 3 times daily 100 each 5  . LEVEMIR FLEXTOUCH 100 UNIT/ML Pen Inject 30 Units into the skin daily. 15 mL 3  . losartan (COZAAR) 50 MG tablet Take 1.5 tablets (75 mg total) by mouth daily. 135 tablet 3  . metoprolol succinate (TOPROL-XL) 50 MG 24 hr tablet TAKE 1 TABLET BY MOUTH DAILY. TAKE WITH OR IMMEDIATELY FOLLOWING A MEAL. 90 tablet 1  . Multiple Vitamin (MULTIVITAMIN) capsule Take 1 capsule by mouth daily.    Marland Kitchen NOVOLOG FLEXPEN 100 UNIT/ML FlexPen INJECT 6 TO 10 UNITS INTO THE SKIN THREE TIMES DAILY WITH MEALS 15 pen 8  . pantoprazole (PROTONIX) 40 MG tablet TAKE 1 TABLET BY MOUTH ONCE DAILY 90 tablet 1  . Polyethyl  Glycol-Propyl Glycol (SYSTANE OP) Apply 2 drops to eye 2 (two) times daily.     . rosuvastatin (CRESTOR) 10 MG tablet TAKE 1 TABLET BY MOUTH ONCE DAILY 90 tablet 2  . TRAVATAN Z 0.004 % SOLN ophthalmic solution Place 1 drop into both eyes at bedtime.     No current facility-administered medications on file prior to visit.    No Known Allergies Family History  Problem Relation Age of Onset  . Cancer Mother   .  Diabetes Sister   . Cancer Child    Pt has FH of DM in [place], sister - died from DM complications (refused to start HD), sons - 1 son died from DM complications.  Her youngest son died 03/05/2017 (cancer). He was 53.  PE: BP 136/80   Pulse 79   Ht 5' 4"  (1.626 m)   Wt 130 lb (59 kg)   SpO2 97%   BMI 22.31 kg/m  Wt Readings from Last 3 Encounters:  01/29/18 130 lb (59 kg)  11/12/17 126 lb 12 oz (57.5 kg)  10/16/17 124 lb 8 oz (56.5 kg)   Constitutional: normal weight, in NAD Eyes: PERRLA, EOMI, no exophthalmos ENT: moist mucous membranes, no thyromegaly, no cervical lymphadenopathy Cardiovascular: RRR, No MRG Respiratory: CTA B Gastrointestinal: abdomen soft, NT, ND, BS+ Musculoskeletal: no deformities, strength intact in all 4 Skin: moist, warm, no rashes Neurological: no tremor with outstretched hands, DTR normal in all 4  ASSESSMENT: 1. DM2, insulin-dependent, uncontrolled, with complications - CAD - s/p CABG x6 - CKD - DR - PN  2. HL  PLAN:  1. Patient with longstanding, uncontrolled, type 2 diabetes, on basal-bolus insulin regimen, with deterioration of diabetes control after the death of her son.  She was not checking sugars due to stress and grief.  We did not change her regimen then.  She started to drink Ensure afterwards and sugars increased even further.  I advised her to stop this blood sugars did not improve afterwards so we increased her NovoLog doses then.  At last visit, we split the Levemir into a larger dose in the morning and the smaller dose at  bedtime.  Sugars are very variable then especially in the morning and I strongly advised her to stop eating at night, however, if she did have a snack, to bolus NovoLog for this. -At this visit, she does not bring her sugar log or meter and she cannot remember her sugars well.  Also, it is not clear how much insulin she is taking.  She did not follow instructions to split the Levemir (I believe she forgot).  It is also unclear if she is taking insulin before snacks at night.  She gives me conflicting information about whether or not she is eating this and how much insulin she actually takes. -For now, since the sugars still appear high, I will increase her NovoLog slightly, and advised her to take 5 units of insulin before snacks but ideally, to skip snacking at night. -I will absolutely need a meter at next visit since she cannot remember her CBG values well - I suggested to:  Patient Instructions  Please continue: - Levemir 30 units in am  Please increase: - Novolog 10-12 units before meals  Stop eating at night. If you do eat at night >> may need 5 units of insulin before this snack.  PLEASE BRING YOUR METER OR LOG WHEN YOU COME BACK.  Please return in 3 months with your sugar log.   - today, HbA1c is 8.9% (lower) - continue checking sugars at different times of the day - check 1x a day, rotating checks - advised for yearly eye exams >> she is UTD - Return to clinic in 3-4 mo with sugar log    2. HL - Reviewed latest lipid panel from 10/2017 >> all fractions at goal Lab Results  Component Value Date   CHOL 130 10/16/2017   HDL 68.50 10/16/2017   LDLCALC 46 10/16/2017   TRIG 79.0  10/16/2017   CHOLHDL 2 10/16/2017  - ContinuesCrestor without side effects.  - time spent with the patient: 25 minutes, of which >50% was spent in obtaining information about her sugars, reviewing her previous labs, evaluations, and medication doses, counseling her about her condition and about the  importance of bringing her sugar log or meter to all appointments.  Philemon Kingdom, MD PhD [facility]

## 2018-01-31 ENCOUNTER — Ambulatory Visit: Payer: Self-pay

## 2018-01-31 NOTE — Telephone Encounter (Signed)
Patient called with c/o "low BP/dizziness." She says "I feel lightheaded sometimes, but not now. It can happen anytime, even when I'm sitting down. What I'm concerned about is my BP being low. When I woke up, it was 74/44. I kept checking it and before I called you, it was 101/44. I haven't taken my medicines this morning because I was afraid it would drop more. My heart rate is good, it was 74-75." I advised that I will send this note over to the office and someone will call with an appointment or advice on what she will need to do, she verbalized understanding.  Reason for Disposition . [1] Systolic BP 90-110 AND [2] taking blood pressure medications AND [3] NOT dizzy, lightheaded or weak  Answer Assessment - Initial Assessment Questions 1. BLOOD PRESSURE: "What is the blood pressure?" "Did you take at least two measurements 5 minutes apart?"     74/44; 101/44 2. ONSET: "When did you take your blood pressure?"    First thing this morning, then right before I called 3. HOW: "How did you obtain the blood pressure?" (e.g., visiting nurse, automatic home BP monitor)     Automatic home BP monitor 4. HISTORY: "Do you have a history of low blood pressure?" "What is your blood pressure normally?"     No 5. MEDICATIONS: "Are you taking any medications for blood pressure?" If yes: "Have they been changed recently?"     Yes 6. PULSE RATE: "Do you know what your pulse rate is?"      75 7. OTHER SYMPTOMS: "Have you been sick recently?" "Have you had a recent injury?"     No 8. PREGNANCY: "Is there any chance you are pregnant?" "When was your last menstrual period?"     No  Answer Assessment - Initial Assessment Questions 1. DESCRIPTION: "Describe your dizziness."     Lightheaded 2. LIGHTHEADED: "Do you feel lightheaded?" (e.g., somewhat faint, woozy, weak upon standing)     Yes when it happens, not dizzy now. 3. VERTIGO: "Do you feel like either you or the room is spinning or tilting?" (i.e.  vertigo)     No 4. SEVERITY: "How bad is it?"  "Do you feel like you are going to faint?" "Can you stand and walk?"   - MILD - walking normally   - MODERATE - interferes with normal activities (e.g., work, school)    - SEVERE - unable to stand, requires support to walk, feels like passing out now.      Mild when it happens, I still walk around 5. ONSET:  "When did the dizziness begin?"     Happens frequently and I think it's from my diabetes 6. AGGRAVATING FACTORS: "Does anything make it worse?" (e.g., standing, change in head position)     I don't know 7. HEART RATE: "Can you tell me your heart rate?" "How many beats in 15 seconds?"  (Note: not all patients can do this)       75 8. CAUSE: "What do you think is causing the dizziness?"     I don't know, it may be my diabetes or my BP 9. RECURRENT SYMPTOM: "Have you had dizziness before?" If so, ask: "When was the last time?" "What happened that time?"     Off and on anytime 10. OTHER SYMPTOMS: "Do you have any other symptoms?" (e.g., fever, chest pain, vomiting, diarrhea, bleeding)       No 11. PREGNANCY: "Is there any chance you are pregnant?" "When was your  last menstrual period?"       No  Protocols used: LOW BLOOD PRESSURE-A-AH, DIZZINESS - LIGHTHEADEDNESS-A-AH

## 2018-01-31 NOTE — Telephone Encounter (Signed)
This encounter was created in error - please disregard.

## 2018-01-31 NOTE — Telephone Encounter (Signed)
Spoke with patient, she has not taken any of her BP medications today and BP is now up to 138/80 with HR in 70's. She admits to poor hydration and little appetite over the last several days. We discussed to hold metoprolol for now. Start losartan if BP at or above 140/90 tomorrow. Discussed to continue to monitor BP over the weekend and I provided her with precautions for emergency department care.   Angelica Chessman, we will need to check on her Monday next week. Thanks for your help with this.

## 2018-02-03 NOTE — Telephone Encounter (Signed)
Noted and greatly appreciate the follow up. 

## 2018-02-03 NOTE — Telephone Encounter (Addendum)
Spoke with patient to follow up on her low blood pressures from last week.  She held her extra 1/2 on her losartan (only taking 50mg ) and her metoprolol each am over the weekend; however when her blood pressure would elevate during the day, she would go ahead and take the extra 1/2 equalling a total of 75mg  for the day.  Today she was 138/71 this am and held both bp medications; however, at 1pm she begin to feel a little dizzy and checked her bp at 167/71 so she took Losartan 75mg  and metoprolol 50mg  at that time.    Currently states she feels fine without symptoms.  BP ranges since Friday have been 130's/70's in the ams and 150-160's/70's in the pms.    I have asked that she continue to check her blood pressures and keep a log of the times, symptoms and how she is taking her medication between now and Wednesday.  She is to bring the log with her to her appt with Jae Dire on Wednesday.  Patient verbalizes understanding.   If symptoms worsen and bp drops below 100/40's then she should go to the ER for evaluation.  Patient denies having any further readings of 70's/40's.     FYI to K. Chestine Spore, NP

## 2018-02-05 ENCOUNTER — Encounter: Payer: Self-pay | Admitting: Primary Care

## 2018-02-05 ENCOUNTER — Ambulatory Visit (INDEPENDENT_AMBULATORY_CARE_PROVIDER_SITE_OTHER): Payer: Medicare HMO | Admitting: Primary Care

## 2018-02-05 DIAGNOSIS — I10 Essential (primary) hypertension: Secondary | ICD-10-CM | POA: Diagnosis not present

## 2018-02-05 NOTE — Assessment & Plan Note (Signed)
Blood pressure today in the office above goal, she has not had metoprolol today. Home blood pressure logs are stable, no further episodes of hypotension noted.  Heart rate is stable. We will have her resume her medications as prescribed. Suspect the hypotension could have come from dehydration as she admits to not drinking or eating much recently We discussed importance of proper hydration while taking medications. She will continue to monitor her blood pressure daily and we will see her back in 2 weeks for a blood pressure check. No changes made to medication regimen today.

## 2018-02-05 NOTE — Progress Notes (Signed)
Subjective:    Patient ID: Laura Ferguson, female    DOB: 1932-09-08, 85 y.o.   MRN: [id number]  HPI  Laura Ferguson is an 85 year old female who presents today for follow up of hypotension.  She phoned into our office last week with reports of hypotension, BP of 74/44 and 101/44 with HR of 74-75. I called her back later and she endorsed an improved BP reading of 138/80. She was instructed to hold metoprolol for now, resume losartan if BP became at or above 140/90.  Since her phone call last week, she's been checking her BP. BP is ranging 120's-160's/60's-70's. HR is ranging 60's-70's. She's taken her metoprolol succinate daily since Saturday last weekend, also taking Losartan 50 mg daily, will take the extra 1/2 tablet later that day as BP will "creep up".   She denies dizziness, visual changes, headaches, chest pain.  She does not drink much water during the day, maybe 3 small glasses.  BP Readings from Last 3 Encounters:  02/05/18 (!) 176/76  01/29/18 136/80  11/12/17 (!) 142/78     Review of Systems  Eyes: Negative for visual disturbance.  Respiratory: Negative for shortness of breath.   Cardiovascular: Negative for chest pain.  Neurological: Negative for dizziness and headaches.       Past Medical History:  Diagnosis Date  . CAD (coronary artery disease)   . History of nuclear stress test 11/22/2011   bruce myoview; normal pattern of perfusio; post-stress EF 76%; low risk scan  . Hyperlipidemia   . Hypertension   . 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 Type 2 diabetes mellitus ([place])      Social History   Socioeconomic History  . Marital status: Widowed    Spouse name: Not on file  . Number of children: 7  . Years of education: Not on file  . Highest education level: Not on file  Occupational History  . Not on file  Social Needs  . Financial resource strain: Not on file  . Food  insecurity:    Worry: Not on file    Inability: Not on file  . Transportation needs:    Medical: Not on file    Non-medical: Not on file  Tobacco Use  . Smoking status: Never Smoker  . Smokeless tobacco: Never Used  . Tobacco comment: quit 1960's  smoked very lightly.  Substance and Sexual Activity  . Alcohol use: No    Alcohol/week: 0.0 standard drinks  . Drug use: No  . Sexual activity: Not on file  Lifestyle  . Physical activity:    Days per week: Not on file    Minutes per session: Not on file  . Stress: Not on file  Relationships  . Social connections:    Talks on phone: Not on file    Gets together: Not on file    Attends religious service: Not on file    Active member of club or organization: Not on file    Attends meetings of clubs or organizations: Not on file    Relationship status: Not on file  . Intimate partner violence:    Fear of current or ex partner: Not on file    Emotionally abused: Not on file    Physically abused: Not on file    Forced sexual activity: Not on file  Other Topics Concern  .  Not on file  Social History Narrative   Married.   Retired.   Works as a Building control surveyor.    Enjoys helping other.     Past Surgical History:  Procedure Laterality Date  . CARDIAC CATHETERIZATION  05/07/2007   EF 55%, focal mild hypocontractility in mid-distal inferior wall & mid posterolateral wall; severe multivessel CAD - susequent CABGx6 (Dr. Corky Downs)  . CORONARY ARTERY BYPASS GRAFT  05/09/2007   LIMA to LAD, veing to intermediate; SVG to OM1 & OM2; SVG to acute marginal & distal RCA (Dr. Roxan Hockey)  . TRANSTHORACIC ECHOCARDIOGRAM  02/13/2010   EF =>55%, vigorous contraction EF 65%; LA mild-mod dilated; IV normal diameter - normal CVP; trace MR; mild TR; trace AV regurg    Family History  Problem Relation Age of Onset  . Cancer Mother   . Diabetes Sister   . Cancer Child     No Known Allergies  Current Outpatient Medications on File Prior to Visit    Medication Sig Dispense Refill  . Acetaminophen (TYLENOL 8 HOUR PO) Take 1 tablet by mouth every 8 (eight) hours as needed.     Marland Kitchen aspirin 81 MG tablet Take 81 mg by mouth daily.    . Blood Glucose Monitoring Suppl (ACCU-CHEK GUIDE) w/Device KIT 1 Device by Does not apply route daily. Use to test blood sugar daily Dx code E11.65 1 kit 1  . glucose blood (ACCU-CHEK AVIVA) test strip Use to check blood sugar 3 times daily 300 each 4  . insulin aspart (NOVOLOG FLEXPEN) 100 UNIT/ML FlexPen INJECT 10-12 UNITS INTO THE SKIN THREE TIMES DAILY WITH MEALS 15 pen 8  . Insulin Pen Needle (CAREFINE PEN NEEDLES) 32G X 4 MM MISC Use 4x a day 300 each 4  . Lancets (ACCU-CHEK SOFT TOUCH) lancets Use as instructed to test blood sugar 3 times daily 100 each 5  . LEVEMIR FLEXTOUCH 100 UNIT/ML Pen Inject 30 Units into the skin daily. 15 mL 3  . losartan (COZAAR) 50 MG tablet Take 1.5 tablets (75 mg total) by mouth daily. 135 tablet 3  . Multiple Vitamin (MULTIVITAMIN) capsule Take 1 capsule by mouth daily.    . pantoprazole (PROTONIX) 40 MG tablet TAKE 1 TABLET BY MOUTH ONCE DAILY 90 tablet 1  . Polyethyl Glycol-Propyl Glycol (SYSTANE OP) Apply 2 drops to eye 2 (two) times daily.     . rosuvastatin (CRESTOR) 10 MG tablet TAKE 1 TABLET BY MOUTH ONCE DAILY 90 tablet 2  . TRAVATAN Z 0.004 % SOLN ophthalmic solution Place 1 drop into both eyes at bedtime.    . metoprolol succinate (TOPROL-XL) 50 MG 24 hr tablet TAKE 1 TABLET BY MOUTH DAILY. TAKE WITH OR IMMEDIATELY FOLLOWING A MEAL. (Patient not taking: Reported on 02/05/2018) 90 tablet 1   No current facility-administered medications on file prior to visit.     BP (!) 176/76   Pulse 74   Temp 98.2 F (36.8 C) (Oral)   Ht 5' 4"  (1.626 m)   Wt 129 lb 8 oz (58.7 kg)   SpO2 98%   BMI 22.23 kg/m    Objective:   Physical Exam  Constitutional: She appears well-nourished.  Neck: Neck supple.  Cardiovascular: Normal rate and regular rhythm.  Respiratory: Effort  normal and breath sounds normal.  Skin: Skin is warm and dry.           Assessment & Plan:

## 2018-02-05 NOTE — Patient Instructions (Signed)
Start monitoring your blood pressure daily, around the same time of day, for the next 2-3weeks.  Ensure that you have rested for 30 minutes prior to checking your blood pressure. Record your readings and bring them to your next visit.  Resume your metoprolol succinate 50 mg and losartan 75 mg daily.   Make sure to drink 6 cups of water daily. This will keep you hydrated.   Schedule a follow up visit in 2 weeks for blood pressure check.  It was a pleasure to see you today!

## 2018-02-19 ENCOUNTER — Encounter: Payer: Self-pay | Admitting: Primary Care

## 2018-02-19 ENCOUNTER — Ambulatory Visit (INDEPENDENT_AMBULATORY_CARE_PROVIDER_SITE_OTHER): Payer: Medicare HMO | Admitting: Primary Care

## 2018-02-19 DIAGNOSIS — I10 Essential (primary) hypertension: Secondary | ICD-10-CM | POA: Diagnosis not present

## 2018-02-19 NOTE — Progress Notes (Signed)
 Subjective:    Patient ID: Laura Ferguson, female    DOB: 05/02/1932, 85 y.o.   MRN: [id number]  HPI  Laura Ferguson is an 85 year old female who presents today for follow up of hypertension.  She was last evaluated two weeks ago for reports of hypotension and hypertension. Prior to her visit last time she endorsed a reading of 74/44 followed by a gradual increase in BP since. During her last visit her blood pressure was hypertensive so she was asked to resume metoprolol succinate 50 mg and losartan 75 mg daily.  Since her last visit she's been checking her BP at home and is getting readings of 120's-160's/60's-70's. She had one reading of 102/45 which was the lowest reading. She is compliant to her losartan 75 mg and metoprolol succinate 50 mg daily. She's had some intermittent dizziness which is chronic, no increase in symptoms. No chest pain.   BP Readings from Last 3 Encounters:  02/19/18 (!) 148/76  02/05/18 (!) 176/76  01/29/18 136/80     Review of Systems  Constitutional: Negative for fatigue.  Respiratory: Negative for shortness of breath.   Cardiovascular: Negative for chest pain.  Neurological: Negative for headaches.       Intermittent dizziness, no increase in symptoms.       Past Medical History:  Diagnosis Date  . CAD (coronary artery disease)   . History of nuclear stress test 11/22/2011   bruce myoview; normal pattern of perfusio; post-stress EF 76%; low risk scan  . Hyperlipidemia   . Hypertension   . 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 Type 2 diabetes mellitus ([facility])      Social History   Socioeconomic History  . Marital status: Widowed    Spouse name: Not on file  . Number of children: 7  . Years of education: Not on file  . Highest education level: Not on file  Occupational History  . Not on file  Social Needs  . Financial resource strain: Not on file  . Food  insecurity:    Worry: Not on file    Inability: Not on file  . Transportation needs:    Medical: Not on file    Non-medical: Not on file  Tobacco Use  . Smoking status: Never Smoker  . Smokeless tobacco: Never Used  . Tobacco comment: quit 1960's  smoked very lightly.  Substance and Sexual Activity  . Alcohol use: No    Alcohol/week: 0.0 standard drinks  . Drug use: No  . Sexual activity: Not on file  Lifestyle  . Physical activity:    Days per week: Not on file    Minutes per session: Not on file  . Stress: Not on file  Relationships  . Social connections:    Talks on phone: Not on file    Gets together: Not on file    Attends religious service: Not on file    Active member of club or organization: Not on file    Attends meetings of clubs or organizations: Not on file    Relationship status: Not on file  . Intimate partner violence:    Fear of current or ex partner: Not on file    Emotionally abused: Not on file    Physically abused: Not on file    Forced sexual activity: Not on file  Other Topics Concern  .   Not on file  Social History Narrative   Married.   Retired.   Works as a Building control surveyor.    Enjoys helping other.     Past Surgical History:  Procedure Laterality Date  . CARDIAC CATHETERIZATION  05/07/2007   EF 55%, focal mild hypocontractility in mid-distal inferior wall & mid posterolateral wall; severe multivessel CAD - susequent CABGx6 (Dr. Corky Downs)  . CORONARY ARTERY BYPASS GRAFT  05/09/2007   LIMA to LAD, veing to intermediate; SVG to OM1 & OM2; SVG to acute marginal & distal RCA (Dr. Roxan Hockey)  . TRANSTHORACIC ECHOCARDIOGRAM  02/13/2010   EF =>55%, vigorous contraction EF 65%; LA mild-mod dilated; IV normal diameter - normal CVP; trace MR; mild TR; trace AV regurg    Family History  Problem Relation Age of Onset  . Cancer Mother   . Diabetes Sister   . Cancer Child     No Known Allergies  Current Outpatient Medications on File Prior to Visit    Medication Sig Dispense Refill  . Acetaminophen (TYLENOL 8 HOUR PO) Take 1 tablet by mouth every 8 (eight) hours as needed.     Marland Kitchen aspirin 81 MG tablet Take 81 mg by mouth daily.    . Blood Glucose Monitoring Suppl (ACCU-CHEK GUIDE) w/Device KIT 1 Device by Does not apply route daily. Use to test blood sugar daily Dx code E11.65 1 kit 1  . glucose blood (ACCU-CHEK AVIVA) test strip Use to check blood sugar 3 times daily 300 each 4  . insulin aspart (NOVOLOG FLEXPEN) 100 UNIT/ML FlexPen INJECT 10-12 UNITS INTO THE SKIN THREE TIMES DAILY WITH MEALS 15 pen 8  . Insulin Pen Needle (CAREFINE PEN NEEDLES) 32G X 4 MM MISC Use 4x a day 300 each 4  . Lancets (ACCU-CHEK SOFT TOUCH) lancets Use as instructed to test blood sugar 3 times daily 100 each 5  . LEVEMIR FLEXTOUCH 100 UNIT/ML Pen Inject 30 Units into the skin daily. 15 mL 3  . losartan (COZAAR) 50 MG tablet Take 1.5 tablets (75 mg total) by mouth daily. 135 tablet 3  . metoprolol succinate (TOPROL-XL) 50 MG 24 hr tablet TAKE 1 TABLET BY MOUTH DAILY. TAKE WITH OR IMMEDIATELY FOLLOWING A MEAL. 90 tablet 1  . Multiple Vitamin (MULTIVITAMIN) capsule Take 1 capsule by mouth daily.    . pantoprazole (PROTONIX) 40 MG tablet TAKE 1 TABLET BY MOUTH ONCE DAILY 90 tablet 1  . Polyethyl Glycol-Propyl Glycol (SYSTANE OP) Apply 2 drops to eye 2 (two) times daily.     . rosuvastatin (CRESTOR) 10 MG tablet TAKE 1 TABLET BY MOUTH ONCE DAILY 90 tablet 2  . TRAVATAN Z 0.004 % SOLN ophthalmic solution Place 1 drop into both eyes at bedtime.     No current facility-administered medications on file prior to visit.     BP (!) 148/76   Pulse 68   Temp 98 F (36.7 C) (Oral)   Ht 5' 4" (1.626 m)   Wt 129 lb 8 oz (58.7 kg)   SpO2 99%   BMI 22.23 kg/m    Objective:   Physical Exam  Constitutional: She is oriented to person, place, and time. She appears well-nourished.  Neck: Neck supple.  Cardiovascular: Normal rate and regular rhythm.  Respiratory: Effort  normal and breath sounds normal.  Neurological: She is alert and oriented to person, place, and time.  Skin: Skin is warm and dry.           Assessment & Plan:

## 2018-02-19 NOTE — Patient Instructions (Signed)
Continue taking losartan 75 mg once daily for blood pressure.  Continue taking metoprolol succinate 50 mg daily for blood pressure.  Please call us if you see consistently elevated blood pressures of 150/90.  Follow up with your cardiologist as scheduled.  It was a pleasure to see you today!

## 2018-02-19 NOTE — Assessment & Plan Note (Signed)
Improved today and home readings overall stable. Given history of hypotension we will continue her at her current regimen of losartan 75 mg daily and metoprolol succinate 50 mg daily. She will follow-up with cardiology in April schedule. Return precautions provided.

## 2018-02-24 ENCOUNTER — Other Ambulatory Visit: Payer: Self-pay | Admitting: Internal Medicine

## 2018-02-24 DIAGNOSIS — Z794 Long term (current) use of insulin: Principal | ICD-10-CM

## 2018-02-24 DIAGNOSIS — E119 Type 2 diabetes mellitus without complications: Secondary | ICD-10-CM

## 2018-03-21 ENCOUNTER — Other Ambulatory Visit: Payer: Self-pay | Admitting: Cardiovascular Disease

## 2018-03-21 MED ORDER — PANTOPRAZOLE SODIUM 40 MG PO TBEC: 40.0000 mg | DELAYED_RELEASE_TABLET | Freq: Every day | ORAL | 2 refills | Status: DC

## 2018-03-21 NOTE — Telephone Encounter (Signed)
New Message    *STAT* If patient is at the pharmacy, call can be transferred to refill team.   1. Which medications need to be refilled? (please list name of each medication and dose if known) Pantoprazole 40mg   2. Which pharmacy/location (including street and city if local pharmacy) is medication to be sent to? [facility] on [address]   3. Do they need a 30 day or 90 day supply? 30 or 90   Pt is out of medication and started to feel side effects

## 2018-03-21 NOTE — Telephone Encounter (Signed)
Rx(s) sent to pharmacy electronically.  

## 2018-04-10 ENCOUNTER — Other Ambulatory Visit: Payer: Self-pay | Admitting: Internal Medicine

## 2018-04-10 DIAGNOSIS — Z794 Long term (current) use of insulin: Principal | ICD-10-CM

## 2018-04-10 DIAGNOSIS — E119 Type 2 diabetes mellitus without complications: Secondary | ICD-10-CM

## 2018-04-10 MED ORDER — INSULIN ASPART 100 UNIT/ML FLEXPEN: PEN_INJECTOR | SUBCUTANEOUS | 8 refills | Status: DC

## 2018-04-10 NOTE — Telephone Encounter (Signed)
Can you re order the one marked No Print and change to normal please? Thank you!

## 2018-05-01 ENCOUNTER — Telehealth: Payer: Self-pay | Admitting: Cardiovascular Disease

## 2018-05-01 NOTE — Telephone Encounter (Signed)
LMTCB

## 2018-05-01 NOTE — Telephone Encounter (Signed)
LMTCB concerning appt scheduled with Dr. Tresa Endo on 05/05/18. If pt is doing well. We can reschedule her appt per Raynelle Fanning, LPN. If not, then need to change visit to virtual.

## 2018-05-02 ENCOUNTER — Telehealth: Payer: Self-pay | Admitting: Cardiovascular Disease

## 2018-05-02 NOTE — Telephone Encounter (Signed)
Attempted to contact patient, to advised that if she is doing well we are okay to move appointment, if needing to be seen- we can do that as virtual visit.   Left call back number.

## 2018-05-02 NOTE — Telephone Encounter (Signed)
Patient was called back, spoke about upcoming appointment on 04/20. According to Dr.Kellys last note, he was okay with seeing her in one year for follow up which would be October, patient is doing well, no issues, is okay with scheduling in October when it becomes available. Patient removed from 4/20 schedule.

## 2018-05-02 NOTE — Telephone Encounter (Signed)
F/U Message             Patient is needing a call back to verify if she is coming in April or October Patient states it was to be rescheduled and no one has changed appt time.Marland Kitchen Pls call  To verify.Marland Kitchen

## 2018-05-02 NOTE — Telephone Encounter (Signed)
F/U Message          Patient is returning Julie's call, would like a call back

## 2018-05-05 ENCOUNTER — Ambulatory Visit: Payer: Medicare HMO | Admitting: Cardiovascular Disease

## 2018-05-06 ENCOUNTER — Ambulatory Visit: Payer: Self-pay | Admitting: Internal Medicine

## 2018-06-16 ENCOUNTER — Ambulatory Visit (INDEPENDENT_AMBULATORY_CARE_PROVIDER_SITE_OTHER): Payer: Medicare HMO | Admitting: Internal Medicine

## 2018-06-16 ENCOUNTER — Other Ambulatory Visit: Payer: Self-pay

## 2018-06-16 ENCOUNTER — Encounter: Payer: Self-pay | Admitting: Internal Medicine

## 2018-06-16 VITALS — BP 118/60 | HR 74 | Ht 64.0 in | Wt 133.0 lb

## 2018-06-16 DIAGNOSIS — E1159 Type 2 diabetes mellitus with other circulatory complications: Secondary | ICD-10-CM

## 2018-06-16 DIAGNOSIS — E119 Type 2 diabetes mellitus without complications: Secondary | ICD-10-CM | POA: Diagnosis not present

## 2018-06-16 DIAGNOSIS — Z794 Long term (current) use of insulin: Secondary | ICD-10-CM

## 2018-06-16 DIAGNOSIS — E785 Hyperlipidemia, unspecified: Secondary | ICD-10-CM

## 2018-06-16 LAB — POCT GLYCOSYLATED HEMOGLOBIN (HGB A1C): Hemoglobin A1C: 8.8 % — AB (ref 4.0–5.6)

## 2018-06-16 MED ORDER — LEVEMIR FLEXTOUCH 100 UNIT/ML ~~LOC~~ SOPN: PEN_INJECTOR | SUBCUTANEOUS | 3 refills | Status: DC

## 2018-06-16 NOTE — Patient Instructions (Addendum)
Please change: - Levemir 30 units in a.m. and add 10 units at bedtime - Novolog 10-12 units before meals  Please return in 3-4 months with your sugar log.

## 2018-06-16 NOTE — Progress Notes (Signed)
Patient ID: Laura Ferguson, female   DOB: 1932-12-17, 85 y.o.   MRN: [id number]   HPI: Laura Ferguson is a 85 y.o.-year-old female, returning for f/u for DM2, dx in 1990s, insulin-dependent for years, uncontrolled, with complications (CAD - s/p CABG x6, CKD, DR, PN). Last visit 4 months ago.  Last hemoglobin A1c was: Lab Results  Component Value Date   HGBA1C 8.9 (A) 01/29/2018   HGBA1C 9.4 (A) 10/15/2017   HGBA1C 8.5 04/18/2017  10/16/2016: HbA1c 8.8%  She is now on: - Levemir 30 units in a.m. - Novolog 10-12 units before meals If you do eat at night >> may need 4-5 units of insulin before this snack.  She checks sugars 3x a day -per review of her meter download : - am: 77-283 >> 97-198, 279, 324 >> 220-300 (?) >> 156-301 - 2h after b'fast: 64, 83-189, 207, 253 >> n/c - before lunch: 84 >> 86-211, 333 >> 83-208 >> 126, 182-264 - 2h after lunch: n/c >> 64, 85-164, 236 >> n/c - before dinner: 186 >> 68-198, 257 >> n/c  >> 64, 82, 158-338 - 2h after dinner: 408 >> 110, 140 >> n/c >> 100 - bedtime: 217, 209 >> n/c - nighttime: n/c Lowest sugar was 64 >> 59 >> 64; it is unclear at which CBG level she has hypoglycemia awareness Highest sugar was 324 >> 300s >> 338 .  Glucometer: AccuChek  Pt's meals are: - Breakfast: oatmeal, grits, apple sauce - Lunch: tuna sandwich, bologna - Dinner: soup, sandwich  -+ CKD, last BUN/creatinine:  Lab Results  Component Value Date   BUN 16 10/16/2017   BUN 21 04/22/2017   CREATININE 1.35 (H) 10/16/2017   CREATININE 1.25 (H) 04/22/2017  On losartan 75 mg. -+ HL; last set of lipids: Lab Results  Component Value Date   CHOL 130 10/16/2017   HDL 68.50 10/16/2017   LDLCALC 46 10/16/2017   TRIG 79.0 10/16/2017   CHOLHDL 2 10/16/2017  On Crestor 10.  Cardiologist: Dr. Claiborne Billings.  - last eye exam was in 05/2018: + DR. + IO injections. Dr. Enis Slipper. - + numbness and tingling in her feet.  She also has HTN.  ROS: Constitutional: no weight gain/no weight  loss, no fatigue, no subjective hyperthermia, no subjective hypothermia Eyes: no blurry vision, no xerophthalmia ENT: no sore throat, no nodules palpated in neck, no dysphagia, no odynophagia, no hoarseness Cardiovascular: no CP/no SOB/no palpitations/no leg swelling Respiratory: no cough/no SOB/no wheezing Gastrointestinal: no N/no V/no D/no C/no acid reflux Musculoskeletal: no muscle aches/no joint aches Skin: no rashes, no hair loss Neurological: no tremors/+ numbness/+ tingling/no dizziness  I reviewed pt's medications, allergies, PMH, social hx, family hx, and changes were documented in the history of present illness. Otherwise, unchanged from my initial visit note.   Past Medical History:  Diagnosis Date  . CAD (coronary artery disease)   . History of nuclear stress test 11/22/2011   bruce myoview; normal pattern of perfusio; post-stress EF 76%; low risk scan  . Hyperlipidemia   . Hypertension   . 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 Type 2 diabetes mellitus ([place])    Past Surgical History:  Procedure Laterality Date  . CARDIAC CATHETERIZATION  05/07/2007   EF 55%, focal mild hypocontractility in mid-distal inferior wall & mid posterolateral wall; severe multivessel CAD - susequent CABGx6 (Dr. Corky Downs)  .  CORONARY ARTERY BYPASS GRAFT  05/09/2007   LIMA to LAD, veing to intermediate; SVG to OM1 & OM2; SVG to acute marginal & distal RCA (Dr. Roxan Hockey)  . TRANSTHORACIC ECHOCARDIOGRAM  02/13/2010   EF =>55%, vigorous contraction EF 65%; LA mild-mod dilated; IV normal diameter - normal CVP; trace MR; mild TR; trace AV regurg   Social History   Social History  . Marital status: Widowed    Spouse name: N/A  . Number of children: 8   Occupational History  . homemaker   Social History Main Topics  . Smoking status: Never Smoker  . Smokeless tobacco: Never Used     Comment: quit 1960's   smoked very lightly.  . Alcohol use No  . Drug use: No     Social History Narrative   Married.   Retired.   Works as a Building control surveyor.    Enjoys helping other.    Current Outpatient Medications on File Prior to Visit  Medication Sig Dispense Refill  . Acetaminophen (TYLENOL 8 HOUR PO) Take 1 tablet by mouth every 8 (eight) hours as needed.     Marland Kitchen aspirin 81 MG tablet Take 81 mg by mouth daily.    . Blood Glucose Monitoring Suppl (ACCU-CHEK GUIDE) w/Device KIT 1 Device by Does not apply route daily. Use to test blood sugar daily Dx code E11.65 1 kit 1  . glucose blood (ACCU-CHEK AVIVA) test strip Use to check blood sugar 3 times daily 300 each 4  . insulin aspart (NOVOLOG FLEXPEN) 100 UNIT/ML FlexPen INJECT 10-12 UNITS INTO THE SKIN THREE TIMES DAILY WITH MEALS 15 pen 8  . Insulin Pen Needle (CAREFINE PEN NEEDLES) 32G X 4 MM MISC Use 4x a day 300 each 4  . Lancets (ACCU-CHEK SOFT TOUCH) lancets Use as instructed to test blood sugar 3 times daily 100 each 5  . LEVEMIR FLEXTOUCH 100 UNIT/ML Pen INJECT 30 UNITS SUBCUTANEOUSLY ONCE DAILY 15 mL 0  . losartan (COZAAR) 50 MG tablet Take 1.5 tablets (75 mg total) by mouth daily. 135 tablet 3  . metoprolol succinate (TOPROL-XL) 50 MG 24 hr tablet TAKE 1 TABLET BY MOUTH DAILY. TAKE WITH OR IMMEDIATELY FOLLOWING A MEAL. 90 tablet 1  . Multiple Vitamin (MULTIVITAMIN) capsule Take 1 capsule by mouth daily.    . pantoprazole (PROTONIX) 40 MG tablet Take 1 tablet (40 mg total) by mouth daily. 90 tablet 2  . Polyethyl Glycol-Propyl Glycol (SYSTANE OP) Apply 2 drops to eye 2 (two) times daily.     . rosuvastatin (CRESTOR) 10 MG tablet TAKE 1 TABLET BY MOUTH ONCE DAILY 90 tablet 2  . TRAVATAN Z 0.004 % SOLN ophthalmic solution Place 1 drop into both eyes at bedtime.     No current facility-administered medications on file prior to visit.    No Known Allergies Family History  Problem Relation Age of Onset  . Cancer Mother   . Diabetes Sister   . Cancer  Child    Pt has FH of DM in [place], sister - died from DM complications (refused to start HD), sons - 1 son died from DM complications.  Her youngest son died 03/06/17 (cancer). He was 53.  PE: There were no vitals taken for this visit. Wt Readings from Last 3 Encounters:  02/19/18 129 lb 8 oz (58.7 kg)  02/05/18 129 lb 8 oz (58.7 kg)  01/29/18 130 lb (59 kg)   Constitutional: normal weight, in NAD Eyes: PERRLA, EOMI, no exophthalmos ENT: moist mucous membranes, no  thyromegaly, no cervical lymphadenopathy Cardiovascular: RRR, No MRG Respiratory: CTA B Gastrointestinal: abdomen soft, NT, ND, BS+ Musculoskeletal: no deformities, strength intact in all 4 Skin: moist, warm, no rashes Neurological: no tremor with outstretched hands, DTR normal in all 4  ASSESSMENT: 1. DM2, insulin-dependent, uncontrolled, with complications - CAD - s/p CABG x6 - CKD - DR - PN  2. HL  PLAN:  1. Patient with longstanding, uncontrolled, type 2 diabetes, on basal-bolus insulin regimen, with deterioration of diabetes control after the death of her son.  She was not checking sugars due to stress and grief.  She started to drink and sugar afterwards and sugars increased even further.  I advised her to stop this and we also had to increase her NovoLog doses.  At last visit we discussed about the importance of stopping eating at night however, if she had a snack, to bolus several units of insulin for this.  At last visit, however, she did not bring her sugar log or meter and she could not remember her sugars and her insulin doses.  It was also unclear consistently.  I strongly advised her to bring a meter or her log at this visit. -At this visit, sugars are still high, not much improved from before.  They are also quite variable.  She has an occasional low blood sugar in the 60s, but unclear if this is after activity at work as a consequence of injecting too much insulin for a meal.  Since almost all of the sugars are  above target (with the exceptions above), and says sugars in the morning are quite high, we discussed about adding a low dose of Levemir at bedtime.  We tried to do this in the past, but she did not start it.  She agrees to start it now. - I suggested to:  Patient Instructions  Please change: - Levemir 30 units in a.m. and add 10 units at bedtime - Novolog 10-12 units before meals  Please return in 3-4 months with your sugar log.   - today, HbA1c is 8.8% (approximately the same as before) - continue checking sugars at different times of the day - check 3x a day, rotating checks - advised for yearly eye exams >> she is UTD - Return to clinic in 3-4 mo with sugar log    2. HL - Reviewed latest lipid panel from 10/2017: All fractions at goal Lab Results  Component Value Date   CHOL 130 10/16/2017   HDL 68.50 10/16/2017   LDLCALC 46 10/16/2017   TRIG 79.0 10/16/2017   CHOLHDL 2 10/16/2017  - Continues Crestor without side effects.  Philemon Kingdom, MD PhD [facility] Endocrin[facility]

## 2018-07-06 ENCOUNTER — Other Ambulatory Visit: Payer: Self-pay | Admitting: Internal Medicine

## 2018-07-31 ENCOUNTER — Other Ambulatory Visit: Payer: Self-pay | Admitting: Cardiovascular Disease

## 2018-07-31 ENCOUNTER — Other Ambulatory Visit: Payer: Self-pay | Admitting: Internal Medicine

## 2018-07-31 MED ORDER — METOPROLOL SUCCINATE ER 50 MG PO TB24: ORAL_TABLET | ORAL | 0 refills | Status: DC

## 2018-07-31 NOTE — Telephone Encounter (Signed)
New Message     *STAT* If patient is at the pharmacy, call can be transferred to refill team.   1. Which medications need to be refilled? (please list name of each medication and dose if known) Metoprolol  2. Which pharmacy/location (including street and city if local pharmacy) is medication to be sent to? [facility] in [address]  3. Do they need a 30 day or 90 day supply? 90 day supply

## 2018-07-31 NOTE — Addendum Note (Signed)
Addended by: Kathyrn Lass on: 07/31/2018 05:11 PM   Modules accepted: Orders

## 2018-09-01 ENCOUNTER — Other Ambulatory Visit: Payer: Self-pay

## 2018-09-01 ENCOUNTER — Encounter: Payer: Self-pay | Admitting: Primary Care

## 2018-09-01 ENCOUNTER — Ambulatory Visit (INDEPENDENT_AMBULATORY_CARE_PROVIDER_SITE_OTHER): Payer: Medicare HMO | Admitting: Primary Care

## 2018-09-01 DIAGNOSIS — R42 Dizziness and giddiness: Secondary | ICD-10-CM

## 2018-09-01 DIAGNOSIS — E785 Hyperlipidemia, unspecified: Secondary | ICD-10-CM | POA: Diagnosis not present

## 2018-09-01 DIAGNOSIS — I2510 Atherosclerotic heart disease of native coronary artery without angina pectoris: Secondary | ICD-10-CM | POA: Diagnosis not present

## 2018-09-01 DIAGNOSIS — I10 Essential (primary) hypertension: Secondary | ICD-10-CM | POA: Diagnosis not present

## 2018-09-01 DIAGNOSIS — E1159 Type 2 diabetes mellitus with other circulatory complications: Secondary | ICD-10-CM | POA: Diagnosis not present

## 2018-09-01 DIAGNOSIS — Z794 Long term (current) use of insulin: Secondary | ICD-10-CM

## 2018-09-01 LAB — LIPID PANEL
Cholesterol: 139 mg/dL (ref 0–200)
HDL: 72.3 mg/dL (ref >39.00)
LDL Cholesterol: 36 mg/dL (ref 0–99)
NonHDL: 66.27
Total CHOL/HDL Ratio: 2
Triglycerides: 152 mg/dL — ABNORMAL HIGH (ref 0.0–149.0)
VLDL: 30.4 mg/dL (ref 0.0–40.0)

## 2018-09-01 LAB — COMPREHENSIVE METABOLIC PANEL
ALT: 18 U/L (ref 0–35)
AST: 18 U/L (ref 0–37)
Albumin: 4.1 g/dL (ref 3.5–5.2)
Alkaline Phosphatase: 85 U/L (ref 39–117)
BUN: 23 mg/dL (ref 6–23)
CO2: 27 mEq/L (ref 19–32)
Calcium: 9.6 mg/dL (ref 8.4–10.5)
Chloride: 105 mEq/L (ref 96–112)
Creatinine, Ser: 1.5 mg/dL — ABNORMAL HIGH (ref 0.40–1.20)
GFR: 40.01 mL/min — ABNORMAL LOW (ref >60.00)
Glucose, Bld: 183 mg/dL — ABNORMAL HIGH (ref 70–99)
Potassium: 4.1 mEq/L (ref 3.5–5.1)
Sodium: 140 mEq/L (ref 135–145)
Total Bilirubin: 0.4 mg/dL (ref 0.2–1.2)
Total Protein: 7.3 g/dL (ref 6.0–8.3)

## 2018-09-01 NOTE — Patient Instructions (Addendum)
Stop by the lab prior to leaving today. I will notify you of your results once received.   Try reducing your Novolog to 8 units to prevent low blood sugars.  Monitor your blood pressure and notify me if you see readings that are consistently at or above 130/80.  Follow up with your endocrinologist and heart doctor as scheduled.  It was a pleasure to see you today!

## 2018-09-01 NOTE — Assessment & Plan Note (Signed)
Above goal in the office today, endorses improved home readings. Discussed to monitor home BP at report readings that are consistent at or above 130/80. Continue losartan and metoprolol succinate.

## 2018-09-01 NOTE — Assessment & Plan Note (Signed)
Following with endocrinology. Discussed to try decreasing Novolog to 8 units to prevent hypoglycemia.  Continue to monitor glucose. Managed on statin and ARB.

## 2018-09-01 NOTE — Assessment & Plan Note (Signed)
Chronic, seem overall stable. No acute changes.  Exam today benign.

## 2018-09-01 NOTE — Progress Notes (Signed)
Subjective:    Patient ID: Laura Ferguson, female    DOB: March 20, 1932, 85 y.o.   MRN: [id number]  HPI  Laura Ferguson is an 85 year old female who presents today for follow up.  1) Type 2 Diabetes: Currently following with endocrinology and is managed on Levemir 30 units in the morning and 10 units in the evening, Novolog 10-12 units TID with meals. Her last A1C was 8.8 in June 2020.   She is not taking her Levemir 10 units at night as she's afraid it will "drop my sugars". She is compliant to Novolog 12 units with breakfast, sometimes doesn't take her lunch dose, will take her dinner time Novolog dose most of the time.   She is checking her blood sugars several times daily. This morning fasting was 174. Highest reading lately was 340, took 12 units of Novolog which decreased her reading down to 59. She then ate which brought her glucose back up.  2) Essential Hypertension: Currently managed on losartan 50 mg, metoprolol succinate 50 mg daily. She checks her BP at home which is running 138/70, 128/74. She denies chest pain. Has noticed an occasional sharp pain from the left posterior neck which will radiate to the left occipital lobe. Takes Tylenol with improvement.   BP Readings from Last 3 Encounters:  09/01/18 (!) 146/72  06/16/18 118/60  02/19/18 (!) 148/76    3) Hyperlipidemia/CAD: History of CABG in 2009. Following with cardiology. Managed on rosuvastatin 10 mg, metoprolol succinate. Last lipid panel in October 2019 with LDL of 46. She is due for follow up in November this year, denies chest pain.  Review of Systems  Eyes: Negative for visual disturbance.  Respiratory: Negative for shortness of breath.   Cardiovascular: Negative for chest pain.  Neurological: Positive for headaches.       Intermittent vertigo, chronic.       Past Medical History:  Diagnosis Date  . CAD (coronary artery disease)   . History of nuclear stress test 11/22/2011   bruce myoview; normal pattern of  perfusio; post-stress EF 76%; low risk scan  . Hyperlipidemia   . Hypertension   . 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 Type 2 diabetes mellitus ([place])      Social History   Socioeconomic History  . Marital status: Widowed    Spouse name: Not on file  . Number of children: 7  . Years of education: Not on file  . Highest education level: Not on file  Occupational History  . Not on file  Social Needs  . Financial resource strain: Not on file  . Food insecurity    Worry: Not on file    Inability: Not on file  . Transportation needs    Medical: Not on file    Non-medical: Not on file  Tobacco Use  . Smoking status: Never Smoker  . Smokeless tobacco: Never Used  . Tobacco comment: quit 1960's  smoked very lightly.  Substance and Sexual Activity  . Alcohol use: No    Alcohol/week: 0.0 standard drinks  . Drug use: No  . Sexual activity: Not on file  Lifestyle  . Physical activity    Days per week: Not on file    Minutes per session: Not on file  . Stress: Not on file  Relationships  . Social connections    Talks  on phone: Not on file    Gets together: Not on file    Attends religious service: Not on file    Active member of club or organization: Not on file    Attends meetings of clubs or organizations: Not on file    Relationship status: Not on file  . Intimate partner violence    Fear of current or ex partner: Not on file    Emotionally abused: Not on file    Physically abused: Not on file    Forced sexual activity: Not on file  Other Topics Concern  . Not on file  Social History Narrative   Married.   Retired.   Works as a Caregiver.    Enjoys helping other.     Past Surgical History:  Procedure Laterality Date  . CARDIAC CATHETERIZATION  05/07/2007   EF 55%, focal mild hypocontractility in mid-distal inferior wall & mid posterolateral wall; severe multivessel CAD -  susequent CABGx6 (Dr. T. Kelly)  . CORONARY ARTERY BYPASS GRAFT  05/09/2007   LIMA to LAD, veing to intermediate; SVG to OM1 & OM2; SVG to acute marginal & distal RCA (Dr. Hendrickson)  . TRANSTHORACIC ECHOCARDIOGRAM  02/13/2010   EF =>55%, vigorous contraction EF 65%; LA mild-mod dilated; IV normal diameter - normal CVP; trace MR; mild TR; trace AV regurg    Family History  Problem Relation Age of Onset  . Cancer Mother   . Diabetes Sister   . Cancer Child     No Known Allergies  Current Outpatient Medications on File Prior to Visit  Medication Sig Dispense Refill  . ACCU-CHEK GUIDE test strip USE 1 STRIP THREE TIMES DAILY TO TEST BLOOD SUGAR 100 each 11  . Acetaminophen (TYLENOL 8 HOUR PO) Take 1 tablet by mouth every 8 (eight) hours as needed.     . aspirin 81 MG tablet Take 81 mg by mouth daily.    . Blood Glucose Monitoring Suppl (ACCU-CHEK GUIDE) w/Device KIT 1 Device by Does not apply route daily. Use to test blood sugar daily Dx code E11.65 1 kit 1  . insulin aspart (NOVOLOG FLEXPEN) 100 UNIT/ML FlexPen INJECT 10-12 UNITS INTO THE SKIN THREE TIMES DAILY WITH MEALS 15 pen 8  . Insulin Pen Needle (CAREFINE PEN NEEDLES) 32G X 4 MM MISC Use 4x a day 300 each 4  . Lancets (ACCU-CHEK SOFT TOUCH) lancets Use as instructed to test blood sugar 3 times daily 100 each 5  . LEVEMIR FLEXTOUCH 100 UNIT/ML Pen INJECT 30 UNITS SUBCUTANEOUSLY IN AM and 10 UNITS at bedtime 30 mL 3  . losartan (COZAAR) 50 MG tablet Take 1.5 tablets (75 mg total) by mouth daily. 135 tablet 3  . metoprolol succinate (TOPROL-XL) 50 MG 24 hr tablet TAKE 1 TABLET BY MOUTH ONCE DAILY (TAKE  WITH  OR  IMMEDIATELY  FOLLOWING  A  MEAL) 90 tablet 0  . Multiple Vitamin (MULTIVITAMIN) capsule Take 1 capsule by mouth daily.    . pantoprazole (PROTONIX) 40 MG tablet Take 1 tablet (40 mg total) by mouth daily. 90 tablet 2  . Polyethyl Glycol-Propyl Glycol (SYSTANE OP) Apply 2 drops to eye 2 (two) times daily.     . rosuvastatin  (CRESTOR) 10 MG tablet TAKE 1 TABLET BY MOUTH ONCE DAILY 90 tablet 2  . TRAVATAN Z 0.004 % SOLN ophthalmic solution Place 1 drop into both eyes at bedtime.     No current facility-administered medications on file prior to visit.     BP (!)   146/72   Pulse 72   Temp 97.7 F (36.5 C) (Temporal)   Ht 5' 4" (1.626 m)   Wt 133 lb 8 oz (60.6 kg)   SpO2 98%   BMI 22.92 kg/m    Objective:   Physical Exam  Constitutional: She appears well-nourished.  Neck: Neck supple.  Cardiovascular: Normal rate and regular rhythm.  Respiratory: Effort normal and breath sounds normal.  Skin: Skin is warm and dry.  Psychiatric: She has a normal mood and affect.           Assessment & Plan:   

## 2018-09-01 NOTE — Assessment & Plan Note (Signed)
Repeat lipids pending, goal LDL of less than 70. Continue rosuvastatin.

## 2018-09-01 NOTE — Assessment & Plan Note (Signed)
Asymptomatic. Following with cardiology. LDL from labs in 2019 well within range. Continue rosuvastatin and beta blocker. Discussed to continue to monitor home BP readings and report readings that are consistently at or below 130/80.

## 2018-10-03 ENCOUNTER — Other Ambulatory Visit: Payer: Self-pay | Admitting: Cardiovascular Disease

## 2018-10-03 DIAGNOSIS — E785 Hyperlipidemia, unspecified: Secondary | ICD-10-CM

## 2018-10-03 NOTE — Telephone Encounter (Signed)
New message      *STAT* If patient is at the pharmacy, call can be transferred to refill team.   1. Which medications need to be refilled? (please list name of each medication and dose if known) rosuvastatin (CRESTOR) 10 MG tablet  2. Which pharmacy/location (including street and city if local pharmacy) is medication to be sent to?[place], [place]  3. Do they need a 30 day or 90 day supply? [place]

## 2018-10-14 ENCOUNTER — Other Ambulatory Visit: Payer: Self-pay

## 2018-10-16 ENCOUNTER — Encounter: Payer: Self-pay | Admitting: Internal Medicine

## 2018-10-16 ENCOUNTER — Other Ambulatory Visit: Payer: Self-pay

## 2018-10-16 ENCOUNTER — Ambulatory Visit (INDEPENDENT_AMBULATORY_CARE_PROVIDER_SITE_OTHER): Payer: Medicare HMO | Admitting: Internal Medicine

## 2018-10-16 VITALS — BP 160/100 | HR 107 | Ht 63.0 in | Wt 134.0 lb

## 2018-10-16 DIAGNOSIS — Z23 Encounter for immunization: Secondary | ICD-10-CM

## 2018-10-16 DIAGNOSIS — E785 Hyperlipidemia, unspecified: Secondary | ICD-10-CM

## 2018-10-16 DIAGNOSIS — E119 Type 2 diabetes mellitus without complications: Secondary | ICD-10-CM

## 2018-10-16 DIAGNOSIS — E1159 Type 2 diabetes mellitus with other circulatory complications: Secondary | ICD-10-CM

## 2018-10-16 DIAGNOSIS — Z794 Long term (current) use of insulin: Secondary | ICD-10-CM

## 2018-10-16 LAB — POCT GLYCOSYLATED HEMOGLOBIN (HGB A1C): Hemoglobin A1C: 8.4 % — AB (ref 4.0–5.6)

## 2018-10-16 MED ORDER — LEVEMIR FLEXTOUCH 100 UNIT/ML ~~LOC~~ SOPN: PEN_INJECTOR | SUBCUTANEOUS | 3 refills | Status: DC

## 2018-10-16 NOTE — Patient Instructions (Addendum)
Please change: - Levemir 30 units in a.m. and 15 units at bedtime  Increase: - Novolog 10-12 units 15 min before meals  Please return in 3-4 months with your sugar log.

## 2018-10-16 NOTE — Addendum Note (Signed)
Addended by: Cardell Peach I on: 10/16/2018 11:40 AM   Modules accepted: Orders

## 2018-10-16 NOTE — Progress Notes (Signed)
Patient ID: [name], female   DOB: 02-Jan-1933, 85 y.o.   MRN: [id number]   HPI: Laura Ferguson is a 85 y.o.-year-old female, returning for f/u for DM2, dx in 1990s, insulin-dependent for years, uncontrolled, with complications (CAD - s/p CABG x6, CKD, DR, PN). Last visit 4 months ago.  Last hemoglobin A1c was: Lab Results  Component Value Date   HGBA1C 8.8 (A) 06/16/2018   HGBA1C 8.9 (A) 01/29/2018   HGBA1C 9.4 (A) 10/15/2017  10/16/2016: HbA1c 8.8%  She is now on: - Levemir 30 units in a.m. and 10 units at bedtime - Novolog 6-8, 10units before meals - may skip lunch, in which case she skips the insulin midday, also In the past, I advised her to add 4 to 5 units of NovoLog if she had a snack at night, but she did not do so.  She checks sugars 3 times a day per review of her meter download: - am: 220-300 (?) >> 156-301 >> 129, 147-306, 451 - 2h after b'fast: 64, 83-189, 207, 253 >> n/c - before lunch: 83-208 >> 126, 182-264 >> 77, 81, 163-287, 371 - 2h after lunch: n/c >> 64, 85-164, 236 >> 185 - before dinner: 68-198, 257 >> n/c  >> 64, 82, 158-338 >> 106, 281-356 - 2h after dinner: 408 >> 110, 140 >> n/c >> 100 >> 149-321 - bedtime: 217, 209 >> n/c - nighttime: n/c >> 136-157 Lowest sugar was 64 >> 56; it is unclear at which CBG level she has hypoglycemia awareness. Highest sugar was 338 >> 451.  Glucometer: AccuChek  Pt's meals are: - Breakfast: oatmeal, grits, apple sauce - Lunch: tuna sandwich, bologna - Dinner: soup, sandwich  -+ CKD, last BUN/creatinine:  Lab Results  Component Value Date   BUN 23 09/01/2018   BUN 16 10/16/2017   CREATININE 1.50 (H) 09/01/2018   CREATININE 1.35 (H) 10/16/2017  On losartan 75. -+ HL; last set of lipids: Lab Results  Component Value Date   CHOL 139 09/01/2018   HDL 72.30 09/01/2018   LDLCALC 36 09/01/2018   TRIG 152.0 (H) 09/01/2018   CHOLHDL 2 09/01/2018  On Crestor 10.  Cardiologist: Dr. Claiborne [place].  - last eye exam was in  05/2018: + DR, + intraocular injections. Dr. Enis Slipper. - she has numbness and tingling in her feet.  She also has HTN.  ROS: Constitutional: no weight gain/no weight loss, no fatigue, no subjective hyperthermia, no subjective hypothermia Eyes: no blurry vision, no xerophthalmia ENT: no sore throat, no nodules palpated in neck, no dysphagia, no odynophagia, no hoarseness Cardiovascular: no CP/no SOB/no palpitations/no leg swelling Respiratory: no cough/no SOB/no wheezing Gastrointestinal: no N/no V/no D/no C/no acid reflux Musculoskeletal: no muscle aches/no joint aches Skin: no rashes, no hair loss Neurological: no tremors/+ numbness/+ tingling/no dizziness  I reviewed pt's medications, allergies, PMH, social hx, family hx, and changes were documented in the history of present illness. Otherwise, unchanged from my initial visit note.   Past Medical History:  Diagnosis Date  . CAD (coronary artery disease)   . History of nuclear stress test 11/22/2011   bruce myoview; normal pattern of perfusio; post-stress EF 76%; low risk scan  . Hyperlipidemia   . Hypertension   . 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 Type 2 diabetes mellitus ([place])    Past Surgical History:  Procedure Laterality Date  .  CARDIAC CATHETERIZATION  05/07/2007   EF 55%, focal mild hypocontractility in mid-distal inferior wall & mid posterolateral wall; severe multivessel CAD - susequent CABGx6 (Dr. Corky Downs)  . CORONARY ARTERY BYPASS GRAFT  05/09/2007   LIMA to LAD, veing to intermediate; SVG to OM1 & OM2; SVG to acute marginal & distal RCA (Dr. Roxan Hockey)  . TRANSTHORACIC ECHOCARDIOGRAM  02/13/2010   EF =>55%, vigorous contraction EF 65%; LA mild-mod dilated; IV normal diameter - normal CVP; trace MR; mild TR; trace AV regurg   Social History   Social History  . Marital status: Widowed    Spouse name: N/A  . Number of children: 8    Occupational History  . homemaker   Social History Main Topics  . Smoking status: Never Smoker  . Smokeless tobacco: Never Used     Comment: quit 1960's  smoked very lightly.  . Alcohol use No  . Drug use: No     Social History Narrative   Married.   Retired.   Works as a Building control surveyor.    Enjoys helping other.    Current Outpatient Medications on File Prior to Visit  Medication Sig Dispense Refill  . ACCU-CHEK GUIDE test strip USE 1 STRIP THREE TIMES DAILY TO TEST BLOOD SUGAR 100 each 11  . Acetaminophen (TYLENOL 8 HOUR PO) Take 1 tablet by mouth every 8 (eight) hours as needed.     Marland Kitchen aspirin 81 MG tablet Take 81 mg by mouth daily.    . Blood Glucose Monitoring Suppl (ACCU-CHEK GUIDE) w/Device KIT 1 Device by Does not apply route daily. Use to test blood sugar daily Dx code E11.65 1 kit 1  . insulin aspart (NOVOLOG FLEXPEN) 100 UNIT/ML FlexPen INJECT 10-12 UNITS INTO THE SKIN THREE TIMES DAILY WITH MEALS 15 pen 8  . Insulin Pen Needle (CAREFINE PEN NEEDLES) 32G X 4 MM MISC Use 4x a day 300 each 4  . Lancets (ACCU-CHEK SOFT TOUCH) lancets Use as instructed to test blood sugar 3 times daily 100 each 5  . LEVEMIR FLEXTOUCH 100 UNIT/ML Pen INJECT 30 UNITS SUBCUTANEOUSLY IN AM and 10 UNITS at bedtime 30 mL 3  . losartan (COZAAR) 50 MG tablet Take 1.5 tablets (75 mg total) by mouth daily. 135 tablet 3  . metoprolol succinate (TOPROL-XL) 50 MG 24 hr tablet TAKE 1 TABLET BY MOUTH ONCE DAILY (TAKE  WITH  OR  IMMEDIATELY  FOLLOWING  A  MEAL) 90 tablet 0  . Multiple Vitamin (MULTIVITAMIN) capsule Take 1 capsule by mouth daily.    . pantoprazole (PROTONIX) 40 MG tablet Take 1 tablet (40 mg total) by mouth daily. 90 tablet 2  . Polyethyl Glycol-Propyl Glycol (SYSTANE OP) Apply 2 drops to eye 2 (two) times daily.     . rosuvastatin (CRESTOR) 10 MG tablet Take 1 tablet by mouth once daily 90 tablet 0  . TRAVATAN Z 0.004 % SOLN ophthalmic solution Place 1 drop into both eyes at bedtime.     No  current facility-administered medications on file prior to visit.    No Known Allergies Family History  Problem Relation Age of Onset  . Cancer Mother   . Diabetes Sister   . Cancer Child    Pt has FH of DM in [place], sister - died from DM complications (refused to start HD), sons - 1 son died from DM complications.  Her youngest son died Feb 02, 2017 (cancer). He was 53.  PE: There were no vitals taken for this visit. Wt Readings from  Last 3 Encounters:  09/01/18 133 lb 8 oz (60.6 kg)  06/16/18 133 lb (60.3 kg)  02/19/18 129 lb 8 oz (58.7 kg)   Constitutional: normal weight, in NAD Eyes: PERRLA, EOMI, no exophthalmos ENT: moist mucous membranes, no thyromegaly, no cervical lymphadenopathy Cardiovascular: RRR, No MRG Respiratory: CTA B Gastrointestinal: abdomen soft, NT, ND, BS+ Musculoskeletal: no deformities, strength intact in all 4 Skin: moist, warm, no rashes Neurological: no tremor with outstretched hands, DTR normal in all 4  ASSESSMENT: 1. DM2, insulin-dependent, uncontrolled, with complications - CAD - s/p CABG x6 - CKD - DR - PN  2. HL  PLAN:  1. Patient with longstanding, uncontrolled, type 2 diabetes, on basal/bolus insulin regimen, with deterioration of diabetes control after the death of her son.  She was not checking sugars due to stress and grief.  She also started to drink sugary drinks.  We discussed about stopping these and we increased his NovoLog dosage.  At last visit, sugars were still high, not much improved and still quite variable.  She had occasional low blood sugars in the 60s.  Morning sugars were also high so I advised her to add a 10 units dose of Levemir at bedtime. -At this visit, she tells me that she is taking Levemir at bedtime most of the time, but not eating when the sugars are lower.  Subsequently, sugars in the morning are quite high and I suspect that she may be missing the Levemir at night more frequently.  She is also not using the recommended  dose of NovoLog, but only 6 to 8 units per meal.  We discussed that her sugars are high throughout the day, with 3 exceptions, so I advised her to use higher doses of NovoLog with meals, even though she feels that she is eating a small meal.  Her sugars before dinner are very high, and I suspect that this is due to missing NovoLog with lunch.  She tells me that she is taking it when she eats lunch but may skip lunch frequently.  However, she is evasive in how she takes her insulin so I do suspect that she is missing more doses than she remembers.  For now, I advised her to increase the dose of NovoLog and also use a slightly higher dose of Levemir at bedtime but take it consistently.  She had one low blood sugar at night, but not in the last month and a half, per review of her meter downloads. - I suggested to:  Patient Instructions  Please change: - Levemir 30 units in a.m. and 15 units at bedtime  Increase: - Novolog 10-12 units 15 min before meals  Please return in 3-4 months with your sugar log.   - we checked her HbA1c: 8.4% (slightly better) - advised to check sugars at different times of the day - 3-4x a day, rotating check times - advised for yearly eye exams >> she is UTD - + Flu shot today - return to clinic in 3-4 months    2. HL - Reviewed latest lipid panel from 08/2018: LDL at goal, HDL excellent, triglycerides almost at goal Lab Results  Component Value Date   CHOL 139 09/01/2018   HDL 72.30 09/01/2018   LDLCALC 36 09/01/2018   TRIG 152.0 (H) 09/01/2018   CHOLHDL 2 09/01/2018  - Continues Crestor without side effects.  - time spent with the patient: 25 minutes, of which >50% was spent in obtaining information about her diabetes, reviewing her previous  labs, evaluations, and insulin doses, counseling her about modifying the doses of insulin, went to take this correctly, and also discussed about taking insulin when she has a snack at night (please also see the discussed  topics above), and developing a plan to further investigate and treat her endocrine conditions.  Philemon Kingdom, MD PhD [facility] Endocrinology

## 2018-10-20 ENCOUNTER — Other Ambulatory Visit: Payer: Self-pay | Admitting: Cardiovascular Disease

## 2018-10-20 DIAGNOSIS — I10 Essential (primary) hypertension: Secondary | ICD-10-CM

## 2018-11-05 ENCOUNTER — Emergency Department (HOSPITAL_COMMUNITY)
Admission: EM | Admit: 2018-11-05 | Discharge: 2018-11-06 | Disposition: A | Discharge status: Home / Self Care | Payer: Medicare HMO | Attending: Emergency Medicine | Admitting: Emergency Medicine

## 2018-11-05 ENCOUNTER — Telehealth: Payer: Self-pay

## 2018-11-05 ENCOUNTER — Other Ambulatory Visit: Payer: Self-pay

## 2018-11-05 ENCOUNTER — Encounter (HOSPITAL_COMMUNITY): Payer: Self-pay

## 2018-11-05 DIAGNOSIS — Z794 Long term (current) use of insulin: Secondary | ICD-10-CM | POA: Diagnosis not present

## 2018-11-05 DIAGNOSIS — I10 Essential (primary) hypertension: Secondary | ICD-10-CM | POA: Insufficient documentation

## 2018-11-05 DIAGNOSIS — Z951 Presence of aortocoronary bypass graft: Secondary | ICD-10-CM | POA: Insufficient documentation

## 2018-11-05 DIAGNOSIS — I2510 Atherosclerotic heart disease of native coronary artery without angina pectoris: Secondary | ICD-10-CM | POA: Insufficient documentation

## 2018-11-05 DIAGNOSIS — Z7982 Long term (current) use of aspirin: Secondary | ICD-10-CM | POA: Diagnosis not present

## 2018-11-05 DIAGNOSIS — N3000 Acute cystitis without hematuria: Secondary | ICD-10-CM | POA: Diagnosis not present

## 2018-11-05 DIAGNOSIS — R531 Weakness: Secondary | ICD-10-CM | POA: Diagnosis not present

## 2018-11-05 DIAGNOSIS — R519 Headache, unspecified: Secondary | ICD-10-CM | POA: Diagnosis not present

## 2018-11-05 DIAGNOSIS — E119 Type 2 diabetes mellitus without complications: Secondary | ICD-10-CM | POA: Insufficient documentation

## 2018-11-05 DIAGNOSIS — Z79899 Other long term (current) drug therapy: Secondary | ICD-10-CM | POA: Insufficient documentation

## 2018-11-05 LAB — CBC
HCT: 36.9 % (ref 36.0–46.0)
Hemoglobin: 12.2 g/dL (ref 12.0–15.0)
MCH: 30.9 pg (ref 26.0–34.0)
MCHC: 33.1 g/dL (ref 30.0–36.0)
MCV: 93.4 fL (ref 80.0–100.0)
Platelets: 166 10*3/uL (ref 150–400)
RBC: 3.95 MIL/uL (ref 3.87–5.11)
RDW: 13.2 % (ref 11.5–15.5)
WBC: 4.9 10*3/uL (ref 4.0–10.5)
nRBC: 0 % (ref 0.0–0.2)

## 2018-11-05 LAB — URINALYSIS, ROUTINE W REFLEX MICROSCOPIC
Bilirubin Urine: NEGATIVE
Glucose, UA: NEGATIVE mg/dL
Hgb urine dipstick: NEGATIVE
Ketones, ur: NEGATIVE mg/dL
Nitrite: NEGATIVE
Protein, ur: NEGATIVE mg/dL
Specific Gravity, Urine: 1.008 (ref 1.005–1.030)
WBC, UA: >50 WBC/hpf — ABNORMAL HIGH (ref 0–5)
pH: 5 (ref 5.0–8.0)

## 2018-11-05 LAB — CBG MONITORING, ED: Glucose-Capillary: 148 mg/dL — ABNORMAL HIGH (ref 70–99)

## 2018-11-05 LAB — BASIC METABOLIC PANEL
Anion gap: 11 (ref 5–15)
BUN: 18 mg/dL (ref 8–23)
CO2: 24 mmol/L (ref 22–32)
Calcium: 9.5 mg/dL (ref 8.9–10.3)
Chloride: 105 mmol/L (ref 98–111)
Creatinine, Ser: 1.47 mg/dL — ABNORMAL HIGH (ref 0.44–1.00)
GFR calc Af Amer: 38 mL/min — ABNORMAL LOW (ref >60)
GFR calc non Af Amer: 32 mL/min — ABNORMAL LOW (ref >60)
Glucose, Bld: 149 mg/dL — ABNORMAL HIGH (ref 70–99)
Potassium: 4.1 mmol/L (ref 3.5–5.1)
Sodium: 140 mmol/L (ref 135–145)

## 2018-11-05 MED ORDER — SODIUM CHLORIDE 0.9% FLUSH: 3.0000 mL | Freq: Once | INTRAVENOUS | Status: DC

## 2018-11-05 NOTE — ED Notes (Signed)
Sent a urine culture with the urine specimen 

## 2018-11-05 NOTE — Telephone Encounter (Signed)
Pt has had generalized weakness that comes and goes for 6 - 12 months. Pt has new symptom of weakness and numbness in rt leg that has worsened. Pt has arthritis in her back but that has been for a long time. Pt had a flu shot on 10/16/18 and that was pts first flu shot and she did not know if that would cause general weakness or not. Pt is talking slowly but pt said that is not new. I do not know pt and so I am not sure if she usually talks very slow like this or not. Pt has slight h/a in her forehead. And pt said she is seeing an eye doctor. No CP and pt hesitated to answer there was no SOB but she did say she was not having SOB. There is someone with pt now and pt will go to [facility] ED for eval and testing if needed. FYI to Gentry Fitz NP.

## 2018-11-05 NOTE — ED Triage Notes (Signed)
Pt reports generalized weakness for "a while now", denies pain, n/v. Pt a.o, nad noted.

## 2018-11-06 MED ORDER — CEPHALEXIN 500 MG PO CAPS: 500.0000 mg | ORAL_CAPSULE | Freq: Two times a day (BID) | ORAL | 0 refills | Status: DC

## 2018-11-06 MED ORDER — CEPHALEXIN 250 MG PO CAPS
500.0000 mg | ORAL_CAPSULE | Freq: Once | ORAL | Status: AC
  Administered 2018-11-06: 500 mg via ORAL
  Filled 2018-11-06: qty 2

## 2018-11-06 NOTE — ED Notes (Signed)
Discharge instructions discussed with pt. Pt verbalized understanding with no questions at this time. Pt going home with family

## 2018-11-06 NOTE — ED Provider Notes (Signed)
[facility] EMERGENCY DEPARTMENT Provider Note   CSN: [id number] Arrival date & time: 11/05/18  1815     History   Chief Complaint Chief Complaint  Patient presents with  . Weakness    HPI Laura Ferguson is a 85 y.o. female.     The history is provided by the patient and a relative.  Weakness Severity:  Moderate Onset quality:  Gradual Timing:  Constant Chronicity:  Chronic Relieved by:  Nothing Worsened by:  Nothing Associated symptoms: foul-smelling urine and headaches   Associated symptoms: no chest pain, no cough, no dysuria, no fever and no vomiting   Patient with history of CAD, hypertension, hyperlipidemia presents with generalized weakness.  She has had ongoing issues with generalized weakness for several months.  Over the past day she did note some pain in her right buttocks that seems to radiate into her right leg with some numbness.  She reports some cramping in the right leg as well No other new weakness is reported.  She denies fever/vomiting.  She reports a "slight" headache. No chest pain/shortness of breath/cough.  Denies dysuria but does report strong smelling urine Patient reports she can ambulate without difficulty. Past Medical History:  Diagnosis Date  . CAD (coronary artery disease)   . History of nuclear stress test 11/22/2011   bruce myoview; normal pattern of perfusio; post-stress EF 76%; low risk scan  . Hyperlipidemia   . Hypertension   . 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 Type 2 diabetes mellitus [facility])     Patient Active Problem List   Diagnosis Date Noted  . Diabetes mellitus ([place]) 01/29/2018  . Chronic back pain 11/12/2017  . GAD (generalized anxiety disorder) 10/16/2017  . Ankle swelling 06/01/2015  . Dizziness 03/26/2015  . CAD (coronary artery disease) 12/29/2012  . HTN (hypertension) 12/29/2012  . Hyperlipidemia with target  LDL less than 70 12/29/2012    Past Surgical History:  Procedure Laterality Date  . CARDIAC CATHETERIZATION  05/07/2007   EF 55%, focal mild hypocontractility in mid-distal inferior wall & mid posterolateral wall; severe multivessel CAD - susequent CABGx6 (Dr. Corky Downs)  . CORONARY ARTERY BYPASS GRAFT  05/09/2007   LIMA to LAD, veing to intermediate; SVG to OM1 & OM2; SVG to acute marginal & distal RCA (Dr. Roxan Hockey)  . TRANSTHORACIC ECHOCARDIOGRAM  02/13/2010   EF =>55%, vigorous contraction EF 65%; LA mild-mod dilated; IV normal diameter - normal CVP; trace MR; mild TR; trace AV regurg     OB History   No obstetric history on file.      Home Medications    Prior to Admission medications   Medication Sig Start Date End Date Taking? Authorizing Provider  ACCU-CHEK GUIDE test strip USE 1 STRIP THREE TIMES DAILY TO TEST BLOOD SUGAR 07/31/18   Philemon Kingdom, MD  Acetaminophen (TYLENOL 8 HOUR PO) Take 1 tablet by mouth every 8 (eight) hours as needed.     [provider]  aspirin 81 MG tablet Take 81 mg by mouth daily.    [provider]  Blood Glucose Monitoring Suppl (ACCU-CHEK GUIDE) w/Device KIT 1 Device by Does not apply route daily. Use to test blood sugar daily Dx code E11.65 12/05/17   Philemon Kingdom, MD  cephALEXin (KEFLEX) 500 MG capsule Take 1 capsule (500 mg total) by mouth 2 (two) times daily. 11/06/18  Ripley Fraise, MD  insulin aspart (NOVOLOG FLEXPEN) 100 UNIT/ML FlexPen INJECT 10-12 UNITS INTO THE SKIN THREE TIMES DAILY WITH MEALS 04/10/18   Philemon Kingdom, MD  Insulin Pen Needle (CAREFINE PEN NEEDLES) 32G X 4 MM MISC Use 4x a day 09/19/17   Philemon Kingdom, MD  Lancets (ACCU-CHEK SOFT TOUCH) lancets Use as instructed to test blood sugar 3 times daily 05/28/17   Pleas Koch, NP  LEVEMIR FLEXTOUCH 100 UNIT/ML Pen INJECT 30 UNITS SUBCUTANEOUSLY IN AM and 15 UNITS at bedtime 10/16/18   Philemon Kingdom, MD  losartan (COZAAR) 50 MG tablet  TAKE 1 & 1/2 (ONE & ONE-HALF) TABLETS BY MOUTH ONCE DAILY 10/20/18   Troy Sine, MD  metoprolol succinate (TOPROL-XL) 50 MG 24 hr tablet TAKE 1 TABLET BY MOUTH ONCE DAILY (TAKE  WITH  OR  IMMEDIATELY  FOLLOWING  A  MEAL) 07/31/18   Troy Sine, MD  Multiple Vitamin (MULTIVITAMIN) capsule Take 1 capsule by mouth daily.    [provider]  pantoprazole (PROTONIX) 40 MG tablet Take 1 tablet (40 mg total) by mouth daily. 03/21/18   Troy Sine, MD  Polyethyl Glycol-Propyl Glycol (SYSTANE OP) Apply 2 drops to eye 2 (two) times daily.     [provider]  rosuvastatin (CRESTOR) 10 MG tablet Take 1 tablet by mouth once daily 10/03/18   Troy Sine, MD  TRAVATAN Z 0.004 % SOLN ophthalmic solution Place 1 drop into both eyes at bedtime. 10/14/14   [provider]    Family History Family History  Problem Relation Age of Onset  . Cancer Mother   . Diabetes Sister   . Cancer Child     Social History Social History   Tobacco Use  . Smoking status: Never Smoker  . Smokeless tobacco: Never Used  . Tobacco comment: quit 1960's  smoked very lightly.  Substance Use Topics  . Alcohol use: No    Alcohol/week: 0.0 standard drinks  . Drug use: No     Allergies   Patient has no known allergies.   Review of Systems Review of Systems  Constitutional: Negative for fever.  Respiratory: Negative for cough.   Cardiovascular: Negative for chest pain.  Gastrointestinal: Negative for vomiting.  Genitourinary: Negative for dysuria.  Neurological: Positive for weakness and headaches.  All other systems reviewed and are negative.    Physical Exam Updated Vital Signs BP (!) 172/115   Pulse 70   Temp 98.5 F (36.9 C) (Oral)   Resp 15   Ht 1.626 m (_0 )   Wt 65.3 kg   SpO2 100%   BMI 24.72 kg/m   Physical Exam CONSTITUTIONAL: Elderly, appears younger than stated age HEAD: Normocephalic/atraumatic EYES: EOMI/PERRL, no nystagmus,  ENMT: Mucous  membranes moist NECK: supple no meningeal signs, no bruits CV: S1/S2 noted LUNGS: Lungs are clear to auscultation bilaterally, no apparent distress ABDOMEN: soft, nontender, no rebound or guarding GU:no cva tenderness NEURO:Awake/alert, face symmetric, no arm or leg drift is noted Equal 5/5 strength with shoulder abduction, elbow flex/extension, wrist flex/extension in upper extremities and equal hand grips bilaterally Equal 5/5 strength with hip flexion,knee flex/extension, foot dorsi/plantar flexion Cranial nerves 3/4/5/6/07/23/08/11/12 tested and intact No past pointing Sensation to light touch intact in all extremities EXTREMITIES: pulses normalx4, full ROM SKIN: warm, color normal PSYCH: no abnormalities of mood noted   ED Treatments / Results  Labs (all labs ordered are listed, but only abnormal results are displayed) Labs Reviewed  BASIC METABOLIC  PANEL - Abnormal; Notable for the following components:      Result Value   Glucose, Bld 149 (*)    Creatinine, Ser 1.47 (*)    GFR calc non Af Amer 32 (*)    GFR calc Af Amer 38 (*)    All other components within normal limits  URINALYSIS, ROUTINE W REFLEX MICROSCOPIC - Abnormal; Notable for the following components:   APPearance CLOUDY (*)    Leukocytes,Ua LARGE (*)    WBC, UA >50 (*)    Bacteria, UA MANY (*)    All other components within normal limits  CBG MONITORING, ED - Abnormal; Notable for the following components:   Glucose-Capillary 148 (*)    All other components within normal limits  CBC    EKG EKG Interpretation  Date/Time:  Wednesday November 05 2018 18:34:13 EDT Ventricular Rate:  71 PR Interval:  142 QRS Duration: 74 QT Interval:  366 QTC Calculation: 397 R Axis:   59 Text Interpretation:  Normal sinus rhythm with sinus arrhythmia Normal ECG No significant change since last tracing Confirmed by Ripley Fraise [phone]) on 11/06/2018 3:18:16 AM   Radiology No results found.  Procedures Procedures    Medications Ordered in ED Medications  sodium chloride flush (NS) 0.9 % injection 3 mL (3 mLs Intravenous Not Given 11/06/18 0338)  cephALEXin (KEFLEX) capsule 500 mg (500 mg Oral Given 11/06/18 0413)     Initial Impression / Assessment and Plan / ED Course  I have reviewed the triage vital signs and the nursing notes.  Pertinent labs  results that were available during my care of the patient were reviewed by me and considered in my medical decision making (see chart for details).       4:29 AM Patient presents for generalized weakness that she felt was worsening when she had some pain and numbness in her right leg.  Pain and numbness in her right leg sounds more consistent with lumbosacral radiculopathy On my exam there is no focal weakness.  No signs of acute stroke at this time.  Speech at baseline.  Patient does have evidence of a urinary tract infection.  She is not septic appearing and is nontoxic.  I feel she is appropriate outpatient management  Final Clinical Impressions(s) / ED Diagnoses   Final diagnoses:  Weakness  Acute cystitis without hematuria    ED Discharge Orders         Ordered    cephALEXin (KEFLEX) 500 MG capsule  2 times daily     11/06/18 0426           Ripley Fraise, MD 11/06/18 0430

## 2018-11-06 NOTE — ED Notes (Signed)
Updated pt daughter on wait time

## 2018-11-06 NOTE — Telephone Encounter (Signed)
Noted, patient evaluated in the ED early this morning, treated for acute cystitis. Symptoms were thought to be secondary to lumbar radiculopathy.

## 2018-11-27 ENCOUNTER — Ambulatory Visit (INDEPENDENT_AMBULATORY_CARE_PROVIDER_SITE_OTHER): Payer: Medicare HMO | Admitting: Cardiovascular Disease

## 2018-11-27 ENCOUNTER — Other Ambulatory Visit: Payer: Self-pay

## 2018-11-27 ENCOUNTER — Encounter: Payer: Self-pay | Admitting: Cardiovascular Disease

## 2018-11-27 VITALS — BP 130/68 | HR 72 | Temp 97.2°F | Ht 63.0 in | Wt 134.0 lb

## 2018-11-27 DIAGNOSIS — I2510 Atherosclerotic heart disease of native coronary artery without angina pectoris: Secondary | ICD-10-CM

## 2018-11-27 DIAGNOSIS — Z794 Long term (current) use of insulin: Secondary | ICD-10-CM

## 2018-11-27 DIAGNOSIS — E1159 Type 2 diabetes mellitus with other circulatory complications: Secondary | ICD-10-CM

## 2018-11-27 DIAGNOSIS — I10 Essential (primary) hypertension: Secondary | ICD-10-CM

## 2018-11-27 DIAGNOSIS — Z951 Presence of aortocoronary bypass graft: Secondary | ICD-10-CM

## 2018-11-27 DIAGNOSIS — E785 Hyperlipidemia, unspecified: Secondary | ICD-10-CM

## 2018-11-27 DIAGNOSIS — K219 Gastro-esophageal reflux disease without esophagitis: Secondary | ICD-10-CM

## 2018-11-27 MED ORDER — METOPROLOL SUCCINATE ER 50 MG PO TB24: ORAL_TABLET | ORAL | 3 refills | Status: DC

## 2018-11-27 NOTE — Patient Instructions (Addendum)
Medication Instructions:  Your physician has recommended you make the following change in your medication:    TAKE  50 MG (ONE TABLET) OF YOUR METOPROLOL SUCCINATE IN THE MORNING AND 25 MG (HALF A TABLET) AT NIGHT  *If you need a refill on your cardiac medications before your next appointment, please call your pharmacy*  Lab Work: none If you have labs (blood work) drawn today and your tests are completely normal, you will receive your results only by: Marland Kitchen MyChart Message (if you have MyChart) OR . A paper copy in the mail If you have any lab test that is abnormal or we need to change your treatment, we will call you to review the results.  Testing/Procedures: none  Follow-Up: At [facility], you and your health needs are our priority.  As part of our continuing mission to provide you with exceptional heart care, we have created designated Provider Care Teams.  These Care Teams include your primary Cardiologist (physician) and Advanced Practice Providers (APPs -  Physician Assistants and Nurse Practitioners) who all work together to provide you with the care you need, when you need it.  Your next appointment:   6 months  The format for your next appointment:   In Person  Provider:   You may see Dr. Claiborne Billings or one of the following Advanced Practice Providers on your designated Care Team:    Almyra Deforest, PA-C  Fabian Sharp, PA-C or   Roby Lofts, [place]
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
## 2018-11-29 ENCOUNTER — Encounter: Payer: Self-pay | Admitting: Cardiovascular Disease

## 2018-12-10 ENCOUNTER — Other Ambulatory Visit: Payer: Self-pay | Admitting: Internal Medicine

## 2018-12-15 ENCOUNTER — Telehealth: Payer: Self-pay

## 2018-12-15 ENCOUNTER — Ambulatory Visit: Payer: Self-pay | Admitting: *Deleted

## 2018-12-15 ENCOUNTER — Other Ambulatory Visit: Payer: Self-pay | Admitting: Cardiovascular Disease

## 2018-12-15 NOTE — Telephone Encounter (Signed)
Contacted by [place]; the pt was seen in the ED for a UTI;4-5 weeks ago; she was treated with antibiotics; the pt called for a follow up appointment; she did have 1 episode of emesis 12/15/2018 around 1500, but is not having symptoms; the pt says she feels better since she vomited; she denies fever (temp 97.1) and chills; the pt said she is having pain around her right ovary and back which started PM 12/14/2018; she did not have this pain with her UTI; the pt says she can barely feel the pain now; recommendations made per nurse triage protocol; she would like to be seen in the office instead; the pt sees Alma Friendly.[facility]; the pt can be contacted at [phone]; notified Rena     Reason for Disposition . [1] MILD-MODERATE pain AND [2] constant AND [3] present > 2 hours  Answer Assessment - Initial Assessment Questions 1. LOCATION: "Where does it hurt?"      Right ovary 2. RADIATION: "Does the pain shoot anywhere else?" (e.g., chest, back)    Right hip/back 3. ONSET: "When did the pain begin?" (e.g., minutes, hours or days ago)      hours 4. SUDDEN: "Gradual or sudden onset?"     gradually 5. PATTERN "Does the pain come and go, or is it constant?"    - If constant: "Is it getting better, staying the same, or worsening?"      (Note: Constant means the pain never goes away completely; most serious pain is constant and it progresses)     - If intermittent: "How long does it last?" "Do you have pain now?"     (Note: Intermittent means the pain goes away completely between bouts)     Constant today 6. SEVERITY: "How bad is the pain?"  (e.g., Scale 1-10; mild, moderate, or severe)   - MILD (1-3): doesn't interfere with normal activities, abdomen soft and not tender to touch    - MODERATE (4-7): interferes with normal activities or awakens from sleep, tender to touch    - SEVERE (8-10): excruciating pain, doubled over, unable to do any normal activities      mild 7.  RECURRENT SYMPTOM: "Have you ever had this type of abdominal pain before?" If so, ask: "When was the last time?" and "What happened that time?"      no 8. CAUSE: "What do you think is causing the abdominal pain?"     Not sure 9. RELIEVING/AGGRAVATING FACTORS: "What makes it better or worse?" (e.g., movement, antacids, bowel movement)   none 10. OTHER SYMPTOMS: "Has there been any vomiting, diarrhea, constipation, or urine problems?"       vomiting 11. PREGNANCY: "Is there any chance you are pregnant?" "When was your last menstrual period?"      no  Protocols used: ABDOMINAL PAIN - [facility]

## 2018-12-15 NOTE — Telephone Encounter (Signed)
Noted. Please contact patient to see if she'd still like to come in for evaluation as previously scheduled.

## 2018-12-15 NOTE — Telephone Encounter (Signed)
error 

## 2018-12-15 NOTE — Telephone Encounter (Signed)
I spoke with pt and reviewed pts symptoms with her;pt said she was wanting a FU from 11/05/18 ED visit when pt was told she had a pinched nerve and UTI. Pt has lower rt sided dull achy  pain on and off. Pt is not sure the last time pt had rt sided pain; pt vomited about 1 hr ago. Not sure why. Pt has prod cough with white phlegm and runny nose due to sinus. Pt lost taste and smell years ago.pt is not having burning,pain or frequency of urine. Pt already has appt on  12/18/18 with Gentry Fitz NP. I advised pt I would send the note to Gentry Fitz NP to confirm appt on 12/18/18 as in office appt and Vallarie Mare CMA would call pt back. Pt said to cancel the appt because she is changing doctors. I asked her questions and 2 other people asked her questions and she understands why but  She thinks she will just change doctors. I apologized to pt and let her know I was not trying to upset her and I would be glad to send the note to Gentry Fitz NP but pt said no to cancel the appt. FYI to Gentry Fitz NP and Vallarie Mare CMA.

## 2018-12-17 NOTE — Telephone Encounter (Signed)
Spoken to patient yesterday on 12/16/2018 and she stated that she change her mind, she does not want to be seen. She feels okay and will make an appointment if needed.

## 2018-12-18 ENCOUNTER — Ambulatory Visit: Payer: Medicare HMO | Admitting: Primary Care

## 2018-12-25 ENCOUNTER — Other Ambulatory Visit: Payer: Self-pay | Admitting: Internal Medicine

## 2018-12-25 ENCOUNTER — Telehealth: Payer: Self-pay | Admitting: Internal Medicine

## 2018-12-25 MED ORDER — INSULIN PEN NEEDLE 32G X 4 MM MISC: 11 refills | Status: DC

## 2018-12-25 NOTE — Telephone Encounter (Signed)
MEDICATION: Insulin Pen Needle (CAREFINE PEN NEEDLES) 32G X 4 MM MISC  PHARMACY:   [place], [place] - [employer] Phone:  [phone]  Fax:  [phone]      IS THIS A 90 DAY SUPPLY : ?  IS PATIENT OUT OF MEDICATION: Yes  IF NOT; HOW MUCH IS LEFT: 0  LAST APPOINTMENT DATE: @11 /25/2020  NEXT APPOINTMENT DATE:@2 /02/2019  DO WE HAVE YOUR PERMISSION TO LEAVE A DETAILED MESSAGE: Yes  OTHER COMMENTS:    **Let patient know to contact pharmacy at the end of the day to make sure medication is ready. **  ** Please notify patient to allow 48-72 hours to process**  **Encourage patient to contact the pharmacy for refills or they can request refills through [facility]**

## 2018-12-25 NOTE — Telephone Encounter (Signed)
New RX sent.  Med list updated.

## 2018-12-25 NOTE — Telephone Encounter (Signed)
MEDICATION: short/small BD pen needles (insurance will not cover the Relion pen needles)      PHARMACY:   [place], [place] Phone:  [phone]  Fax:  [phone]       IS THIS A 90 DAY SUPPLY : ?  IS PATIENT OUT OF MEDICATION: Yes  IF NOT; HOW MUCH IS LEFT: 0  LAST APPOINTMENT DATE: @12 /10/2018  NEXT APPOINTMENT DATE:@2 /02/2019  DO WE HAVE YOUR PERMISSION TO LEAVE A DETAILED MESSAGE:  Yes  OTHER COMMENTS:    **Let patient know to contact pharmacy at the end of the day to make sure medication is ready. **  ** Please notify patient to allow 48-72 hours to process**  **Encourage patient to contact the pharmacy for refills or they can request refills through [facility]**

## 2019-01-05 ENCOUNTER — Telehealth: Payer: Self-pay

## 2019-01-05 NOTE — Telephone Encounter (Signed)
I need her sugars to understand what I can recommend to change.Marland KitchenMarland Kitchen

## 2019-01-05 NOTE — Telephone Encounter (Signed)
Patient states last few days CBG up to 240.  Yesterday before dinner 242 and gave 12 units and went down to 80.

## 2019-01-05 NOTE — Telephone Encounter (Signed)
Pt states her CBG's have been running high, has adjusted with insulin but has not been able to control her CBG's. Requesting returned call to further discuss

## 2019-01-06 NOTE — Telephone Encounter (Signed)
Spoke to patient to try and get more information, patient sounded very confused and having a hard time getting her reading from the meter.  Patient states she does not eat a lot and generally feels ok.  Patient does eat a light dinner but admits to getting up at night to snack.  Morning sugars before breakfast 242-268 around 8 AM  Sugars around 11:30 165  Rest of the day sugars are in the 242-268 range again.

## 2019-01-06 NOTE — Telephone Encounter (Signed)
Melissa, can you please call pt:  Please start Ozempic 0.25 mg once a week in a.m. (for example on Sunday morning) x 4 weeks, then increase to 0.5 mg weekly in a.m. if no nausea or hypoglycemia. I am hoping that this is approved for her, especially since she has heart disease.  We can send a prescription to the pharmacy for 0.5 mg x 2 pens with 2 refills.

## 2019-01-07 MED ORDER — OZEMPIC (0.25 OR 0.5 MG/DOSE) 2 MG/1.5ML ~~LOC~~ SOPN: 0.2500 mg | PEN_INJECTOR | SUBCUTANEOUS | 2 refills | Status: DC

## 2019-01-07 NOTE — Addendum Note (Signed)
Addended by: Cardell Peach I on: 01/07/2019 01:25 PM   Modules accepted: Orders

## 2019-01-07 NOTE — Telephone Encounter (Signed)
Pt returned call. Informed about Dr. Arman Filter orders and rationale as listed below. Pt verbalized acceptance and understanding of all information provided. No further concerns nor questions voiced.

## 2019-01-07 NOTE — Telephone Encounter (Signed)
RX sent.  Left message for patient to return our call at [phone].

## 2019-01-16 ENCOUNTER — Other Ambulatory Visit: Payer: Self-pay | Admitting: Cardiovascular Disease

## 2019-01-16 DIAGNOSIS — E785 Hyperlipidemia, unspecified: Secondary | ICD-10-CM

## 2019-01-19 ENCOUNTER — Other Ambulatory Visit: Payer: Self-pay | Admitting: Cardiovascular Disease

## 2019-01-19 DIAGNOSIS — I10 Essential (primary) hypertension: Secondary | ICD-10-CM

## 2019-01-20 ENCOUNTER — Other Ambulatory Visit: Payer: Self-pay | Admitting: Cardiovascular Disease

## 2019-01-20 DIAGNOSIS — I10 Essential (primary) hypertension: Secondary | ICD-10-CM

## 2019-01-21 ENCOUNTER — Other Ambulatory Visit: Payer: Self-pay

## 2019-01-21 DIAGNOSIS — I10 Essential (primary) hypertension: Secondary | ICD-10-CM

## 2019-01-21 MED ORDER — LOSARTAN POTASSIUM 50 MG PO TABS: ORAL_TABLET | ORAL | 2 refills | Status: DC

## 2019-02-16 ENCOUNTER — Telehealth: Payer: Self-pay | Admitting: Cardiovascular Disease

## 2019-02-16 NOTE — Telephone Encounter (Signed)
Advised pt to reach out to PCP regarding concerns with the Covid vaccinations.

## 2019-02-16 NOTE — Telephone Encounter (Signed)
Patient wants to know if it is safe for her to get a COVID Vaccine. Please call to discuss the safety for her

## 2019-02-17 ENCOUNTER — Ambulatory Visit: Payer: Medicare HMO | Admitting: Internal Medicine

## 2019-02-17 ENCOUNTER — Ambulatory Visit (INDEPENDENT_AMBULATORY_CARE_PROVIDER_SITE_OTHER): Payer: Medicare HMO | Admitting: Internal Medicine

## 2019-02-17 ENCOUNTER — Encounter: Payer: Self-pay | Admitting: Internal Medicine

## 2019-02-17 ENCOUNTER — Other Ambulatory Visit: Payer: Self-pay

## 2019-02-17 VITALS — BP 130/90 | HR 60 | Ht 63.0 in | Wt 133.0 lb

## 2019-02-17 DIAGNOSIS — E1159 Type 2 diabetes mellitus with other circulatory complications: Secondary | ICD-10-CM | POA: Diagnosis not present

## 2019-02-17 DIAGNOSIS — E785 Hyperlipidemia, unspecified: Secondary | ICD-10-CM | POA: Diagnosis not present

## 2019-02-17 DIAGNOSIS — Z794 Long term (current) use of insulin: Secondary | ICD-10-CM

## 2019-02-17 LAB — POCT GLYCOSYLATED HEMOGLOBIN (HGB A1C): Hemoglobin A1C: 7.9 % — AB (ref 4.0–5.6)

## 2019-02-17 MED ORDER — NOVOLOG FLEXPEN 100 UNIT/ML ~~LOC~~ SOPN: PEN_INJECTOR | SUBCUTANEOUS | 1 refills | Status: DC

## 2019-02-17 NOTE — Addendum Note (Signed)
Addended by: Darliss Ridgel I on: 02/17/2019 10:19 AM   Modules accepted: Orders

## 2019-02-17 NOTE — Progress Notes (Signed)
Patient ID: Laura Ferguson, female   DOB: 01/21/1932, 86 y.o.   MRN: [id number]   This visit occurred during the SARS-CoV-2 public health emergency.  Safety protocols were in place, including screening questions prior to the visit, additional usage of staff PPE, and extensive cleaning of exam room while observing appropriate contact time as indicated for disinfecting solutions.   HPI: Laura Ferguson is a 86 y.o.-year-old female, returning for f/u for DM2, dx in 1990s, insulin-dependent for years, uncontrolled, with complications (CAD - s/p CABG x6, CKD, DR, PN). Last visit 4 months ago.  Reviewed HbA1c levels: Lab Results  Component Value Date   HGBA1C 8.4 (A) 10/16/2018   HGBA1C 8.8 (A) 06/16/2018   HGBA1C 8.9 (A) 01/29/2018  10/16/2016: HbA1c 8.8%  She was on: - Levemir 30 units in a.m. and 10 units at bedtime - Novolog 6-8, 10units before meals - may skip lunch, in which case she skips the insulin midday, also In the past, I advised her to add 4 to 5 units of NovoLog if she had a snack at night, but she did not do so.  At last visit we changed to: - Levemir 30 units in a.m.  - NOT TAKING - Novolog 10-12 units 15 min before meals but she also uses the same dose for correction, except occasionally taking 6 units for example after dinner.  She checks sugars 3 times a day per meter download: - am: 156-301 >> 129, 147-306, 451 >> 122, 132-293 - 2h after b'fast: 64, 83-189, 207, 253 >> n/c - before lunch: 126, 182-264 >> 77, 81, 163-287, 371 >> 103, 119-263 - 2h after lunch: n/c >> 64, 85-164, 236 >> 185 >> 277-304, 378 - before dinner:  64, 82, 158-338 >> 106, 281-356 >> 128-386 - 2h after dinner: 110, 140 >> n/c >> 100 >> 149-321 >> 86, 122-311 - bedtime: 217, 209 >> n/c >> 80-178  - nighttime: n/c >> 136-157 >> 118-176 Lowest sugar was 64 >> 56 >> 86; it is unclear at which level she has hypoglycemia awareness Highest sugar was 338 >> 451 >> 378  Glucometer: AccuChek  Pt's meals  are: - Breakfast: oatmeal, grits, apple sauce - Lunch: tuna sandwich, bologna - Dinner: soup, sandwich  -+ CKD, last BUN/creatinine:  Lab Results  Component Value Date   BUN 18 11/05/2018   BUN 23 09/01/2018   CREATININE 1.47 (H) 11/05/2018   CREATININE 1.50 (H) 09/01/2018  On losartan 75. -+ HL; last set of lipids: Lab Results  Component Value Date   CHOL 139 09/01/2018   HDL 72.30 09/01/2018   LDLCALC 36 09/01/2018   TRIG 152.0 (H) 09/01/2018   CHOLHDL 2 09/01/2018  On Crestor 10.  Cardiologist: Laura Ferguson.  - last eye exam was in 05/2018: + DR, + intraocular injections. Laura Ferguson. - + numbness and tingling in her feet.  She also has HTN.  ROS: Constitutional: no weight gain/no weight loss, no fatigue, no subjective hyperthermia, no subjective hypothermia Eyes: no blurry vision, no xerophthalmia ENT: no sore throat, no nodules palpated in neck, no dysphagia, no odynophagia, no hoarseness Cardiovascular: no CP/no SOB/no palpitations/no leg swelling Respiratory: no cough/no SOB/no wheezing Gastrointestinal: no N/no V/no D/no C/no acid reflux Musculoskeletal: no muscle aches/no joint aches Skin: no rashes, no hair loss Neurological: no tremors/+ numbness/+ tingling/no dizziness  I reviewed pt's medications, allergies, PMH, social hx, family hx, and changes were documented in the history of present illness. Otherwise, unchanged from my initial visit note.  Past Medical History:  Diagnosis Date  . CAD (coronary artery disease)   . History of nuclear stress test 11/22/2011   bruce myoview; normal pattern of perfusio; post-stress EF 76%; low risk scan  . Hyperlipidemia   . Hypertension   . 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 Type 2 diabetes mellitus ([place])    Past Surgical History:  Procedure Laterality Date  . CARDIAC CATHETERIZATION  05/07/2007   EF 55%, focal mild hypocontractility  in mid-distal inferior wall & mid posterolateral wall; severe multivessel CAD - susequent CABGx6 (Dr. Corky Ferguson)  . CORONARY ARTERY BYPASS GRAFT  05/09/2007   LIMA to LAD, veing to intermediate; SVG to OM1 & OM2; SVG to acute marginal & distal RCA (Dr. Roxan Ferguson)  . TRANSTHORACIC ECHOCARDIOGRAM  02/13/2010   EF =>55%, vigorous contraction EF 65%; LA mild-mod dilated; IV normal diameter - normal CVP; trace MR; mild TR; trace AV regurg   Social History   Social History  . Marital status: Widowed    Spouse name: N/A  . Number of children: 8   Occupational History  . homemaker   Social History Main Topics  . Smoking status: Never Smoker  . Smokeless tobacco: Never Used     Comment: quit 1960's  smoked very lightly.  . Alcohol use No  . Drug use: No     Social History Narrative   Married.   Retired.   Works as a Building control surveyor.    Enjoys helping other.    Current Outpatient Medications on File Prior to Visit  Medication Sig Dispense Refill  . rosuvastatin (CRESTOR) 10 MG tablet Take 1 tablet by mouth once daily 90 tablet 0  . ACCU-CHEK GUIDE test strip USE 1 STRIP THREE TIMES DAILY TO TEST BLOOD SUGAR 100 each 11  . Acetaminophen (TYLENOL 8 HOUR PO) Take 1 tablet by mouth every 8 (eight) hours as needed.     Marland Kitchen aspirin 81 MG tablet Take 81 mg by mouth daily.    . Blood Glucose Monitoring Suppl (ACCU-CHEK GUIDE) w/Device KIT 1 Device by Does not apply route daily. Use to test blood sugar daily Dx code E11.65 1 kit 1  . cephALEXin (KEFLEX) 500 MG capsule Take 1 capsule (500 mg total) by mouth 2 (two) times daily. 14 capsule 0  . Insulin Pen Needle 32G X 4 MM MISC Use with insulin pen 4 times daily 300 each 11  . Lancets (ACCU-CHEK SOFT TOUCH) lancets Use as instructed to test blood sugar 3 times daily 100 each 5  . LEVEMIR FLEXTOUCH 100 UNIT/ML Pen INJECT 30 UNITS SUBCUTANEOUSLY IN AM and 15 UNITS at bedtime 30 mL 3  . losartan (COZAAR) 50 MG tablet TAKE 1 & 1/2 (ONE & ONE-HALF) TABLETS  BY MOUTH ONCE DAILY 135 tablet 2  . metoprolol succinate (TOPROL-XL) 50 MG 24 hr tablet TAKE 1 TABLET (50 MG) BY MOUTH IN THE MORNING AND HALF A TABLET (25 MG) BY MOUTH IN THE EVENING 90 tablet 3  . Multiple Vitamin (MULTIVITAMIN) capsule Take 1 capsule by mouth daily.    Marland Kitchen NOVOLOG FLEXPEN 100 UNIT/ML FlexPen INJECT 6 TO 10 UNITS SUBCUTANEOUSLY THREE TIMES DAILY WITH MEALS 15 mL 1  . pantoprazole (PROTONIX) 40 MG tablet Take 1 tablet by mouth once daily 90 tablet 0  . Polyethyl Glycol-Propyl Glycol (SYSTANE OP) Apply 2 drops to eye 2 (two) times  daily.     . Semaglutide,0.25 or 0.5MG/DOS, (OZEMPIC, 0.25 OR 0.5 MG/DOSE,) 2 MG/1.5ML SOPN Inject 0.25 mg into the skin once a week. After 4 weeks increase dose to 0.5 once a week 2 pen 2  . TRAVATAN Z 0.004 % SOLN ophthalmic solution Place 1 drop into both eyes at bedtime.     No current facility-administered medications on file prior to visit.   No Known Allergies Family History  Problem Relation Age of Onset  . Cancer Mother   . Diabetes Sister   . Cancer Child    Pt has FH of DM in [place], sister - died from DM complications (refused to start HD), sons - 1 son died from DM complications.  Her youngest son died Feb 15, 2017 (cancer). He was 53.  PE: BP 130/90   Pulse 60   Ht 5' 3"  (1.6 m)   Wt 133 lb (60.3 kg)   SpO2 96%   BMI 23.56 kg/m  Wt Readings from Last 3 Encounters:  02/17/19 133 lb (60.3 kg)  11/27/18 134 lb (60.8 kg)  11/05/18 144 lb (65.3 kg)   Constitutional: normal weight, in NAD Eyes: PERRLA, EOMI, no exophthalmos ENT: moist mucous membranes, no thyromegaly, no cervical lymphadenopathy Cardiovascular: RRR, No MRG Respiratory: CTA B Gastrointestinal: abdomen soft, NT, ND, BS+ Musculoskeletal: no deformities, strength intact in all 4 Skin: moist, warm, no rashes Neurological: no tremor with outstretched hands, DTR normal in all 4  ASSESSMENT: 1. DM2, insulin-dependent, uncontrolled, with complications - CAD - s/p CABG  x6 - CKD - DR - PN  2. HL  PLAN:  1. Patient with longstanding, uncontrolled, type 2 diabetes, on basal-bolus insulin regimen, with deterioration of diabetes control after the death of her son.  She was not checking sugars due to stress and grief.  She also started to drink sugary drinks.  We discussed about stopping these and increase her NovoLog dosage.  Her sugars started to improve afterwards.  At last visit, she was missing Levemir doses at night and sugars in the morning were higher.  She also was not using the recommended dose of NovoLog, but only 6 to 8 units per meal.  Sugars were high throughout the day so we increased her NovoLog dose and strongly advised her about not forgetting the dose of Levemir at night. -At this visit, reviewing her insulin regimen, this is not consistent with the instructions given at last visit.  She is not taking any Levemir bedtime so her sugars in the morning are in the 200s most of the time.  We will add 10 units at night.  Also, she is correcting high blood sugars after meals with the same amount of insulin that she takes for the meals: 10 to 12 units.  Subsequently, she is dropping her sugars too low, occasionally to the 50s (however, this is not obvious in her meter download today).  I advised her that if she corrects, to take 4 to 6 units.  She also corrects sugars after dinner and she subsequently drops from 200s to 300s down to 80s to 150's at bedtime.  I advised him that this is dangerous and ideally we would avoid high blood sugars after dinner, rather than correcting the this aggressively.  Therefore, at today's visit, I advised her to take a slightly higher dose of NovoLog before meals and not to correct sugars at night only if her sugars are higher than 300s and even then, to only take up to 4 units. - I  suggested to:  Patient Instructions  Please increase: - Levemir 30 units in a.m. and add 10 units units at bedtime - Novolog 12-15 units 15 min before  meals  DO NOT CORRECT the blood sugars after dinner, uness they are >300, and hen, only use up to 5 units.  Please return in 3-4 months with your sugar log.   - we checked her HbA1c: 7.9% (lower) - advised to check sugars at different times of the day - 3x a day, rotating check times - advised for yearly eye exams >> she is UTD - return to clinic in 3-4 months    2. HL -Reviewed latest lipid panel from 08/2018: LDL at goal, triglycerides slightly high, HDL excellent Lab Results  Component Value Date   CHOL 139 09/01/2018   HDL 72.30 09/01/2018   LDLCALC 36 09/01/2018   TRIG 152.0 (H) 09/01/2018   CHOLHDL 2 09/01/2018  -Continues Crestor without side effects.   Philemon Kingdom, MD PhD [facility] Endocrinology

## 2019-02-17 NOTE — Patient Instructions (Addendum)
Please increase: - Levemir 30 units in a.m. and add 10 units units at bedtime - Novolog 12-15 units 15 min before meals  DO NOT CORRECT the blood sugars after dinner, uness they are >300, and hen, only use up to 5 units.  Please return in 3-4 months with your sugar log.

## 2019-03-09 ENCOUNTER — Other Ambulatory Visit: Payer: Self-pay

## 2019-03-09 ENCOUNTER — Ambulatory Visit (INDEPENDENT_AMBULATORY_CARE_PROVIDER_SITE_OTHER)
Admission: RE | Admit: 2019-03-09 | Discharge: 2019-03-09 | Disposition: A | Discharge status: Home / Self Care | Payer: Medicare HMO | Source: Ambulatory Visit | Attending: Primary Care | Admitting: Primary Care

## 2019-03-09 ENCOUNTER — Encounter: Payer: Self-pay | Admitting: Primary Care

## 2019-03-09 ENCOUNTER — Ambulatory Visit (INDEPENDENT_AMBULATORY_CARE_PROVIDER_SITE_OTHER): Payer: Medicare HMO | Admitting: Primary Care

## 2019-03-09 VITALS — BP 160/98 | HR 78 | Temp 96.9°F | Ht 63.0 in | Wt 133.5 lb

## 2019-03-09 DIAGNOSIS — M79672 Pain in left foot: Secondary | ICD-10-CM

## 2019-03-09 DIAGNOSIS — M25572 Pain in left ankle and joints of left foot: Secondary | ICD-10-CM

## 2019-03-09 HISTORY — DX: Pain in left foot: M79.672

## 2019-03-09 NOTE — Progress Notes (Signed)
Subjective:    Patient ID: Laura Ferguson, female    DOB: 1932-08-24, 86 y.o.   MRN: [id number]  HPI  This visit occurred during the SARS-CoV-2 public health emergency.  Safety protocols were in place, including screening questions prior to the visit, additional usage of staff PPE, and extensive cleaning of exam room while observing appropriate contact time as indicated for disinfecting solutions.   Laura Ferguson is a 86 year old female with a history of uncontrolled diabetes, hypertension, CAD, anxiety disorder, chronic back pain who presents today with a chief complaint of foot and ankle pain.  Her pain is located to the entire ankle, distal tib/fib,  and dorsal foot that began three weeks ago after a fall. She was standing on her living room chair hanging curtains, felt the chair move from underneath her, and fell hitting her left lateral foot.   Since then she's been ambulating without difficulty but has noticed increased swelling which prompted her visit today. She's not applied ice, she is taking Tylenol.   Review of Systems  Musculoskeletal: Positive for arthralgias and joint swelling.  Skin: Negative for color change.  Neurological: Negative for weakness.       Past Medical History:  Diagnosis Date  . CAD (coronary artery disease)   . History of nuclear stress test 11/22/2011   bruce myoview; normal pattern of perfusio; post-stress EF 76%; low risk scan  . Hyperlipidemia   . Hypertension   . 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 Type 2 diabetes mellitus ([place])      Social History   Socioeconomic History  . Marital status: Widowed    Spouse name: Not on file  . Number of children: 7  . Years of education: Not on file  . Highest education level: Not on file  Occupational History  . Not on file  Tobacco Use  . Smoking status: Never Smoker  . Smokeless tobacco: Never Used  . Tobacco  comment: quit 1960's  smoked very lightly.  Substance and Sexual Activity  . Alcohol use: No    Alcohol/week: 0.0 standard drinks  . Drug use: No  . Sexual activity: Not on file  Other Topics Concern  . Not on file  Social History Narrative   Married.   Retired.   Works as a Building control surveyor.    Enjoys helping other.    Social Determinants of Health   Financial Resource Strain:   . Difficulty of Paying Living Expenses: Not on file  Food Insecurity:   . Worried About Charity fundraiser in the Last Year: Not on file  . Ran Out of Food in the Last Year: Not on file  Transportation Needs:   . Lack of Transportation (Medical): Not on file  . Lack of Transportation (Non-Medical): Not on file  Physical Activity:   . Days of Exercise per Week: Not on file  . Minutes of Exercise per Session: Not on file  Stress:   . Feeling of Stress : Not on file  Social Connections:   . Frequency of Communication with Friends and Family: Not on file  . Frequency of Social Gatherings with Friends and Family: Not on file  . Attends Religious Services: Not on file  . Active Member of Clubs or Organizations: Not on file  . Attends Archivist Meetings: Not on file  . Marital  Status: Not on file  Intimate Partner Violence:   . Fear of Current or Ex-Partner: Not on file  . Emotionally Abused: Not on file  . Physically Abused: Not on file  . Sexually Abused: Not on file    Past Surgical History:  Procedure Laterality Date  . CARDIAC CATHETERIZATION  05/07/2007   EF 55%, focal mild hypocontractility in mid-distal inferior wall & mid posterolateral wall; severe multivessel CAD - susequent CABGx6 (Dr. Corky Downs)  . CORONARY ARTERY BYPASS GRAFT  05/09/2007   LIMA to LAD, veing to intermediate; SVG to OM1 & OM2; SVG to acute marginal & distal RCA (Dr. Roxan Hockey)  . TRANSTHORACIC ECHOCARDIOGRAM  02/13/2010   EF =>55%, vigorous contraction EF 65%; LA mild-mod dilated; IV normal diameter - normal CVP;  trace MR; mild TR; trace AV regurg    Family History  Problem Relation Age of Onset  . Cancer Mother   . Diabetes Sister   . Cancer Child     No Known Allergies  Current Outpatient Medications on File Prior to Visit  Medication Sig Dispense Refill  . ACCU-CHEK GUIDE test strip USE 1 STRIP THREE TIMES DAILY TO TEST BLOOD SUGAR 100 each 11  . Acetaminophen (TYLENOL 8 HOUR PO) Take 1 tablet by mouth every 8 (eight) hours as needed.     Marland Kitchen aspirin 81 MG tablet Take 81 mg by mouth daily.    . Blood Glucose Monitoring Suppl (ACCU-CHEK GUIDE) w/Device KIT 1 Device by Does not apply route daily. Use to test blood sugar daily Dx code E11.65 1 kit 1  . Insulin Pen Needle 32G X 4 MM MISC Use with insulin pen 4 times daily 300 each 11  . Lancets (ACCU-CHEK SOFT TOUCH) lancets Use as instructed to test blood sugar 3 times daily 100 each 5  . LEVEMIR FLEXTOUCH 100 UNIT/ML Pen INJECT 30 UNITS SUBCUTANEOUSLY IN AM and 15 UNITS at bedtime 30 mL 3  . losartan (COZAAR) 50 MG tablet TAKE 1 & 1/2 (ONE & ONE-HALF) TABLETS BY MOUTH ONCE DAILY 135 tablet 2  . metoprolol succinate (TOPROL-XL) 50 MG 24 hr tablet TAKE 1 TABLET (50 MG) BY MOUTH IN THE MORNING AND HALF A TABLET (25 MG) BY MOUTH IN THE EVENING 90 tablet 3  . Multiple Vitamin (MULTIVITAMIN) capsule Take 1 capsule by mouth daily.    Marland Kitchen NOVOLOG FLEXPEN 100 UNIT/ML FlexPen INJECT 12-15UNITS SUBCUTANEOUSLY THREE TIMES DAILY WITH MEALS 15 mL 1  . pantoprazole (PROTONIX) 40 MG tablet Take 1 tablet by mouth once daily 90 tablet 0  . Polyethyl Glycol-Propyl Glycol (SYSTANE OP) Apply 2 drops to eye 2 (two) times daily.     . rosuvastatin (CRESTOR) 10 MG tablet Take 1 tablet by mouth once daily 90 tablet 0  . TRAVATAN Z 0.004 % SOLN ophthalmic solution Place 1 drop into both eyes at bedtime.     No current facility-administered medications on file prior to visit.    BP (!) 160/98   Pulse 78   Temp (!) 96.9 F (36.1 C) (Temporal)   Ht 5' 3"  (1.6 m)   Wt  133 lb 8 oz (60.6 kg)   SpO2 98%   BMI 23.65 kg/m    Objective:   Physical Exam  Constitutional: She appears well-nourished.  Respiratory: Effort normal.  Musculoskeletal:     Left ankle: Swelling present. No deformity. Tenderness present over the medial malleolus and proximal fibula. Normal range of motion. Normal pulse.       Feet:  Comments: Ambulating in clinic with mild limp. Moderate swelling noted to entire ankle.   Skin: Skin is warm and dry. No erythema.           Assessment & Plan:

## 2019-03-09 NOTE — Patient Instructions (Signed)
Complete xray(s) prior to leaving today. I will notify you of your results once received.  Elevate your leg when resting to reduce swelling.  Wrap the foot with an ACE bandage for support.   It was a pleasure to see you today!

## 2019-03-09 NOTE — Assessment & Plan Note (Signed)
Since fall from standing in a chair three weeks ago. Exam today with moderate swelling, overall good ROM, ambulates quite well considering.  Check plain films today given trauma.  ACE bandage provided and applied post xray. Ice and elevate extremity.  Await results, consider orthopedic evaluation.  

## 2019-03-09 NOTE — Assessment & Plan Note (Signed)
Since fall from standing in a chair three weeks ago. Exam today with moderate swelling, overall good ROM, ambulates quite well considering.  Check plain films today given trauma.  ACE bandage provided and applied post xray. Ice and elevate extremity.  Await results, consider orthopedic evaluation.

## 2019-03-11 ENCOUNTER — Telehealth: Payer: Self-pay | Admitting: Primary Care

## 2019-03-11 NOTE — Telephone Encounter (Signed)
Patient called She stated she is returning a call from you that she received today

## 2019-03-12 NOTE — Telephone Encounter (Signed)
Spoken and notified patient of Kate Clark's comments. Patient verbalized understanding.  

## 2019-03-15 ENCOUNTER — Ambulatory Visit: Payer: Medicare HMO | Attending: Internal Medicine

## 2019-03-15 DIAGNOSIS — Z23 Encounter for immunization: Secondary | ICD-10-CM | POA: Insufficient documentation

## 2019-03-15 NOTE — Progress Notes (Signed)
   Covid-19 Vaccination Clinic  Name:  Laura Ferguson    MRN: [id number] DOB: 1932/05/16  03/15/2019  Laura Ferguson was observed post Covid-19 immunization for 15 minutes without incidence. She was provided with Vaccine Information Sheet and instruction to access the V-Safe system.   Laura Ferguson was instructed to call 911 with any severe reactions post vaccine: Marland Kitchen Difficulty breathing  . Swelling of your face and throat  . A fast heartbeat  . A bad rash all over your body  . Dizziness and weakness    Immunizations Administered    Name Date Dose VIS Date Route   Pfizer COVID-19 Vaccine 03/15/2019  9:02 AM 0.3 mL 12/26/2018 Intramuscular   Manufacturer: [employer], [employer]   Lot: [id number]   NDC: [address]-9

## 2019-04-03 ENCOUNTER — Other Ambulatory Visit: Payer: Self-pay | Admitting: Cardiovascular Disease

## 2019-04-04 ENCOUNTER — Ambulatory Visit: Payer: Medicare HMO | Attending: Internal Medicine

## 2019-04-04 DIAGNOSIS — Z23 Encounter for immunization: Secondary | ICD-10-CM

## 2019-04-04 NOTE — Progress Notes (Signed)
   Covid-19 Vaccination Clinic  Name:  Laura Ferguson    MRN: [id number] DOB: 1932-05-14  04/04/2019  [name] was observed post Covid-19 immunization for 15 minutes without incident. She was provided with Vaccine Information Sheet and instruction to access the V-Safe system.   [name] was instructed to call 911 with any severe reactions post vaccine: Marland Kitchen Difficulty breathing  . Swelling of face and throat  . A fast heartbeat  . A bad rash all over body  . Dizziness and weakness   Immunizations Administered    Name Date Dose VIS Date Route   Pfizer COVID-19 Vaccine 04/04/2019  8:29 AM 0.3 mL 12/26/2018 Intramuscular   Manufacturer: [employer], [employer]   Lot: [id number]   NDC: [address]-6

## 2019-04-06 ENCOUNTER — Other Ambulatory Visit: Payer: Self-pay | Admitting: Cardiovascular Disease

## 2019-04-06 DIAGNOSIS — E785 Hyperlipidemia, unspecified: Secondary | ICD-10-CM

## 2019-04-08 ENCOUNTER — Ambulatory Visit: Payer: Self-pay

## 2019-05-08 ENCOUNTER — Other Ambulatory Visit: Payer: Self-pay | Admitting: Internal Medicine

## 2019-05-19 ENCOUNTER — Encounter: Payer: Self-pay | Admitting: Cardiovascular Disease

## 2019-05-19 ENCOUNTER — Ambulatory Visit (INDEPENDENT_AMBULATORY_CARE_PROVIDER_SITE_OTHER): Payer: Medicare HMO

## 2019-05-19 ENCOUNTER — Ambulatory Visit: Payer: Medicare HMO | Admitting: Cardiovascular Disease

## 2019-05-19 ENCOUNTER — Other Ambulatory Visit: Payer: Self-pay

## 2019-05-19 VITALS — Wt 132.0 lb

## 2019-05-19 DIAGNOSIS — E785 Hyperlipidemia, unspecified: Secondary | ICD-10-CM

## 2019-05-19 DIAGNOSIS — I2510 Atherosclerotic heart disease of native coronary artery without angina pectoris: Secondary | ICD-10-CM

## 2019-05-19 DIAGNOSIS — K219 Gastro-esophageal reflux disease without esophagitis: Secondary | ICD-10-CM

## 2019-05-19 DIAGNOSIS — Z794 Long term (current) use of insulin: Secondary | ICD-10-CM

## 2019-05-19 DIAGNOSIS — E1159 Type 2 diabetes mellitus with other circulatory complications: Secondary | ICD-10-CM

## 2019-05-19 DIAGNOSIS — Z0000 Encounter for general adult medical examination without abnormal findings: Secondary | ICD-10-CM

## 2019-05-19 DIAGNOSIS — I10 Essential (primary) hypertension: Secondary | ICD-10-CM | POA: Diagnosis not present

## 2019-05-19 DIAGNOSIS — Z951 Presence of aortocoronary bypass graft: Secondary | ICD-10-CM

## 2019-05-19 NOTE — Progress Notes (Signed)
PCP notes:  Health Maintenance: Prevnar 13- declined Dexa- declined Tdap- insurance/financial   Abnormal Screenings: none   Patient concerns: none   Nurse concerns: none   Next PCP appt.: none
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
## 2019-05-19 NOTE — Patient Instructions (Signed)

## 2019-05-19 NOTE — Patient Instructions (Signed)
Laura Ferguson , Thank you for taking time to come for your Medicare Wellness Visit. I appreciate your ongoing commitment to your health goals. Please review the following plan we discussed and let me know if I can assist you in the future.   Screening recommendations/referrals: Colonoscopy: no longer required Mammogram: no longer required Bone Density: declined Recommended yearly ophthalmology/optometry visit for glaucoma screening and checkup Recommended yearly dental visit for hygiene and checkup  Vaccinations: Influenza vaccine: Up to date, completed 10/16/2018 Pneumococcal vaccine: declined Tdap vaccine: decline Shingles vaccine: discussed    Advanced directives: Advance directive discussed with you today. Even though you declined this today please call our office should you change your mind and we can give you the proper paperwork for you to fill out.  Conditions/risks identified: diabetes, hypertension, hyperlipidemia  Next appointment: none   Preventive Care 65 Years and Older, Female Preventive care refers to lifestyle choices and visits with your health care provider that can promote health and wellness. What does preventive care include?  A yearly physical exam. This is also called an annual well check.  Dental exams once or twice a year.  Routine eye exams. Ask your health care provider how often you should have your eyes checked.  Personal lifestyle choices, including:  Daily care of your teeth and gums.  Regular physical activity.  Eating a healthy diet.  Avoiding tobacco and drug use.  Limiting alcohol use.  Practicing safe sex.  Taking low-dose aspirin every day.  Taking vitamin and mineral supplements as recommended by your health care provider. What happens during an annual well check? The services and screenings done by your health care provider during your annual well check will depend on your age, overall health, lifestyle risk factors, and family  history of disease. Counseling  Your health care provider may ask you questions about your:  Alcohol use.  Tobacco use.  Drug use.  Emotional well-being.  Home and relationship well-being.  Sexual activity.  Eating habits.  History of falls.  Memory and ability to understand (cognition).  Work and work Astronomer.  Reproductive health. Screening  You may have the following tests or measurements:  Height, weight, and BMI.  Blood pressure.  Lipid and cholesterol levels. These may be checked every 5 years, or more frequently if you are over 71 years old.  Skin check.  Lung cancer screening. You may have this screening every year starting at age 26 if you have a 30-pack-year history of smoking and currently smoke or have quit within the past 15 years.  Fecal occult blood test (FOBT) of the stool. You may have this test every year starting at age 8.  Flexible sigmoidoscopy or colonoscopy. You may have a sigmoidoscopy every 5 years or a colonoscopy every 10 years starting at age 77.  Hepatitis C blood test.  Hepatitis B blood test.  Sexually transmitted disease (STD) testing.  Diabetes screening. This is done by checking your blood sugar (glucose) after you have not eaten for a while (fasting). You may have this done every 1-3 years.  Bone density scan. This is done to screen for osteoporosis. You may have this done starting at age 17.  Mammogram. This may be done every 1-2 years. Talk to your health care provider about how often you should have regular mammograms. Talk with your health care provider about your test results, treatment options, and if necessary, the need for more tests. Vaccines  Your health care provider may recommend certain vaccines, such as:  Influenza vaccine. This is recommended every year.  Tetanus, diphtheria, and acellular pertussis (Tdap, Td) vaccine. You may need a Td booster every 10 years.  Zoster vaccine. You may need this after  age 76.  Pneumococcal 13-valent conjugate (PCV13) vaccine. One dose is recommended after age 1.  Pneumococcal polysaccharide (PPSV23) vaccine. One dose is recommended after age 64. Talk to your health care provider about which screenings and vaccines you need and how often you need them. This information is not intended to replace advice given to you by your health care provider. Make sure you discuss any questions you have with your health care provider. Document Released: 01/28/2015 Document Revised: 09/21/2015 Document Reviewed: 11/02/2014 Elsevier Interactive Patient Education  2017 [employer] Prevention in the Home Falls can cause injuries. They can happen to people of all ages. There are many things you can do to make your home safe and to help prevent falls. What can I do on the outside of my home?  Regularly fix the edges of walkways and driveways and fix any cracks.  Remove anything that might make you trip as you walk through a door, such as a raised step or threshold.  Trim any bushes or trees on the path to your home.  Use bright outdoor lighting.  Clear any walking paths of anything that might make someone trip, such as rocks or tools.  Regularly check to see if handrails are loose or broken. Make sure that both sides of any steps have handrails.  Any raised decks and porches should have guardrails on the edges.  Have any leaves, snow, or ice cleared regularly.  Use sand or salt on walking paths during winter.  Clean up any spills in your garage right away. This includes oil or grease spills. What can I do in the bathroom?  Use night lights.  Install grab bars by the toilet and in the tub and shower. Do not use towel bars as grab bars.  Use non-skid mats or decals in the tub or shower.  If you need to sit down in the shower, use a plastic, non-slip stool.  Keep the floor dry. Clean up any water that spills on the floor as soon as it  happens.  Remove soap buildup in the tub or shower regularly.  Attach bath mats securely with double-sided non-slip rug tape.  Do not have throw rugs and other things on the floor that can make you trip. What can I do in the bedroom?  Use night lights.  Make sure that you have a light by your bed that is easy to reach.  Do not use any sheets or blankets that are too big for your bed. They should not hang down onto the floor.  Have a firm chair that has side arms. You can use this for support while you get dressed.  Do not have throw rugs and other things on the floor that can make you trip. What can I do in the kitchen?  Clean up any spills right away.  Avoid walking on wet floors.  Keep items that you use a lot in easy-to-reach places.  If you need to reach something above you, use a strong step stool that has a grab bar.  Keep electrical cords out of the way.  Do not use floor polish or wax that makes floors slippery. If you must use wax, use non-skid floor wax.  Do not have throw rugs and other things on the floor that  can make you trip. What can I do with my stairs?  Do not leave any items on the stairs.  Make sure that there are handrails on both sides of the stairs and use them. Fix handrails that are broken or loose. Make sure that handrails are as long as the stairways.  Check any carpeting to make sure that it is firmly attached to the stairs. Fix any carpet that is loose or worn.  Avoid having throw rugs at the top or bottom of the stairs. If you do have throw rugs, attach them to the floor with carpet tape.  Make sure that you have a light switch at the top of the stairs and the bottom of the stairs. If you do not have them, ask someone to add them for you. What else can I do to help prevent falls?  Wear shoes that:  Do not have high heels.  Have rubber bottoms.  Are comfortable and fit you well.  Are closed at the toe. Do not wear sandals.  If you  use a stepladder:  Make sure that it is fully opened. Do not climb a closed stepladder.  Make sure that both sides of the stepladder are locked into place.  Ask someone to hold it for you, if possible.  Clearly mark and make sure that you can see:  Any grab bars or handrails.  First and last steps.  Where the edge of each step is.  Use tools that help you move around (mobility aids) if they are needed. These include:  Canes.  Walkers.  Scooters.  Crutches.  Turn on the lights when you go into a dark area. Replace any light bulbs as soon as they burn out.  Set up your furniture so you have a clear path. Avoid moving your furniture around.  If any of your floors are uneven, fix them.  If there are any pets around you, be aware of where they are.  Review your medicines with your doctor. Some medicines can make you feel dizzy. This can increase your chance of falling. Ask your doctor what other things that you can do to help prevent falls. This information is not intended to replace advice given to you by your health care provider. Make sure you discuss any questions you have with your health care provider. Document Released: 10/28/2008 Document Revised: 06/09/2015 Document Reviewed: 02/05/2014 Elsevier Interactive Patient Education  2017 [employer].

## 2019-05-19 NOTE — Progress Notes (Signed)
Subjective:   SHAKEENA KAFER is a 86 y.o. female who presents for an Initial Medicare Annual Wellness Visit.  Review of Systems: N/A      This visit is being conducted through telemedicine via telephone at the nurse health advisor's home address due to the COVID-19 pandemic. This patient has given me verbal consent via doximity to conduct this visit, patient states they are participating from their home address. Patient and myself are on the telephone call. There is no referral for this visit. Some vital signs may be absent or patient reported.    Patient identification: identified by name, DOB, and current address    Cardiac Risk Factors include: advanced age (>57mn, >>31women);diabetes mellitus;hypertension;dyslipidemia     Objective:    Today's Vitals   05/19/19 0940  Weight: 132 lb (59.9 kg)   Body mass index is 23.38 kg/m.  Advanced Directives 05/19/2019 11/05/2018 08/09/2016 06/08/2016 12/05/2014  Does Patient Have a Medical Advance Directive? No No No No No  Would patient like information on creating a medical advance directive? No - Patient declined No - Patient declined - No - Patient declined -    Current Medications (verified) Outpatient Encounter Medications as of 05/19/2019  Medication Sig  . ACCU-CHEK GUIDE test strip USE 1 STRIP THREE TIMES DAILY TO TEST BLOOD SUGAR  . Acetaminophen (TYLENOL 8 HOUR PO) Take 1 tablet by mouth every 8 (eight) hours as needed.   .Marland Kitchenaspirin 81 MG tablet Take 81 mg by mouth daily.  . Blood Glucose Monitoring Suppl (ACCU-CHEK GUIDE) w/Device KIT 1 Device by Does not apply route daily. Use to test blood sugar daily Dx code E11.65  . Insulin Pen Needle 32G X 4 MM MISC Use with insulin pen 4 times daily  . Lancets (ACCU-CHEK SOFT TOUCH) lancets Use as instructed to test blood sugar 3 times daily  . LEVEMIR FLEXTOUCH 100 UNIT/ML Pen INJECT 30 UNITS SUBCUTANEOUSLY IN AM and 15 UNITS at bedtime  . losartan (COZAAR) 50 MG tablet TAKE 1 & 1/2 (ONE  & ONE-HALF) TABLETS BY MOUTH ONCE DAILY  . metoprolol succinate (TOPROL-XL) 50 MG 24 hr tablet TAKE 1 TABLET (50 MG) BY MOUTH IN THE MORNING AND HALF A TABLET (25 MG) BY MOUTH IN THE EVENING  . Multiple Vitamin (MULTIVITAMIN) capsule Take 1 capsule by mouth daily.  .Marland KitchenNOVOLOG FLEXPEN 100 UNIT/ML FlexPen INJECT 6 TO 10 UNITS SUBCUTANEOUSLY THREE TIMES DAILY WITH MEALS  . pantoprazole (PROTONIX) 40 MG tablet Take 1 tablet by mouth once daily  . Polyethyl Glycol-Propyl Glycol (SYSTANE OP) Apply 2 drops to eye 2 (two) times daily.   . rosuvastatin (CRESTOR) 10 MG tablet Take 1 tablet by mouth once daily  . TRAVATAN Z 0.004 % SOLN ophthalmic solution Place 1 drop into both eyes at bedtime.   No facility-administered encounter medications on file as of 05/19/2019.    Allergies (verified) Patient has no known allergies.   History: Past Medical History:  Diagnosis Date  . CAD (coronary artery disease)   . History of nuclear stress test 11/22/2011   bruce myoview; normal pattern of perfusio; post-stress EF 76%; low risk scan  . Hyperlipidemia   . Hypertension   . 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 Type 2 diabetes mellitus ([place]    Past Surgical History:  Procedure Laterality Date  . CARDIAC CATHETERIZATION  05/07/2007  EF 55%, focal mild hypocontractility in mid-distal inferior wall & mid posterolateral wall; severe multivessel CAD - susequent CABGx6 (Dr. Corky Downs)  . CORONARY ARTERY BYPASS GRAFT  05/09/2007   LIMA to LAD, veing to intermediate; SVG to OM1 & OM2; SVG to acute marginal & distal RCA (Dr. Roxan Hockey)  . TRANSTHORACIC ECHOCARDIOGRAM  02/13/2010   EF =>55%, vigorous contraction EF 65%; LA mild-mod dilated; IV normal diameter - normal CVP; trace MR; mild TR; trace AV regurg   Family History  Problem Relation Age of Onset  . Cancer Mother   . Diabetes Sister   . Cancer Child    Social History    Socioeconomic History  . Marital status: Widowed    Spouse name: Not on file  . Number of children: 7  . Years of education: Not on file  . Highest education level: Not on file  Occupational History  . Not on file  Tobacco Use  . Smoking status: Never Smoker  . Smokeless tobacco: Never Used  . Tobacco comment: quit 1960's  smoked very lightly.  Substance and Sexual Activity  . Alcohol use: No    Alcohol/week: 0.0 standard drinks  . Drug use: No  . Sexual activity: Not on file  Other Topics Concern  . Not on file  Social History Narrative   Married.   Retired.   Works as a Building control surveyor.    Enjoys helping other.    Social Determinants of Health   Financial Resource Strain: Low Risk   . Difficulty of Paying Living Expenses: Not hard at all  Food Insecurity: No Food Insecurity  . Worried About Charity fundraiser in the Last Year: Never true  . Ran Out of Food in the Last Year: Never true  Transportation Needs: No Transportation Needs  . Lack of Transportation (Medical): No  . Lack of Transportation (Non-Medical): No  Physical Activity: Insufficiently Active  . Days of Exercise per Week: 2 days  . Minutes of Exercise per Session: 30 min  Stress: No Stress Concern Present  . Feeling of Stress : Not at all  Social Connections:   . Frequency of Communication with Friends and Family:   . Frequency of Social Gatherings with Friends and Family:   . Attends Religious Services:   . Active Member of Clubs or Organizations:   . Attends Archivist Meetings:   Marland Kitchen Marital Status:     Tobacco Counseling Counseling given: Not Answered Comment: quit 1960's  smoked very lightly.   Clinical Intake:  Pre-visit preparation completed: Yes  Pain : No/denies pain     Nutritional Risks: None Diabetes: Yes CBG done?: No Did pt. bring in CBG monitor from home?: No  How often do you need to have someone help you when you read instructions, pamphlets, or other written  materials from your doctor or pharmacy?: 1 - Never What is the last grade level you completed in school?: 12th  Interpreter Needed?: No  Information entered by :: CJohnson, LPN   Activities of Daily Living In your present state of health, do you have any difficulty performing the following activities: 05/19/2019  Hearing? Y  Comment has hearing aids, does not wear them  Vision? N  Difficulty concentrating or making decisions? N  Walking or climbing stairs? N  Dressing or bathing? N  Doing errands, shopping? N  Preparing Food and eating ? N  Using the Toilet? N  In the past six months, have you accidently leaked urine? N  Do you have problems with loss of bowel control? N  Managing your Medications? N  Managing your Finances? N  Housekeeping or managing your Housekeeping? N  Some recent data might be hidden     Immunizations and Health Maintenance Immunization History  Administered Date(s) Administered  . Fluad Quad(high Dose 65+) 10/16/2018  . Influenza,inj,Quad PF,6+ Mos 09/25/2017  . PFIZER SARS-COV-2 Vaccination 03/15/2019, 04/04/2019   Health Maintenance Due  Topic Date Due  . DEXA SCAN  Never done  . FOOT EXAM  09/29/2016    Patient Care Team: Pleas Koch, NP as PCP - General (Internal Medicine) Troy Sine, MD as PCP - Cardiology (Cardiology)  Indicate any recent Medical Services you may have received from other than [facility] providers in the past year (date may be approximate).     Assessment:   This is a routine wellness examination for [place].  Hearing/Vision screen  Hearing Screening   125Hz  250Hz  500Hz  1000Hz  2000Hz  3000Hz  4000Hz  6000Hz  8000Hz   Right ear:           Left ear:           Vision Screening Comments: Patient gets annual eye exams   Dietary issues and exercise activities discussed: Current Exercise Habits: Home exercise routine, Type of exercise: walking, Time (Minutes): 30, Frequency (Times/Week): 2, Weekly Exercise (Minutes/Week):  60, Intensity: Moderate, Exercise limited by: None identified  Goals    . Patient Stated     05/19/2019, I will continue to walk 2 days a week for about 30 minutes.      Depression Screen PHQ 2/9 Scores 05/19/2019 10/16/2017 06/08/2016  PHQ - 2 Score 2 1 1   PHQ- 9 Score 2 - -    Fall Risk Fall Risk  05/19/2019 10/16/2017 06/08/2016  Falls in the past year? 1 No No  Comment hanging curtains - -  Number falls in past yr: 0 - -  Injury with Fall? 0 - -  Risk for fall due to : Medication side effect - -  Follow up Falls evaluation completed;Falls prevention discussed - -    Is the patient's home free of loose throw rugs in walkways, pet beds, electrical cords, etc?   yes      Grab bars in the bathroom? no      Handrails on the stairs?   no      Adequate lighting?   yes  Timed Get Up and Go Performed: N/A  Cognitive Function: MMSE - Mini Mental State Exam 05/19/2019  Orientation to time 5  Orientation to Place 5  Registration 3  Attention/ Calculation 5  Recall 3  Language- repeat 1       Mini Cog  Mini-Cog screen was completed. Maximum score is 22. A value of 0 denotes this part of the MMSE was not completed or the patient failed this part of the Mini-Cog screening.  Screening Tests Health Maintenance  Topic Date Due  . DEXA SCAN  Never done  . FOOT EXAM  09/29/2016  . TETANUS/TDAP  05/18/2020 (Originally 06/13/1951)  . PNA vac Low Risk Adult (1 of 2 - PCV13) 05/19/2023 (Originally 06/14/1999)  . INFLUENZA VACCINE  08/16/2019  . HEMOGLOBIN A1C  08/17/2019  . OPHTHALMOLOGY EXAM  05/17/2020  . COVID-19 Vaccine  Completed    Qualifies for Shingles Vaccine: yes   Cancer Screenings: Lung: Low Dose CT Chest recommended if Age 33-80 years, 30 pack-year currently smoking OR have quit w/in 15 years. Patient does not qualify. Breast: Up  to date on Mammogram: no longer required   Up to date of Bone Density/Dexa: declined Colorectal: no longer required  Additional Screenings:    Hepatitis C Screening: N/A     Plan:   Patient will continue to walk about 2 days a week for 30 minutes.   I have personally reviewed and noted the following in the patient's chart:   . Medical and social history . Use of alcohol, tobacco or illicit drugs  . Current medications and supplements . Functional ability and status . Nutritional status . Physical activity . Advanced directives . List of other physicians . Hospitalizations, surgeries, and ER visits in previous 12 months . Vitals . Screenings to include cognitive, depression, and falls . Referrals and appointments  In addition, I have reviewed and discussed with patient certain preventive protocols, quality metrics, and best practice recommendations. A written personalized care plan for preventive services as well as general preventive health recommendations were provided to patient.     Andrez Grime, LPN   07/20/8830

## 2019-05-20 ENCOUNTER — Telehealth: Payer: Self-pay | Admitting: Primary Care

## 2019-05-20 NOTE — Telephone Encounter (Signed)
Patient called.  Patient wants Johny Drilling to call her back.  Patient scheduled appointment on 05/26/19.

## 2019-05-21 ENCOUNTER — Encounter: Payer: Self-pay | Admitting: Cardiovascular Disease

## 2019-05-22 NOTE — Telephone Encounter (Signed)
Patient says that Johny Drilling returned the call.

## 2019-05-26 ENCOUNTER — Ambulatory Visit: Payer: Medicare HMO | Admitting: Primary Care

## 2019-05-26 LAB — HM DIABETES EYE EXAM

## 2019-06-01 ENCOUNTER — Encounter: Payer: Self-pay | Admitting: Primary Care

## 2019-06-02 ENCOUNTER — Encounter: Payer: Self-pay | Admitting: Internal Medicine

## 2019-06-16 ENCOUNTER — Other Ambulatory Visit: Payer: Self-pay

## 2019-06-18 ENCOUNTER — Ambulatory Visit (INDEPENDENT_AMBULATORY_CARE_PROVIDER_SITE_OTHER): Payer: Medicare HMO | Admitting: Internal Medicine

## 2019-06-18 ENCOUNTER — Encounter: Payer: Self-pay | Admitting: Internal Medicine

## 2019-06-18 ENCOUNTER — Other Ambulatory Visit: Payer: Self-pay

## 2019-06-18 VITALS — BP 150/80 | HR 71 | Ht 63.0 in | Wt 134.0 lb

## 2019-06-18 DIAGNOSIS — E1159 Type 2 diabetes mellitus with other circulatory complications: Secondary | ICD-10-CM | POA: Diagnosis not present

## 2019-06-18 DIAGNOSIS — E785 Hyperlipidemia, unspecified: Secondary | ICD-10-CM | POA: Diagnosis not present

## 2019-06-18 DIAGNOSIS — Z794 Long term (current) use of insulin: Secondary | ICD-10-CM

## 2019-06-18 LAB — POCT GLYCOSYLATED HEMOGLOBIN (HGB A1C): Hemoglobin A1C: 8.5 % — AB (ref 4.0–5.6)

## 2019-06-18 MED ORDER — NOVOLOG FLEXPEN 100 UNIT/ML ~~LOC~~ SOPN: PEN_INJECTOR | SUBCUTANEOUS | 1 refills | Status: DC

## 2019-06-18 MED ORDER — ACCU-CHEK GUIDE W/DEVICE KIT: 1.0000 | PACK | Freq: Every day | 1 refills | Status: DC

## 2019-06-18 NOTE — Progress Notes (Signed)
Patient ID: ADAORA MCHANEY, female   DOB: 1932/11/16, 86 y.o.   MRN: [id number]   This visit occurred during the SARS-CoV-2 public health emergency.  Safety protocols were in place, including screening questions prior to the visit, additional usage of staff PPE, and extensive cleaning of exam room while observing appropriate contact time as indicated for disinfecting solutions.   HPI: MORNING HALBERG is a 86 y.o.-year-old female, returning for f/u for DM2, dx in 1990s, insulin-dependent for years, uncontrolled, with complications (CAD - s/p CABG x6, CKD, DR, PN). Last visit 4 months ago.  Reviewed HbA1c levels: Lab Results  Component Value Date   HGBA1C 7.9 (A) 02/17/2019   HGBA1C 8.4 (A) 10/16/2018   HGBA1C 8.8 (A) 06/16/2018   HGBA1C 8.9 (A) 01/29/2018   HGBA1C 9.4 (A) 10/15/2017   HGBA1C 8.5 04/18/2017   HGBA1C 9.1 01/17/2017   HGBA1C 8.8 10/16/2016   HGBA1C 11.7 (H) 04/02/2016   HGBA1C 10.0 (H) 01/03/2016   HGBA1C 9.5 (H) 09/30/2015   HGBA1C 16.2 (H) 06/27/2015   HGBA1C 14.3 (H) 03/21/2015   HGBA1C (H) 05/08/2007    9.6 (NOTE)   The ADA recommends the following therapeutic goals for glycemic   control related to Hgb A1C measurement:   Goal of Therapy:   < 7.0% Hgb A1C   Action Suggested:  > 8.0% Hgb A1C   Ref:  Diabetes Care, 22, Suppl. 1, 1999   HGBA1C (H) 05/07/2007    9.8 (NOTE)   The ADA recommends the following therapeutic goals for glycemic   control related to Hgb A1C measurement:   Goal of Therapy:   < 7.0% Hgb A1C   Action Suggested:  > 8.0% Hgb A1C   Ref:  Diabetes Care, 22, Suppl. 1, 1999   She was on: - Levemir 30 units in a.m. and 10 units at bedtime - Novolog 6-8, 10units before meals - may skip lunch, in which case she skips the insulin midday, also In the past, I advised her to add 4 to 5 units of NovoLog if she had a snack at night, but she did not do so.  Now on: - Levemir 30 units in a.m.  - NOT TAKING >> and 10 units at bedtime-added back 02/2019 - Novolog 10-12  >> 12 units 15 min before meals and she was also using the same dose for correction (!) >>  Advised not to take more than 5 units for correction after dinner  She checks sugars 3 times a day per her meter download but no values in her meter in last month -she is evasive about this, I cannot get a clear idea, whether she checked in the last month or not...: - am: 156-301 >> 129, 147-306, 451 >> 122, 132-293 >> 189-274 - 2h after b'fast: 64, 83-189, 207, 253 >> n/c  - before lunch: 77, 81, 163-287, 371 >> 103, 119-263 >> 184, 200-324, 387 - 2h after lunch: 64, 85-164, 236 >> 185 >> 277-304, 378 >> 93-176 - before dinner:  64, 82, 158-338 >> 106, 281-356 >> 128-386 - 2h after dinner: 100 >> 149-321 >> 86, 122-311 - bedtime: 217, 209 >> n/c >> 80-178  - nighttime: n/c >> 136-157 >> 118-176 >> 135-268, 312 Lowest sugar was 64 >> 56 >> 86 >> 59; it is unclear at which level she has hypoglycemia awareness. Highest sugar was 338 >> 451 >> 378 >> 300.  Glucometer: AccuChek  Pt's meals are: - Breakfast: oatmeal, grits, apple sauce - Lunch: tuna  sandwich, bologna - Dinner: soup, sandwich  -+ CKD, last BUN/creatinine:  Lab Results  Component Value Date   BUN 18 11/05/2018   BUN 23 09/01/2018   CREATININE 1.47 (H) 11/05/2018   CREATININE 1.50 (H) 09/01/2018  On losartan 75. -+ HL; last set of lipids: Lab Results  Component Value Date   CHOL 139 09/01/2018   HDL 72.30 09/01/2018   LDLCALC 36 09/01/2018   TRIG 152.0 (H) 09/01/2018   CHOLHDL 2 09/01/2018  On Crestor 10.  Cardiologist: Dr. Claiborne Billings.  - last eye exam was in 05/2019: + DR, she gets intraocular injections. Dr. Enis Slipper. - + numbness and tingling in her feet.  She also has HTN.  ROS: Constitutional: no weight gain/no weight loss, no fatigue, no subjective hyperthermia, no subjective hypothermia Eyes: no blurry vision, no xerophthalmia ENT: no sore throat, no nodules palpated in neck, no dysphagia, no odynophagia, no  hoarseness Cardiovascular: no CP/no SOB/no palpitations/no leg swelling Respiratory: no cough/no SOB/no wheezing Gastrointestinal: no N/no V/no D/no C/no acid reflux Musculoskeletal: no muscle aches/no joint aches Skin: no rashes, no hair loss Neurological: no tremors/+ numbness/+ tingling/no dizziness  I reviewed pt's medications, allergies, PMH, social hx, family hx, and changes were documented in the history of present illness. Otherwise, unchanged from my initial visit note.  Past Medical History:  Diagnosis Date  . CAD (coronary artery disease)   . History of nuclear stress test 11/22/2011   bruce myoview; normal pattern of perfusio; post-stress EF 76%; low risk scan  . Hyperlipidemia   . Hypertension   . 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 Type 2 diabetes mellitus ([place])    Past Surgical History:  Procedure Laterality Date  . CARDIAC CATHETERIZATION  05/07/2007   EF 55%, focal mild hypocontractility in mid-distal inferior wall & mid posterolateral wall; severe multivessel CAD - susequent CABGx6 (Dr. Corky Downs)  . CORONARY ARTERY BYPASS GRAFT  05/09/2007   LIMA to LAD, veing to intermediate; SVG to OM1 & OM2; SVG to acute marginal & distal RCA (Dr. Roxan Hockey)  . TRANSTHORACIC ECHOCARDIOGRAM  02/13/2010   EF =>55%, vigorous contraction EF 65%; LA mild-mod dilated; IV normal diameter - normal CVP; trace MR; mild TR; trace AV regurg   Social History   Social History  . Marital status: Widowed    Spouse name: N/A  . Number of children: 8   Occupational History  . homemaker   Social History Main Topics  . Smoking status: Never Smoker  . Smokeless tobacco: Never Used     Comment: quit 1960's  smoked very lightly.  . Alcohol use No  . Drug use: No     Social History Narrative   Married.   Retired.   Works as a Building control surveyor.    Enjoys helping other.    Current Outpatient Medications on  File Prior to Visit  Medication Sig Dispense Refill  . ACCU-CHEK GUIDE test strip USE 1 STRIP THREE TIMES DAILY TO TEST BLOOD SUGAR 100 each 11  . Acetaminophen (TYLENOL 8 HOUR PO) Take 1 tablet by mouth every 8 (eight) hours as needed.     Marland Kitchen aspirin 81 MG tablet Take 81 mg by mouth daily.    . Blood Glucose Monitoring Suppl (ACCU-CHEK GUIDE) w/Device KIT 1 Device by Does not apply route daily. Use to test blood sugar daily Dx code E11.65 1 kit 1  .  Insulin Pen Needle 32G X 4 MM MISC Use with insulin pen 4 times daily 300 each 11  . Lancets (ACCU-CHEK SOFT TOUCH) lancets Use as instructed to test blood sugar 3 times daily 100 each 5  . LEVEMIR FLEXTOUCH 100 UNIT/ML Pen INJECT 30 UNITS SUBCUTANEOUSLY IN AM and 15 UNITS at bedtime 30 mL 3  . losartan (COZAAR) 50 MG tablet TAKE 1 & 1/2 (ONE & ONE-HALF) TABLETS BY MOUTH ONCE DAILY 135 tablet 2  . metoprolol succinate (TOPROL-XL) 50 MG 24 hr tablet TAKE 1 TABLET (50 MG) BY MOUTH IN THE MORNING AND HALF A TABLET (25 MG) BY MOUTH IN THE EVENING 90 tablet 3  . Multiple Vitamin (MULTIVITAMIN) capsule Take 1 capsule by mouth daily.    Marland Kitchen NOVOLOG FLEXPEN 100 UNIT/ML FlexPen INJECT 6 TO 10 UNITS SUBCUTANEOUSLY THREE TIMES DAILY WITH MEALS 27 mL 1  . pantoprazole (PROTONIX) 40 MG tablet Take 1 tablet by mouth once daily 90 tablet 3  . Polyethyl Glycol-Propyl Glycol (SYSTANE OP) Apply 2 drops to eye 2 (two) times daily.     . prednisoLONE acetate (PRED FORTE) 1 % ophthalmic suspension     . rosuvastatin (CRESTOR) 10 MG tablet Take 1 tablet by mouth once daily 90 tablet 2  . TRAVATAN Z 0.004 % SOLN ophthalmic solution Place 1 drop into both eyes at bedtime.     No current facility-administered medications on file prior to visit.   No Known Allergies Family History  Problem Relation Age of Onset  . Cancer Mother   . Diabetes Sister   . Cancer Child    Pt has FH of DM in [place], sister - died from DM complications (refused to start HD), sons - 1 son died from  DM complications.  Her youngest son died Feb 12, 2017 (cancer). He was 53.  PE: BP (!) 150/80   Pulse 71   Ht _0  (1.6 m)   Wt 134 lb (60.8 kg)   SpO2 97%   BMI 23.74 kg/m  Wt Readings from Last 3 Encounters:  06/18/19 134 lb (60.8 kg)  05/19/19 136 lb (61.7 kg)  05/19/19 132 lb (59.9 kg)   Constitutional: normal weight, in NAD Eyes: PERRLA, EOMI, no exophthalmos ENT: moist mucous membranes, no thyromegaly, no cervical lymphadenopathy Cardiovascular: RRR, No MRG Respiratory: CTA B Gastrointestinal: abdomen soft, NT, ND, BS+ Musculoskeletal: no deformities, strength intact in all 4 Skin: moist, warm, no rashes Neurological: no tremor with outstretched hands, DTR normal in all 4  ASSESSMENT: 1. DM2, insulin-dependent, uncontrolled, with complications - CAD - s/p CABG x6 - CKD - DR - PN  2. HL  PLAN:  1. Patient with longstanding, uncontrolled, type 2 diabetes, on basal-bolus insulin regimen, with deterioration of diabetes control after the death of her son.  She was not checking sugars due to stress and grief.  She also started to drink sugary drinks.  HbA1c increased.  At last visits we discussed about the importance of avoiding sugary drinks and also to take the insulin consistently.  She was missing Levemir doses in the past.  At last visit, sugars were still high in the morning and, reviewing her insulin regimen she was not taking the recommended regimen.  We added 10 units of Levemir at bedtime.  She was also correcting high blood sugars during the day and after dinner with the same amount of NovoLog that she was taking for meals, and I advised her that this is dangerous.  Indeed, she was dropping her sugars  too low, to the 50s.  I advised her to correct with no more than 5 units. -At this visit, reviewing her meter downloads, she does not have sugars in her meter in the last month.  It is unclear whether she was actually taking them, as she is evasive about this.  However, the  sugars checked are quite fluctuating, mostly high.  I do not think that she is consistent in taking her insulin doses.  She also stays up late and has inconsistent eating times.  For now, since sugars are high around 10 AM to 12 PM, will increase her Levemir at bedtime.  I also advised her to increase the NovoLog to 15 units most of the time, maybe with the exception of the first meal of the day.  Since she is still correcting hyperglycemia with up to 12 units of insulin, I again underlined the importance of not over correcting and advised her to only use up to 5 units. - I suggested to:  Patient Instructions  Please increase: - Levemir 30 units in a.m. and 15 units units at bedtime - Novolog 12-15 units 15 min before meals  DO NOT CORRECT the blood sugars after meals, and then, only use up to 5 units.  Please return in 3-4 months with your sugar log.  - we checked her HbA1c: 8.5% (higher) - advised to check sugars at different times of the day - 3x a day, rotating check times - advised for yearly eye exams >> she is UTD - return to clinic in 3-4 months    2. HL -Reviewed latest lipid panel from 08/2018: Fractions at goal with exception of a slightly high triglyceride level: Lab Results  Component Value Date   CHOL 139 09/01/2018   HDL 72.30 09/01/2018   LDLCALC 36 09/01/2018   TRIG 152.0 (H) 09/01/2018   CHOLHDL 2 09/01/2018  -Continues Crestor without side effects   Philemon Kingdom, MD PhD [facility] Endocrin[facility]

## 2019-06-18 NOTE — Patient Instructions (Addendum)
Please increase: - Levemir 30 units in a.m. and 15 units units at bedtime - Novolog 12-15 units 15 min before meals  DO NOT CORRECT the blood sugars after meals, and then, only use up to 5 units.  Please return in 3-4 months with your sugar log.

## 2019-06-18 NOTE — Addendum Note (Signed)
Addended by: Darliss Ridgel I on: 06/18/2019 02:31 PM   Modules accepted: Orders

## 2019-07-21 ENCOUNTER — Other Ambulatory Visit: Payer: Self-pay | Admitting: Otolaryngology

## 2019-07-21 DIAGNOSIS — R519 Headache, unspecified: Secondary | ICD-10-CM

## 2019-07-21 DIAGNOSIS — R42 Dizziness and giddiness: Secondary | ICD-10-CM

## 2019-07-23 ENCOUNTER — Other Ambulatory Visit: Payer: Self-pay | Admitting: Internal Medicine

## 2019-07-23 DIAGNOSIS — E119 Type 2 diabetes mellitus without complications: Secondary | ICD-10-CM

## 2019-08-05 ENCOUNTER — Ambulatory Visit
Admission: RE | Admit: 2019-08-05 | Discharge: 2019-08-05 | Disposition: A | Discharge status: Home / Self Care | Payer: Medicare HMO | Source: Ambulatory Visit | Attending: Otolaryngology | Admitting: Otolaryngology

## 2019-08-05 ENCOUNTER — Other Ambulatory Visit: Payer: Self-pay

## 2019-08-05 DIAGNOSIS — R519 Headache, unspecified: Secondary | ICD-10-CM | POA: Insufficient documentation

## 2019-08-05 DIAGNOSIS — R42 Dizziness and giddiness: Secondary | ICD-10-CM

## 2019-08-05 MED ORDER — GADOBUTROL 1 MMOL/ML IV SOLN
6.0000 mL | Freq: Once | INTRAVENOUS | Status: AC | PRN
  Administered 2019-08-05: 6 mL via INTRAVENOUS

## 2019-08-10 ENCOUNTER — Other Ambulatory Visit: Payer: Self-pay | Admitting: Internal Medicine

## 2019-08-10 ENCOUNTER — Telehealth: Payer: Self-pay | Admitting: *Deleted

## 2019-08-10 NOTE — Telephone Encounter (Signed)
Patient left a voicemail stating that she has some concerns and requested a call back. Call patient and left a voicemail for her to call the office back.

## 2019-08-11 NOTE — Telephone Encounter (Signed)
Left another message for patient to call back. Advised patient that we are returning her call.

## 2019-08-11 NOTE — Telephone Encounter (Signed)
Spoke to patient and was advised that she was just calling to schedule her appointments and that has been taking care of.

## 2019-08-12 ENCOUNTER — Other Ambulatory Visit: Payer: Self-pay | Admitting: Primary Care

## 2019-08-12 DIAGNOSIS — E785 Hyperlipidemia, unspecified: Secondary | ICD-10-CM

## 2019-08-12 DIAGNOSIS — I10 Essential (primary) hypertension: Secondary | ICD-10-CM

## 2019-08-18 ENCOUNTER — Other Ambulatory Visit: Payer: Medicare HMO

## 2019-08-26 ENCOUNTER — Telehealth: Payer: Self-pay

## 2019-08-26 NOTE — Telephone Encounter (Signed)
Agree. Thanks

## 2019-08-26 NOTE — Telephone Encounter (Signed)
Pt said that she lost her son to death one week ago; pt has another son that is very ill and not sure if he is going to make it. Pt has been under a lot of stress. Pt said today BP 100/59 P 60. Pt  Said recently FBS in 170s and after eating BS goes to 300s. Pt taking levemir and novalog which is prescribed by Dr Elvera Lennox. Pt has been going to ENT for dizziness and had MRI last month and pt thinks the dizziness is better. Pt said she is not having any CP but has a funny feeling under lt breast but pt thinks it is acid  Reflux; pt is not having SOB or H/A. Pt has no covid symptoms and pt had Pfizer covid vaccine on 03/15/19 and 04/04/19. Pt does not have any med for stress or anxiety. Pt scheduled in office appt to see Dr Para March on 08/27/19 at 12:00 noon. UC & ED precautions given and pt voiced understanding. FYI to Dr Para March and Allayne Gitelman NP as PCP.

## 2019-08-27 ENCOUNTER — Ambulatory Visit (INDEPENDENT_AMBULATORY_CARE_PROVIDER_SITE_OTHER): Payer: Medicare HMO | Admitting: Family Medicine

## 2019-08-27 ENCOUNTER — Encounter: Payer: Self-pay | Admitting: Family Medicine

## 2019-08-27 ENCOUNTER — Other Ambulatory Visit: Payer: Self-pay

## 2019-08-27 DIAGNOSIS — Z659 Problem related to unspecified psychosocial circumstances: Secondary | ICD-10-CM

## 2019-08-27 DIAGNOSIS — Z634 Disappearance and death of family member: Secondary | ICD-10-CM | POA: Diagnosis not present

## 2019-08-27 NOTE — Patient Instructions (Signed)
Please update me as needed.  Take care.  Glad to see you. If you want to get set up with counseling then let me know.  I'll be thinking about you.

## 2019-08-27 NOTE — Progress Notes (Signed)
This visit occurred during the SARS-CoV-2 public health emergency.  Safety protocols were in place, including screening questions prior to the visit, additional usage of staff PPE, and extensive cleaning of exam room while observing appropriate contact time as indicated for disinfecting solutions.  Sig stressors d/w pt.  One son recently died, another son very ill in the hospital.  She has lost multiple children from cancer.  She was with her cousin- as her cousin died- 2.5 weeks ago.  She lives along with other kids nearby.  First husband died a few year ago.  She was remarried then divorced.  Her other kids are helping out but patient is "independent."  She isn't sleeping well.    Her vertigo is better.  Her BP is lower on home checks recently. D/w pt.    Meds, vitals, and allergies reviewed.   ROS: Per HPI unless specifically indicated in ROS section   GEN: nad, alert and oriented HEENT: ncat NECK: supple w/o LA CV: rrr.   PULM: ctab, no inc wob ABD: soft, +bs EXT: Well-perfused.  No tremor.  Speech fluent.  Judgment intact. She appeared sad when discussing recent events but regained her composure.

## 2019-08-31 DIAGNOSIS — Z659 Problem related to unspecified psychosocial circumstances: Secondary | ICD-10-CM | POA: Insufficient documentation

## 2019-08-31 NOTE — Assessment & Plan Note (Addendum)
Significant social upheaval noted.  No suicidal homicidal intent.  Still okay for outpatient follow-up.  We discussed options.  She did not want start medication at this point.  We also talked about counseling.  She is going to consider that.  She said she felt better after talking about her situation at the office visit today, which is useful.  I encouraged her to think about counseling and then update [place] as needed.  I told her I would be thinking about her.  Glad to see this pleasant lady in clinic. At least 20 minutes were devoted to patient care in this encounter (this can potentially include time spent reviewing the patient's file/history, interviewing and examining the patient, counseling/reviewing plan with patient, ordering referrals, ordering tests, reviewing relevant laboratory or x-ray data, and documenting the encounter).

## 2019-09-07 ENCOUNTER — Other Ambulatory Visit (INDEPENDENT_AMBULATORY_CARE_PROVIDER_SITE_OTHER): Payer: Medicare HMO

## 2019-09-07 ENCOUNTER — Other Ambulatory Visit: Payer: Self-pay

## 2019-09-07 DIAGNOSIS — E785 Hyperlipidemia, unspecified: Secondary | ICD-10-CM | POA: Diagnosis not present

## 2019-09-07 DIAGNOSIS — I10 Essential (primary) hypertension: Secondary | ICD-10-CM

## 2019-09-07 LAB — COMPREHENSIVE METABOLIC PANEL WITH GFR
ALT: 15 U/L (ref 0–35)
AST: 16 U/L (ref 0–37)
Albumin: 3.9 g/dL (ref 3.5–5.2)
Alkaline Phosphatase: 65 U/L (ref 39–117)
BUN: 31 mg/dL — ABNORMAL HIGH (ref 6–23)
CO2: 25 meq/L (ref 19–32)
Calcium: 9.4 mg/dL (ref 8.4–10.5)
Chloride: 110 meq/L (ref 96–112)
Creatinine, Ser: 1.25 mg/dL — ABNORMAL HIGH (ref 0.40–1.20)
GFR: 49.26 mL/min — ABNORMAL LOW
Glucose, Bld: 83 mg/dL (ref 70–99)
Potassium: 3.5 meq/L (ref 3.5–5.1)
Sodium: 144 meq/L (ref 135–145)
Total Bilirubin: 0.5 mg/dL (ref 0.2–1.2)
Total Protein: 6.9 g/dL (ref 6.0–8.3)

## 2019-09-07 LAB — LIPID PANEL
Cholesterol: 134 mg/dL (ref 0–200)
HDL: 61.6 mg/dL
LDL Cholesterol: 55 mg/dL (ref 0–99)
NonHDL: 72.75
Total CHOL/HDL Ratio: 2
Triglycerides: 90 mg/dL (ref 0.0–149.0)
VLDL: 18 mg/dL (ref 0.0–40.0)

## 2019-09-14 ENCOUNTER — Encounter: Payer: Medicare HMO | Admitting: Primary Care

## 2019-09-15 ENCOUNTER — Ambulatory Visit: Payer: Medicare HMO | Attending: Internal Medicine

## 2019-09-15 DIAGNOSIS — Z23 Encounter for immunization: Secondary | ICD-10-CM

## 2019-09-15 NOTE — Progress Notes (Signed)
   Covid-19 Vaccination Clinic  Name:  Ayden Apodaca    MRN: [id number] DOB: May 31, 1932  09/15/2019  Ms. Barnfield was observed post Covid-19 immunization for 15 minutes without incident. She was provided with Vaccine Information Sheet and instruction to access the V-Safe system.   Ms. Dave was instructed to call 911 with any severe reactions post vaccine: Marland Kitchen Difficulty breathing  . Swelling of face and throat  . A fast heartbeat  . A bad rash all over body  . Dizziness and weakness

## 2019-09-22 ENCOUNTER — Encounter: Payer: Self-pay | Admitting: Primary Care

## 2019-09-22 ENCOUNTER — Other Ambulatory Visit: Payer: Self-pay

## 2019-09-22 ENCOUNTER — Ambulatory Visit (INDEPENDENT_AMBULATORY_CARE_PROVIDER_SITE_OTHER): Payer: Medicare HMO | Admitting: Primary Care

## 2019-09-22 DIAGNOSIS — Z659 Problem related to unspecified psychosocial circumstances: Secondary | ICD-10-CM

## 2019-09-22 DIAGNOSIS — F3289 Other specified depressive episodes: Secondary | ICD-10-CM | POA: Diagnosis not present

## 2019-09-22 DIAGNOSIS — Z634 Disappearance and death of family member: Secondary | ICD-10-CM

## 2019-09-22 MED ORDER — SERTRALINE HCL 25 MG PO TABS: 25.0000 mg | ORAL_TABLET | Freq: Every day | ORAL | 1 refills | Status: DC

## 2019-09-22 NOTE — Progress Notes (Signed)
Subjective:    Patient ID: Laura Ferguson, female    DOB: January 05, 1933, 86 y.o.   MRN: [id number]  HPI  This visit occurred during the SARS-CoV-2 public health emergency.  Safety protocols were in place, including screening questions prior to the visit, additional usage of staff PPE, and extensive cleaning of exam room while observing appropriate contact time as indicated for disinfecting solutions.   [name] is a 86 year old female with a history of hypertension, CAD, type 2 diabetes, GAD who presents today to discuss grief and depression.  She's recently lost two of her sons within the last month, one un expectantly and the other to Covid-19 infection. She's actually lost a total of four sons over her life and she's having a really hard time coping.   Symptoms include tearfulness, motivation, difficulty sleeping, feeling sad/down. She was evaluated by Dr. Damita Dunnings in mid August 2021 for same symptoms, no treatment provided as she was going to think about options and update. PHQ 9 score of 17 today.   Today she does not wish to meet with therapy.   BP Readings from Last 3 Encounters:  09/22/19 138/72  08/27/19 138/68  06/18/19 (!) 150/80     Review of Systems  Constitutional: Positive for fatigue.  Cardiovascular: Negative for chest pain.  Psychiatric/Behavioral: Positive for sleep disturbance.       See HPI       Past Medical History:  Diagnosis Date  . CAD (coronary artery disease)   . History of nuclear stress test 11/22/2011   bruce myoview; normal pattern of perfusio; post-stress EF 76%; low risk scan  . Hyperlipidemia   . Hypertension   . 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 Type 2 diabetes mellitus ([place])      Social History   Socioeconomic History  . Marital status: Widowed    Spouse name: Not on file  . Number of children: 7  . Years of education: Not on file  . Highest  education level: Not on file  Occupational History  . Not on file  Tobacco Use  . Smoking status: Never Smoker  . Smokeless tobacco: Never Used  . Tobacco comment: quit 1960's  smoked very lightly.  Substance and Sexual Activity  . Alcohol use: No    Alcohol/week: 0.0 standard drinks  . Drug use: No  . Sexual activity: Not on file  Other Topics Concern  . Not on file  Social History Narrative   Married.   Retired.   Works as a Building control surveyor.    Enjoys helping other.    Social Determinants of Health   Financial Resource Strain: Low Risk   . Difficulty of Paying Living Expenses: Not hard at all  Food Insecurity: No Food Insecurity  . Worried About Charity fundraiser in the Last Year: Never true  . Ran Out of Food in the Last Year: Never true  Transportation Needs: No Transportation Needs  . Lack of Transportation (Medical): No  . Lack of Transportation (Non-Medical): No  Physical Activity: Insufficiently Active  . Days of Exercise per Week: 2 days  . Minutes of Exercise per Session: 30 min  Stress: No Stress Concern Present  . Feeling of Stress : Not at all  Social Connections:   . Frequency of Communication with Friends and Family: Not on file  . Frequency of  Social Gatherings with Friends and Family: Not on file  . Attends Religious Services: Not on file  . Active Member of Clubs or Organizations: Not on file  . Attends Archivist Meetings: Not on file  . Marital Status: Not on file  Intimate Partner Violence: Not At Risk  . Fear of Current or Ex-Partner: No  . Emotionally Abused: No  . Physically Abused: No  . Sexually Abused: No    Past Surgical History:  Procedure Laterality Date  . CARDIAC CATHETERIZATION  05/07/2007   EF 55%, focal mild hypocontractility in mid-distal inferior wall & mid posterolateral wall; severe multivessel CAD - susequent CABGx6 (Dr. Corky Downs)  . CORONARY ARTERY BYPASS GRAFT  05/09/2007   LIMA to LAD, veing to intermediate; SVG to  OM1 & OM2; SVG to acute marginal & distal RCA (Dr. Roxan Hockey)  . TRANSTHORACIC ECHOCARDIOGRAM  02/13/2010   EF =>55%, vigorous contraction EF 65%; LA mild-mod dilated; IV normal diameter - normal CVP; trace MR; mild TR; trace AV regurg    Family History  Problem Relation Age of Onset  . Cancer Mother   . Diabetes Sister   . Cancer Child     No Active Allergies  Current Outpatient Medications on File Prior to Visit  Medication Sig Dispense Refill  . ACCU-CHEK GUIDE test strip USE TO TEST BLOOD SUGAR THREE TIMES DAILY 300 each 11  . Acetaminophen (TYLENOL 8 HOUR PO) Take 1 tablet by mouth every 8 (eight) hours as needed.     Marland Kitchen aspirin 81 MG tablet Take 81 mg by mouth daily.    . Azelastine HCl 137 MCG/SPRAY SOLN As needed    . Blood Glucose Monitoring Suppl (ACCU-CHEK GUIDE) w/Device KIT 1 Device by Does not apply route daily. Use to test blood sugar daily Dx code E11.65 1 kit 1  . Insulin Pen Needle 32G X 4 MM MISC Use with insulin pen 4 times daily 300 each 11  . Lancets (ACCU-CHEK SOFT TOUCH) lancets Use as instructed to test blood sugar 3 times daily 100 each 5  . LEVEMIR FLEXTOUCH 100 UNIT/ML FlexPen INJECT 30 UNITS SUBCUTANEOUSLY IN THE MORNING AND 10 AT BEDTIME 30 mL 0  . losartan (COZAAR) 50 MG tablet TAKE 1 & 1/2 (ONE & ONE-HALF) TABLETS BY MOUTH ONCE DAILY 135 tablet 2  . metoprolol succinate (TOPROL-XL) 50 MG 24 hr tablet TAKE 1 TABLET (50 MG) BY MOUTH IN THE MORNING AND HALF A TABLET (25 MG) BY MOUTH IN THE EVENING 90 tablet 3  . Multiple Vitamin (MULTIVITAMIN) capsule Take 1 capsule by mouth daily.    Marland Kitchen NOVOLOG FLEXPEN 100 UNIT/ML FlexPen INJECT 12-15 UNITS SUBCUTANEOUSLY THREE TIMES DAILY WITH MEALS 27 mL 1  . pantoprazole (PROTONIX) 40 MG tablet Take 1 tablet by mouth once daily 90 tablet 3  . Polyethyl Glycol-Propyl Glycol (SYSTANE OP) Apply 2 drops to eye 2 (two) times daily.     . prednisoLONE acetate (PRED FORTE) 1 % ophthalmic suspension     . rosuvastatin (CRESTOR)  10 MG tablet Take 1 tablet by mouth once daily 90 tablet 2  . TRAVATAN Z 0.004 % SOLN ophthalmic solution Place 1 drop into both eyes at bedtime.     No current facility-administered medications on file prior to visit.    BP 138/72   Pulse 75   Ht 5' 3"  (1.6 m)   Wt 133 lb (60.3 kg)   SpO2 98%   BMI 23.56 kg/m    Objective:  Physical Exam Cardiovascular:     Rate and Rhythm: Normal rate and regular rhythm.  Pulmonary:     Effort: Pulmonary effort is normal.     Breath sounds: Normal breath sounds.  Musculoskeletal:     Cervical back: Neck supple.  Skin:    General: Skin is warm and dry.  Psychiatric:     Comments: Tearful during visit            Assessment & Plan:

## 2019-09-22 NOTE — Assessment & Plan Note (Signed)
Acute on chronic symptoms of depression, especially given recent loss of two sons. Discussed options for treatment, she kindly declines therapy which is understandable.  Rx for Zoloft 25 mg sent to pharmacy. Will add Melatonin HS PRN.   We discussed possible side effects of headache, GI upset, drowsiness, and SI/HI. If thoughts of SI/HI develop, we discussed to present to the emergency immediately. Patient verbalized understanding.   Follow up in 6 weeks for re-evaluation.

## 2019-09-22 NOTE — Patient Instructions (Signed)
Start sertraline (Zoloft) 25 mg once daily for depression and anxiety.   You can take Melatonin at bedtime for sleep. Do not exceed 10 mg of Melatonin in 24 hours.  Please schedule a follow up visit for 6 weeks for follow up of anxiety/depression.  It was a pleasure to see you today!

## 2019-09-23 ENCOUNTER — Encounter: Payer: Self-pay | Admitting: Internal Medicine

## 2019-09-23 ENCOUNTER — Ambulatory Visit: Payer: Medicare HMO | Admitting: Internal Medicine

## 2019-09-23 VITALS — BP 130/80 | HR 77 | Ht 63.0 in | Wt 134.0 lb

## 2019-09-23 DIAGNOSIS — Z794 Long term (current) use of insulin: Secondary | ICD-10-CM | POA: Diagnosis not present

## 2019-09-23 DIAGNOSIS — E1159 Type 2 diabetes mellitus with other circulatory complications: Secondary | ICD-10-CM

## 2019-09-23 DIAGNOSIS — E785 Hyperlipidemia, unspecified: Secondary | ICD-10-CM

## 2019-09-23 LAB — POCT GLYCOSYLATED HEMOGLOBIN (HGB A1C): Hemoglobin A1C: 8.1 % — AB (ref 4.0–5.6)

## 2019-09-23 NOTE — Addendum Note (Signed)
Addended by: Darliss Ridgel I on: 09/23/2019 11:17 AM   Modules accepted: Orders

## 2019-09-23 NOTE — Patient Instructions (Signed)
Please continue: - Levemir 30 units in a.m. and ALWAYS take 15 units units at bedtime  Increase: - Novolog 12-15 units 15 min before meals  DO NOT CORRECT the blood sugars after meals, and then, only use up to 5 units.  Please return in 3-4 months with your sugar log.

## 2019-09-23 NOTE — Progress Notes (Signed)
Patient ID: Laura Ferguson, female   DOB: 03/31/1932, 87 y.o.   MRN: [id number]   This visit occurred during the SARS-CoV-2 public health emergency.  Safety protocols were in place, including screening questions prior to the visit, additional usage of staff PPE, and extensive cleaning of exam room while observing appropriate contact time as indicated for disinfecting solutions.   HPI: Laura Ferguson is a 87 y.o.-year-old female, returning for f/u for DM2, dx in 1990s, insulin-dependent for years, uncontrolled, with complications (CAD - s/p CABG x6, CKD, DR, PN). Last visit 3 months ago.  Both of her sons died in last 2 weeks: one of Covid, one unexpectedly!   Reviewed HbA1c levels: Lab Results  Component Value Date   HGBA1C 8.5 (A) 06/18/2019   HGBA1C 7.9 (A) 02/17/2019   HGBA1C 8.4 (A) 10/16/2018   HGBA1C 8.8 (A) 06/16/2018   HGBA1C 8.9 (A) 01/29/2018   HGBA1C 9.4 (A) 10/15/2017   HGBA1C 8.5 04/18/2017   HGBA1C 9.1 01/17/2017   HGBA1C 8.8 10/16/2016   HGBA1C 11.7 (H) 04/02/2016   HGBA1C 10.0 (H) 01/03/2016   HGBA1C 9.5 (H) 09/30/2015   HGBA1C 16.2 (H) 06/27/2015   HGBA1C 14.3 (H) 03/21/2015   HGBA1C (H) 05/08/2007    9.6 (NOTE)   The ADA recommends the following therapeutic goals for glycemic   control related to Hgb A1C measurement:   Goal of Therapy:   < 7.0% Hgb A1C   Action Suggested:  > 8.0% Hgb A1C   Ref:  Diabetes Care, 22, Suppl. 1, 1999   HGBA1C (H) 05/07/2007    9.8 (NOTE)   The ADA recommends the following therapeutic goals for glycemic   control related to Hgb A1C measurement:   Goal of Therapy:   < 7.0% Hgb A1C   Action Suggested:  > 8.0% Hgb A1C   Ref:  Diabetes Care, 22, Suppl. 1, 1999   She is on: - Levemir 30 units in a.m. and 15 units units at bedtime (but skips it if sugars are at goal!!! - takes it maybe 2x a week) - Novolog 12-15 units 15 min before meals (but actually takes 12 units in am and 10 units before lunch and dinner)  She checks sugars 3 times a day per  her meter download: - am: 129, 147-306, 451 >> 122, 132-293 >> 189-274 >> 195-358 - 2h after b'fast: 64, 83-189, 207, 253 >> n/c >> 59, 88, 200, 266 - before lunch:  103, 119-263 >> 184, 200-324, 387 >> 170-290 - 2h after lunch: 185 >> 277-304, 378 >> 93-176 >> ? - before dinner:  106, 281-356 >> 128-386 >> 93-247 - 2h after dinner: 100 >> 149-321 >> 86, 122-311 >> 105, 235 - bedtime: 217, 209 >> n/c >> 80-178  - nighttime: n/c >> 136-157 >> 118-176 >> 135-268, 312 Lowest sugar was 56 >> 86 >> 59 >> 59; it is unclear at which level she has hypoglycemia awareness. Highest sugar was 451 >> 378 >> 300 >> 358.  Glucometer: AccuChek  Pt's meals are: - Breakfast: oatmeal, grits, apple sauce - Lunch: tuna sandwich, bologna - Dinner: soup, sandwich  -+ CKD, last BUN/creatinine:  Lab Results  Component Value Date   BUN 31 (H) 09/07/2019   BUN 18 11/05/2018   CREATININE 1.25 (H) 09/07/2019   CREATININE 1.47 (H) 11/05/2018  On losartan 75. -+ HL; last set of lipids: Lab Results  Component Value Date   CHOL 134 09/07/2019   HDL 61.60 09/07/2019   LDLCALC   55 09/07/2019   TRIG 90.0 09/07/2019   CHOLHDL 2 09/07/2019  On Crestor 10.  Cardiologist: Dr. Claiborne Billings.  - last eye exam was in 05/2019: + DR, she gets intraocular injections. Dr. Enis Slipper. - + numbness and tingling in her feet.  She also has HTN.  ROS: Constitutional: no weight gain/no weight loss, no fatigue, no subjective hyperthermia, no subjective hypothermia Eyes: no blurry vision, no xerophthalmia ENT: no sore throat, no nodules palpated in neck, no dysphagia, no odynophagia, no hoarseness Cardiovascular: no CP/no SOB/no palpitations/no leg swelling Respiratory: no cough/no SOB/no wheezing Gastrointestinal: no N/no V/no D/no C/no acid reflux Musculoskeletal: no muscle aches/no joint aches Skin: no rashes, no hair loss Neurological: no tremors/+ numbness/+ tingling/+ dizziness  I reviewed pt's medications, allergies, PMH,  social hx, family hx, and changes were documented in the history of present illness. Otherwise, unchanged from my initial visit note.  Past Medical History:  Diagnosis Date  . CAD (coronary artery disease)   . History of nuclear stress test 11/22/2011   bruce myoview; normal pattern of perfusio; post-stress EF 76%; low risk scan  . Hyperlipidemia   . Hypertension   . 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 Type 2 diabetes mellitus ([place])    Past Surgical History:  Procedure Laterality Date  . CARDIAC CATHETERIZATION  05/07/2007   EF 55%, focal mild hypocontractility in mid-distal inferior wall & mid posterolateral wall; severe multivessel CAD - susequent CABGx6 (Dr. Corky Downs)  . CORONARY ARTERY BYPASS GRAFT  05/09/2007   LIMA to LAD, veing to intermediate; SVG to OM1 & OM2; SVG to acute marginal & distal RCA (Dr. Roxan Hockey)  . TRANSTHORACIC ECHOCARDIOGRAM  02/13/2010   EF =>55%, vigorous contraction EF 65%; LA mild-mod dilated; IV normal diameter - normal CVP; trace MR; mild TR; trace AV regurg   Social History   Social History  . Marital status: Widowed    Spouse name: N/A  . Number of children: 8   Occupational History  . homemaker   Social History Main Topics  . Smoking status: Never Smoker  . Smokeless tobacco: Never Used     Comment: quit 1960's  smoked very lightly.  . Alcohol use No  . Drug use: No     Social History Narrative   Married.   Retired.   Works as a Building control surveyor.    Enjoys helping other.    Current Outpatient Medications on File Prior to Visit  Medication Sig Dispense Refill  . ACCU-CHEK GUIDE test strip USE TO TEST BLOOD SUGAR THREE TIMES DAILY 300 each 11  . Acetaminophen (TYLENOL 8 HOUR PO) Take 1 tablet by mouth every 8 (eight) hours as needed.     Marland Kitchen aspirin 81 MG tablet Take 81 mg by mouth daily.    . Azelastine HCl 137 MCG/SPRAY SOLN As needed    . Blood Glucose  Monitoring Suppl (ACCU-CHEK GUIDE) w/Device KIT 1 Device by Does not apply route daily. Use to test blood sugar daily Dx code E11.65 1 kit 1  . Insulin Pen Needle 32G X 4 MM MISC Use with insulin pen 4 times daily 300 each 11  . Lancets (ACCU-CHEK SOFT TOUCH) lancets Use as instructed to test blood sugar 3 times daily 100 each 5  . LEVEMIR FLEXTOUCH 100 UNIT/ML FlexPen INJECT 30 UNITS SUBCUTANEOUSLY IN THE MORNING AND 10 AT BEDTIME 30 mL 0  .  losartan (COZAAR) 50 MG tablet TAKE 1 & 1/2 (ONE & ONE-HALF) TABLETS BY MOUTH ONCE DAILY 135 tablet 2  . metoprolol succinate (TOPROL-XL) 50 MG 24 hr tablet TAKE 1 TABLET (50 MG) BY MOUTH IN THE MORNING AND HALF A TABLET (25 MG) BY MOUTH IN THE EVENING 90 tablet 3  . Multiple Vitamin (MULTIVITAMIN) capsule Take 1 capsule by mouth daily.    . NOVOLOG FLEXPEN 100 UNIT/ML FlexPen INJECT 12-15 UNITS SUBCUTANEOUSLY THREE TIMES DAILY WITH MEALS 27 mL 1  . pantoprazole (PROTONIX) 40 MG tablet Take 1 tablet by mouth once daily 90 tablet 3  . Polyethyl Glycol-Propyl Glycol (SYSTANE OP) Apply 2 drops to eye 2 (two) times daily.     . prednisoLONE acetate (PRED FORTE) 1 % ophthalmic suspension     . rosuvastatin (CRESTOR) 10 MG tablet Take 1 tablet by mouth once daily 90 tablet 2  . sertraline (ZOLOFT) 25 MG tablet Take 1 tablet (25 mg total) by mouth daily. For depression. 30 tablet 1  . TRAVATAN Z 0.004 % SOLN ophthalmic solution Place 1 drop into both eyes at bedtime.     No current facility-administered medications on file prior to visit.   No Active Allergies Family History  Problem Relation Age of Onset  . Cancer Mother   . Diabetes Sister   . Cancer Child    Pt has FH of DM in [place], sister - died from DM complications (refused to start HD), sons - 1 son died from DM complications.  Her youngest son died 01/2017 (cancer). He was 53.  PE: BP 130/80   Pulse 77   Ht 5' 3" (1.6 m)   Wt 134 lb (60.8 kg)   SpO2 98%   BMI 23.74 kg/m  Wt Readings from Last 3  Encounters:  09/23/19 134 lb (60.8 kg)  09/22/19 133 lb (60.3 kg)  08/27/19 134 lb 8 oz (61 kg)   Constitutional: normal weight, in NAD Eyes: PERRLA, EOMI, no exophthalmos ENT: moist mucous membranes, no thyromegaly, no cervical lymphadenopathy Cardiovascular: RRR, No MRG Respiratory: CTA B Gastrointestinal: abdomen soft, NT, ND, BS+ Musculoskeletal: no deformities, strength intact in all 4 Skin: moist, warm, no rashes Neurological: no tremor with outstretched hands, DTR normal in all 4  ASSESSMENT: 1. DM2, insulin-dependent, uncontrolled, with complications - CAD - s/p CABG x6 - CKD - DR - PN  2. HL  3. PN  PLAN:  1. Patient with longstanding, uncontrolled type 2 diabetes, on basal-bolus insulin regimen, with deterioration of diabetes control after her son's death.  She was not checking sugars due to stress increase.  She also started to drink sugary drinks.  HbA1c increased.  At last visit avoiding sugary drinks and also to take her insulin consistently.  At last visit, reviewing her meter downloads, she was not checking sugars in the previous month.  I strongly advised her to start checking consistently.  However, when she was actually checking, sugars were quite fluctuating, mostly high.  I did not feel that she was taking her insulin doses consistently, although she was evasive about this.  At that time we increase her Levemir at night and also NovoLog with meals.  Since she was still correcting hyperglycemia with to 12 units of insulin, I again underlined the importance of not over correcting and advised her to only use up to 5 units if absolutely needed. -At this visit, upon questioning, she is only taking Levemir night if the sugars are high, maybe twice a week.    We discussed that she needs to take this every night, even if the sugars are at goal.  I think this is the reason for her sugars being almost always in the 200s in the morning and even as high as 300s.  Also, she is not  taking a higher dose of NovoLog, as advised at last visit.  I again advised her to increase this.  However, I did advise her against correcting postprandial hyperglycemia with more than 5 units of NovoLog. -Since last visit, she had a low blood sugar at 69 after breakfast.  She feels that at that time she took insulin but did not eat.  I advised her that she always has to take NovoLog before a meal and not without eating. - I suggested to:  Patient Instructions  Please continue: - Levemir 30 units in a.m. and ALWAYS take 15 units units at bedtime  Increase: - Novolog 12-15 units 15 min before meals  DO NOT CORRECT the blood sugars after meals, and then, only use up to 5 units.  Please return in 3-4 months with your sugar log.  - we checked her HbA1c: 8.1% (a little better) - advised to check sugars at different times of the day - 3-4x a day, rotating check times - advised for yearly eye exams >> she is UTD - return to clinic in 3-4 months    2. HL -Reviewed latest lipid panel from 08/2019: All fractions excellent, at goal: Lab Results  Component Value Date   CHOL 134 09/07/2019   HDL 61.60 09/07/2019   LDLCALC 55 09/07/2019   TRIG 90.0 09/07/2019   CHOLHDL 2 09/07/2019  -Continues Crestor without side effects   Cristina Gherghe, MD PhD [facility]   

## 2019-10-13 ENCOUNTER — Encounter: Payer: Self-pay | Admitting: Primary Care

## 2019-10-13 ENCOUNTER — Ambulatory Visit (INDEPENDENT_AMBULATORY_CARE_PROVIDER_SITE_OTHER): Payer: Medicare HMO | Admitting: Primary Care

## 2019-10-13 ENCOUNTER — Ambulatory Visit: Payer: Medicare HMO | Attending: Otolaryngology

## 2019-10-13 ENCOUNTER — Other Ambulatory Visit: Payer: Self-pay

## 2019-10-13 VITALS — BP 127/74 | HR 70 | Temp 97.6°F | Ht 63.0 in | Wt 132.0 lb

## 2019-10-13 DIAGNOSIS — Z23 Encounter for immunization: Secondary | ICD-10-CM

## 2019-10-13 DIAGNOSIS — R42 Dizziness and giddiness: Secondary | ICD-10-CM | POA: Diagnosis not present

## 2019-10-13 DIAGNOSIS — F411 Generalized anxiety disorder: Secondary | ICD-10-CM

## 2019-10-13 DIAGNOSIS — Z794 Long term (current) use of insulin: Secondary | ICD-10-CM

## 2019-10-13 DIAGNOSIS — I2510 Atherosclerotic heart disease of native coronary artery without angina pectoris: Secondary | ICD-10-CM | POA: Diagnosis not present

## 2019-10-13 DIAGNOSIS — E1159 Type 2 diabetes mellitus with other circulatory complications: Secondary | ICD-10-CM

## 2019-10-13 DIAGNOSIS — I10 Essential (primary) hypertension: Secondary | ICD-10-CM

## 2019-10-13 DIAGNOSIS — Z0000 Encounter for general adult medical examination without abnormal findings: Secondary | ICD-10-CM

## 2019-10-13 DIAGNOSIS — E785 Hyperlipidemia, unspecified: Secondary | ICD-10-CM

## 2019-10-13 DIAGNOSIS — Z659 Problem related to unspecified psychosocial circumstances: Secondary | ICD-10-CM

## 2019-10-13 NOTE — Assessment & Plan Note (Signed)
Improved on sertraline 25 mg, continue same.

## 2019-10-13 NOTE — Therapy (Signed)
[facility] MAIN [facility] SERVICES [address] [place], [place], [address] Phone: [phone]   Fax:  [phone]  Physical Therapy Evaluation  Patient Details  Name: Laura Ferguson MRN: [id number] Date of Birth: 13-Feb-1932 Referring Provider (PT): Dr. Andee Poles   Encounter Date: 10/13/2019   PT End of Session - 10/13/19 1308    Visit Number 1    Number of Visits 9    Date for PT Re-Evaluation 12/08/19    Authorization Type eval: 10/13/19    PT Start Time 1100    PT Stop Time 1155    PT Time Calculation (min) 55 min    Activity Tolerance Patient tolerated treatment well    Behavior During Therapy [facility] for tasks assessed/performed           Past Medical History:  Diagnosis Date  . CAD (coronary artery disease)   . History of nuclear stress test 11/22/2011   bruce myoview; normal pattern of perfusio; post-stress EF 76%; low risk scan  . Hyperlipidemia   . Hypertension   . 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 Type 2 diabetes mellitus (HCC)     Past Surgical History:  Procedure Laterality Date  . CARDIAC CATHETERIZATION  05/07/2007   EF 55%, focal mild hypocontractility in mid-distal inferior wall & mid posterolateral wall; severe multivessel CAD - susequent CABGx6 (Dr. Bishop Limbo)  . CORONARY ARTERY BYPASS GRAFT  05/09/2007   LIMA to LAD, veing to intermediate; SVG to OM1 & OM2; SVG to acute marginal & distal RCA (Dr. Dorris Fetch)  . TRANSTHORACIC ECHOCARDIOGRAM  02/13/2010   EF =>55%, vigorous contraction EF 65%; LA mild-mod dilated; IV normal diameter - normal CVP; trace MR; mild TR; trace AV regurg    There were no vitals filed for this visit.    Subjective Assessment - 10/13/19 1100    Subjective Dizziness    Pertinent History Pt complaining of dizziness. She reports that she had vertigo one year ago but "Dr. Andee Poles straightened it out." Pt states that  last Tuesday she had a "really bad attack" and the room was spinning. She complains of her balance being poor as well. She reports "a couple falls" in the last 12 months. Brain MRI with IAC performed 08/05/19 and revealed mild cerebral atrophy, mild chronic microvascular ischemic changes and remot right occipital insult but no acute findings. She saw [facility] who performed VNG which showed abnormal central findings (abnormal smooth pursuit and saccades) as well as a 21% R caloric weakness. VNG also showed L horizontal beating nystagmus in the body L position and R horizontal nystagmus in body R position. She has been under considerable stress recently and reports that she has had two sons pass away in the last month. Pt has DM and reports that her BG has been very high recently. She has DM and reports diabetic retinopathy with impaired vision requiring bifocals.    Limitations Walking    Diagnostic tests see history    Patient Stated Goals Decrease dizziness    Currently in Pain? No/denies             VESTIBULAR AND BALANCE EVALUATION   HISTORY:  Subjective history of current problem: Pt complaining of dizziness. She reports that she had vertigo one year ago but "Dr. Andee Poles straightened it out." Pt states that last Tuesday she had a "really  bad attack" and the room was spinning. She complains of her balance being poor as well. She reports "a couple falls" in the last 12 months. Brain MRI with IAC performed 08/05/19 and revealed mild cerebral atrophy, mild chronic microvascular ischemic changes and remot right occipital insult but no acute findings. She saw [facility] who performed VNG which showed abnormal central findings (abnormal smooth pursuit and saccades) as well as a 21% R caloric weakness. VNG also showed L horizontal beating nystagmus in the body L position and R horizontal nystagmus in body R position. She has been under considerable stress recently and reports that she has had two sons  pass away in the last month. Pt has DM and reports that her BG has been very high recently. She has DM and reports diabetic retinopathy with impaired vision requiring bifocals.  Description of dizziness: (vertigo, unsteadiness, lightheadedness, falling, general unsteadiness, whoozy, swimmy-headed sensation, aural fullness) vertigo, unsteadiness, and nausea. No lightheadedness Frequency: Pt unsure Duration: Pt has difficulty answering. She reports that vertigo has lasted approximately 20 minutes. Symptom nature: (motion provoked, positional, spontaneous, constant, variable, intermittent) motion provoked it appears but again pt has difficulty answering clearly  Provocative Factors: Quick turns, laying back in bed and rolling over, medications Easing Factors: wait for it to pass  Progression of symptoms: (better, worse, no change since onset) unchanged History of similar episodes: Yes, pt had similar episode one year ago that was treated with CRT and was told that her vertigo was due to the "crystals in her ear." She reports complete resolution of her symptoms at that time.   Falls (yes/no): Yes Number of falls in past 6 months: "a couple"   Prior Functional Level: Fully independent with ADLs/IADLs, drives  Auditory complaints (tinnitus, pain, drainage, hearing loss, aural fullness): tinnitus, bilateral hearing loss, bilateral ear pain,  Vision (diplopia, visual field loss, recent changes, last eye exam): She has diabetic retinopathy. Wears bifocals for reading and driving. She reports some double vision in her R eye   Red Flags: (dysarthria, dysphagia, drop attacks, bowel and bladder changes, recent weight loss/gain) Pt reports some occasional dysphagia, pt reports that she feels like she has lost some weight recently    EXAMINATION  POSTURE: WNL  NEUROLOGICAL SCREEN: (2+ unless otherwise noted.) N=normal  Ab=abnormal  Level Dermatome R L Myotome R L Reflex R L  C3 Anterior Neck N N  Sidebend C2-3 N N Jaw CN V    C4 Top of Shoulder N N Shoulder Shrug C4 N N Hoffman's UMN    C5 Lateral Upper Arm N N Shoulder ABD C4-5 N N Biceps C5-6    C6 Lateral Arm/ Thumb N N Arm Flex/ Wrist Ext C5-6 N N Brachiorad. C5-6    C7 Middle Finger N N Arm Ext//Wrist Flex C6-7 N N Triceps C7    C8 4th & 5th Finger N N Flex/ Ext Carpi Ulnaris C8 N N Patellar (L3-4)    T1 Medial Arm N N Interossei T1 N N Gastrocnemius    L2 Medial thigh/groin N N Illiopsoas (L2-3) N N     L3 Lower thigh/med.knee N N Quadriceps (L3-4) N N     L4 Medial leg/lat thigh N N Tibialis Ant (L4-5) N N     L5 Lat. leg & dorsal foot N N EHL (L5) N N     S1 post/lat foot/thigh/leg N N Gastrocnemius (S1-2) N N     S2 Post./med. thigh & leg N N Hamstrings (L4-S3) N N  Cranial Nerves Vertical peripheral vision appears somewhat diminished (pt has history of DM retinopathy per her subjective report); Extraocular muscles are intact  Facial sensation is intact bilaterally  Facial strength is intact bilaterally  Hearing is diminished as tested by gross conversation Palate elevates midline, normal phonation  Shoulder shrug strength is intact  Tongue protrudes midline    SOMATOSENSORY:  Pt reports some chronic L arm tingling. She reports some bilateral foot numbness. Light touch sensation testing is normal throughout BUE/BLE    COORDINATION: Deferred   MUSCULOSKELETAL SCREEN: Cervical Spine ROM: WFL and painless in all planes. No gross deficits identified   ROM: WFL  MMT: WFL  Functional Mobility: Independent without assistive device.   POSTURAL CONTROL TESTS:   Clinical Test of Sensory Interaction for Balance    (CTSIB): Deferred   OCULOMOTOR / VESTIBULAR TESTING:  Oculomotor Exam- Room Light  Findings Comments  Ocular Alignment normal   Ocular ROM normal   Spontaneous Nystagmus normal   Gaze-Holding Nystagmus normal   End-Gaze Nystagmus normal   Vergence (normal 2-3") not examined   Smooth  Pursuit abnormal Extremely saccadic  Cross-Cover Test not examined   Saccades abnormal   VOR Cancellation abnormal Pt reports dizziness, no saccades noted  Left Head Impulse normal   Right Head Impulse normal   Static Acuity not examined   Dynamic Acuity not examined     Oculomotor Exam- Fixation Suppressed: Deferred BPPV TESTS:  Symptoms Duration Intensity Nystagmus  L Dix-Hallpike Vertigo <5s  A few downbeats?  R Dix-Hallpike Vertigo <5s  A few R horizontal (geotrophic beats)  L Head Roll Dizziness   None  R Head Roll Dizziness   None  L Sidelying Test      R Sidelying Test        FUNCTIONAL OUTCOME MEASURES   Results Comments  BERG Deferred   DGI Deferred   FGA Deferred   TUG Deferred   5TSTS Deferred   10 Meter Gait Speed Deferred   FOTO 80 Predicted improvement to 84  ABC Scale 86.9% WNL  DHI 14/100 Mild perception of handicap               [facility] PT Assessment - 10/13/19 1114      Assessment   Medical Diagnosis Dizziness    Referring Provider (PT) Dr. Andee Poles    Onset Date/Surgical Date 07/21/19    Hand Dominance Right    Next MD Visit Not reported    Prior Therapy Previously treated with CRT      Precautions   Precautions Fall      Restrictions   Weight Bearing Restrictions No      Balance Screen   Has the patient fallen in the past 6 months Yes    How many times? 2    Has the patient had a decrease in activity level because of a fear of falling?  No    Is the patient reluctant to leave their home because of a fear of falling?  No      Home Environment   Living Environment Private residence    Living Arrangements Alone    Available Help at Discharge Family    Type of Home House    Home Access Stairs to enter    Entrance Stairs-Number of Steps 4    Entrance Stairs-Rails Right    Home Layout One level      Prior Function   Level of Independence Independent      Cognition  Overall Cognitive Status No family/caregiver present to  determine baseline cognitive functioning                      Objective measurements completed on examination: See above findings.               PT Education - 10/13/19 1308    Education Details Plan of care    Person(s) Educated Patient    Methods Explanation    Comprehension Verbalized understanding            PT Short Term Goals - 10/13/19 1535      PT SHORT TERM GOAL #1   Title Pt will be independent with HEP in order to improve strength and balance in order to decrease fall risk and improve function at home and work.    Time 4    Period Weeks    Status New    Target Date 11/10/19             PT Long Term Goals - 10/13/19 1535      PT LONG TERM GOAL #1   Title Pt will decrease DHI score to 0/100 in order to demonstrate clinically significant reduction in disability    Baseline 10/13/19: 14/100    Time 8    Period Weeks    Status New    Target Date 12/08/19      PT LONG TERM GOAL #2   Title PT will improve FOTO score to at least 84 in order to demonstrate significant improvement in her function.    Baseline 10/13/19: 80    Time 8    Period Weeks    Status New    Target Date 12/08/19      PT LONG TERM GOAL #3   Title Pt will report no further episodes of vertigo when rolling in bed, turning quickly, or bending over    Time 8    Status New    Target Date 12/08/19                  Plan - 10/13/19 1308    Clinical Impression Statement Pt is a pleasant 86 year-old female referred for dizziness.  It is difficult to get a clear history from the patient and she struggles to provide information regarding aggravating/easing factors, frequency, and duration of her symptoms.  Patient believes that her dizziness may be due to medications and she also endorses uncontrolled blood glucose levels from her diabetes.  Patient also reports difficulty with her vision and states that she sometimes gets double vision in her right eye.  Based on her  report it sounds like she has a previous history of BPPV which was successfully treated with CRT. Her history does sound somewhat like BPPV however unable to reproduce patient's vertigo during canal testing today.  Therapist will continue to repeat canal testing at future visits.  Patient has central findings on the VNG as well as a 21% right caloric weakness.  Given patient's difficulty providing a history and need for assistance completing subjective forms today, therapist is unable to complete all objective outcome measures during evaluation.  Will complete additional testing at next session. Pt presents with deficits in dizziness and balance and will benefit from skilled PT services to decrease symptoms, improve balance, and decrease risk for future falls.    Personal Factors and Comorbidities Age;Comorbidity 3+    Comorbidities DM, CAD, HTN, GAD    Examination-Activity Limitations Reach Overhead  Examination-Participation Restrictions Psychologist, forensic;Shop    Stability/Clinical Decision Making Evolving/Moderate complexity    Clinical Decision Making Moderate    Rehab Potential Fair    PT Frequency 1x / week    PT Duration 8 weeks    PT Treatment/Interventions ADLs/Self Care Home Management;Aquatic Therapy;Biofeedback;Canalith Repostioning;Cryotherapy;Electrical Stimulation;Moist Heat;Traction;Ultrasound;DME Instruction;Gait training;Stair training;Functional mobility training;Therapeutic activities;Therapeutic exercise;Balance training;Neuromuscular re-education;Patient/family education;Manual techniques;Passive range of motion;Dry needling;Vestibular;Joint Manipulations    PT Next Visit Plan fixation suppression testing, mCTSIB, repeat BPPV tests, BERG, DGI/FGA, 57m gait speed, 5TSTS, TUG    PT Home Exercise Plan None currently.           Patient will benefit from skilled therapeutic intervention in order to improve the following deficits and impairments:  Decreased balance,  Dizziness  Visit Diagnosis: Dizziness and giddiness - Plan: PT plan of care cert/re-cert     Problem List Patient Active Problem List   Diagnosis Date Noted  . Preventative health care 10/13/2019  . Other social stressor 08/31/2019  . Acute left ankle pain 03/09/2019  . Acute pain of left foot 03/09/2019  . Diabetes mellitus (HCC) 01/29/2018  . Chronic back pain 11/12/2017  . GAD (generalized anxiety disorder) 10/16/2017  . Ankle swelling 06/01/2015  . Dizziness 03/26/2015  . CAD (coronary artery disease) 12/29/2012  . HTN (hypertension) 12/29/2012  . Hyperlipidemia with target LDL less than 70 12/29/2012   Lynnea Maizes PT, DPT, GCS  Prisilla Kocsis 10/13/2019, 3:39 PM  [facility] MAIN [facility] SERVICES [address] [place], [place], [address] Phone: [phone]   Fax:  [phone]  Name: Laura Ferguson MRN: [id number] Date of Birth: Nov 24, 1932

## 2019-10-13 NOTE — Progress Notes (Signed)
Subjective:    Patient ID: Laura Ferguson, female    DOB: July 10, 1932, 86 y.o.   MRN: [id number]  HPI  This visit occurred during the SARS-CoV-2 public health emergency.  Safety protocols were in place, including screening questions prior to the visit, additional usage of staff PPE, and extensive cleaning of exam room while observing appropriate contact time as indicated for disinfecting solutions.   Laura Ferguson is a 86 year old female who presents today for complete physical.  Immunizations: -Influenza: Due -Shingles: Never completed -Pneumonia: Never completed  -Covid-19: Completed series  Diet: She has a decreased appetite due to depression Exercise: She is walking  Eye exam: Follows regularly   Dental exam: No recent visit   Mammogram: Declines given age Dexa: Due, declines   Colonoscopy: N/A given age Hep C Screen: Negative  BP Readings from Last 3 Encounters:  10/13/19 127/74  09/23/19 130/80  09/22/19 138/72     Review of Systems  Constitutional: Negative for unexpected weight change.  HENT: Negative for rhinorrhea.   Respiratory: Negative for cough and shortness of breath.   Cardiovascular: Negative for chest pain.  Gastrointestinal: Negative for constipation and diarrhea.  Genitourinary: Negative for difficulty urinating.  Musculoskeletal: Positive for arthralgias.  Skin: Negative for rash.  Allergic/Immunologic: Positive for environmental allergies.  Neurological: Positive for dizziness. Negative for numbness and headaches.  Psychiatric/Behavioral:       Depression has somewhat improved since last visit.       Past Medical History:  Diagnosis Date  . CAD (coronary artery disease)   . History of nuclear stress test 11/22/2011   bruce myoview; normal pattern of perfusio; post-stress EF 76%; low risk scan  . Hyperlipidemia   . Hypertension   . 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 Type 2 diabetes mellitus (HCC)      Social History   Socioeconomic History  . Marital status: Widowed    Spouse name: Not on file  . Number of children: 7  . Years of education: Not on file  . Highest education level: Not on file  Occupational History  . Not on file  Tobacco Use  . Smoking status: Never Smoker  . Smokeless tobacco: Never Used  . Tobacco comment: quit 1960's  smoked very lightly.  Substance and Sexual Activity  . Alcohol use: No    Alcohol/week: 0.0 standard drinks  . Drug use: No  . Sexual activity: Not on file  Other Topics Concern  . Not on file  Social History Narrative   Married.   Retired.   Works as a Engineer, structural.    Enjoys helping other.    Social Determinants of Health   Financial Resource Strain: Low Risk   . Difficulty of Paying Living Expenses: Not hard at all  Food Insecurity: No Food Insecurity  . Worried About Programme researcher, broadcasting/film/video in the Last Year: Never true  . Ran Out of Food in the Last Year: Never true  Transportation Needs: No Transportation Needs  . Lack of Transportation (Medical): No  . Lack of Transportation (Non-Medical): No  Physical Activity: Insufficiently Active  . Days of Exercise per Week: 2 days  . Minutes of Exercise per Session: 30 min  Stress: No Stress Concern Present  . Feeling of Stress : Not at all  Social Connections:   . Frequency of Communication with  Friends and Family: Not on file  . Frequency of Social Gatherings with Friends and Family: Not on file  . Attends Religious Services: Not on file  . Active Member of Clubs or Organizations: Not on file  . Attends Banker Meetings: Not on file  . Marital Status: Not on file  Intimate Partner Violence: Not At Risk  . Fear of Current or Ex-Partner: No  . Emotionally Abused: No  . Physically Abused: No  . Sexually Abused: No    Past Surgical History:  Procedure Laterality Date  . CARDIAC CATHETERIZATION  05/07/2007   EF 55%, focal mild  hypocontractility in mid-distal inferior wall & mid posterolateral wall; severe multivessel CAD - susequent CABGx6 (Dr. Bishop Limbo)  . CORONARY ARTERY BYPASS GRAFT  05/09/2007   LIMA to LAD, veing to intermediate; SVG to OM1 & OM2; SVG to acute marginal & distal RCA (Dr. Dorris Fetch)  . TRANSTHORACIC ECHOCARDIOGRAM  02/13/2010   EF =>55%, vigorous contraction EF 65%; LA mild-mod dilated; IV normal diameter - normal CVP; trace MR; mild TR; trace AV regurg    Family History  Problem Relation Age of Onset  . Cancer Mother   . Diabetes Sister   . Cancer Child     No Known Allergies  Current Outpatient Medications on File Prior to Visit  Medication Sig Dispense Refill  . Acetaminophen (TYLENOL 8 HOUR PO) Take 1 tablet by mouth every 8 (eight) hours as needed.     Marland Kitchen aspirin 81 MG tablet Take 81 mg by mouth daily.    . Azelastine HCl 137 MCG/SPRAY SOLN As needed    . LEVEMIR FLEXTOUCH 100 UNIT/ML FlexPen INJECT 30 UNITS SUBCUTANEOUSLY IN THE MORNING AND 10 AT BEDTIME 30 mL 0  . losartan (COZAAR) 50 MG tablet TAKE 1 & 1/2 (ONE & ONE-HALF) TABLETS BY MOUTH ONCE DAILY 135 tablet 2  . metoprolol succinate (TOPROL-XL) 50 MG 24 hr tablet TAKE 1 TABLET (50 MG) BY MOUTH IN THE MORNING AND HALF A TABLET (25 MG) BY MOUTH IN THE EVENING 90 tablet 3  . Multiple Vitamin (MULTIVITAMIN) capsule Take 1 capsule by mouth daily.    Marland Kitchen NOVOLOG FLEXPEN 100 UNIT/ML FlexPen INJECT 12-15 UNITS SUBCUTANEOUSLY THREE TIMES DAILY WITH MEALS 27 mL 1  . pantoprazole (PROTONIX) 40 MG tablet Take 1 tablet by mouth once daily 90 tablet 3  . Polyethyl Glycol-Propyl Glycol (SYSTANE OP) Apply 2 drops to eye 2 (two) times daily.     . prednisoLONE acetate (PRED FORTE) 1 % ophthalmic suspension     . rosuvastatin (CRESTOR) 10 MG tablet Take 1 tablet by mouth once daily 90 tablet 2  . sertraline (ZOLOFT) 25 MG tablet Take 1 tablet (25 mg total) by mouth daily. For depression. 30 tablet 1  . TRAVATAN Z 0.004 % SOLN ophthalmic solution  Place 1 drop into both eyes at bedtime.     No current facility-administered medications on file prior to visit.    BP 127/74   Pulse 70   Temp 97.6 F (36.4 C) (Temporal)   Ht 5\' 3"  (1.6 m)   Wt 132 lb (59.9 kg)   SpO2 95%   BMI 23.38 kg/m    Objective:   Physical Exam HENT:     Right Ear: Tympanic membrane and ear canal normal.     Left Ear: Tympanic membrane and ear canal normal.  Eyes:     Pupils: Pupils are equal, round, and reactive to light.  Cardiovascular:  Rate and Rhythm: Normal rate and regular rhythm.  Pulmonary:     Effort: Pulmonary effort is normal.     Breath sounds: Normal breath sounds.  Abdominal:     General: Bowel sounds are normal.     Palpations: Abdomen is soft.     Tenderness: There is no abdominal tenderness.  Musculoskeletal:        General: Normal range of motion.     Cervical back: Neck supple.  Skin:    General: Skin is warm and dry.  Neurological:     Mental Status: She is alert and oriented to person, place, and time.     Cranial Nerves: No cranial nerve deficit.     Deep Tendon Reflexes:     Reflex Scores:      Patellar reflexes are 2+ on the right side and 2+ on the left side. Psychiatric:        Mood and Affect: Mood normal.     Comments: Mood improved compared to last visit. Laughing during visit today, more interactive.            Assessment & Plan:

## 2019-10-13 NOTE — Assessment & Plan Note (Signed)
Improved, following with endocrinology. Continue current regimen.

## 2019-10-13 NOTE — Assessment & Plan Note (Signed)
Recent LDL at goal, continue Crestor.  

## 2019-10-13 NOTE — Assessment & Plan Note (Signed)
Symptoms improved since last visit, she is more interactive today, also laughed once or twice during visit.  Continue sertraline 25 mg daily for now. Will call patient in 3 weeks to check on progress.

## 2019-10-13 NOTE — Assessment & Plan Note (Signed)
Chronic and continued, following with ENT who has prescribed physical therapy.

## 2019-10-13 NOTE — Assessment & Plan Note (Signed)
Well controlled in the office today. Continue losartan and metoprolol.

## 2019-10-13 NOTE — Assessment & Plan Note (Signed)
Asymptomatic, follows with cardiology. LDL at goal. Continue Crestor.

## 2019-10-13 NOTE — Assessment & Plan Note (Signed)
Prevnar and influenza vaccine provided today. Declines bone density screening. Declines mammogram and colonoscopy given age.  Encouraged a healthy diet, regular exercise. Exam today unremarkable. Labs reviewed.

## 2019-10-13 NOTE — Patient Instructions (Signed)
Continue to work on a healthy diet.  Be sure to stay active daily.  Continue taking sertraline (Zoloft) 25 mg daily for depression.  It was a pleasure to see you today!   Preventive Care 84 Years and Older, Female Preventive care refers to lifestyle choices and visits with your health care provider that can promote health and wellness. This includes:  A yearly physical exam. This is also called an annual well check.  Regular dental and eye exams.  Immunizations.  Screening for certain conditions.  Healthy lifestyle choices, such as diet and exercise. What can I expect for my preventive care visit? Physical exam Your health care provider will check:  Height and weight. These may be used to calculate body mass index (BMI), which is a measurement that tells if you are at a healthy weight.  Heart rate and blood pressure.  Your skin for abnormal spots. Counseling Your health care provider may ask you questions about:  Alcohol, tobacco, and drug use.  Emotional well-being.  Home and relationship well-being.  Sexual activity.  Eating habits.  History of falls.  Memory and ability to understand (cognition).  Work and work Statistician.  Pregnancy and menstrual history. What immunizations do I need?  Influenza (flu) vaccine  This is recommended every year. Tetanus, diphtheria, and pertussis (Tdap) vaccine  You may need a Td booster every 10 years. Varicella (chickenpox) vaccine  You may need this vaccine if you have not already been vaccinated. Zoster (shingles) vaccine  You may need this after age 84. Pneumococcal conjugate (PCV13) vaccine  One dose is recommended after age 84. Pneumococcal polysaccharide (PPSV23) vaccine  One dose is recommended after age 84. Measles, mumps, and rubella (MMR) vaccine  You may need at least one dose of MMR if you were born in 1957 or later. You may also need a second dose. Meningococcal conjugate (MenACWY) vaccine  You  may need this if you have certain conditions. Hepatitis A vaccine  You may need this if you have certain conditions or if you travel or work in places where you may be exposed to hepatitis A. Hepatitis B vaccine  You may need this if you have certain conditions or if you travel or work in places where you may be exposed to hepatitis B. Haemophilus influenzae type b (Hib) vaccine  You may need this if you have certain conditions. You may receive vaccines as individual doses or as more than one vaccine together in one shot (combination vaccines). Talk with your health care provider about the risks and benefits of combination vaccines. What tests do I need? Blood tests  Lipid and cholesterol levels. These may be checked every 5 years, or more frequently depending on your overall health.  Hepatitis C test.  Hepatitis B test. Screening  Lung cancer screening. You may have this screening every year starting at age 84 if you have a 30-pack-year history of smoking and currently smoke or have quit within the past 15 years.  Colorectal cancer screening. All adults should have this screening starting at age 84 and continuing until age 85. Your health care provider may recommend screening at age 84 if you are at increased risk. You will have tests every 1-10 years, depending on your results and the type of screening test.  Diabetes screening. This is done by checking your blood sugar (glucose) after you have not eaten for a while (fasting). You may have this done every 1-3 years.  Mammogram. This may be done every 1-2 years.  Talk with your health care provider about how often you should have regular mammograms.  BRCA-related cancer screening. This may be done if you have a family history of breast, ovarian, tubal, or peritoneal cancers. Other tests  Sexually transmitted disease (STD) testing.  Bone density scan. This is done to screen for osteoporosis. You may have this done starting at age  84. Follow these instructions at home: Eating and drinking  Eat a diet that includes fresh fruits and vegetables, whole grains, lean protein, and low-fat dairy products. Limit your intake of foods with high amounts of sugar, saturated fats, and salt.  Take vitamin and mineral supplements as recommended by your health care provider.  Do not drink alcohol if your health care provider tells you not to drink.  If you drink alcohol: ? Limit how much you have to 0-1 drink a day. ? Be aware of how much alcohol is in your drink. In the [place] one drink equals one 12 oz bottle of beer (355 mL), one 5 oz glass of wine (148 mL), or one 1 oz glass of hard liquor (44 mL). Lifestyle  Take daily care of your teeth and gums.  Stay active. Exercise for at least 30 minutes on 5 or more days each week.  Do not use any products that contain nicotine or tobacco, such as cigarettes, e-cigarettes, and chewing tobacco. If you need help quitting, ask your health care provider.  If you are sexually active, practice safe sex. Use a condom or other form of protection in order to prevent STIs (sexually transmitted infections).  Talk with your health care provider about taking a low-dose aspirin or statin. What's next?  Go to your health care provider once a year for a well check visit.  Ask your health care provider how often you should have your eyes and teeth checked.  Stay up to date on all vaccines. This information is not intended to replace advice given to you by your health care provider. Make sure you discuss any questions you have with your health care provider. Document Revised: 12/26/2017 Document Reviewed: 12/26/2017 Elsevier Patient Education  2020 [employer].

## 2019-10-15 NOTE — Addendum Note (Signed)
Addended by: Donnamarie Poag on: 10/15/2019 12:21 PM   Modules accepted: Orders

## 2019-10-27 ENCOUNTER — Encounter: Payer: Self-pay | Admitting: Physical Therapy

## 2019-10-27 ENCOUNTER — Other Ambulatory Visit: Payer: Self-pay | Admitting: Cardiovascular Disease

## 2019-10-27 ENCOUNTER — Ambulatory Visit: Payer: Medicare HMO | Attending: Otolaryngology | Admitting: Physical Therapy

## 2019-10-27 ENCOUNTER — Other Ambulatory Visit: Payer: Self-pay

## 2019-10-27 DIAGNOSIS — R42 Dizziness and giddiness: Secondary | ICD-10-CM | POA: Diagnosis present

## 2019-10-27 NOTE — Therapy (Signed)
[facility] MAIN [facility] SERVICES [address] [place], [place], [address] Phone: [phone]   Fax:  [phone]  Physical Therapy Treatment  Patient Details  Name: Laura Ferguson MRN: [id number] Date of Birth: [birth year] Referring Provider (PT): Dr. Andee Poles   Encounter Date: 10/27/2019   PT End of Session - 10/27/19 1042    Visit Number 2    Number of Visits 9    Date for PT Re-Evaluation 12/08/19    Authorization Type eval: 10/13/19    PT Start Time 1042    PT Stop Time 1130    PT Time Calculation (min) 48 min    Equipment Utilized During Treatment Gait belt    Activity Tolerance Patient tolerated treatment well    Behavior During Therapy [facility] for tasks assessed/performed           Past Medical History:  Diagnosis Date   CAD (coronary artery disease)    History of nuclear stress test 11/22/2011   bruce myoview; normal pattern of perfusio; post-stress EF 76%; low risk scan   Hyperlipidemia    Hypertension    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 Bishop Limbo)   CORONARY ARTERY BYPASS GRAFT  05/09/2007   LIMA to LAD, veing to intermediate; SVG to OM1 & OM2; SVG to acute marginal & distal RCA (Dr. Dorris Fetch)   TRANSTHORACIC ECHOCARDIOGRAM  02/13/2010   EF =>55%, vigorous contraction EF 65%; LA mild-mod dilated; IV normal diameter - normal CVP; trace MR; mild TR; trace AV regurg    There were no vitals filed for this visit.   Subjective Assessment - 10/27/19 1045    Subjective Patient states that she has been having slight dizziness this past week and states it is not as bad as it was  initially.  Patient reports she puts eye drops in her eyes each night and this makes her dizzy. Encouraged patient to follow-up with her eye doctor to report this issue.    Pertinent History Pt complaining of dizziness. She reports that she had vertigo one year ago but "Dr. Andee Poles straightened it out." Pt states that last Tuesday she had a "really bad attack" and the room was spinning. She complains of her balance being poor as well. She reports "a couple falls" in the last 12 months. Brain MRI with IAC performed 08/05/19 and revealed mild cerebral atrophy, mild chronic microvascular ischemic changes and remot right occipital insult but no acute findings. She saw [facility] who performed VNG which showed abnormal central findings (abnormal smooth pursuit and saccades) as well as a 21% R caloric weakness. VNG also showed L horizontal beating nystagmus in the body L position and R horizontal nystagmus in body R position. She has been under considerable stress recently and reports that she has had two sons pass away in the last month. Pt has DM and reports that her BG has been very high recently. She has DM and reports diabetic retinopathy with impaired vision requiring bifocals.    Limitations Walking    Diagnostic tests see history    Patient Stated Goals Decrease dizziness  [facility] PT Assessment - 10/27/19 1057      Standardized Balance Assessment   Standardized Balance Assessment Dynamic Gait Index      Dynamic Gait Index   Level Surface Mild Impairment    Change in Gait Speed Moderate Impairment    Gait with Horizontal Head Turns Mild Impairment    Gait with Vertical Head Turns Normal    Gait and Pivot Turn Mild Impairment    Step Over Obstacle Mild Impairment    Step Around Obstacles Mild Impairment    Steps Moderate Impairment    Total Score 15           Neuromuscular Re-education:  FUNCTIONAL OUTCOME MEASURES:  Results Comments  DGI 15/24 Falls risk; in need of  intervention  10 meter Walking Speed  0.45 M/sec Below average as compared to age and gender normative values; in need of intervention  Noted: patient with mild limp right LE with ambulation. Patient states she feels her hip (SI joint) is "out". Noted: Patient reports mild dizziness with right turning during the DGI test.      Clinical Test of Sensory Interaction for Balance (CTSIB): CONDITION TIME STRATEGY SWAY  Eyes open, firm surface 30 seconds ankle +1  Eyes closed, firm surface 30 seconds ankle +2  Eyes open, foam surface 30 seconds ankle +2  Eyes closed, foam surface 4 seconds Ankle, hip, stepping, reaching +4    Dix-Hallpike: Performed left and right Dix-Hallpike tests and both were negative with patient denying vertigo and no nystagmus observed. Patient reports mild dizziness lasting about 10 seconds both sides.  Patient reports that she had one bad episode of vertigo 2-3 weeks ago, but states she has not had any further episodes of vertigo, but is getting some dizziness.   Non-compliant/ Firm Surface: On firm surface, patient performed semi-tandem progressions with alternating lead leg with and without horizontal and vertical head turns with contact guard assistance.  Patient reports mild dizziness with head turns and states when she stopped moving, her dizziness increased to 6/10.   VOR X 1 exercise:  Demonstrated and educated as to VOR X1.  Patient performed VOR X 1 horizontal in sitting 2 reps of  30 seconds each and one rep of 15 seconds with verbal cues for technique.  Patient reports 5/10 dizziness with first rep, but reports increased with more reps. Issued VOR X 1 in sitting 3 reps of 30 seconds with plain background for home exercise program.   Patient scored 15/24 on the DGI this date which indicates falls risk. Patient ambulates at 0.45 M/sec on the 10 meter walking speed test which is below average as compared to age and gender normative values. Patient demonstrated  increased sway with feet together with eyes closed on firm surface and eyes open on foam surface. Patient only able to hold 4 seconds with feet together on foam with eyes closed. Patient's balance is challenged by uneven surfaces, narrow base of support, turning, single leg stance and eyes closed activities. Patient issued VOR X 1 in sitting with 30 second reps for home exercise program. Patient would benefit from PT services to further address functional deficits and goals as set on plan of care.     PT Education - 10/27/19 1232    Education Details Issued VOR X 1 in sitting 3 second reps for home exercise program    Person(s) Educated Patient    Methods Explanation;Demonstration;Verbal cues;Handout    Comprehension Verbalized understanding;Returned demonstration  PT Short Term Goals - 10/13/19 1535      PT SHORT TERM GOAL #1   Title Pt will be independent with HEP in order to improve strength and balance in order to decrease fall risk and improve function at home and work.    Time 4    Period Weeks    Status New    Target Date 11/10/19             PT Long Term Goals - 10/13/19 1535      PT LONG TERM GOAL #1   Title Pt will decrease DHI score to 0/100 in order to demonstrate clinically significant reduction in disability    Baseline 10/13/19: 14/100    Time 8    Period Weeks    Status New    Target Date 12/08/19      PT LONG TERM GOAL #2   Title PT will improve FOTO score to at least 84 in order to demonstrate significant improvement in her function.    Baseline 10/13/19: 80    Time 8    Period Weeks    Status New    Target Date 12/08/19      PT LONG TERM GOAL #3   Title Pt will report no further episodes of vertigo when rolling in bed, turning quickly, or bending over    Time 8    Status New    Target Date 12/08/19                 Plan - 10/27/19 1249    Clinical Impression Statement Patient scored 15/24 on the DGI this date which indicates falls  risk. Patient ambulates at 0.45 M/sec on the 10 meter walking speed test which is below average as compared to age and gender normative values. Patient demonstrated increased sway with feet together with eyes closed on firm surface and eyes open on foam surface. Patient only able to hold 4 seconds with feet together on foam with eyes closed. Patient's balance is challenged by uneven surfaces, narrow base of support, turning, single leg stance and eyes closed activities. Patient issued VOR X 1 in sitting with 30 second reps for home exercise program. Patient would benefit from PT services to further address functional deficits and goals as set on plan of care.    Personal Factors and Comorbidities Age;Comorbidity 3+    Comorbidities DM, CAD, HTN, GAD    Examination-Activity Limitations Reach Overhead    Examination-Participation Restrictions Psychologist, forensic;Shop    Stability/Clinical Decision Making Evolving/Moderate complexity    Rehab Potential Fair    PT Frequency 1x / week    PT Duration 8 weeks    PT Treatment/Interventions ADLs/Self Care Home Management;Aquatic Therapy;Biofeedback;Canalith Repostioning;Cryotherapy;Electrical Stimulation;Moist Heat;Traction;Ultrasound;DME Instruction;Gait training;Stair training;Functional mobility training;Therapeutic activities;Therapeutic exercise;Balance training;Neuromuscular re-education;Patient/family education;Manual techniques;Passive range of motion;Dry needling;Vestibular;Joint Manipulations    PT Next Visit Plan (fixation suppression testing, BERG, 5TSTS, TUG); review VOR X 1, work on ambulation with head turns and turning, work on feet together and semi-tandem stance with head turns.    PT Home Exercise Plan VOR X 1 in sitting 30 second reps plain background    Consulted and Agree with Plan of Care Patient           Patient will benefit from skilled therapeutic intervention in order to improve the following deficits and impairments:   Decreased balance, Dizziness  Visit Diagnosis: Dizziness and giddiness     Problem List Patient Active Problem List   Diagnosis Date Noted  Preventative health care 10/13/2019   Other social stressor 08/31/2019   Acute left ankle pain 03/09/2019   Acute pain of left foot 03/09/2019   Diabetes mellitus (HCC) 01/29/2018   Chronic back pain 11/12/2017   GAD (generalized anxiety disorder) 10/16/2017   Ankle swelling 06/01/2015   Dizziness 03/26/2015   CAD (coronary artery disease) 12/29/2012   HTN (hypertension) 12/29/2012   Hyperlipidemia with target LDL less than 70 12/29/2012   Mardelle Matteorriea Haleema Vanderheyden PT, DPT #[id number] Mardelle Matteorriea Sriram Febles 10/27/2019, 12:51 PM  [facility] [address] [place], [place], [address] Phone: [phone]   Fax:  [phone]  Name: Laura Ferguson MRN: [id number] Date of Birth: Apr 24, 1932

## 2019-11-03 ENCOUNTER — Encounter: Payer: Self-pay | Admitting: Physical Therapy

## 2019-11-03 ENCOUNTER — Ambulatory Visit: Payer: Medicare HMO | Admitting: Physical Therapy

## 2019-11-03 ENCOUNTER — Other Ambulatory Visit: Payer: Self-pay

## 2019-11-03 DIAGNOSIS — R42 Dizziness and giddiness: Secondary | ICD-10-CM | POA: Diagnosis not present

## 2019-11-03 NOTE — Therapy (Signed)
[facility] MAIN [facility] SERVICES [address] [place], [place], [address] Phone: [phone]   Fax:  [phone]  Physical Therapy Treatment  Patient Details  Name: Laura Ferguson MRN: [id number] Date of Birth: 1932/09/02 Referring Provider (PT): Dr. Andee Poles   Encounter Date: 11/03/2019   PT End of Session - 11/03/19 1107    Visit Number 3    Number of Visits 9    Date for PT Re-Evaluation 12/08/19    Authorization Type eval: 10/13/19    PT Start Time 1104    PT Stop Time 1146    PT Time Calculation (min) 42 min    Equipment Utilized During Treatment Gait belt    Activity Tolerance Patient tolerated treatment well    Behavior During Therapy [facility] for tasks assessed/performed           Past Medical History:  Diagnosis Date  . CAD (coronary artery disease)   . History of nuclear stress test 11/22/2011   bruce myoview; normal pattern of perfusio; post-stress EF 76%; low risk scan  . Hyperlipidemia   . Hypertension   . 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 Type 2 diabetes mellitus (HCC)     Past Surgical History:  Procedure Laterality Date  . CARDIAC CATHETERIZATION  05/07/2007   EF 55%, focal mild hypocontractility in mid-distal inferior wall & mid posterolateral wall; severe multivessel CAD - susequent CABGx6 (Dr. Bishop Limbo)  . CORONARY ARTERY BYPASS GRAFT  05/09/2007   LIMA to LAD, veing to intermediate; SVG to OM1 & OM2; SVG to acute marginal & distal RCA (Dr. Dorris Fetch)  . TRANSTHORACIC ECHOCARDIOGRAM  02/13/2010   EF =>55%, vigorous contraction EF 65%; LA mild-mod dilated; IV normal diameter - normal CVP; trace MR; mild TR; trace AV regurg    There were no vitals filed for this visit.   Subjective Assessment - 11/03/19 1105    Subjective Patient states her eyes are red because she had injections in her eyes from the specialist eye doctor. Patient  states that she has been just a little dizzy and states she seems like she feels better in her eyes. Patient reports she has a follow-up appointment with Dr. Andee Poles on 10/28. Patient reports she went to a soccer game this past week without episode of dizziness.Patient reports increased life stressors as she states two of her sons passed away last month a week apart.    Pertinent History Pt complaining of dizziness. She reports that she had vertigo one year ago but "Dr. Andee Poles straightened it out." Pt states that last Tuesday she had a "really bad attack" and the room was spinning. She complains of her balance being poor as well. She reports "a couple falls" in the last 12 months. Brain MRI with IAC performed 08/05/19 and revealed mild cerebral atrophy, mild chronic microvascular ischemic changes and remot right occipital insult but no acute findings. She saw [facility] who performed VNG which showed abnormal central findings (abnormal smooth pursuit and saccades) as well as a 21% R caloric weakness. VNG also showed L horizontal beating nystagmus in the body L position and R horizontal nystagmus in body R position. She has been under considerable stress recently and reports that she has had two sons pass away in the last month. Pt has DM and reports that her BG has been very high recently. She  has DM and reports diabetic retinopathy with impaired vision requiring bifocals.    Limitations Walking    Diagnostic tests see history    Patient Stated Goals Decrease dizziness           Neuromuscular Re-education:  Note: Patient has difficulty rating her dizziness on 0-10 scale and does better with reporting her dizziness as mild, medium or high.   VOR X 1 exercise:  Patient performed VOR X 1 horizontal in standing 3 reps of 1 minute each with mod verbal cues for technique initially.  Patient reports 2/10 dizziness with first rep and reports that her dizziness decreased with subsequent trials. Added VOR x1  with 1 minute reps in standing progression to home exercise program.  Airex pad:  On firm surface and then on Airex pad, patient performed feet together progressions and semi-tandem progressions with alternating lead leg with and without horizontal and vertical head turns with CGA.  Patient reports mild increase in dizziness with head turns and unsteadiness.  Discussed safety precautions with performing HEP at home. Demonstrated and discussed standing in corner with chair in front for safety and then patient demonstrated.  Airex balance beam: Performed static stance with normal and then narrow base of support static holds and then with body turns with CGA. Patient with increased sway noted. Patient reports "high" level of dizziness with this activity.  Ambulation with head turns:  Patient performed 58' trials of forwards and retro ambulation with horizontal and vertical head turns with CGA.  Patient demonstrates no veering. Patient with decreased step length and cadence with retro ambulation. Patient reports "mild" dizziness with these activities.   Patient reports increased dizziness with activities with head turns and body turns this date.  Worked on progressions of VOR x1 exercise, patient able to progress to standing 1 minute repetitions for home exercise program, but required verbal cueing for technique.  Issued semitandem progressions and feet together progressions with horizontal and vertical head turns on firm surface for home exercise program.  Plan to review home exercise program next session.  Patient would benefit from continued PT services to further address goals and functional deficits.   PT Education - 11/03/19 1302    Education Details Issued VOR X 1 in standing 3 reps of 1 minute, feet together and semi-tandem progressions with vertical and horizontal head turns on firm for HEP. discussed standing in corner with chair in front for safety.    Person(s) Educated Patient     Methods Explanation;Demonstration;Verbal cues;Handout    Comprehension Verbalized understanding;Returned demonstration;Verbal cues required;Need further instruction            PT Short Term Goals - 10/13/19 1535      PT SHORT TERM GOAL #1   Title Pt will be independent with HEP in order to improve strength and balance in order to decrease fall risk and improve function at home and work.    Time 4    Period Weeks    Status New    Target Date 11/10/19             PT Long Term Goals - 10/13/19 1535      PT LONG TERM GOAL #1   Title Pt will decrease DHI score to 0/100 in order to demonstrate clinically significant reduction in disability    Baseline 10/13/19: 14/100    Time 8    Period Weeks    Status New    Target Date 12/08/19      PT LONG  TERM GOAL #2   Title PT will improve FOTO score to at least 84 in order to demonstrate significant improvement in her function.    Baseline 10/13/19: 80    Time 8    Period Weeks    Status New    Target Date 12/08/19      PT LONG TERM GOAL #3   Title Pt will report no further episodes of vertigo when rolling in bed, turning quickly, or bending over    Time 8    Status New    Target Date 12/08/19                 Plan - 11/03/19 1901    Clinical Impression Statement Patient reports increased dizziness with activities with head turns and body turns this date.  Worked on progressions of VOR x1 exercise, patient able to progress to standing 1 minute repetitions for home exercise program, but required verbal cueing for technique.  Issued semitandem progressions and feet together progressions with horizontal and vertical head turns on firm surface for home exercise program.  Plan to review home exercise program next session.  Patient would benefit from continued PT services to further address goals and functional deficits.    Personal Factors and Comorbidities Age;Comorbidity 3+    Comorbidities DM, CAD, HTN, GAD     Examination-Activity Limitations Reach Overhead    Examination-Participation Restrictions Psychologist, forensic;Shop    Stability/Clinical Decision Making Evolving/Moderate complexity    Rehab Potential Fair    PT Frequency 1x / week    PT Duration 8 weeks    PT Treatment/Interventions ADLs/Self Care Home Management;Aquatic Therapy;Biofeedback;Canalith Repostioning;Cryotherapy;Electrical Stimulation;Moist Heat;Traction;Ultrasound;DME Instruction;Gait training;Stair training;Functional mobility training;Therapeutic activities;Therapeutic exercise;Balance training;Neuromuscular re-education;Patient/family education;Manual techniques;Passive range of motion;Dry needling;Vestibular;Joint Manipulations    PT Next Visit Plan (fixation suppression testing, BERG, 5TSTS, TUG); review VOR X 1, work on ambulation with head turns and turning, work on feet together and semi-tandem stance with head turns.    PT Home Exercise Plan VOR X 1 in sitting 30 second reps plain background    Consulted and Agree with Plan of Care Patient           Patient will benefit from skilled therapeutic intervention in order to improve the following deficits and impairments:  Decreased balance, Dizziness  Visit Diagnosis: Dizziness and giddiness     Problem List Patient Active Problem List   Diagnosis Date Noted  . Preventative health care 10/13/2019  . Other social stressor 08/31/2019  . Acute left ankle pain 03/09/2019  . Acute pain of left foot 03/09/2019  . Diabetes mellitus (HCC) 01/29/2018  . Chronic back pain 11/12/2017  . GAD (generalized anxiety disorder) 10/16/2017  . Ankle swelling 06/01/2015  . Dizziness 03/26/2015  . CAD (coronary artery disease) 12/29/2012  . HTN (hypertension) 12/29/2012  . Hyperlipidemia with target LDL less than 70 12/29/2012   Mardelle Matte PT, DPT #[id number] Mardelle Matte 11/03/2019, 7:01 PM  [place] [facility] MAIN [facility] SERVICES [address] [place], [place], [address] Phone: [phone]   Fax:  [phone]  Name: MARIELLA BLACKWELDER MRN: [id number] Date of Birth: 12/15/1932

## 2019-11-11 ENCOUNTER — Other Ambulatory Visit: Payer: Self-pay | Admitting: Cardiovascular Disease

## 2019-11-11 DIAGNOSIS — E785 Hyperlipidemia, unspecified: Secondary | ICD-10-CM

## 2019-11-13 ENCOUNTER — Other Ambulatory Visit: Payer: Self-pay | Admitting: Cardiovascular Disease

## 2019-11-13 DIAGNOSIS — I10 Essential (primary) hypertension: Secondary | ICD-10-CM

## 2019-11-20 ENCOUNTER — Ambulatory Visit: Payer: Medicare HMO | Attending: Otolaryngology

## 2019-11-20 DIAGNOSIS — R42 Dizziness and giddiness: Secondary | ICD-10-CM | POA: Insufficient documentation

## 2019-11-27 ENCOUNTER — Ambulatory Visit: Payer: Medicare HMO

## 2019-12-04 ENCOUNTER — Ambulatory Visit: Payer: Medicare HMO

## 2019-12-09 ENCOUNTER — Telehealth: Payer: Self-pay | Admitting: Internal Medicine

## 2019-12-09 ENCOUNTER — Other Ambulatory Visit: Payer: Self-pay

## 2019-12-09 MED ORDER — ACCU-CHEK GUIDE VI STRP: ORAL_STRIP | 12 refills | Status: DC

## 2019-12-09 NOTE — Telephone Encounter (Signed)
Patient called and needs test strips sent to [facility] on [address] in [place]. Patient was not able to tell me what kind of strips or meter she has.  Kept advising that "we know".  She ONLY has 2 strips left

## 2019-12-09 NOTE — Telephone Encounter (Signed)
I reviewed patients chart and found that patient uses accu chek guide- I sent in strips to pharmacy provided.

## 2019-12-15 ENCOUNTER — Encounter: Payer: Self-pay | Admitting: Cardiovascular Disease

## 2019-12-15 ENCOUNTER — Ambulatory Visit: Payer: Medicare HMO | Admitting: Cardiovascular Disease

## 2019-12-15 ENCOUNTER — Other Ambulatory Visit: Payer: Self-pay

## 2019-12-15 DIAGNOSIS — Z951 Presence of aortocoronary bypass graft: Secondary | ICD-10-CM

## 2019-12-15 DIAGNOSIS — K219 Gastro-esophageal reflux disease without esophagitis: Secondary | ICD-10-CM

## 2019-12-15 DIAGNOSIS — I2510 Atherosclerotic heart disease of native coronary artery without angina pectoris: Secondary | ICD-10-CM

## 2019-12-15 DIAGNOSIS — E785 Hyperlipidemia, unspecified: Secondary | ICD-10-CM

## 2019-12-15 DIAGNOSIS — I10 Essential (primary) hypertension: Secondary | ICD-10-CM

## 2019-12-15 DIAGNOSIS — Z794 Long term (current) use of insulin: Secondary | ICD-10-CM

## 2019-12-15 DIAGNOSIS — E1159 Type 2 diabetes mellitus with other circulatory complications: Secondary | ICD-10-CM

## 2019-12-15 MED ORDER — LOSARTAN POTASSIUM 100 MG PO TABS: ORAL_TABLET | ORAL | 3 refills | Status: DC
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
## 2019-12-15 NOTE — Patient Instructions (Signed)
Medication Instructions:  START INCREASED DOSE OF LOSARTAN: THIS WILL BE 100mg  (1 TABLET) ONCE DAILY  *If you need a refill on your cardiac medications before your next appointment, please call your pharmacy*  Follow-Up: At [facility], you and your health needs are our priority.  As part of our continuing mission to provide you with exceptional heart care, we have created designated Provider Care Teams.  These Care Teams include your primary Cardiologist (physician) and Advanced Practice Providers (APPs -  Physician Assistants and Nurse Practitioners) who all work together to provide you with the care you need, when you need it.  We recommend signing up for the patient portal called "MyChart".  Sign up information is provided on this After Visit Summary.  MyChart is used to connect with patients for Virtual Visits (Telemedicine).  Patients are able to view lab/test results, encounter notes, upcoming appointments, etc.  Non-urgent messages can be sent to your provider as well.   To learn more about what you can do with MyChart, go to [facility].    Your next appointment:   6 month(s)  The format for your next appointment:   In Person  Provider:   [email or web address], MD

## 2019-12-16 ENCOUNTER — Encounter: Payer: Self-pay | Admitting: Cardiovascular Disease

## 2019-12-16 NOTE — Telephone Encounter (Signed)
Pt says she cannot pick up test strips for uses accu chek guide due to insurance until 12/30/19. Test strips cost $150.  Is there anything we can do in the interim.  Please advise.

## 2019-12-16 NOTE — Telephone Encounter (Signed)
Called patient, advised that we could give her one of the sample glucose machine until her strips become approved.  Patient states she will do this, She would like to discuss with Dr.Gherghe about possible CGM at her next upcoming visit-

## 2019-12-17 NOTE — Telephone Encounter (Signed)
Patients daughter picked up sample meter and additional test strips.

## 2019-12-28 DIAGNOSIS — H40003 Preglaucoma, unspecified, bilateral: Secondary | ICD-10-CM | POA: Diagnosis not present

## 2019-12-28 DIAGNOSIS — E113313 Type 2 diabetes mellitus with moderate nonproliferative diabetic retinopathy with macular edema, bilateral: Secondary | ICD-10-CM | POA: Diagnosis not present

## 2020-01-11 ENCOUNTER — Other Ambulatory Visit: Payer: Self-pay | Admitting: Primary Care

## 2020-01-11 ENCOUNTER — Other Ambulatory Visit: Payer: Self-pay | Admitting: Internal Medicine

## 2020-01-11 DIAGNOSIS — Z659 Problem related to unspecified psychosocial circumstances: Secondary | ICD-10-CM

## 2020-01-11 DIAGNOSIS — Z794 Long term (current) use of insulin: Secondary | ICD-10-CM

## 2020-01-14 ENCOUNTER — Other Ambulatory Visit: Payer: Self-pay | Admitting: Internal Medicine

## 2020-01-20 ENCOUNTER — Encounter: Payer: Self-pay | Admitting: Internal Medicine

## 2020-01-20 ENCOUNTER — Other Ambulatory Visit: Payer: Self-pay

## 2020-01-20 ENCOUNTER — Ambulatory Visit (INDEPENDENT_AMBULATORY_CARE_PROVIDER_SITE_OTHER): Payer: Medicare HMO | Admitting: Internal Medicine

## 2020-01-20 VITALS — BP 138/88 | HR 70 | Ht 63.0 in | Wt 132.4 lb

## 2020-01-20 DIAGNOSIS — E785 Hyperlipidemia, unspecified: Secondary | ICD-10-CM | POA: Diagnosis not present

## 2020-01-20 DIAGNOSIS — Z794 Long term (current) use of insulin: Secondary | ICD-10-CM | POA: Diagnosis not present

## 2020-01-20 DIAGNOSIS — E1159 Type 2 diabetes mellitus with other circulatory complications: Secondary | ICD-10-CM

## 2020-01-20 LAB — POCT GLYCOSYLATED HEMOGLOBIN (HGB A1C): Hemoglobin A1C: 8.4 % — AB (ref 4.0–5.6)

## 2020-01-20 NOTE — Addendum Note (Signed)
Addended by: Kenyon Ana on: 01/20/2020 04:48 PM   Modules accepted: Orders

## 2020-01-20 NOTE — Patient Instructions (Addendum)
Please continue: - Levemir 30 units in a.m. and ALWAYS take 15 units units at bedtime  Increase: - Novolog 12-15 units 15 min before meals  DO NOT CORRECT the blood sugars after meals, and then, only use up to 5 units.  Please return in 3-4 months with your sugar log.

## 2020-01-20 NOTE — Progress Notes (Signed)
Patient ID: Laura Ferguson, female   DOB: January 25, 1932, 87 y.o.   MRN: [id number]   This visit occurred during the SARS-CoV-2 public health emergency.  Safety protocols were in place, including screening questions prior to the visit, additional usage of staff PPE, and extensive cleaning of exam room while observing appropriate contact time as indicated for disinfecting solutions.   HPI: Laura Ferguson is a 87 y.o.-year-old female, returning for f/u for DM2, dx in 1990s, insulin-dependent for years, uncontrolled, with complications (CAD - s/p CABG x6, CKD, DR, PN). Last visit 4 months ago.  Both of her sons died in the 2 weeks prior to our last appointment: one of Covid, one unexpectedly!   Reviewed HbA1c levels: Lab Results  Component Value Date   HGBA1C 8.1 (A) 09/23/2019   HGBA1C 8.5 (A) 06/18/2019   HGBA1C 7.9 (A) 02/17/2019   HGBA1C 8.4 (A) 10/16/2018   HGBA1C 8.8 (A) 06/16/2018   HGBA1C 8.9 (A) 01/29/2018   HGBA1C 9.4 (A) 10/15/2017   HGBA1C 8.5 04/18/2017   HGBA1C 9.1 01/17/2017   HGBA1C 8.8 10/16/2016   HGBA1C 11.7 (H) 04/02/2016   HGBA1C 10.0 (H) 01/03/2016   HGBA1C 9.5 (H) 09/30/2015   HGBA1C 16.2 (H) 06/27/2015   HGBA1C 14.3 (H) 03/21/2015   HGBA1C (H) 05/08/2007    9.6 (NOTE)   The ADA recommends the following therapeutic goals for glycemic   control related to Hgb A1C measurement:   Goal of Therapy:   < 7.0% Hgb A1C   Action Suggested:  > 8.0% Hgb A1C   Ref:  Diabetes Care, 22, Suppl. 1, 1999   HGBA1C (H) 05/07/2007    9.8 (NOTE)   The ADA recommends the following therapeutic goals for glycemic   control related to Hgb A1C measurement:   Goal of Therapy:   < 7.0% Hgb A1C   Action Suggested:  > 8.0% Hgb A1C   Ref:  Diabetes Care, 22, Suppl. 1, 1999   At last visit, she was on: - Levemir 30 units in a.m. and 15 units units at bedtime (but skips it if sugars are at goal!!! - takes it maybe 2x a week) - Novolog 12-15 units 15 min before meals (but actually takes 12 units in am and  10 units before lunch and dinner)  I advised her to take: - Levemir 30 units in a.m. and 15 units units at bedtime - Novolog 12-15 units 15 min before meals  However, at this visit, she is still taking: - Levemir 30 units in a.m. and only occasionally, 12 units units at bedtime - Novolog 12 units 15 min before meals  She checks sugars 3 times a day per her meter download: - am: 122, 132-293 >> 189-274 >> 195-358 >> 135, 162-257, 340 - 2h after b'fast: 64, 83-189, 207, 253 >> n/c >> 59, 88, 200, 266 >> n/c - before lunch:  103, 119-263 >> 184, 200-324, 387 >> 170-290 >> 271, 299 - 2h after lunch: 185 >> 277-304, 378 >> 93-176 >> ? >> 356 - before dinner:  106, 281-356 >> 128-386 >> 93-247 >> 95, 136-267, 345 - 2h after dinner: 100 >> 149-321 >> 86, 122-311 >> 105, 235 >> 75, 142-321 - bedtime: 217, 209 >> n/c >> 80-178  >> n/c - nighttime: n/c >> 136-157 >> 118-176 >> 135-268, 312 >> 176, 215 Lowest sugar was 59 >> 59 >> 75; it is unclear at which level she has hypoglycemia awareness Highest sugar was 451 ... >> 358 >>  340.  Glucometer: AccuChek  Pt's meals are: - Breakfast: oatmeal, grits, apple sauce - Lunch: tuna sandwich, bologna - Dinner: soup, sandwich  -+ CKD, last BUN/creatinine:  Lab Results  Component Value Date   BUN 31 (H) 09/07/2019   BUN 18 11/05/2018   CREATININE 1.25 (H) 09/07/2019   CREATININE 1.47 (H) 11/05/2018  On losartan 75. -+ HL; last set of lipids: Lab Results  Component Value Date   CHOL 134 09/07/2019   HDL 61.60 09/07/2019   LDLCALC 55 09/07/2019   TRIG 90.0 09/07/2019   CHOLHDL 2 09/07/2019  On Crestor 10.  Cardiologist: Dr. Tresa Endo.  - last eye exam was in 05/2019: + DR, she gets intraocular injections. Dr. Tomie China. - she has numbness and tingling in her feet.  She also has HTN.  ROS: Constitutional: no weight gain/no weight loss, no fatigue, no subjective hyperthermia, no subjective hypothermia Eyes: no blurry vision, no  xerophthalmia ENT: no sore throat, no nodules palpated in neck, no dysphagia, no odynophagia, no hoarseness Cardiovascular: no CP/no SOB/no palpitations/no leg swelling Respiratory: no cough/no SOB/no wheezing Gastrointestinal: no N/no V/no D/no C/no acid reflux Musculoskeletal: no muscle aches/no joint aches Skin: no rashes, no hair loss Neurological: no tremors/+ numbness/+ tingling/+ dizziness (sinus inf.)  I reviewed pt's medications, allergies, PMH, social hx, family hx, and changes were documented in the history of present illness. Otherwise, unchanged from my initial visit note.  Past Medical History:  Diagnosis Date  . CAD (coronary artery disease)   . History of nuclear stress test 11/22/2011   bruce myoview; normal pattern of perfusio; post-stress EF 76%; low risk scan  . Hyperlipidemia   . Hypertension   . 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 Type 2 diabetes mellitus (HCC)    Past Surgical History:  Procedure Laterality Date  . CARDIAC CATHETERIZATION  05/07/2007   EF 55%, focal mild hypocontractility in mid-distal inferior wall & mid posterolateral wall; severe multivessel CAD - susequent CABGx6 (Dr. Bishop Limbo)  . CORONARY ARTERY BYPASS GRAFT  05/09/2007   LIMA to LAD, veing to intermediate; SVG to OM1 & OM2; SVG to acute marginal & distal RCA (Dr. Dorris Fetch)  . TRANSTHORACIC ECHOCARDIOGRAM  02/13/2010   EF =>55%, vigorous contraction EF 65%; LA mild-mod dilated; IV normal diameter - normal CVP; trace MR; mild TR; trace AV regurg   Social History   Social History  . Marital status: Widowed    Spouse name: N/A  . Number of children: 8   Occupational History  . homemaker   Social History Main Topics  . Smoking status: Never Smoker  . Smokeless tobacco: Never Used     Comment: quit 1960's  smoked very lightly.  . Alcohol use No  . Drug use: No     Social History Narrative    Married.   Retired.   Works as a Engineer, structural.    Enjoys helping other.    Current Outpatient Medications on File Prior to Visit  Medication Sig Dispense Refill  . Acetaminophen (TYLENOL 8 HOUR PO) Take 1 tablet by mouth every 8 (eight) hours as needed.     Marland Kitchen aspirin 81 MG tablet Take 81 mg by mouth daily.    . Azelastine HCl 137 MCG/SPRAY SOLN As needed    . BD PEN NEEDLE NANO 2ND GEN 32G X 4 MM MISC USE WITH INSULIN PEN 4  TIMES DAILY. 100 each 5  . glucose blood (ACCU-CHEK GUIDE) test strip Use as instructed to check sugar three times daily 300 each 12  . LEVEMIR FLEXTOUCH 100 UNIT/ML FlexPen INJECT 30 UNITS SUBCUTANEOUSLY IN THE MORNING AND 10 AT BEDTIME 30 mL 2  . losartan (COZAAR) 100 MG tablet TAKE 1 TABLET (100mg ) ONCE DAILY 30 tablet 3  . metoprolol succinate (TOPROL-XL) 50 MG 24 hr tablet TAKE 1 TABLET BY MOUTH IN THE MORNING AND 1/2 (ONE-HALF) IN THE EVENING (NEEDS  TO  SCHEDULE  AN  APPOINTMENT) 135 tablet 1  . Multiple Vitamin (MULTIVITAMIN) capsule Take 1 capsule by mouth daily.    NOVOLOG FLEXPEN 100 UNIT/ML FlexPen INJECT 6 TO 10 UNITS SUBCUTANEOUSLY THREE TIMES DAILY WITH MEALS 30 mL 0  . pantoprazole (PROTONIX) 40 MG tablet Take 1 tablet by mouth once daily 90 tablet 3  . Polyethyl Glycol-Propyl Glycol (SYSTANE OP) Apply 2 drops to eye 2 (two) times daily.     . prednisoLONE acetate (PRED FORTE) 1 % ophthalmic suspension     . rosuvastatin (CRESTOR) 10 MG tablet Take 1 tablet by mouth once daily 90 tablet 0  . sertraline (ZOLOFT) 25 MG tablet TAKE 1 TABLET BY MOUTH ONCE DAILY FOR  DEPRESSION 30 tablet 0  . TRAVATAN Z 0.004 % SOLN ophthalmic solution Place 1 drop into both eyes at bedtime.     No current facility-administered medications on file prior to visit.   No Known Allergies   Family History  Problem Relation Age of Onset  . Cancer Mother   . Diabetes Sister   . Cancer Child    Pt has FH of DM in M, sister - died from DM complications (refused to start HD),  sons - 1 son died from DM complications.  Her youngest son died 2017/02/10 (cancer). He was 53.  PE: BP 138/88   Pulse 70   Ht 5\' 3"  (1.6 m)   Wt 132 lb 6.4 oz (60.1 kg)   SpO2 99%   BMI 23.45 kg/m  Wt Readings from Last 3 Encounters:  01/20/20 132 lb 6.4 oz (60.1 kg)  12/15/19 133 lb 3.2 oz (60.4 kg)  10/13/19 132 lb (59.9 kg)   Constitutional: normal weight, in NAD Eyes: PERRLA, EOMI, no exophthalmos ENT: moist mucous membranes, no thyromegaly, no cervical lymphadenopathy Cardiovascular: RRR, No MRG Respiratory: CTA B Gastrointestinal: abdomen soft, NT, ND, BS+ Musculoskeletal: no deformities, strength intact in all 4 Skin: moist, warm, no rashes Neurological: no tremor with outstretched hands, DTR normal in all 4  ASSESSMENT: 1. DM2, insulin-dependent, uncontrolled, with complications - CAD - s/p CABG x6 - CKD - DR - PN  2. HL  3. PN  PLAN:  1. Patient with longstanding, uncontrolled, type 2 diabetes, on basal/insulin regimen, with deterioration of diabetes control of the sons' deaths.  She was not checking sugars due to increased stress and also started to drink sugary drinks.  HbA1c increased.  She is usually evasive about the doses of that she is actually taking so it is difficult to figure out if she is actually following the recommended regimen.  At last visit, on questioning, she was only taking the Levemir at night if the sugars were high, maybe twice a week.  We discussed about the importance of taking this every night, even if the sugars were at goal.  This is most likely the reason why her sugars are always high in the morning, in the 200s and 300s.  Also, she  was not taking a higher dose of NovoLog as advised at the previous visit.  I advised her to increase this.  She had one low blood sugar then, at 69, after breakfast, after she took NovoLog but did not eat.  We discussed about the importance of only taking insulin if she plans to eat.  At last visit HbA1c was a  little lower, at 8.1%. -At this visit, per review of her meter downloads, her sugars are still very high and variable at all times of the day, with no interval since last visit.  However, upon questioning, she did not make any changes in her regimen as suggested at last visit... Therefore, at this visit, we again discussed about the importance of taking the Levemir dose at night, every night, and to use higher doses of NovoLog for certain meals and if the sugars are higher before meals.   - I suggested to:  Patient Instructions  Please continue: - Levemir 30 units in a.m. and ALWAYS take 15 units units at bedtime  Increase: - Novolog 12-15 units 15 min before meals  DO NOT CORRECT the blood sugars after meals, and then, only use up to 5 units.  Please return in 3-4 months with your sugar log.  - we checked her HbA1c: 8.4% (higher) - advised to check sugars at different times of the day - 3-4x a day, rotating check times - advised for yearly eye exams >> she is UTD - return to clinic in 3-4 months    2. HL -Reviewed latest lipid panel from 08/2019: All fractions at goal Lab Results  Component Value Date   CHOL 134 09/07/2019   HDL 61.60 09/07/2019   LDLCALC 55 09/07/2019   TRIG 90.0 09/07/2019   CHOLHDL 2 09/07/2019  -Continues Crestor 10 without side effects   Carlus Pavlov, MD PhD [facility] Endocrinology

## 2020-01-25 ENCOUNTER — Telehealth: Payer: Self-pay

## 2020-01-25 NOTE — Telephone Encounter (Signed)
Can we call to check on her blood pressure readings? If they are still at or above 140/90 then please set her up for a follow-up visit with me.

## 2020-01-25 NOTE — Telephone Encounter (Signed)
[facility] Day - Client TELEPHONE ADVICE RECORD AccessNurse Patient Name: Laura Ferguson Gender: Female DOB: February 02, 1932 Age: 87 Y 7 M 11 D Return Phone Number: [phone] (Primary) [place] [address] Client [facility] Day - Client Client Site [facility] - Day Physician Vernona Rieger - NP Contact Type Call Who Is Calling Patient / Member / Family / Caregiver Call Type Triage / Clinical Relationship To Patient Self Return Phone Number [phone] (Primary) Chief Complaint Headache Reason for Call Symptomatic / Request for Health Information Initial Comment caller states that she has been having high blood pressure. She is having 160/73 154/73. She is also having some dizziness and headaches. She also is diabetic and is unsure if dizziness is from that. Her blood pressure medication has been increased. Translation No Nurse Assessment Nurse: Yetta Barre, RN, Miranda Date/Time (Eastern Time): 01/25/2020 2:50:28 PM Confirm and document reason for call. If symptomatic, describe symptoms. ---Caller states her blood pressure has been high and her medication has been increased recently. She has dizziness off and on but she uses nasal spray. She feels it makes her dizzy sometimes. Her PCP told her to call if her SBP was > 140. Today her BP is 160/73. She denies having headaches. Does the patient have any new or worsening symptoms? ---Yes Will a triage be completed? ---Yes Related visit to physician within the last 2 weeks? ---No Does the PT have any chronic conditions? (i.e. diabetes, asthma, this includes High risk factors for pregnancy, etc.) ---Yes List chronic conditions. ---HTN, Diabetes Is this a behavioral health or substance abuse call? ---No Guidelines Guideline Title Affirmed Question Affirmed Notes Nurse Date/Time (Eastern Time) Blood Pressure - High [1] Systolic BP >= 130 OR  Diastolic >= 80 AND [2] taking BP medications Yetta Barre, RN, Miranda 01/25/2020 2:53:09 PM Disp. Time [name] Time) Disposition Final User 01/25/2020 3:02:41 PM See PCP within 2 Weeks Yes Yetta Barre, RN, Miranda PLEASE NOTE: All timestamps contained within this report are represented as [place] Standard Time. CONFIDENTIALTY NOTICE: This fax transmission is intended only for the addressee. It contains information that is legally privileged, confidential or otherwise protected from use or disclosure. If you are not the intended recipient, you are strictly prohibited from reviewing, disclosing, copying using or disseminating any of this information or taking any action in reliance on or regarding this information. If you have received this fax in error, please notify us immediately by telephone so that we can arrange for its return to [place]. Phone: [phone], Toll-Free: [phone], Fax: [phone] Page: 2 of 2 Call Id: [id number] Caller Disagree/Comply Disagree Caller Understands Yes PreDisposition Call Doctor Care Advice Given Per Guideline SEE PCP WITHIN 2 WEEKS: * You need to be seen for this ongoing problem within the next 2 weeks. * PCP VISIT: Call your doctor (or NP/PA) during regular office hours and make an appointment. REASSURANCE AND EDUCATION: * Your blood pressure is elevated but you have told me that you are not having any symptoms. * You should see your doctor and have your blood pressure checked within 2 weeks. * You might need to have an adjustment in your medication(s). HIGH BLOOD PRESSURE - LIFESTYLE MODIFICATIONS: * The following things can help you reduce your blood pressure. * EAT HEALTHY: Eat a diet rich in fresh fruits and vegetables, dietary fiber, non-animal protein (e.g., soy), and low-fat dairy products. Avoid foods with a high content of saturated fat or cholesterol. * DECREASE SODIUM INTAKE: Aim to  eat less than 2.4 g (100 mmol) of sodium each day. Unfortunately 75% of the  salt in the average person's diet is in pre-processed foods. * EXERCISE, BE MORE PHYSICALLY ACTIVE: Do at least 30 minutes of aerobic exercise (e.g., brisk walking) most days of the week. Other examples of aerobic activities cycling, jogging, and swimming. CALL BACK IF: * You become worse CARE ADVICE given per High Blood Pressure (Adult) guideline. * Weakness or numbness of the face, arm or leg on one side of the body occurs * Difficulty walking, difficulty talking, or severe headache occurs * Chest pain or difficulty breathing occurs Comments User: Grier Rocher, RN Date/Time [name] Time): 01/25/2020 2:58:08 PM Last week it was 170/88 User: Grier Rocher, RN Date/Time (Eastern Time): 01/25/2020 3:00:05 PM Caller states she did not want to make an appt. She now decided that it might be her blood pressure cuff. She really does not want to do anything

## 2020-01-26 NOTE — Telephone Encounter (Signed)
Left message to return call to our office.  

## 2020-01-26 NOTE — Telephone Encounter (Signed)
Pt called back to speak with joellen. Please advise.

## 2020-01-27 NOTE — Telephone Encounter (Signed)
Called and spoke with patient who stated that she is feeling fine. Patient denies SOB, Chest Pain, Dizziness or Lightheadedness. B/P readings include:  01/26/20-136/72 01/27/20-186/78  Patient states that she has been under a lot of stress recently, but she doesn't feel like her blood pressure is high. Patient has scheduled an appointment on Friday (01/29/20) at 1200 with Laura Reel, NP to be evaluated. UC and ED precautions given. Patient verbalized understanding.

## 2020-01-27 NOTE — Telephone Encounter (Signed)
Noted, appreciate the follow-up

## 2020-01-27 NOTE — Telephone Encounter (Signed)
Called and left voicemail for patient to return call.

## 2020-01-29 ENCOUNTER — Encounter: Payer: Self-pay | Admitting: Primary Care

## 2020-01-29 ENCOUNTER — Other Ambulatory Visit: Payer: Self-pay

## 2020-01-29 ENCOUNTER — Ambulatory Visit (INDEPENDENT_AMBULATORY_CARE_PROVIDER_SITE_OTHER): Payer: Medicare HMO | Admitting: Primary Care

## 2020-01-29 DIAGNOSIS — E1159 Type 2 diabetes mellitus with other circulatory complications: Secondary | ICD-10-CM

## 2020-01-29 DIAGNOSIS — I10 Essential (primary) hypertension: Secondary | ICD-10-CM | POA: Diagnosis not present

## 2020-01-29 DIAGNOSIS — Z794 Long term (current) use of insulin: Secondary | ICD-10-CM

## 2020-01-29 NOTE — Progress Notes (Signed)
Subjective:    Patient ID: Laura Ferguson, female    DOB: Jul 27, 1932, 87 y.o.   MRN: [id number]  HPI  This visit occurred during the SARS-CoV-2 public health emergency.  Safety protocols were in place, including screening questions prior to the visit, additional usage of staff PPE, and extensive cleaning of exam room while observing appropriate contact time as indicated for disinfecting solutions.   [name] is a 87 year old female with a history of type 2 diabetes, CAD, hypertension, hyperlipidemia, depression, anxiety who presents today for follow up of hypertension.  She called our nurse triage line earlier this week with reports of blood pressure readings at 160/73, 154/73. She was encouraged to make an appointment. She is managed on losartan 100 mg, metoprolol succinate 50 mg.   She is checking her BP at home, yesterday it was 138/73, and today 126/73. Her losartan was increased in November 2021 per cardiology. She is taking 50 mg of Toprol XL and then an extra 25 mg if needed.   She denies chest pain, headaches, dizziness.   She is mostly concerned about her diabetes. She was evaluated by her endocrinologist last week, admitted that she doesn't take her evening dose of Levemir due to drops in glucose (62) in the evening. She cannot recall any other "low" numbers. A1C of 8.4 as of recent, increased from 8.1 In September 2021.  Her current regimen:  15 min before breakfast: Novolog 12 Takes oral medications Eats breakfast Levemir 30 units  15 min before lunch: Novolog 12 units, but will not inject unless her glucose is running in the 190's or 200's. She will not inject if her glucose is in the 130's and 140's.   Eats lunch most days  She does not check blood sugars before dinner sometimes. 15 min before dinner Novolog 12 units most of the time, sometimes not. Eats dinner She is not injecting her Levemir hardly ever, even now.   Prescribed regimen:  Levemir 30 units in AM, 12  units in PM Novolog 12 units TID before meals.  BP Readings from Last 3 Encounters:  01/29/20 140/68  01/20/20 138/88  12/15/19 (!) 158/74     Review of Systems  Respiratory: Negative for shortness of breath.   Cardiovascular: Negative for chest pain.  Neurological: Negative for dizziness and headaches.       Past Medical History:  Diagnosis Date  . CAD (coronary artery disease)   . History of nuclear stress test 11/22/2011   bruce myoview; normal pattern of perfusio; post-stress EF 76%; low risk scan  . Hyperlipidemia   . Hypertension   . 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 Type 2 diabetes mellitus (HCC)      Social History   Socioeconomic History  . Marital status: Widowed    Spouse name: Not on file  . Number of children: 7  . Years of education: Not on file  . Highest education level: Not on file  Occupational History  . Not on file  Tobacco Use  . Smoking status: Never Smoker  . Smokeless tobacco: Never Used  . Tobacco comment: quit 1960's  smoked very lightly.  Substance and Sexual Activity  . Alcohol use: No    Alcohol/week: 0.0 standard drinks  . Drug use: No  . Sexual activity: Not on file  Other Topics Concern  . Not  on file  Social History Narrative   Married.   Retired.   Works as a Engineer, structural.    Enjoys helping other.    Social Determinants of Health   Financial Resource Strain: Low Risk   . Difficulty of Paying Living Expenses: Not hard at all  Food Insecurity: No Food Insecurity  . Worried About Programme researcher, broadcasting/film/video in the Last Year: Never true  . Ran Out of Food in the Last Year: Never true  Transportation Needs: No Transportation Needs  . Lack of Transportation (Medical): No  . Lack of Transportation (Non-Medical): No  Physical Activity: Insufficiently Active  . Days of Exercise per Week: 2 days  . Minutes of Exercise per Session: 30 min  Stress: No  Stress Concern Present  . Feeling of Stress : Not at all  Social Connections: Not on file  Intimate Partner Violence: Not At Risk  . Fear of Current or Ex-Partner: No  . Emotionally Abused: No  . Physically Abused: No  . Sexually Abused: No    Past Surgical History:  Procedure Laterality Date  . CARDIAC CATHETERIZATION  05/07/2007   EF 55%, focal mild hypocontractility in mid-distal inferior wall & mid posterolateral wall; severe multivessel CAD - susequent CABGx6 (Dr. Bishop Limbo)  . CORONARY ARTERY BYPASS GRAFT  05/09/2007   LIMA to LAD, veing to intermediate; SVG to OM1 & OM2; SVG to acute marginal & distal RCA (Dr. Dorris Fetch)  . TRANSTHORACIC ECHOCARDIOGRAM  02/13/2010   EF =>55%, vigorous contraction EF 65%; LA mild-mod dilated; IV normal diameter - normal CVP; trace MR; mild TR; trace AV regurg    Family History  Problem Relation Age of Onset  . Cancer Mother   . Diabetes Sister   . Cancer Child     No Known Allergies  Current Outpatient Medications on File Prior to Visit  Medication Sig Dispense Refill  . Acetaminophen (TYLENOL 8 HOUR PO) Take 1 tablet by mouth every 8 (eight) hours as needed.     Marland Kitchen aspirin 81 MG tablet Take 81 mg by mouth daily.    . Azelastine HCl 137 MCG/SPRAY SOLN As needed    . BD PEN NEEDLE NANO 2ND GEN 32G X 4 MM MISC USE WITH INSULIN PEN 4 TIMES DAILY. 100 each 5  . glucose blood (ACCU-CHEK GUIDE) test strip Use as instructed to check sugar three times daily 300 each 12  . LEVEMIR FLEXTOUCH 100 UNIT/ML FlexPen INJECT 30 UNITS SUBCUTANEOUSLY IN THE MORNING AND 10 AT BEDTIME 30 mL 2  . losartan (COZAAR) 100 MG tablet TAKE 1 TABLET (100mg ) ONCE DAILY 30 tablet 3  . metoprolol succinate (TOPROL-XL) 50 MG 24 hr tablet TAKE 1 TABLET BY MOUTH IN THE MORNING AND 1/2 (ONE-HALF) IN THE EVENING (NEEDS  TO  SCHEDULE  AN  APPOINTMENT) 135 tablet 1  . Multiple Vitamin (MULTIVITAMIN) capsule Take 1 capsule by mouth daily.    NOVOLOG FLEXPEN 100 UNIT/ML FlexPen  INJECT 6 TO 10 UNITS SUBCUTANEOUSLY THREE TIMES DAILY WITH MEALS 30 mL 0  . pantoprazole (PROTONIX) 40 MG tablet Take 1 tablet by mouth once daily 90 tablet 3  . Polyethyl Glycol-Propyl Glycol (SYSTANE OP) Apply 2 drops to eye 2 (two) times daily.     . prednisoLONE acetate (PRED FORTE) 1 % ophthalmic suspension     . rosuvastatin (CRESTOR) 10 MG tablet Take 1 tablet by mouth once daily 90 tablet 0  . sertraline (ZOLOFT) 25 MG tablet TAKE 1 TABLET BY MOUTH ONCE  DAILY FOR  DEPRESSION 30 tablet 0  . TRAVATAN Z 0.004 % SOLN ophthalmic solution Place 1 drop into both eyes at bedtime.     No current facility-administered medications on file prior to visit.    BP 140/68   Pulse 72   Temp 97.6 F (36.4 C) (Temporal)   Ht 5\' 3"  (1.6 m)   Wt 136 lb (61.7 kg)   SpO2 99%   BMI 24.09 kg/m    Objective:   Physical Exam Constitutional:      Appearance: She is well-nourished.  Cardiovascular:     Rate and Rhythm: Normal rate and regular rhythm.  Pulmonary:     Effort: Pulmonary effort is normal.     Breath sounds: Normal breath sounds.  Musculoskeletal:     Cervical back: Neck supple.  Skin:    General: Skin is warm and dry.  Psychiatric:        Mood and Affect: Mood and affect normal.            Assessment & Plan:

## 2020-01-29 NOTE — Assessment & Plan Note (Signed)
A1C of 8.4 as of recent, increased from September 2021.   Long discussion regarding importance of compliance to her prescribed regimen without her own variations or eliminating doses. Especially with A1C increase to 8.4.

## 2020-01-29 NOTE — Patient Instructions (Signed)
Continue taking Losartan 100 mg everyday for blood pressure.  Continue taking metoprolol succinate 50 mg daily for heart and blood pressure, and 25 mg if needed.  Notify us if you see your blood pressure at or above 140 on top and/or 90 on bottom consistently.  It was a pleasure to see you today!

## 2020-01-29 NOTE — Assessment & Plan Note (Signed)
Stable in the office today given age. Home readings are lower.   Continue losartan 100 mg, metoprolol succinate 50 mg daily and 25 if needed.

## 2020-02-23 DIAGNOSIS — E113313 Type 2 diabetes mellitus with moderate nonproliferative diabetic retinopathy with macular edema, bilateral: Secondary | ICD-10-CM | POA: Diagnosis not present

## 2020-02-26 ENCOUNTER — Telehealth: Payer: Self-pay | Admitting: Primary Care

## 2020-02-26 NOTE — Telephone Encounter (Signed)
Contacted pt who reports she had only contacted the office because she wanted a script for a new BP cuff. She said hers has been malfunctioning for some time now.It has given odd numbers like 145/60 and then 93/69 and then will shut off and not turn back on. Advised it may need batteries. She said she would rather just get a new one.  Pt reports she is not currently having any blurred vision and does not know why the other nurse asked her so many questions and thought she was slurring her speech or seemed confused. She said she is feeling fine and would definitely go to the ER if something was wrong.  She did reports she was out doing some things, running errands, the other day and when she got home she was tired and she is normally never tired. She requested an OV to discuss this to see if maybe she needed to take more vitamins or needed some lab work done.  Scheduled pt for 2/16 and advised if pt was having any concerns to contact the office on Monday or call 911. Pt verbalized understanding.

## 2020-02-26 NOTE — Telephone Encounter (Signed)
Patient is in need a new blood pressure cuff. Patient is requesting an order for one. Please advise patient EM

## 2020-02-26 NOTE — Telephone Encounter (Signed)
[facility] Day - Client TELEPHONE ADVICE RECORD AccessNurse Patient Name: Laura Ferguson Gender: Female DOB: 1932/08/08 Age: 87 Y 8 M 12 D Return Phone Number: [phone] (Primary), [phone] (Secondary) Address[place] [address] Client [facility] Day - Client Client [facility] - Day Physician Vernona Rieger - NP Contact Type Call Who Is Calling Patient / Member / Family / Caregiver Call Type Triage / Clinical Relationship To Patient Self Return Phone Number [phone] (Primary) Chief Complaint SPEAK - sudden inability to talk or slurred speech Reason for Call Symptomatic / Request for Health Information Initial Comment Caller says that she has diabetes. her sugar is 149, and she has burred vision. Per office PT is slurring her words and seems confused, Office said PT had called in to get a new BP cuff. And that her BP is something like 193/59, heart rate is 69. PT says she ot sure of the secondary #: [phone] Translation No Nurse Assessment Nurse: Ladona Ridgel, RN, Felicia Date/Time (Eastern Time): 02/26/2020 3:22:49 PM Confirm and document reason for call. If symptomatic, describe symptoms. ---Pt denies that she is confused at all. Her Address is [address] She says her BP cuff is broken and she wants to get another BP cuff. She had a little blurry vision - but she says it always is and 93/59 was BP 1 hour ago and p 69. Blood sugar 1 hour ago was 149. She thinks her BP cuff is not working. Does the patient have any new or worsening symptoms? ---Yes Will a triage be completed? ---Yes Related visit to physician within the last 2 weeks? ---No Does the PT have any chronic conditions? (i.e. diabetes, asthma, this includes High risk factors for pregnancy, etc.) ---Yes List chronic conditions. ---DM htn open heart surgery in the past Is this a behavioral health or substance  abuse call? ---No Guidelines Guideline Title Affirmed Question Affirmed Notes Nurse Date/Time (Eastern Time) Disp. Time [name] Time) Disposition Final User 02/26/2020 3:15:46 PM Send to Urgent Ladoris Gene 02/26/2020 3:42:01 PM Clinical Call Yes Ladona Ridgel, RN, Felicia PLEASE NOTE: All timestamps contained within this report are represented as [place] Standard Time. CONFIDENTIALTY NOTICE: This fax transmission is intended only for the addressee. It contains information that is legally privileged, confidential or otherwise protected from use or disclosure. If you are not the intended recipient, you are strictly prohibited from reviewing, disclosing, copying using or disseminating any of this information or taking any action in reliance on or regarding this information. If you have received this fax in error, please notify us immediately by telephone so that we can arrange for its return to [place]. Phone: [phone], Toll-Free: [phone], Fax: [phone] Page: 2 of 2 Call Id: [id number] Comments User: Hampton Abbot, RN Date/Time [name] Time): 02/26/2020 3:32:28 PM pt is very irrate that nurse would ask her these questions. She says I walk, I manage my bills, I just pulled the garbage can in to the garage User: Hampton Abbot, RN Date/Time (Eastern Time): 02/26/2020 3:34:03 PM she is emphatic not to answer questions about triage and just wants an appointment her BP goes up and down and she refuses to retake it now and wants another BP cuff - kept telling her if I can ask you a few questions for 5 min the nurse can tell how soon you should be seen but she would not allow nurse to ask the questions User: Hampton Abbot, RN Date/Time [name] Time):  02/26/2020 3:34:22 PM [address] [name] She denies confusion User: Hampton Abbot, RN Date/Time [name] Time): 02/26/2020 3:41:52 PM reached [name] in the office and told her so sorry that could not triage this pt bc she gets upset at the  questions - she just wants appt. Reported that she called bc her BP cuff has fluctuating readings and blurry vision that she says is long standing and MDs know about her vision. She denies confusion and denies trouble walking around. The office manager nurse says (through [facility]) for this nurse to send a copy of conversation to office and they will call the patient back. Told Caller that the office will call her back soon.

## 2020-02-26 NOTE — Telephone Encounter (Signed)
Access Nurse called in stating the patient was transferred to her stating the patient is confused and having difficulty talking and blurry vision. According to nurse patient refused to answer any questions as she said her only issue is her BP cuff not working, but patient reports feeling fine. Patient stated she always has blurry vision, is walking around her house normal, just brought in trash cans. Patient refused triage. Routing to RN to follow up. Access nurse stated she will finish report and fax information to office.

## 2020-02-26 NOTE — Telephone Encounter (Signed)
Access Nurse was told to tell pt that the nurse at the clinic would call pt back because there is only one nurse in the clinic. At the time when Access nurse called this nurse was on the phone with another pt triaging.   LVM

## 2020-02-28 NOTE — Telephone Encounter (Signed)
Noted, will evaluate as scheduled.  

## 2020-03-02 ENCOUNTER — Ambulatory Visit (INDEPENDENT_AMBULATORY_CARE_PROVIDER_SITE_OTHER): Payer: Medicare HMO | Admitting: Primary Care

## 2020-03-02 ENCOUNTER — Encounter: Payer: Self-pay | Admitting: Primary Care

## 2020-03-02 ENCOUNTER — Other Ambulatory Visit: Payer: Self-pay

## 2020-03-02 VITALS — BP 134/78 | HR 82 | Temp 97.6°F | Ht 63.0 in | Wt 137.0 lb

## 2020-03-02 DIAGNOSIS — F411 Generalized anxiety disorder: Secondary | ICD-10-CM

## 2020-03-02 DIAGNOSIS — R5383 Other fatigue: Secondary | ICD-10-CM | POA: Insufficient documentation

## 2020-03-02 LAB — BASIC METABOLIC PANEL
BUN: 38 mg/dL — ABNORMAL HIGH (ref 6–23)
CO2: 26 mEq/L (ref 19–32)
Calcium: 9.5 mg/dL (ref 8.4–10.5)
Chloride: 106 mEq/L (ref 96–112)
Creatinine, Ser: 1.47 mg/dL — ABNORMAL HIGH (ref 0.40–1.20)
GFR: 32.31 mL/min — ABNORMAL LOW (ref >60.00)
Glucose, Bld: 201 mg/dL — ABNORMAL HIGH (ref 70–99)
Potassium: 4.3 mEq/L (ref 3.5–5.1)
Sodium: 138 mEq/L (ref 135–145)

## 2020-03-02 LAB — CBC
HCT: 36.5 % (ref 36.0–46.0)
Hemoglobin: 12.2 g/dL (ref 12.0–15.0)
MCHC: 33.3 g/dL (ref 30.0–36.0)
MCV: 92.3 fl (ref 78.0–100.0)
Platelets: 176 10*3/uL (ref 150.0–400.0)
RBC: 3.96 Mil/uL (ref 3.87–5.11)
RDW: 13.8 % (ref 11.5–15.5)
WBC: 4.9 10*3/uL (ref 4.0–10.5)

## 2020-03-02 LAB — TSH: TSH: 0.52 u[IU]/mL (ref 0.35–4.50)

## 2020-03-02 LAB — VITAMIN B12: Vitamin B-12: 667 pg/mL (ref 211–911)

## 2020-03-02 MED ORDER — ESCITALOPRAM OXALATE 10 MG PO TABS: 10.0000 mg | ORAL_TABLET | Freq: Every day | ORAL | 1 refills | Status: DC

## 2020-03-02 NOTE — Progress Notes (Signed)
Subjective:    Patient ID: Laura Ferguson, female    DOB: 08-Nov-1932, 87 y.o.   MRN: [id number]  HPI  This visit occurred during the SARS-CoV-2 public health emergency.  Safety protocols were in place, including screening questions prior to the visit, additional usage of staff PPE, and extensive cleaning of exam room while observing appropriate contact time as indicated for disinfecting solutions.   Laura Ferguson is a 87 year old female with a history of type 2 diabetes, hypertension, CAD, GAD, chronic back pain who presents today with a chief complaint of fatigue.   Chronic fatigue and weakness intermittent for the last several months. "It just comes and goes". She endorses sleeping for about three hours nightly for the last year. She will "twist and turn all night" has difficulty falling asleep due to mind racing thoughts and will wake during the night.   She goes to bed around 12 am, watches TV in her recliner before bed. Once in bed she will lay there for 2 hours, then falls asleep for 2-3 hours and then will get up around 7a-8a. She believes she has trouble sleeping as she's not used to being alone.   She was treated with sertraline 25 mg in Fall 2021 for depression secondary to the passing of her son, during her last visit she endorsed feeling improved. She endorses today that she stopped taking her sertraline about 1-2 months ago as she had a friend who "lost her mind on that medicine". She did not notify us when this occurred. She continues to struggle with the death of her son, worries about everyone and everything.    She takes regular Tylenol at night which helps her to sleep some.   GAD 7 : Generalized Anxiety Score 03/02/2020  Nervous, Anxious, on Edge 2  Control/stop worrying 3  Worry too much - different things 3  Trouble relaxing 2  Restless 2  Easily annoyed or irritable 0  Afraid - awful might happen 1  Total GAD 7 Score 13  Anxiety Difficulty Not difficult at all     BP  Readings from Last 3 Encounters:  03/02/20 134/78  01/29/20 140/68  01/20/20 138/88     Review of Systems  Constitutional: Positive for fatigue.  Respiratory: Negative for shortness of breath.   Cardiovascular: Negative for chest pain.  Psychiatric/Behavioral: Positive for sleep disturbance. The patient is nervous/anxious.        Past Medical History:  Diagnosis Date  . CAD (coronary artery disease)   . History of nuclear stress test 11/22/2011   bruce myoview; normal pattern of perfusio; post-stress EF 76%; low risk scan  . Hyperlipidemia   . Hypertension   . 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 Type 2 diabetes mellitus (HCC)      Social History   Socioeconomic History  . Marital status: Widowed    Spouse name: Not on file  . Number of children: 7  . Years of education: Not on file  . Highest education level: Not on file  Occupational History  . Not on file  Tobacco Use  . Smoking status: Never Smoker  . Smokeless tobacco: Never Used  . Tobacco comment: quit 1960's  smoked very lightly.  Substance and Sexual Activity  . Alcohol use: No    Alcohol/week: 0.0 standard drinks  . Drug use: No  .  Sexual activity: Not on file  Other Topics Concern  . Not on file  Social History Narrative   Married.   Retired.   Works as a Engineer, structural.    Enjoys helping other.    Social Determinants of Health   Financial Resource Strain: Low Risk   . Difficulty of Paying Living Expenses: Not hard at all  Food Insecurity: No Food Insecurity  . Worried About Programme researcher, broadcasting/film/video in the Last Year: Never true  . Ran Out of Food in the Last Year: Never true  Transportation Needs: No Transportation Needs  . Lack of Transportation (Medical): No  . Lack of Transportation (Non-Medical): No  Physical Activity: Insufficiently Active  . Days of Exercise per Week: 2 days  . Minutes of Exercise per Session:  30 min  Stress: No Stress Concern Present  . Feeling of Stress : Not at all  Social Connections: Not on file  Intimate Partner Violence: Not At Risk  . Fear of Current or Ex-Partner: No  . Emotionally Abused: No  . Physically Abused: No  . Sexually Abused: No    Past Surgical History:  Procedure Laterality Date  . CARDIAC CATHETERIZATION  05/07/2007   EF 55%, focal mild hypocontractility in mid-distal inferior wall & mid posterolateral wall; severe multivessel CAD - susequent CABGx6 (Dr. Bishop Limbo)  . CORONARY ARTERY BYPASS GRAFT  05/09/2007   LIMA to LAD, veing to intermediate; SVG to OM1 & OM2; SVG to acute marginal & distal RCA (Dr. Dorris Fetch)  . TRANSTHORACIC ECHOCARDIOGRAM  02/13/2010   EF =>55%, vigorous contraction EF 65%; LA mild-mod dilated; IV normal diameter - normal CVP; trace MR; mild TR; trace AV regurg    Family History  Problem Relation Age of Onset  . Cancer Mother   . Diabetes Sister   . Cancer Child     No Known Allergies  Current Outpatient Medications on File Prior to Visit  Medication Sig Dispense Refill  . Acetaminophen (TYLENOL 8 HOUR PO) Take 1 tablet by mouth every 8 (eight) hours as needed.     Marland Kitchen aspirin 81 MG tablet Take 81 mg by mouth daily.    . Azelastine HCl 137 MCG/SPRAY SOLN As needed    . BD PEN NEEDLE NANO 2ND GEN 32G X 4 MM MISC USE WITH INSULIN PEN 4 TIMES DAILY. 100 each 5  . glucose blood (ACCU-CHEK GUIDE) test strip Use as instructed to check sugar three times daily 300 each 12  . LEVEMIR FLEXTOUCH 100 UNIT/ML FlexPen INJECT 30 UNITS SUBCUTANEOUSLY IN THE MORNING AND 10 AT BEDTIME 30 mL 2  . losartan (COZAAR) 100 MG tablet TAKE 1 TABLET (100mg ) ONCE DAILY 30 tablet 3  . metoprolol succinate (TOPROL-XL) 50 MG 24 hr tablet TAKE 1 TABLET BY MOUTH IN THE MORNING AND 1/2 (ONE-HALF) IN THE EVENING (NEEDS  TO  SCHEDULE  AN  APPOINTMENT) 135 tablet 1  . Multiple Vitamin (MULTIVITAMIN) capsule Take 1 capsule by mouth daily.    NOVOLOG FLEXPEN  100 UNIT/ML FlexPen INJECT 6 TO 10 UNITS SUBCUTANEOUSLY THREE TIMES DAILY WITH MEALS 30 mL 0  . pantoprazole (PROTONIX) 40 MG tablet Take 1 tablet by mouth once daily 90 tablet 3  . Polyethyl Glycol-Propyl Glycol (SYSTANE OP) Apply 2 drops to eye 2 (two) times daily.     . prednisoLONE acetate (PRED FORTE) 1 % ophthalmic suspension     . rosuvastatin (CRESTOR) 10 MG tablet Take 1 tablet by mouth once daily 90 tablet 0  .  TRAVATAN Z 0.004 % SOLN ophthalmic solution Place 1 drop into both eyes at bedtime.     No current facility-administered medications on file prior to visit.    BP 134/78   Pulse 82   Temp 97.6 F (36.4 C) (Temporal)   Ht 5\' 3"  (1.6 m)   Wt 137 lb (62.1 kg)   SpO2 97%   BMI 24.27 kg/m    Objective:   Physical Exam Constitutional:      Appearance: She is well-nourished.  Cardiovascular:     Rate and Rhythm: Normal rate and regular rhythm.  Pulmonary:     Effort: Pulmonary effort is normal.     Breath sounds: Normal breath sounds.  Musculoskeletal:     Cervical back: Neck supple.  Skin:    General: Skin is warm and dry.  Psychiatric:        Mood and Affect: Mood and affect and mood normal.            Assessment & Plan:

## 2020-03-02 NOTE — Patient Instructions (Addendum)
Stop by the lab prior to leaving today. I will notify you of your results once received.   Start escitalopram (Lexapro) 10 mg for anxiety and depression.   Please schedule a follow up visit for 6 weeks for follow up of anxiety/depression.  It was a pleasure to see you today!

## 2020-03-02 NOTE — Assessment & Plan Note (Signed)
Patient discontinued sertraline without our notification.  Still unclear of the reason for her discontinuation, she endorsed feeling improved several months ago.  Suspect her fatigue and insomnia are secondary to anxiety.  Long conversation today regarding her symptoms, she agrees to try Lexapro 10 mg for her symptoms.  Prescription sent to pharmacy.  She will follow up in 6 weeks.

## 2020-03-02 NOTE — Assessment & Plan Note (Signed)
Poor historian, but from what I can gauge it seems like her fatigue is secondary to anxiety and grief from the loss of her son.  We will check some labs today to rule out other causes.  We will treat anxiety with Lexapro 10 mg daily.  We will follow back up in 6 weeks.

## 2020-03-16 ENCOUNTER — Telehealth: Payer: Self-pay | Admitting: Primary Care

## 2020-03-16 NOTE — Telephone Encounter (Signed)
error 

## 2020-04-05 DIAGNOSIS — E113313 Type 2 diabetes mellitus with moderate nonproliferative diabetic retinopathy with macular edema, bilateral: Secondary | ICD-10-CM | POA: Diagnosis not present

## 2020-04-05 DIAGNOSIS — H40003 Preglaucoma, unspecified, bilateral: Secondary | ICD-10-CM | POA: Diagnosis not present

## 2020-04-10 ENCOUNTER — Other Ambulatory Visit: Payer: Self-pay | Admitting: Cardiovascular Disease

## 2020-04-10 DIAGNOSIS — E785 Hyperlipidemia, unspecified: Secondary | ICD-10-CM

## 2020-04-12 ENCOUNTER — Other Ambulatory Visit: Payer: Self-pay | Admitting: Cardiovascular Disease

## 2020-04-12 ENCOUNTER — Telehealth: Payer: Self-pay | Admitting: Cardiovascular Disease

## 2020-04-12 DIAGNOSIS — I10 Essential (primary) hypertension: Secondary | ICD-10-CM

## 2020-04-12 MED ORDER — LOSARTAN POTASSIUM 100 MG PO TABS: ORAL_TABLET | ORAL | 2 refills | Status: DC

## 2020-04-12 NOTE — Telephone Encounter (Signed)
*  STAT* If patient is at the pharmacy, call can be transferred to refill team.   1. Which medications need to be refilled? (please list name of each medication and dose if known) losartan (COZAAR) 100 MG tablet   2. Which pharmacy/location (including street and city if local pharmacy) is medication to be sent to? [id number] - [place], [place] - [address]  3. Do they need a 30 day or 90 day supply? 90 day supply

## 2020-04-19 DIAGNOSIS — H35033 Hypertensive retinopathy, bilateral: Secondary | ICD-10-CM | POA: Diagnosis not present

## 2020-04-19 DIAGNOSIS — H40023 Open angle with borderline findings, high risk, bilateral: Secondary | ICD-10-CM | POA: Diagnosis not present

## 2020-04-19 DIAGNOSIS — E113393 Type 2 diabetes mellitus with moderate nonproliferative diabetic retinopathy without macular edema, bilateral: Secondary | ICD-10-CM | POA: Diagnosis not present

## 2020-04-19 DIAGNOSIS — H35373 Puckering of macula, bilateral: Secondary | ICD-10-CM | POA: Diagnosis not present

## 2020-04-26 DIAGNOSIS — E113513 Type 2 diabetes mellitus with proliferative diabetic retinopathy with macular edema, bilateral: Secondary | ICD-10-CM | POA: Diagnosis not present

## 2020-05-04 DIAGNOSIS — J301 Allergic rhinitis due to pollen: Secondary | ICD-10-CM | POA: Diagnosis not present

## 2020-05-04 DIAGNOSIS — R42 Dizziness and giddiness: Secondary | ICD-10-CM | POA: Diagnosis not present

## 2020-05-04 DIAGNOSIS — Z0100 Encounter for examination of eyes and vision without abnormal findings: Secondary | ICD-10-CM | POA: Diagnosis not present

## 2020-05-19 ENCOUNTER — Encounter: Payer: Self-pay | Admitting: Internal Medicine

## 2020-05-19 ENCOUNTER — Ambulatory Visit: Payer: Medicare HMO | Admitting: Internal Medicine

## 2020-05-19 ENCOUNTER — Other Ambulatory Visit: Payer: Self-pay

## 2020-05-19 VITALS — BP 120/88 | HR 75 | Ht 63.0 in | Wt 139.6 lb

## 2020-05-19 DIAGNOSIS — E785 Hyperlipidemia, unspecified: Secondary | ICD-10-CM

## 2020-05-19 DIAGNOSIS — Z794 Long term (current) use of insulin: Secondary | ICD-10-CM | POA: Diagnosis not present

## 2020-05-19 DIAGNOSIS — E1159 Type 2 diabetes mellitus with other circulatory complications: Secondary | ICD-10-CM | POA: Diagnosis not present

## 2020-05-19 LAB — POCT GLYCOSYLATED HEMOGLOBIN (HGB A1C): Hemoglobin A1C: 7.9 % — AB (ref 4.0–5.6)

## 2020-05-19 NOTE — Patient Instructions (Signed)
Please continue: - Levemir 30 units in a.m. and 10 units at bedtime - Novolog 12-15 units 15 min before meals  DO NOT CORRECT the blood sugars after meals, and then, only use up to 5 units.  Please return in 4 months with your sugar log.

## 2020-05-19 NOTE — Progress Notes (Signed)
atient ID: Laura Ferguson, female   DOB: 1932-07-18, 87 y.o.   MRN: [id number]   This visit occurred during the SARS-CoV-2 public health emergency.  Safety protocols were in place, including screening questions prior to the visit, additional usage of staff PPE, and extensive cleaning of exam room while observing appropriate contact time as indicated for disinfecting solutions.   HPI: Laura Ferguson is a 87 y.o.-year-old female, returning for f/u for DM2, dx in 1990s, insulin-dependent for years, uncontrolled, with complications (CAD - s/p CABG x6, CKD, DR, PN). Last visit 4 months ago.  Interim hx: Since last visit she mentions weight gain, but this is not seen on our scale today. Otherwise, she is feeling well, without complaints.  Reviewed HbA1c levels: Lab Results  Component Value Date   HGBA1C 8.4 (A) 01/20/2020   HGBA1C 8.1 (A) 09/23/2019   HGBA1C 8.5 (A) 06/18/2019   HGBA1C 7.9 (A) 02/17/2019   HGBA1C 8.4 (A) 10/16/2018   HGBA1C 8.8 (A) 06/16/2018   HGBA1C 8.9 (A) 01/29/2018   HGBA1C 9.4 (A) 10/15/2017   HGBA1C 8.5 04/18/2017   HGBA1C 9.1 01/17/2017   HGBA1C 8.8 10/16/2016   HGBA1C 11.7 (H) 04/02/2016   HGBA1C 10.0 (H) 01/03/2016   HGBA1C 9.5 (H) 09/30/2015   HGBA1C 16.2 (H) 06/27/2015   HGBA1C 14.3 (H) 03/21/2015   HGBA1C (H) 05/08/2007    9.6 (NOTE)   The ADA recommends the following therapeutic goals for glycemic   control related to Hgb A1C measurement:   Goal of Therapy:   < 7.0% Hgb A1C   Action Suggested:  > 8.0% Hgb A1C   Ref:  Diabetes Care, 22, Suppl. 1, 1999   HGBA1C (H) 05/07/2007    9.8 (NOTE)   The ADA recommends the following therapeutic goals for glycemic   control related to Hgb A1C measurement:   Goal of Therapy:   < 7.0% Hgb A1C   Action Suggested:  > 8.0% Hgb A1C   Ref:  Diabetes Care, 22, Suppl. 1, 1999   She is usually not following the recommended regimen despite repeated advice at every visit: In the past, she was on: - Levemir 30 units in a.m. and 15  units units at bedtime (but skips it if sugars are at goal!!! - takes it maybe 2x a week) - Novolog 12-15 units 15 min before meals (but actually takes 12 units in am and 10 units before lunch and dinner)  At last OV, I advised her to use: - Levemir 30 units in a.m. and only occasionally, 12 units units at bedtime - Novolog 12 units 15 min before meals  However, at this visit: - Levemir 30 units in a.m. >> not taking - Novolog 12-15 units 15 min before meals - only using 6-12 units and misses doses  She checks sugars 3 times a day per her meter download: - am:  189-274 >> 195-358 >> 135, 162-257, 340 >> 123-316 - 2h after b'fast: 64, 83-189, 207, 253 >> n/c >> 59, 88, 200, 266 >> n/c - before lunch:  184, 200-324, 387 >> 170-290 >> 271, 299 >> 85-343 - 2h after lunch: 185 >> 277-304, 378 >> 93-176 >> ? >> 356 >> 194-283 - before dinner: 128-386 >> 93-247 >> 95, 136-267, 345 >> 107-349 - 2h after dinner: 149-321 >> 86, 122-311 >> 105, 235 >> 75, 142-321 >> 60-228 - bedtime: 217, 209 >> n/c >> 80-178  >> n/c - nighttime: n/c >> 136-157 >> 118-176 >> 135-268, 312 >>  176, 215 Lowest sugar was 59 >> 59 >> 75 >> 60; it is unclear at which level she has hypoglycemia awareness Highest sugar was 451 ... >> 358 >> 340 >> 349.  Glucometer: AccuChek  Pt's meals are: - Breakfast: oatmeal, grits, apple sauce - Lunch: tuna sandwich, bologna - Dinner: soup, sandwich  -+ CKD, last BUN/creatinine:  Lab Results  Component Value Date   BUN 38 (H) 03/02/2020   BUN 31 (H) 09/07/2019   CREATININE 1.47 (H) 03/02/2020   CREATININE 1.25 (H) 09/07/2019  On losartan 75. -+ HL; last set of lipids: Lab Results  Component Value Date   CHOL 134 09/07/2019   HDL 61.60 09/07/2019   LDLCALC 55 09/07/2019   TRIG 90.0 09/07/2019   CHOLHDL 2 09/07/2019  On Crestor 10.  Cardiologist: Dr. Tresa EndoKelly.  - last eye exam was in 05/2019: + DR, she gets intraocular injections. Dr. Tomie ChinaZane. - she has numbness and tingling  in her feet.  She also has HTN.  Both of her sons died in the 2 weeks in 2021: one of Covid, one unexpectedly!   ROS: Constitutional: + weight gain/no weight loss, no fatigue, no subjective hyperthermia, no subjective hypothermia Eyes: no blurry vision, no xerophthalmia ENT: no sore throat, no nodules palpated in neck, no dysphagia, no odynophagia, no hoarseness Cardiovascular: no CP/no SOB/no palpitations/no leg swelling Respiratory: no cough/no SOB/no wheezing Gastrointestinal: no N/no V/no D/no C/no acid reflux Musculoskeletal: no muscle aches/no joint aches Skin: no rashes, no hair loss Neurological: no tremors/+ numbness/+ tingling/+ dizziness (sinus inf.)  I reviewed pt's medications, allergies, PMH, social hx, family hx, and changes were documented in the history of present illness. Otherwise, unchanged from my initial visit note.  Past Medical History:  Diagnosis Date  . CAD (coronary artery disease)   . History of nuclear stress test 11/22/2011   bruce myoview; normal pattern of perfusio; post-stress EF 76%; low risk scan  . Hyperlipidemia   . Hypertension   . 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 Type 2 diabetes mellitus (HCC)    Past Surgical History:  Procedure Laterality Date  . CARDIAC CATHETERIZATION  05/07/2007   EF 55%, focal mild hypocontractility in mid-distal inferior wall & mid posterolateral wall; severe multivessel CAD - susequent CABGx6 (Dr. Bishop Limbo. Kelly)  . CORONARY ARTERY BYPASS GRAFT  05/09/2007   LIMA to LAD, veing to intermediate; SVG to OM1 & OM2; SVG to acute marginal & distal RCA (Dr. Dorris FetchHendrickson)  . TRANSTHORACIC ECHOCARDIOGRAM  02/13/2010   EF =>55%, vigorous contraction EF 65%; LA mild-mod dilated; IV normal diameter - normal CVP; trace MR; mild TR; trace AV regurg   Social History   Social History  . Marital status: Widowed    Spouse name: N/A  . Number of  children: 8   Occupational History  . homemaker   Social History Main Topics  . Smoking status: Never Smoker  . Smokeless tobacco: Never Used     Comment: quit 1960's  smoked very lightly.  . Alcohol use No  . Drug use: No     Social History Narrative   Married.   Retired.   Works as a Engineer, structuralCaregiver.    Enjoys helping other.    Current Outpatient Medications on File Prior to Visit  Medication Sig Dispense Refill  . Acetaminophen (TYLENOL 8 HOUR PO) Take 1 tablet by mouth every  8 (eight) hours as needed.     Marland Kitchen aspirin 81 MG tablet Take 81 mg by mouth daily.    . Azelastine HCl 137 MCG/SPRAY SOLN As needed    . BD PEN NEEDLE NANO 2ND GEN 32G X 4 MM MISC USE WITH INSULIN PEN 4 TIMES DAILY. 100 each 5  . escitalopram (LEXAPRO) 10 MG tablet Take 1 tablet (10 mg total) by mouth daily. For depression and anxiety. 30 tablet 1  . glucose blood (ACCU-CHEK GUIDE) test strip Use as instructed to check sugar three times daily 300 each 12  . LEVEMIR FLEXTOUCH 100 UNIT/ML FlexPen INJECT 30 UNITS SUBCUTANEOUSLY IN THE MORNING AND 10 AT BEDTIME 30 mL 2  . losartan (COZAAR) 100 MG tablet Take 1 tablet by mouth once daily 90 tablet 2  . metoprolol succinate (TOPROL-XL) 50 MG 24 hr tablet TAKE 1 TABLET BY MOUTH IN THE MORNING AND 1/2 (ONE-HALF) IN THE EVENING (NEEDS  TO  SCHEDULE  AN  APPOINTMENT) 135 tablet 1  . Multiple Vitamin (MULTIVITAMIN) capsule Take 1 capsule by mouth daily.    Marland Kitchen NOVOLOG FLEXPEN 100 UNIT/ML FlexPen INJECT 6 TO 10 UNITS SUBCUTANEOUSLY THREE TIMES DAILY WITH MEALS 30 mL 0  . pantoprazole (PROTONIX) 40 MG tablet Take 1 tablet by mouth once daily 90 tablet 3  . Polyethyl Glycol-Propyl Glycol (SYSTANE OP) Apply 2 drops to eye 2 (two) times daily.     . prednisoLONE acetate (PRED FORTE) 1 % ophthalmic suspension     . rosuvastatin (CRESTOR) 10 MG tablet Take 1 tablet by mouth once daily 90 tablet 3  . TRAVATAN Z 0.004 % SOLN ophthalmic solution Place 1 drop into both eyes at bedtime.      No current facility-administered medications on file prior to visit.   No Known Allergies   Family History  Problem Relation Age of Onset  . Cancer Mother   . Diabetes Sister   . Cancer Child    Pt has FH of DM in [place], sister - died from DM complications (refused to start HD), sons - 1 son died from DM complications.  Her youngest son died February 22, 2017 (cancer). He was 53.  PE: BP 120/88 (BP Location: Right Arm, Patient Position: Sitting, Cuff Size: Normal)   Pulse 75   Ht 5\' 3"  (1.6 m)   Wt 139 lb 9.6 oz (63.3 kg)   SpO2 99%   BMI 24.73 kg/m  Wt Readings from Last 3 Encounters:  05/19/20 139 lb 9.6 oz (63.3 kg)  03/02/20 137 lb (62.1 kg)  01/29/20 136 lb (61.7 kg)   Constitutional: normal weight, in NAD Eyes: PERRLA, EOMI, no exophthalmos ENT: moist mucous membranes, no thyromegaly, no cervical lymphadenopathy Cardiovascular: RRR, No MRG Respiratory: CTA B Gastrointestinal: abdomen soft, NT, ND, BS+ Musculoskeletal: no deformities, strength intact in all 4 Skin: moist, warm, no rashes Neurological: no tremor with outstretched hands, DTR normal in all 4  ASSESSMENT: 1. DM2, insulin-dependent, uncontrolled, with complications - CAD - s/p CABG x6 - CKD - DR - PN  2. HL  3. PN  PLAN:  1. Patient with longstanding, uncontrolled, type 2 diabetes, on basal-bolus insulin regimen, with previous deterioration of her diabetes control around the time of her sons' deaths.  However, the major hurdle in her diabetes care is that she is not usually follow the recommended regimen, missing Levemir doses at night despite repeated advised to always take this, and also skipping NovoLog.  At this visit, she also tells me that she  is taking a lower dose than recommended and she sometimes splits the dose taking half of the dose before dinner and half afterwards.  As a consequence, her sugars are very variable, as low as upper 50s per her report (60 in the last 2 weeks) and as high as 300s.   In the last week, sugars in the morning have been high, in the 200s as she is not taking the Levemir most nights.  -At this visit, I again explained the rationale for all of her insulin doses, but she does not appear to understand why it is mandatory to take the Levemir at night even though she may need a lower dose (today I advised her to take only 10 units) and advised important to take NovoLog before every meals and also not to split doses.  We discussed that unfortunately, if she is not following instructions, I would not be able to help her and she should not schedule another appointment. - I suggested to:  Patient Instructions  Please continue: - Levemir 30 units in a.m. and 10 units at bedtime - Novolog 12-15 units 15 min before meals  DO NOT CORRECT the blood sugars after meals, and then, only use up to 5 units.  Please return in 4 months with your sugar log.  - we checked her HbA1c: 7.9% (lower) - advised to check sugars at different times of the day - 3x a day, rotating check times - advised for yearly eye exams >> she is UTD - return to clinic in 3-4 months   2. HL -Reviewed latest lipid panel from 08/2019: All fractions at goal: Lab Results  Component Value Date   CHOL 134 09/07/2019   HDL 61.60 09/07/2019   LDLCALC 55 09/07/2019   TRIG 90.0 09/07/2019   CHOLHDL 2 09/07/2019  -Continues Crestor 10 mg daily without side effects   Carlus Pavlov, MD PhD [facility] Endocrinology

## 2020-05-19 NOTE — Addendum Note (Signed)
Addended by: Kenyon Ana on: 05/19/2020 11:16 AM   Modules accepted: Orders

## 2020-05-24 DIAGNOSIS — H35373 Puckering of macula, bilateral: Secondary | ICD-10-CM | POA: Diagnosis not present

## 2020-05-24 DIAGNOSIS — E113513 Type 2 diabetes mellitus with proliferative diabetic retinopathy with macular edema, bilateral: Secondary | ICD-10-CM | POA: Diagnosis not present

## 2020-05-26 ENCOUNTER — Ambulatory Visit: Payer: Medicare HMO | Admitting: Internal Medicine

## 2020-06-07 ENCOUNTER — Telehealth: Payer: Self-pay | Admitting: *Deleted

## 2020-06-07 NOTE — Telephone Encounter (Signed)
[facility] - Client TELEPHONE ADVICE RECORD AccessNurse Patient Name: Laura Ferguson Gender: Female DOB: 04-Jan-1933 Age: 87 Y 11 M 24 D Return Phone Number: [phone] (Primary) Address: City/ State/ Zip: [place] [address] Client [facility] - Client Client Site  [facility] - Day Physician Vernona Rieger - NP Contact Type Call Who Is Calling Patient / Member / Family / Caregiver Call Type Triage / Clinical Relationship To Patient Self Return Phone Number [phone] (Primary) Chief Complaint Blood Pressure Low Reason for Call Symptomatic / Request for Health Information Initial Comment Caller states, pt having issues with bp, something wrong with the meter. Pt has no symptoms. 116/78 pulse 80. Translation No Nurse Assessment Nurse: Alexander Mt, RN, Nicholaus Bloom Date/Time (Eastern Time): 06/07/2020 11:33:34 AM Confirm and document reason for call. If symptomatic, describe symptoms. ---Caller states she is having issues with her Blood pressure cuff thinks something wrong with the meter. Pt has no symptoms. BP116/78 this morning pulse 80. She is on metoprolol and losartan. Does the patient have any new or worsening symptoms? ---No Please document clinical information provided and list any resource used. ---instructed to try and replace batteries. Disp. Time [name] Time) Disposition Final User 06/07/2020 11:37:55 AM Clinical Call Yes Alexander Mt, RN, Nicholaus Bloom

## 2020-06-07 NOTE — Telephone Encounter (Signed)
Spoke to patient by telephone and was advised that she had the batteries wrong in her blood pressure machine. Patient stated now her blood pressure is 135/76 and heart rate 64. Patient stated that she feels fine now and does not have any symptoms. Patient stated that she has already scheduled an appointment with Jae Dire tomorrow. Patient said that she will keep her appointment with Mayra Reel NP tomorrow and will bring her machine with her.

## 2020-06-07 NOTE — Telephone Encounter (Signed)
Noted, will evaluate. 

## 2020-06-08 ENCOUNTER — Ambulatory Visit (INDEPENDENT_AMBULATORY_CARE_PROVIDER_SITE_OTHER): Payer: Medicare HMO | Admitting: Primary Care

## 2020-06-08 ENCOUNTER — Encounter: Payer: Self-pay | Admitting: Primary Care

## 2020-06-08 ENCOUNTER — Other Ambulatory Visit: Payer: Self-pay

## 2020-06-08 DIAGNOSIS — I10 Essential (primary) hypertension: Secondary | ICD-10-CM | POA: Diagnosis not present

## 2020-06-08 DIAGNOSIS — F411 Generalized anxiety disorder: Secondary | ICD-10-CM | POA: Diagnosis not present

## 2020-06-08 NOTE — Progress Notes (Signed)
Subjective:    Patient ID: Laura Ferguson, female    DOB: 1932-07-04, 87 y.o.   MRN: [id number]  HPI  Laura Ferguson is a very pleasant 87 y.o. female with a history of type 2 diabetes, CAD, hypertension, GAD, fatigue who presents today for follow up of blood pressure.  She called our office yesterday, reached the triage line with reports of difficulties with her blood pressure meter. She saw one isolated readings Our nurse called patient who endorsed she fixed the problem and that BP was 135/76 with HR of 64. She felt well, had no symptoms, but was scheduled today.   She is currently managed on metoprolol succinate 50 mg and losartan 100 mg. Today she endorses doing well. She denies chest pain, shortness of breath. She has noticed some dizziness infrequently, cannot recall during which scenarios she feels dizzy.   Her BP machine has worked well since she changed the batteries. A1C of 7.9 a few weeks ago. She's checking her blood sugars 1-2 times daily which runs in the 130's. She is feeling better in terms of anxiety and depression, is no longer on treatment and is "feeling good!".   BP Readings from Last 3 Encounters:  06/08/20 130/76  05/19/20 120/88  03/02/20 134/78      Review of Systems  Eyes: Negative for visual disturbance.  Respiratory: Negative for shortness of breath.   Cardiovascular: Negative for chest pain.  Neurological: Negative for headaches.         Past Medical History:  Diagnosis Date  . CAD (coronary artery disease)   . History of nuclear stress test 11/22/2011   bruce myoview; normal pattern of perfusio; post-stress EF 76%; low risk scan  . Hyperlipidemia   . Hypertension   . 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 Type 2 diabetes mellitus (HCC)     Social History   Socioeconomic History  . Marital status: Widowed    Spouse name: Not on file  . Number of children: 7   . Years of education: Not on file  . Highest education level: Not on file  Occupational History  . Not on file  Tobacco Use  . Smoking status: Never Smoker  . Smokeless tobacco: Never Used  . Tobacco comment: quit 1960's  smoked very lightly.  Substance and Sexual Activity  . Alcohol use: No    Alcohol/week: 0.0 standard drinks  . Drug use: No  . Sexual activity: Not on file  Other Topics Concern  . Not on file  Social History Narrative   Married.   Retired.   Works as a Engineer, structural.    Enjoys helping other.    Social Determinants of Health   Financial Resource Strain: Not on file  Food Insecurity: Not on file  Transportation Needs: Not on file  Physical Activity: Not on file  Stress: Not on file  Social Connections: Not on file  Intimate Partner Violence: Not on file    Past Surgical History:  Procedure Laterality Date  . CARDIAC CATHETERIZATION  05/07/2007   EF 55%, focal mild hypocontractility in mid-distal inferior wall & mid posterolateral wall; severe multivessel CAD - susequent CABGx6 (Dr. Bishop Limbo)  . CORONARY ARTERY BYPASS GRAFT  05/09/2007   LIMA to LAD, veing to intermediate; SVG to OM1 & OM2; SVG to acute marginal & distal RCA (Dr. Dorris Fetch)  .  TRANSTHORACIC ECHOCARDIOGRAM  02/13/2010   EF =>55%, vigorous contraction EF 65%; LA mild-mod dilated; IV normal diameter - normal CVP; trace MR; mild TR; trace AV regurg    Family History  Problem Relation Age of Onset  . Cancer Mother   . Diabetes Sister   . Cancer Child     No Known Allergies  Current Outpatient Medications on File Prior to Visit  Medication Sig Dispense Refill  . Acetaminophen (TYLENOL 8 HOUR PO) Take 1 tablet by mouth every 8 (eight) hours as needed.     Marland Kitchen aspirin 81 MG tablet Take 81 mg by mouth daily.    . Azelastine HCl 137 MCG/SPRAY SOLN As needed    . BD PEN NEEDLE NANO 2ND GEN 32G X 4 MM MISC USE WITH INSULIN PEN 4 TIMES DAILY. 100 each 5  . escitalopram (LEXAPRO) 10 MG tablet  Take 1 tablet (10 mg total) by mouth daily. For depression and anxiety. 30 tablet 1  . glucose blood (ACCU-CHEK GUIDE) test strip Use as instructed to check sugar three times daily 300 each 12  . LEVEMIR FLEXTOUCH 100 UNIT/ML FlexPen INJECT 30 UNITS SUBCUTANEOUSLY IN THE MORNING AND 10 AT BEDTIME 30 mL 2  . losartan (COZAAR) 100 MG tablet Take 1 tablet by mouth once daily 90 tablet 2  . metoprolol succinate (TOPROL-XL) 50 MG 24 hr tablet TAKE 1 TABLET BY MOUTH IN THE MORNING AND 1/2 (ONE-HALF) IN THE EVENING (NEEDS  TO  SCHEDULE  AN  APPOINTMENT) 135 tablet 1  . Multiple Vitamin (MULTIVITAMIN) capsule Take 1 capsule by mouth daily.    Marland Kitchen NOVOLOG FLEXPEN 100 UNIT/ML FlexPen INJECT 6 TO 10 UNITS SUBCUTANEOUSLY THREE TIMES DAILY WITH MEALS 30 mL 0  . pantoprazole (PROTONIX) 40 MG tablet Take 1 tablet by mouth once daily 90 tablet 3  . Polyethyl Glycol-Propyl Glycol (SYSTANE OP) Apply 2 drops to eye 2 (two) times daily.     . prednisoLONE acetate (PRED FORTE) 1 % ophthalmic suspension     . rosuvastatin (CRESTOR) 10 MG tablet Take 1 tablet by mouth once daily 90 tablet 3  . TRAVATAN Z 0.004 % SOLN ophthalmic solution Place 1 drop into both eyes at bedtime.     No current facility-administered medications on file prior to visit.    BP 130/76 (BP Location: Left Arm, Patient Position: Sitting, Cuff Size: Normal)   Pulse (!) 102   Temp 97.7 F (36.5 C) (Temporal)   Ht 5\' 3"  (1.6 m)   Wt 136 lb (61.7 kg)   SpO2 99%   BMI 24.09 kg/m  Objective:   Physical Exam Cardiovascular:     Rate and Rhythm: Normal rate and regular rhythm.  Pulmonary:     Effort: Pulmonary effort is normal.     Breath sounds: Normal breath sounds.  Musculoskeletal:     Cervical back: Neck supple.  Skin:    General: Skin is warm and dry.           Assessment & Plan:      This visit occurred during the SARS-CoV-2 public health emergency.  Safety protocols were in place, including screening questions prior to  the visit, additional usage of staff PPE, and extensive cleaning of exam room while observing appropriate contact time as indicated for disinfecting solutions.

## 2020-06-08 NOTE — Assessment & Plan Note (Signed)
Well controlled in the office today, home readings improved.   Continue losartan 100 mg and metoprolol succinate 50 mg. She will monitor at home and notify if she continues to see high readings.

## 2020-06-08 NOTE — Patient Instructions (Signed)
It was a pleasure to see you today!   

## 2020-06-08 NOTE — Assessment & Plan Note (Signed)
Appears improved, endorses she's doing well. Continue to monitor.

## 2020-06-09 ENCOUNTER — Ambulatory Visit: Payer: Medicare HMO | Admitting: Internal Medicine

## 2020-06-21 DIAGNOSIS — H35373 Puckering of macula, bilateral: Secondary | ICD-10-CM | POA: Diagnosis not present

## 2020-06-21 DIAGNOSIS — E113513 Type 2 diabetes mellitus with proliferative diabetic retinopathy with macular edema, bilateral: Secondary | ICD-10-CM | POA: Diagnosis not present

## 2020-06-22 ENCOUNTER — Other Ambulatory Visit: Payer: Self-pay | Admitting: Cardiovascular Disease

## 2020-06-23 ENCOUNTER — Other Ambulatory Visit: Payer: Self-pay

## 2020-06-23 ENCOUNTER — Ambulatory Visit (INDEPENDENT_AMBULATORY_CARE_PROVIDER_SITE_OTHER): Payer: Medicare HMO | Admitting: Cardiovascular Disease

## 2020-06-23 ENCOUNTER — Encounter: Payer: Self-pay | Admitting: Cardiovascular Disease

## 2020-06-23 VITALS — BP 157/70 | HR 64 | Ht 63.0 in | Wt 139.4 lb

## 2020-06-23 DIAGNOSIS — K219 Gastro-esophageal reflux disease without esophagitis: Secondary | ICD-10-CM | POA: Diagnosis not present

## 2020-06-23 DIAGNOSIS — Z794 Long term (current) use of insulin: Secondary | ICD-10-CM | POA: Diagnosis not present

## 2020-06-23 DIAGNOSIS — E785 Hyperlipidemia, unspecified: Secondary | ICD-10-CM | POA: Diagnosis not present

## 2020-06-23 DIAGNOSIS — I2510 Atherosclerotic heart disease of native coronary artery without angina pectoris: Secondary | ICD-10-CM

## 2020-06-23 DIAGNOSIS — Z951 Presence of aortocoronary bypass graft: Secondary | ICD-10-CM

## 2020-06-23 DIAGNOSIS — E1159 Type 2 diabetes mellitus with other circulatory complications: Secondary | ICD-10-CM | POA: Diagnosis not present

## 2020-06-23 DIAGNOSIS — I10 Essential (primary) hypertension: Secondary | ICD-10-CM

## 2020-06-23 NOTE — Patient Instructions (Signed)
Medication Instructions:  Your physician recommends that you continue on your current medications as directed. Please refer to the Current Medication list given to you today.  *If you need a refill on your cardiac medications before your next appointment, please call your pharmacy*   Lab Work: FASTING labs - Cmet, TSH, CBC, Lipid, Hgb A1C to be drawn at your convenience.   If you have labs (blood work) drawn today and your tests are completely normal, you will receive your results only by: MyChart Message (if you have MyChart) OR A paper copy in the mail If you have any lab test that is abnormal or we need to change your treatment, we will call you to review the results.   Testing/Procedures: None ordered.    Follow-Up: At [facility], you and your health needs are our priority.  As part of our continuing mission to provide you with exceptional heart care, we have created designated Provider Care Teams.  These Care Teams include your primary Cardiologist (physician) and Advanced Practice Providers (APPs -  Physician Assistants and Nurse Practitioners) who all work together to provide you with the care you need, when you need it.  We recommend signing up for the patient portal called "MyChart".  Sign up information is provided on this After Visit Summary.  MyChart is used to connect with patients for Virtual Visits (Telemedicine).  Patients are able to view lab/test results, encounter notes, upcoming appointments, etc.  Non-urgent messages can be sent to your provider as well.   To learn more about what you can do with MyChart, go to [email or web address].    Your next appointment:   6 month(s)  The format for your next appointment:   In Person  Provider:   Nicki Guadalajara, MD
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
## 2020-06-24 ENCOUNTER — Encounter: Payer: Self-pay | Admitting: Cardiovascular Disease

## 2020-06-28 ENCOUNTER — Ambulatory Visit: Payer: Medicare HMO

## 2020-06-28 ENCOUNTER — Encounter: Payer: Medicare HMO | Admitting: Physical Therapy

## 2020-07-04 ENCOUNTER — Other Ambulatory Visit (HOSPITAL_BASED_OUTPATIENT_CLINIC_OR_DEPARTMENT_OTHER): Payer: Self-pay

## 2020-07-04 ENCOUNTER — Ambulatory Visit: Payer: Medicare HMO

## 2020-07-05 ENCOUNTER — Ambulatory Visit: Payer: Medicare HMO | Attending: Otolaryngology | Admitting: Physical Therapy

## 2020-07-05 ENCOUNTER — Ambulatory Visit: Payer: Medicare HMO | Attending: Internal Medicine

## 2020-07-05 ENCOUNTER — Other Ambulatory Visit: Payer: Self-pay

## 2020-07-05 DIAGNOSIS — R42 Dizziness and giddiness: Secondary | ICD-10-CM | POA: Diagnosis not present

## 2020-07-05 DIAGNOSIS — Z23 Encounter for immunization: Secondary | ICD-10-CM

## 2020-07-05 NOTE — Progress Notes (Signed)
   Covid-19 Vaccination Clinic  Name:  MARSHEA WISHER    MRN: [id number] DOB: 08-03-1932  07/05/2020  Ms. Meacham was observed post Covid-19 immunization for 15 minutes without incident. She was provided with Vaccine Information Sheet and instruction to access the V-Safe system.   Ms. Lye was instructed to call 911 with any severe reactions post vaccine: Difficulty breathing  Swelling of face and throat  A fast heartbeat  A bad rash all over body  Dizziness and weakness   Immunizations Administered     Name Date Dose VIS Date Route   PFIZER Comrnaty(Gray TOP) Covid-19 Vaccine 07/05/2020  1:33 PM 0.3 mL 12/24/2019 Intramuscular   Manufacturer: [employer], [employer]   Lot: [id number]   NDC: [phone]

## 2020-07-05 NOTE — Therapy (Signed)
[facility] MAIN [facility] SERVICES [address] [place], [place], [address] Phone: [phone]   Fax:  [phone]  Physical Therapy Evaluation  Patient Details  Name: Laura Ferguson MRN: [id number] Date of Birth: [birth year] Referring Provider (PT): Dr. Andee Poles   Encounter Date: 07/05/2020   PT End of Session - 07/08/20 1238     Visit Number 1    Number of Visits 9    Date for PT Re-Evaluation 08/30/20    Authorization Type eval 07/05/20    PT Start Time 0953    PT Stop Time 1053    PT Time Calculation (min) 60 min    Equipment Utilized During Treatment Gait belt    Activity Tolerance Patient tolerated treatment well    Behavior During Therapy [facility] for tasks assessed/performed             Past Medical History:  Diagnosis Date   CAD (coronary artery disease)    History of nuclear stress test 11/22/2011   bruce myoview; normal pattern of perfusio; post-stress EF 76%; low risk scan   Hyperlipidemia    Hypertension    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 Bishop Limbo)   CORONARY ARTERY BYPASS GRAFT  05/09/2007   LIMA to LAD, veing to intermediate; SVG to OM1 & OM2; SVG to acute marginal & distal RCA (Dr. Dorris Fetch)   TRANSTHORACIC ECHOCARDIOGRAM  02/13/2010   EF =>55%, vigorous contraction EF 65%; LA mild-mod dilated; IV normal diameter - normal CVP; trace MR; mild TR; trace AV regurg    There were no vitals filed for this visit.     Subjective Assessment - 07/08/20 1223     Subjective Patient states she had some dizziness this morning and has been getting episodes of dizziness that last seconds  nearly every day.    Pertinent History Patient history obtained from medical record and patient report.  Patient known to this Clinical research associate as patient was seen for 3 visits in October 2021 and patient self discharged.  Patient has a history of BPPV that was treated in Dr. Gregary Cromer office.  Patient has been seen by [facility] ENT.  VNG testing revealed abnormal central findings (abnormal smooth pursuit and saccades) as well as a 21% right caloric weakness.  VNG testing also showed a left horizontal beating nystagmus in the body left position and right horizontal nystagmus in the body right position.  Patient reports that she has had some continued dizziness.  Patient reports that her dizziness usually lasts seconds.  Patient reports that holding her head back, head turning and bending over all bring on her symptoms of dizziness.  Patient reports that when she first gets up she sits for a minute before attempting to stand to try to decrease dizziness symptoms. Patient reports that she does not know of anything that improves her dizziness symptoms.  Patient states she is getting almost daily episodes of dizziness and that she experienced some dizziness this morning. Patient describes her dizziness as vertigo, unsteadiness, lightheadedness and aural fullness.  Patient reports that she has not been having vertigo recently, but states if  she tips her head back it will bring on the vertigo sometimes.  Patient's symptoms are motion provoked and intermittent.  Patient reports that she veers when walking at times.  Patient denies falls but reports that she is very cautious with her movements.  Patient reports that she has a lot of energy, is very active, exercises every morning and she does all of her own housework.  Patient reports that she drives.  Patient reports that she returns to Dr. Gregary Cromer office in 3 months for follow-up visit.  Patient has diabetes mellitus and she reports diabetic retinopathy with impaired vision  requiring bifocals.    Limitations Walking    Diagnostic tests Brain MRI with IAC performed 08/05/19 and revealed mild cerebral atrophy, mild chronic microvascular ischemic changes and remote right occipital insult but no acute findings.  VNG testing revealed abnormal central findings of abnormal smooth pursuit and saccades as well as a 21% right caloric weakness.    Patient Stated Goals Decrease dizziness    Currently in Pain? Other (Comment)   None stated               [facility] PT Assessment - 07/08/20 1138       Assessment   Medical Diagnosis Dizziness and giddiness    Referring Provider (PT) Dr. Andee Poles    Next MD Visit 3 months    Prior Therapy vestibular therapy Oct 2021 only seen for 3 visits and pt self discharged      Precautions   Precautions Fall      Restrictions   Weight Bearing Restrictions No      Balance Screen   Has the patient fallen in the past 6 months No    Has the patient had a decrease in activity level because of a fear of falling?  No    Is the patient reluctant to leave their home because of a fear of falling?  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  Patient known to this Clinical research associate as patient was seen for 3 visits in October 2021 and patient self discharged.  Patient has a history of BPPV that was treated in Dr. Gregary Cromer office.  Patient has been seen by [facility].  VNG testing revealed abnormal central findings (abnormal smooth pursuit and saccades) as  well as a 21% right caloric weakness.  VNG testing also showed a left horizontal beating nystagmus in the body left position and right horizontal nystagmus in the body right position.  Patient reports that she has had some continued dizziness.  Patient reports that her dizziness usually lasts seconds.  Patient reports that holding her head back, head turning and bending over all bring on her symptoms of dizziness.  Patient reports that when she first gets up she sits for a minute before attempting to stand to try to decrease dizziness symptoms. Patient reports that she does not know of anything that improves her dizziness symptoms.  Patient states she is getting almost daily episodes of dizziness and that she experienced some dizziness this morning. Patient describes her dizziness as vertigo, unsteadiness, lightheadedness and aural fullness.  Patient reports that she has not been having vertigo recently, but states if she tips her head back it will bring on the vertigo sometimes.  Patient's symptoms are motion provoked and intermittent.  Patient reports that she veers when walking at times.  Patient denies falls but reports that she is very cautious with her movements.  Patient reports that she has a lot of energy, is very active, exercises every morning and she does all of her own housework.  Patient reports that she drives.  Patient reports that she returns to Dr. Gregary CromerVaught's office in 3 months for follow-up visit.  Patient  has diabetes mellitus and she reports diabetic retinopathy with impaired vision requiring bifocals.  Patient states she lives alone. 1 story with steps to enter home. She states she walks up/down stairs without rail.   Description of dizziness: vertigo, unsteadiness, light-headedness, aural fullness Symptom nature: (motion provoked, positional, spontaneous, constant, variable, intermittent)   Progression of symptoms: better History of similar episodes: yes  Falls (yes/no): no Number of falls in past 6 months: 0  Auditory complaints (tinnitus, pain, drainage): tinnitus in left ear chronic Vision (last eye exam, diplopia, recent changes): getting injections for her eyes; double vision but not since eye injections  Current Symptoms: (dysarthria, dysphagia, drop attacks, bowel and bladder changes, recent weight loss/gain)    Review of systems negative for red flags, except patient states she gets mucus come up in her throat mostly at night which can make it more difficult to swallow at times.     EXAMINATION  MUSCULOSKELETAL SCREEN: Cervical Spine ROM: AROM cervical spine flexion, extension, right and left rotation WFL  Gait: Patient arrives ambulating without AD. Patient ambulates with  Scanning of visual environment with gait is:  Right leg limping without pain states her right leg drags if she tries to walk slowly  Balance: Patient is challenged by uneven surfaces, narrow stance, single-leg stance and ambulation with head and body turns.  POSTURAL CONTROL TESTS:  Clinical Test of Sensory Interaction for Balance (CTSIB): CONDITION TIME STRATEGY SWAY  Eyes open, firm surface 30 seconds ankle   Eyes closed, firm surface 30 seconds ankle   Eyes open, foam surface 30 seconds ankle   Eyes closed, foam surface 30 seconds ankle     OCULOMOTOR / VESTIBULAR TESTING: Oculomotor Exam- Room Light  Normal Abnormal Comments  Ocular Alignment N    Ocular ROM N    Spontaneous Nystagmus N     Gaze evoked Nystagmus N    Smooth Pursuit  Abn Saccadic corrections noted  Saccades  Abn Mild dizziness  VOR  Abn   VOR Cancellation  Abn dizziness  Left Head Impulse  Right Head Impulse  Abn Dizziness with head turning   BPPV TESTS:  Symptoms Duration Intensity Nystagmus  Left Dix-Hallpike None N/A N/A No nystagmus observed  Right Dix-Hallpike None N/A N/A No nystagmus observed   FUNCTIONAL OUTCOME MEASURES:  Results Comments  DHI 28/100 Low perception of handicap  ABC Scale 73.7% Falls risk; in need of intervention  DGI 15/24 Falls risk; in need of intervention  FOTO deferred Given the patient's risk adjustment variables, like-patients nationally had a FS score of  /100 at intake    VOR X 1 exercise:  Demonstrated and educated as to VOR X1.  Patient performed VOR X 1 horizontal in sitting 3 reps of 30-40 seconds each with verbal cues for technique.  Patient reports 7/10 dizziness.  Issued for HEP.         PT Short Term Goals - 07/08/20 1230       PT SHORT TERM GOAL #1   Title Pt will be independent with HEP in order to improve balance and decrease dizziness symptoms in order to decrease fall risk and improve function at home.    Time 4    Period Weeks    Status New    Target Date 08/01/20               PT Long Term Goals - 07/08/20 1232       PT LONG TERM GOAL #1   Title Patient will demonstrate reduced falls risk as evidenced by Dynamic Gait Index (DGI) 19/24 or greater.    Baseline scored 15/24 on 07/05/20;    Time 8    Period Weeks    Status New    Target Date 08/30/20      PT LONG TERM GOAL #2   Title PT will improve FOTO score by 5 points or greater in order to demonstrate significant improvement in her function.    Time 8    Period Weeks    Status New    Target Date 08/30/20      PT LONG TERM GOAL #3   Title Patient will report 50% or greater improvement in her symptoms of dizziness and imbalance with provoking motions or positions.     Time 8    Period Weeks    Status New    Target Date 08/30/20               Plan - 07/08/20 1238     Clinical Impression Statement Patient presents with concerns of daily episodes of dizziness that lasts seconds.  Per medical record patient had VNG testing that revealed 21% right caloric weakness and abnormal central findings with abnormal smooth pursuit and saccades.  In addition, patient has a history of BPPV.  Patient with negative Dix-Hallpike testing this date.  Oculomotor testing this date revealed abnormal smooth pursuits, saccades, VOR, VOR cancellation and right head impulse test.  Patient scored 15/24 on the DGI and 73.7% on the ABC scale indicating falls risk.  Patient would benefit from skilled vestibular PT services to try to address functional deficits and goals as set on plan of care.    Personal Factors and Comorbidities Age;Comorbidity 3+    Comorbidities DM, CAD, HTN, GAD    Examination-Activity Limitations [place];Other   head turn and body turns   Examination-Participation Restrictions --    Stability/Clinical Decision Making Evolving/Moderate complexity    Clinical Decision Making Moderate    Rehab Potential Fair    PT Frequency 1x / week    PT  Duration 8 weeks    PT Treatment/Interventions ADLs/Self Care Home Management;Canalith Repostioning;Gait training;Stair training;Functional mobility training;Therapeutic activities;Therapeutic exercise;Balance training;Neuromuscular re-education;Patient/family education;Vestibular    PT Next Visit Plan review VOR X 1, work on ambulation with head turns and turning, work on feet together and semi-tandem stance with head turns.    PT Home Exercise Plan VOR X 1 in standing 30 second reps plain background    Consulted and Agree with Plan of Care Patient                    Patient will benefit from skilled therapeutic intervention in order to improve the following deficits and impairments:     Visit Diagnosis: Dizziness  and giddiness     Problem List Patient Active Problem List   Diagnosis Date Noted   Fatigue 03/02/2020   Preventative health care 10/13/2019   Other social stressor 08/31/2019   Acute left ankle pain 03/09/2019   Acute pain of left foot 03/09/2019   Diabetes mellitus (HCC) 01/29/2018   Chronic back pain 11/12/2017   GAD (generalized anxiety disorder) 10/16/2017   Ankle swelling 06/01/2015   Dizziness 03/26/2015   CAD (coronary artery disease) 12/29/2012   HTN (hypertension) 12/29/2012   Hyperlipidemia with target LDL less than 70 12/29/2012   Mardelle Matte PT, DPT #[id number] Mardelle Matte 07/05/2020, 9:59 AM  [facility] MAIN [facility] SERVICES [address] [place], [place], [address] Phone: [phone]   Fax:  [phone]  Name: Laura Ferguson MRN: [id number] Date of Birth: [birth year]

## 2020-07-14 DIAGNOSIS — I10 Essential (primary) hypertension: Secondary | ICD-10-CM | POA: Diagnosis not present

## 2020-07-14 DIAGNOSIS — E785 Hyperlipidemia, unspecified: Secondary | ICD-10-CM | POA: Diagnosis not present

## 2020-07-14 DIAGNOSIS — Z951 Presence of aortocoronary bypass graft: Secondary | ICD-10-CM | POA: Diagnosis not present

## 2020-07-14 DIAGNOSIS — E1159 Type 2 diabetes mellitus with other circulatory complications: Secondary | ICD-10-CM | POA: Diagnosis not present

## 2020-07-14 DIAGNOSIS — Z794 Long term (current) use of insulin: Secondary | ICD-10-CM | POA: Diagnosis not present

## 2020-07-14 LAB — COMPREHENSIVE METABOLIC PANEL
ALT: 21 IU/L (ref 0–32)
AST: 22 IU/L (ref 0–40)
Albumin/Globulin Ratio: 1.6 (ref 1.2–2.2)
Albumin: 4.6 g/dL (ref 3.6–4.6)
Alkaline Phosphatase: 91 IU/L (ref 44–121)
BUN/Creatinine Ratio: 20 (ref 12–28)
BUN: 25 mg/dL (ref 8–27)
Bilirubin Total: 0.5 mg/dL (ref 0.0–1.2)
CO2: 21 mmol/L (ref 20–29)
Calcium: 9.9 mg/dL (ref 8.7–10.3)
Chloride: 106 mmol/L (ref 96–106)
Creatinine, Ser: 1.27 mg/dL — ABNORMAL HIGH (ref 0.57–1.00)
Globulin, Total: 2.8 g/dL (ref 1.5–4.5)
Glucose: 170 mg/dL — ABNORMAL HIGH (ref 65–99)
Potassium: 4.6 mmol/L (ref 3.5–5.2)
Sodium: 140 mmol/L (ref 134–144)
Total Protein: 7.4 g/dL (ref 6.0–8.5)
eGFR: 41 mL/min/{1.73_m2} — ABNORMAL LOW (ref >59)

## 2020-07-14 LAB — CBC
Hematocrit: 38 % (ref 34.0–46.6)
Hemoglobin: 12.8 g/dL (ref 11.1–15.9)
MCH: 30.4 pg (ref 26.6–33.0)
MCHC: 33.7 g/dL (ref 31.5–35.7)
MCV: 90 fL (ref 79–97)
Platelets: 180 10*3/uL (ref 150–450)
RBC: 4.21 x10E6/uL (ref 3.77–5.28)
RDW: 13.8 % (ref 11.7–15.4)
WBC: 5.5 10*3/uL (ref 3.4–10.8)

## 2020-07-14 LAB — HEMOGLOBIN A1C
Est. average glucose Bld gHb Est-mCnc: 192 mg/dL
Hgb A1c MFr Bld: 8.3 % — ABNORMAL HIGH (ref 4.8–5.6)

## 2020-07-14 LAB — LIPID PANEL
Chol/HDL Ratio: 2 ratio (ref 0.0–4.4)
Cholesterol, Total: 139 mg/dL (ref 100–199)
HDL: 70 mg/dL (ref >39)
LDL Chol Calc (NIH): 53 mg/dL (ref 0–99)
Triglycerides: 84 mg/dL (ref 0–149)
VLDL Cholesterol Cal: 16 mg/dL (ref 5–40)

## 2020-07-14 LAB — TSH: TSH: 0.565 u[IU]/mL (ref 0.450–4.500)

## 2020-07-19 ENCOUNTER — Ambulatory Visit: Payer: Medicare HMO | Attending: Otolaryngology | Admitting: Physical Therapy

## 2020-07-26 ENCOUNTER — Ambulatory Visit: Payer: Medicare HMO | Admitting: Physical Therapy

## 2020-08-02 ENCOUNTER — Encounter: Payer: Medicare HMO | Admitting: Physical Therapy

## 2020-08-02 DIAGNOSIS — H35373 Puckering of macula, bilateral: Secondary | ICD-10-CM | POA: Diagnosis not present

## 2020-08-02 DIAGNOSIS — E113313 Type 2 diabetes mellitus with moderate nonproliferative diabetic retinopathy with macular edema, bilateral: Secondary | ICD-10-CM | POA: Diagnosis not present

## 2020-08-02 DIAGNOSIS — H401130 Primary open-angle glaucoma, bilateral, stage unspecified: Secondary | ICD-10-CM | POA: Diagnosis not present

## 2020-08-02 DIAGNOSIS — H35033 Hypertensive retinopathy, bilateral: Secondary | ICD-10-CM | POA: Diagnosis not present

## 2020-08-04 ENCOUNTER — Other Ambulatory Visit: Payer: Self-pay

## 2020-08-04 ENCOUNTER — Ambulatory Visit (INDEPENDENT_AMBULATORY_CARE_PROVIDER_SITE_OTHER): Payer: Medicare HMO | Admitting: Primary Care

## 2020-08-04 ENCOUNTER — Encounter: Payer: Self-pay | Admitting: Primary Care

## 2020-08-04 DIAGNOSIS — M791 Myalgia, unspecified site: Secondary | ICD-10-CM

## 2020-08-04 NOTE — Patient Instructions (Signed)
Stop taking your rosuvastatin (Crestor) cholesterol medication for 2 weeks. Pay attention to your cramping as discussed.  Call me in two weeks with an update in your cramping OR call me sooner if the symptoms happen again.  It was a pleasure to see you today!

## 2020-08-04 NOTE — Progress Notes (Signed)
Subjective:    Patient ID: Laura Ferguson, female    DOB: 24-Apr-1932, 87 y.o.   MRN: [id number]  HPI  Laura Ferguson is a very pleasant 87 y.o. female with a history of type 2 diabetes, chronic back pain, mild arthritis of the right hip, moderate lumbar degenerative disc disease who presents today to discuss her recent blood work and left wrist and hand pain.   About one week ago she woke up and noticed that her left hand "was drawn in", could straighten her fingers. Eventually she was able to gradually open and grasp objects. The use of her left hand, wrist, and upper extremity returned to normal by the end of that same day.  She has noticed intermittent cramping for months to the left upper and lower extremity. She is managed on rosuvastatin 10 mg.   She denies unilateral weakness, slurred speech, dizziness, visual changes.   BP Readings from Last 3 Encounters:  08/04/20 (!) 154/68  06/23/20 (!) 157/70  06/08/20 130/76     Review of Systems  Respiratory:  Negative for shortness of breath.   Cardiovascular:  Negative for chest pain.  Musculoskeletal:  Positive for myalgias.  Neurological:  Negative for dizziness, speech difficulty, weakness, numbness and headaches.        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  Substance and Sexual Activity   Alcohol use: No    Alcohol/week: 0.0 standard drinks   Drug use: No   Sexual activity: Not on file  Other Topics Concern   Not on file  Social History Narrative   Married.   Retired.   Works as a Engineer, structural.    Enjoys helping other.    Social Determinants of Health   Financial Resource Strain: Not on file  Food Insecurity: Not on file  Transportation Needs: Not on file  Physical Activity: Not on file  Stress: Not on file  Social Connections: Not on file  Intimate Partner Violence: Not on file    Past Surgical History:  Procedure Laterality Date   CARDIAC CATHETERIZATION  05/07/2007   EF 55%, focal mild hypocontractility in mid-distal inferior wall & mid posterolateral wall; severe multivessel CAD - susequent CABGx6 (Dr. Bishop Limbo)   CORONARY ARTERY BYPASS GRAFT  05/09/2007   LIMA to LAD, veing to intermediate; SVG to OM1 & OM2; SVG to acute marginal & distal RCA (Dr. Dorris Fetch)   TRANSTHORACIC ECHOCARDIOGRAM  02/13/2010   EF =>55%, vigorous contraction EF 65%; LA mild-mod dilated; IV normal diameter - normal CVP; trace MR; mild TR; trace AV regurg    Family History  Problem  Relation Age of Onset   Cancer Mother    Diabetes Sister    Cancer Child     No Known Allergies  Current Outpatient Medications on File Prior to Visit  Medication Sig Dispense Refill   Acetaminophen (TYLENOL 8 HOUR PO) Take 1 tablet by mouth every 8 (eight) hours as needed.      aspirin 81 MG tablet Take 81 mg by mouth daily.     Azelastine HCl 137 MCG/SPRAY SOLN As needed     BD PEN NEEDLE NANO 2ND GEN 32G X 4 MM MISC USE WITH INSULIN PEN 4 TIMES DAILY. 100 each 5   glucose blood (ACCU-CHEK GUIDE) test strip Use as instructed to check sugar three times daily 300 each 12   LEVEMIR FLEXTOUCH 100 UNIT/ML FlexPen INJECT 30 UNITS SUBCUTANEOUSLY IN THE MORNING AND 10 AT BEDTIME 30 mL 2   losartan (COZAAR) 100 MG tablet Take 1 tablet by mouth  once daily 90 tablet 2   metoprolol succinate (TOPROL-XL) 50 MG 24 hr tablet TAKE ONE TABLET BY MOUTH IN THE MORNING AND TAKE ONE-HALF TABLET BY MOUTH IN THE EVENING (NEED TO SCHEDULE AN APPOINTMENT) 135 tablet 1   Multiple Vitamin (MULTIVITAMIN) capsule Take 1 capsule by mouth daily.     NOVOLOG FLEXPEN 100 UNIT/ML FlexPen INJECT 6 TO 10 UNITS SUBCUTANEOUSLY THREE TIMES DAILY WITH MEALS 30 mL 0   pantoprazole (PROTONIX) 40 MG tablet Take 1 tablet by mouth once daily 90 tablet 3   Polyethyl Glycol-Propyl Glycol (SYSTANE OP) Apply 2 drops to eye 2 (two) times daily.      prednisoLONE acetate (PRED FORTE) 1 % ophthalmic suspension      rosuvastatin (CRESTOR) 10 MG tablet Take 1 tablet by mouth once daily 90 tablet 3   TRAVATAN Z 0.004 % SOLN ophthalmic solution Place 1 drop into both eyes at bedtime.     No current facility-administered medications on file prior to visit.    BP (!) 154/68   Pulse 82   Temp 97.7 F (36.5 C) (Temporal)   Ht 5\' 3"  (1.6 m)   Wt 138 lb (62.6 kg)   SpO2 97%   BMI 24.45 kg/m  Objective:   Physical Exam Eyes:     Extraocular Movements: Extraocular movements intact.  Musculoskeletal:     Right wrist: No swelling or deformity. Normal range of motion.     Left wrist: No swelling or deformity. Normal range of motion.     Right hand: No swelling or deformity. Normal range of motion. Normal strength.     Left hand: No swelling or deformity. Normal range of motion. Normal strength.  Skin:    General: Skin is warm and dry.  Neurological:     Mental Status: She is oriented to person, place, and time.     Cranial Nerves: No cranial nerve deficit.     Motor: No weakness.     Coordination: Coordination normal.     Gait: Gait normal.     Deep Tendon Reflexes: Reflexes normal.     Comments: Grips equal bilaterally, upper extremities equal with 5/5 strength.  No facial dropping or slurred speech noted.           Assessment & Plan:      This visit occurred  during the SARS-CoV-2 public health emergency.  Safety protocols were in place, including screening questions prior to the visit, additional usage of staff PPE, and extensive cleaning of exam room while observing appropriate contact time as indicated  for disinfecting solutions.

## 2020-08-04 NOTE — Assessment & Plan Note (Signed)
Acutely to the left hand with cramping and decrease in ROM. Resolved the same day.   She had no other symptoms that day that would suggest a stroke. Her exam today was unremarkable.  She is managed on rosuvastatin so this could be contributing, especially after she mentioned intermittent cramping to her lower extremity.  Recent labs reviewed from cardiology. Will have her hold statin for 2 weeks and update.  If symptoms return then proceed with CT head. She agrees.

## 2020-08-09 ENCOUNTER — Encounter: Payer: Medicare HMO | Admitting: Physical Therapy

## 2020-08-16 ENCOUNTER — Encounter: Payer: Medicare HMO | Admitting: Physical Therapy

## 2020-08-16 DIAGNOSIS — R42 Dizziness and giddiness: Secondary | ICD-10-CM | POA: Diagnosis not present

## 2020-08-16 DIAGNOSIS — H903 Sensorineural hearing loss, bilateral: Secondary | ICD-10-CM | POA: Diagnosis not present

## 2020-08-23 ENCOUNTER — Encounter: Payer: Medicare HMO | Admitting: Physical Therapy

## 2020-08-28 ENCOUNTER — Other Ambulatory Visit: Payer: Self-pay | Admitting: Endocrinology

## 2020-08-30 ENCOUNTER — Encounter: Payer: Medicare HMO | Admitting: Physical Therapy

## 2020-08-30 DIAGNOSIS — H35373 Puckering of macula, bilateral: Secondary | ICD-10-CM | POA: Diagnosis not present

## 2020-08-30 DIAGNOSIS — E113313 Type 2 diabetes mellitus with moderate nonproliferative diabetic retinopathy with macular edema, bilateral: Secondary | ICD-10-CM | POA: Diagnosis not present

## 2020-09-06 ENCOUNTER — Encounter: Payer: Medicare HMO | Admitting: Physical Therapy

## 2020-09-13 ENCOUNTER — Encounter: Payer: Medicare HMO | Admitting: Physical Therapy

## 2020-09-14 DIAGNOSIS — E113313 Type 2 diabetes mellitus with moderate nonproliferative diabetic retinopathy with macular edema, bilateral: Secondary | ICD-10-CM | POA: Diagnosis not present

## 2020-09-14 LAB — HM DIABETES EYE EXAM

## 2020-09-16 ENCOUNTER — Encounter: Payer: Self-pay | Admitting: Internal Medicine

## 2020-09-22 ENCOUNTER — Other Ambulatory Visit: Payer: Self-pay

## 2020-09-22 ENCOUNTER — Ambulatory Visit (INDEPENDENT_AMBULATORY_CARE_PROVIDER_SITE_OTHER): Payer: Medicare HMO | Admitting: Internal Medicine

## 2020-09-22 ENCOUNTER — Encounter: Payer: Self-pay | Admitting: Internal Medicine

## 2020-09-22 VITALS — BP 128/80 | HR 77 | Ht 63.0 in | Wt 135.6 lb

## 2020-09-22 DIAGNOSIS — E119 Type 2 diabetes mellitus without complications: Secondary | ICD-10-CM

## 2020-09-22 DIAGNOSIS — Z794 Long term (current) use of insulin: Secondary | ICD-10-CM

## 2020-09-22 DIAGNOSIS — E1159 Type 2 diabetes mellitus with other circulatory complications: Secondary | ICD-10-CM | POA: Diagnosis not present

## 2020-09-22 DIAGNOSIS — E785 Hyperlipidemia, unspecified: Secondary | ICD-10-CM | POA: Diagnosis not present

## 2020-09-22 LAB — POCT GLYCOSYLATED HEMOGLOBIN (HGB A1C): Hemoglobin A1C: 7.8 % — AB (ref 4.0–5.6)

## 2020-09-22 MED ORDER — NOVOLOG FLEXPEN 100 UNIT/ML ~~LOC~~ SOPN: PEN_INJECTOR | SUBCUTANEOUS | 3 refills | Status: DC

## 2020-09-22 MED ORDER — LEVEMIR FLEXTOUCH 100 UNIT/ML ~~LOC~~ SOPN: PEN_INJECTOR | SUBCUTANEOUS | 3 refills | Status: DC

## 2020-09-22 MED ORDER — BD PEN NEEDLE NANO 2ND GEN 32G X 4 MM MISC: 3 refills | Status: DC

## 2020-09-22 NOTE — Patient Instructions (Addendum)
Please change: - Levemir 24 units in a.m. and 10 units at bedtime (EVERY NIGHT) - Novolog 6-12 units 15 min before meals  DO NOT CORRECT the blood sugars after meals.  Please return in 4 months with your sugar log.

## 2020-09-22 NOTE — Progress Notes (Signed)
atient ID: Laura Ferguson, female   DOB: [birth year], 87 y.o.   MRN: [id number]   This visit occurred during the SARS-CoV-2 public health emergency.  Safety protocols were in place, including screening questions prior to the visit, additional usage of staff PPE, and extensive cleaning of exam room while observing appropriate contact time as indicated for disinfecting solutions.   HPI: Laura Ferguson is a 87 y.o.-year-old female, returning for f/u for DM2, dx in 1990s, insulin-dependent for years, uncontrolled, with complications (CAD - s/p CABG x6, CKD, DR, PN). Last visit 4 months ago.  Interim hx: No increased urination, nausea, diarrhea, chest pain. She had blurry vision - gets IO inj's.  Reviewed HbA1c levels: Lab Results  Component Value Date   HGBA1C 8.3 (H) 07/14/2020   HGBA1C 7.9 (A) 05/19/2020   HGBA1C 8.4 (A) 01/20/2020   HGBA1C 8.1 (A) 09/23/2019   HGBA1C 8.5 (A) 06/18/2019   HGBA1C 7.9 (A) 02/17/2019   HGBA1C 8.4 (A) 10/16/2018   HGBA1C 8.8 (A) 06/16/2018   HGBA1C 8.9 (A) 01/29/2018   HGBA1C 9.4 (A) 10/15/2017   HGBA1C 8.5 04/18/2017   HGBA1C 9.1 01/17/2017   HGBA1C 8.8 10/16/2016   HGBA1C 11.7 (H) 04/02/2016   HGBA1C 10.0 (H) 01/03/2016   HGBA1C 9.5 (H) 09/30/2015   HGBA1C 16.2 (H) 06/27/2015   HGBA1C 14.3 (H) 03/21/2015   HGBA1C (H) 05/08/2007    9.6 (NOTE)   The ADA recommends the following therapeutic goals for glycemic   control related to Hgb A1C measurement:   Goal of Therapy:   < 7.0% Hgb A1C   Action Suggested:  > 8.0% Hgb A1C   Ref:  Diabetes Care, 22, Suppl. 1, 1999   HGBA1C (H) 05/07/2007    9.8 (NOTE)   The ADA recommends the following therapeutic goals for glycemic   control related to Hgb A1C measurement:   Goal of Therapy:   < 7.0% Hgb A1C   Action Suggested:  > 8.0% Hgb A1C   Ref:  Diabetes Care, 22, Suppl. 1, 1999   She is usually not following the recommended regimen despite repeated advice at every visit: In the past, she was on: - Levemir 30 units in  a.m. and 15 units units at bedtime (but skips it if sugars are at goal!!! - takes it maybe 2x a week) - Novolog 12-15 units 15 min before meals (but actually takes 12 units in am and 10 units before lunch and dinner)  Then on: - Levemir 30 units in a.m. and only occasionally, 12 units units at bedtime - Novolog 12 units 15 min before meals  At last visit: - Levemir 30 units in a.m. >> not taking - Novolog 12-15 units 15 min before meals - only using 6-12 units and misses doses  At this visit: - Levemir 30 units in a.m. and 10 units at bedtime >> taking 0-10 units - Novolog 12-15 units 15 min before meals >> cannot tell exactly how much she takes but apparently close to: 12 units in am  0 units before lunch 0-12 units before dinner   She checks sugars 3 times a day per her meter download: - am:  189-274 >> 195-358 >> 135, 162-257, 340 >> 123-316 >> 67, 146-243 - 2h after b'fast: 64, 83-189, 207, 253 >> n/c >> 59, 88, 200, 266 >> n/c >> 71, 96-143, 326 - before lunch:  184, 200-324, 387 >> 170-290 >> 271, 299 >> 85-343 >> 64-182, 260 - 2h after lunch: 185 >>  277-304, 378 >> 93-176 >> ? >> 356 >> 194-283 >> 81-252 - before dinner: 128-386 >> 93-247 >> 95, 136-267, 345 >> 107-349 >> 98-229 - 2h after dinner: 86, 122-311 >> 105, 235 >> 75, 142-321 >> 60-228 >> 13-161 - bedtime: 217, 209 >> n/c >> 80-178  >> n/c - nighttime: n/c >> 136-157 >> 118-176 >> 135-268, 312 >> 176, 215 >> 186 Lowest sugar was 59 >> 59 >> 75 >> 60 >. 64; it is unclear at which level she has hypoglycemia awareness Highest sugar was 451 ... >> 358 >> 340 >> 349 >> 326.  Glucometer: AccuChek  Pt's meals are: - Breakfast: oatmeal, grits, apple sauce - Lunch: tuna sandwich, bologna - Dinner: soup, sandwich  -+ CKD, last BUN/creatinine:  Lab Results  Component Value Date   BUN 25 07/14/2020   BUN 38 (H) 03/02/2020   CREATININE 1.27 (H) 07/14/2020   CREATININE 1.47 (H) 03/02/2020  On losartan 75.  -+ HL; last  set of lipids: Lab Results  Component Value Date   CHOL 139 07/14/2020   HDL 70 07/14/2020   LDLCALC 53 07/14/2020   TRIG 84 07/14/2020   CHOLHDL 2.0 07/14/2020  On Crestor 10.  Cardiologist: Dr. Tresa EndoKelly.   - last eye exam was in 08/2020: + DR, she gets intraocular injections. Dr. Tomie ChinaZane.  - she has numbness and tingling in her feet.  She also has HTN.  Both of her sons died in the 2 weeks in 2021: one of Covid, one unexpectedly!   ROS: Constitutional: no weight gain/no weight loss, no fatigue, no subjective hyperthermia, no subjective hypothermia Eyes: + blurry vision, no xerophthalmia ENT: no sore throat, no nodules palpated in neck, no dysphagia, no odynophagia, no hoarseness Cardiovascular: no CP/no SOB/no palpitations/no leg swelling Respiratory: no cough/no SOB/no wheezing Gastrointestinal: no N/no V/no D/no C/no acid reflux Musculoskeletal: no muscle aches/no joint aches Skin: no rashes, no hair loss Neurological: no tremors/+ numbness/+ tingling/no dizziness  I reviewed pt's medications, allergies, PMH, social hx, family hx, and changes were documented in the history of present illness. Otherwise, unchanged from my initial visit note.  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 Bishop Limbo. Kelly)   CORONARY ARTERY BYPASS GRAFT  05/09/2007   LIMA to LAD, veing to intermediate; SVG to OM1 & OM2; SVG to acute marginal &  distal RCA (Dr. Dorris FetchHendrickson)   TRANSTHORACIC ECHOCARDIOGRAM  02/13/2010   EF =>55%, vigorous contraction EF 65%; LA mild-mod dilated; IV normal diameter - normal CVP; trace MR; mild TR; trace AV regurg   Social History   Social History   Marital status: Widowed    Spouse name: N/A   Number of children: 8   Occupational History   homemaker   Social History Main Topics   Smoking status: Never Smoker   Smokeless tobacco: Never Used     Comment:  quit 1960's  smoked very lightly.   Alcohol use No   Drug use: No     Social History Narrative   Married.   Retired.   Works as a Engineer, structural.    Enjoys helping other.    Current Outpatient Medications on File Prior to Visit  Medication Sig Dispense Refill   Acetaminophen (TYLENOL 8 HOUR PO) Take 1 tablet by mouth every 8 (eight) hours as needed.      aspirin 81 MG tablet Take 81 mg by mouth daily.     Azelastine HCl 137 MCG/SPRAY SOLN As needed     BD PEN NEEDLE NANO 2ND GEN 32G X 4 MM MISC USE WITH INSULIN PEN 4 TIMES DAILY. 100 each 5   glucose blood (ACCU-CHEK GUIDE) test strip Use as instructed to check sugar three times daily 300 each 12   LEVEMIR FLEXTOUCH 100 UNIT/ML FlexPen INJECT 30 UNITS SUBCUTANEOUSLY IN THE MORNING AND 10 AT BEDTIME 30 mL 2   losartan (COZAAR) 100 MG tablet Take 1 tablet by mouth once daily 90 tablet 2   metoprolol succinate (TOPROL-XL) 50 MG 24 hr tablet TAKE ONE TABLET BY MOUTH IN THE MORNING AND TAKE ONE-HALF TABLET BY MOUTH IN THE EVENING (NEED TO SCHEDULE AN APPOINTMENT) 135 tablet 1   Multiple Vitamin (MULTIVITAMIN) capsule Take 1 capsule by mouth daily.     NOVOLOG FLEXPEN 100 UNIT/ML FlexPen INJECT 6 TO 10 UNITS SUBCUTANEOUSLY THREE TIMES DAILY WITH MEALS 30 mL 0   pantoprazole (PROTONIX) 40 MG tablet Take 1 tablet by mouth once daily 90 tablet 3   Polyethyl Glycol-Propyl Glycol (SYSTANE OP) Apply 2 drops to eye 2 (two) times daily.      prednisoLONE acetate (PRED FORTE) 1 % ophthalmic suspension       rosuvastatin (CRESTOR) 10 MG tablet Take 1 tablet by mouth once daily 90 tablet 3   TRAVATAN Z 0.004 % SOLN ophthalmic solution Place 1 drop into both eyes at bedtime.     No current facility-administered medications on file prior to visit.   No Known Allergies   Family History  Problem Relation Age of Onset   Cancer Mother    Diabetes Sister    Cancer Child    Pt has FH of DM in [place], sister - died from DM complications (refused to start HD), sons - 1 son died from DM complications.  Her youngest son died February 10, 2017 (cancer). He was 53.  PE: There were no vitals taken for this visit. Wt Readings from Last 3 Encounters:  08/04/20 138 lb (62.6 kg)  06/23/20 139 lb 6.4 oz (63.2 kg)  06/08/20 136 lb (61.7 kg)   Constitutional: normal weight, in NAD Eyes: PERRLA, EOMI, no exophthalmos ENT: moist mucous membranes, no thyromegaly, no cervical lymphadenopathy Cardiovascular: RRR, No MRG Respiratory: CTA B Gastrointestinal: abdomen soft, NT, ND, BS+ Musculoskeletal: no deformities, strength intact in all 4 Skin: moist, warm, no rashes Neurological: no tremor with outstretched hands, DTR normal in all 4  ASSESSMENT: 1. DM2, insulin-dependent, uncontrolled, with complications - CAD - s/p CABG x6 - CKD - DR - PN  2. HL  3. PN  PLAN:  1. Patient with longstanding, uncontrolled, type 2 diabetes, on basal-bolus insulin regimen, with previous deterioration of her diabetes control after her sons' deaths.  However, the major hurdle in her diabetes care is that she is not usually following the recommended regimen, missing Levemir doses at night, also skipping NovoLog or taking different doses than suggested.  At every  visit, I explained the rationale for all of her insulin doses but she did not appear to understand why it was mandatory to take Levemir regularly.  I also advised her at that time that it was important to take NovoLog before every meal and not to split doses.  Since we went  through this at every visit and she was still not able to follow the recommended regimen which put her in danger for hyperand hypoglycemia, I advised her that if she did not start to take the suggested regimen, I  would not be able to help her and she should not schedule another appointment. -HbA1c at last visit was slightly better at 7.9%, however, since then, she had another HbA1c which was higher, at 8.3%. -At this visit, she is again varying the dose of Levemir at night based on the blood sugars.  I again advised her not to do so and we will try to decrease the dose of Levemir in the morning to allow her to always take the 10 units at night.  This is needed because her blood sugars in the morning are usually high.  Regarding the NovoLog dose, despite repeated advised in the past and again today, she is still not taking the mealtime insulin if her sugars are at goal before meals.  I again advised her that she needs to take NovoLog if she is planning to eat evening her blood sugars before meals are at goal, but, since she has been using this incorrectly over the years, I am not sure whether she will be able to change this behavior... - I suggested to:  Patient Instructions  Please change: - Levemir 24 units in a.m. and 10 units at bedtime (EVERY NIGHT) - Novolog 6-12 units 15 min before meals  DO NOT CORRECT the blood sugars after meals.  Please return in 4 months with your sugar log.  - we checked her HbA1c: 7.8% (better) - advised to check sugars at different times of the day - 3x a day, rotating check times - advised for yearly eye exams >> she is UTD - return to clinic in 4 months   2. HL -Reviewed latest lipid panel from 06/2020: All fractions at goal: Lab Results  Component Value Date   CHOL 139 07/14/2020   HDL 70 07/14/2020   LDLCALC 53 07/14/2020   TRIG 84 07/14/2020   CHOLHDL 2.0 07/14/2020  -Continues Crestor 10 mg daily without side effects   Carlus Pavlov, MD  PhD [facility]

## 2020-09-26 ENCOUNTER — Encounter: Payer: Self-pay | Admitting: Primary Care

## 2020-09-27 DIAGNOSIS — H35373 Puckering of macula, bilateral: Secondary | ICD-10-CM | POA: Diagnosis not present

## 2020-09-27 DIAGNOSIS — E113313 Type 2 diabetes mellitus with moderate nonproliferative diabetic retinopathy with macular edema, bilateral: Secondary | ICD-10-CM | POA: Diagnosis not present

## 2020-11-01 DIAGNOSIS — H35033 Hypertensive retinopathy, bilateral: Secondary | ICD-10-CM | POA: Diagnosis not present

## 2020-11-01 DIAGNOSIS — E113313 Type 2 diabetes mellitus with moderate nonproliferative diabetic retinopathy with macular edema, bilateral: Secondary | ICD-10-CM | POA: Diagnosis not present

## 2020-11-01 DIAGNOSIS — H35373 Puckering of macula, bilateral: Secondary | ICD-10-CM | POA: Diagnosis not present

## 2020-11-01 DIAGNOSIS — H401130 Primary open-angle glaucoma, bilateral, stage unspecified: Secondary | ICD-10-CM | POA: Diagnosis not present

## 2020-11-17 DIAGNOSIS — J300 Vasomotor rhinitis: Secondary | ICD-10-CM | POA: Diagnosis not present

## 2020-11-17 DIAGNOSIS — H903 Sensorineural hearing loss, bilateral: Secondary | ICD-10-CM | POA: Diagnosis not present

## 2020-11-17 DIAGNOSIS — R42 Dizziness and giddiness: Secondary | ICD-10-CM | POA: Diagnosis not present

## 2020-11-17 DIAGNOSIS — J301 Allergic rhinitis due to pollen: Secondary | ICD-10-CM | POA: Diagnosis not present

## 2020-11-29 DIAGNOSIS — E113313 Type 2 diabetes mellitus with moderate nonproliferative diabetic retinopathy with macular edema, bilateral: Secondary | ICD-10-CM | POA: Diagnosis not present

## 2020-12-26 ENCOUNTER — Other Ambulatory Visit: Payer: Self-pay | Admitting: Internal Medicine

## 2020-12-27 ENCOUNTER — Telehealth: Payer: Self-pay | Admitting: Internal Medicine

## 2020-12-27 DIAGNOSIS — Z794 Long term (current) use of insulin: Secondary | ICD-10-CM

## 2020-12-27 DIAGNOSIS — H35373 Puckering of macula, bilateral: Secondary | ICD-10-CM | POA: Diagnosis not present

## 2020-12-27 DIAGNOSIS — E113312 Type 2 diabetes mellitus with moderate nonproliferative diabetic retinopathy with macular edema, left eye: Secondary | ICD-10-CM | POA: Diagnosis not present

## 2020-12-27 DIAGNOSIS — E113311 Type 2 diabetes mellitus with moderate nonproliferative diabetic retinopathy with macular edema, right eye: Secondary | ICD-10-CM | POA: Diagnosis not present

## 2020-12-27 DIAGNOSIS — E113313 Type 2 diabetes mellitus with moderate nonproliferative diabetic retinopathy with macular edema, bilateral: Secondary | ICD-10-CM | POA: Diagnosis not present

## 2020-12-27 DIAGNOSIS — E1159 Type 2 diabetes mellitus with other circulatory complications: Secondary | ICD-10-CM

## 2020-12-27 MED ORDER — ACCU-CHEK GUIDE VI STRP: ORAL_STRIP | 3 refills | Status: AC

## 2020-12-27 NOTE — Telephone Encounter (Signed)
PT called needing prescription for the following: glucose blood (ACCU-CHEK GUIDE) test strip  Forward to:  [facility] [address], [place] - [address] Phone:  [phone]  Fax:  [phone]

## 2020-12-27 NOTE — Telephone Encounter (Signed)
RX has now been sent °

## 2020-12-30 ENCOUNTER — Ambulatory Visit: Payer: Medicare HMO

## 2021-01-02 ENCOUNTER — Other Ambulatory Visit (HOSPITAL_BASED_OUTPATIENT_CLINIC_OR_DEPARTMENT_OTHER): Payer: Self-pay

## 2021-01-02 ENCOUNTER — Ambulatory Visit: Payer: Medicare HMO | Attending: Internal Medicine

## 2021-01-02 DIAGNOSIS — Z23 Encounter for immunization: Secondary | ICD-10-CM

## 2021-01-02 MED ORDER — PFIZER COVID-19 VAC BIVALENT 30 MCG/0.3ML IM SUSP
INTRAMUSCULAR | 0 refills | Status: DC
  Filled 2021-01-02: qty 0.3, 1d supply, fill #0

## 2021-01-02 NOTE — Progress Notes (Signed)
° °  Covid-19 Vaccination Clinic  Name:  Laura Ferguson    MRN: [id number] DOB: 1932/05/20  01/02/2021  [name] was observed post Covid-19 immunization for 15 minutes without incident. She was provided with Vaccine Information Sheet and instruction to access the V-Safe system.   [name] was instructed to call 911 with any severe reactions post vaccine: Difficulty breathing  Swelling of face and throat  A fast heartbeat  A bad rash all over body  Dizziness and weakness   Immunizations Administered     Name Date Dose VIS Date Route   Pfizer Covid-19 Vaccine Bivalent Booster 01/02/2021 12:50 PM 0.3 mL 09/14/2020 Intramuscular   Manufacturer: [employer], [employer]   Lot: [id number]   NDC: [phone]

## 2021-01-04 ENCOUNTER — Other Ambulatory Visit: Payer: Self-pay | Admitting: Cardiovascular Disease

## 2021-01-04 DIAGNOSIS — I10 Essential (primary) hypertension: Secondary | ICD-10-CM

## 2021-01-23 ENCOUNTER — Ambulatory Visit (INDEPENDENT_AMBULATORY_CARE_PROVIDER_SITE_OTHER): Payer: Medicare HMO | Admitting: Cardiovascular Disease

## 2021-01-23 ENCOUNTER — Other Ambulatory Visit: Payer: Self-pay

## 2021-01-23 ENCOUNTER — Encounter: Payer: Self-pay | Admitting: Cardiovascular Disease

## 2021-01-23 VITALS — BP 158/70 | HR 77 | Ht 63.0 in | Wt 136.6 lb

## 2021-01-23 DIAGNOSIS — E1159 Type 2 diabetes mellitus with other circulatory complications: Secondary | ICD-10-CM

## 2021-01-23 DIAGNOSIS — E785 Hyperlipidemia, unspecified: Secondary | ICD-10-CM | POA: Diagnosis not present

## 2021-01-23 DIAGNOSIS — K219 Gastro-esophageal reflux disease without esophagitis: Secondary | ICD-10-CM

## 2021-01-23 DIAGNOSIS — Z794 Long term (current) use of insulin: Secondary | ICD-10-CM

## 2021-01-23 DIAGNOSIS — I2510 Atherosclerotic heart disease of native coronary artery without angina pectoris: Secondary | ICD-10-CM | POA: Diagnosis not present

## 2021-01-23 DIAGNOSIS — I10 Essential (primary) hypertension: Secondary | ICD-10-CM

## 2021-01-23 DIAGNOSIS — Z951 Presence of aortocoronary bypass graft: Secondary | ICD-10-CM | POA: Diagnosis not present

## 2021-01-23 NOTE — Patient Instructions (Signed)
Medication Instructions:  The current medical regimen is effective;  continue present plan and medications as directed. Please refer to the Current Medication list given to you today.   *If you need a refill on your cardiac medications before your next appointment, please call your pharmacy*  Lab Work:   Testing/Procedures:  NONE    NONE  Follow-Up: Your next appointment:  6-9 month(s) In Person with Nicki Guadalajara, MD   Please call our office 2 months in advance to schedule this appointment  At [facility], you and your health needs are our priority.  As part of our continuing mission to provide you with exceptional heart care, we have created designated Provider Care Teams.  These Care Teams include your primary Cardiologist (physician) and Advanced Practice Providers (APPs -  Physician Assistants and Nurse Practitioners) who all work together to provide you with the care you need, when you need it.  We recommend signing up for the patient portal called "MyChart".  Sign up information is provided on this After Visit Summary.  MyChart is used to connect with patients for Virtual Visits (Telemedicine).  Patients are able to view lab/test results, encounter notes, upcoming appointments, etc.  Non-urgent messages can be sent to your provider as well.   To learn more about what you can do with MyChart, go to [email or web address].
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
## 2021-01-24 DIAGNOSIS — E113313 Type 2 diabetes mellitus with moderate nonproliferative diabetic retinopathy with macular edema, bilateral: Secondary | ICD-10-CM | POA: Diagnosis not present

## 2021-01-24 DIAGNOSIS — H35372 Puckering of macula, left eye: Secondary | ICD-10-CM | POA: Diagnosis not present

## 2021-01-26 ENCOUNTER — Other Ambulatory Visit: Payer: Self-pay

## 2021-01-26 ENCOUNTER — Ambulatory Visit (INDEPENDENT_AMBULATORY_CARE_PROVIDER_SITE_OTHER): Payer: Medicare HMO | Admitting: Internal Medicine

## 2021-01-26 ENCOUNTER — Encounter: Payer: Self-pay | Admitting: Internal Medicine

## 2021-01-26 VITALS — BP 128/72 | HR 64 | Ht 63.0 in | Wt 135.6 lb

## 2021-01-26 DIAGNOSIS — E1159 Type 2 diabetes mellitus with other circulatory complications: Secondary | ICD-10-CM | POA: Diagnosis not present

## 2021-01-26 DIAGNOSIS — E785 Hyperlipidemia, unspecified: Secondary | ICD-10-CM | POA: Diagnosis not present

## 2021-01-26 DIAGNOSIS — Z794 Long term (current) use of insulin: Secondary | ICD-10-CM

## 2021-01-26 LAB — POCT GLYCOSYLATED HEMOGLOBIN (HGB A1C): Hemoglobin A1C: 8.1 % — AB (ref 4.0–5.6)

## 2021-01-26 NOTE — Patient Instructions (Addendum)
Please continue: - Levemir 22 units in a.m. and 8 units at bedtime (EVERY NIGHT) - Novolog 10-12 units 15 min before meals  DO NOT CORRECT the blood sugars after meals.  Please return in 4 months with your sugar log.

## 2021-01-26 NOTE — Progress Notes (Signed)
atient ID: Laura Ferguson, female   DOB: 06-19-1932, 88 y.o.   MRN: [id number]   This visit occurred during the SARS-CoV-2 public health emergency.  Safety protocols were in place, including screening questions prior to the visit, additional usage of staff PPE, and extensive cleaning of exam room while observing appropriate contact time as indicated for disinfecting solutions.   HPI: HELAINA Ferguson is a 88 y.o.-year-old female, returning for f/u for DM2, dx in 1990s, insulin-dependent for years, uncontrolled, with complications (CAD - s/p CABG x6, CKD, DR, PN). Last visit 4 months ago.  Interim hx: No increased urination, nausea, chest pain.  She has no complaints at this visit. She continues to have blurry vision-gets intraocular injections.  Reviewed HbA1c levels: Lab Results  Component Value Date   HGBA1C 7.8 (A) 09/22/2020   HGBA1C 8.3 (H) 07/14/2020   HGBA1C 7.9 (A) 05/19/2020   HGBA1C 8.4 (A) 01/20/2020   HGBA1C 8.1 (A) 09/23/2019   HGBA1C 8.5 (A) 06/18/2019   HGBA1C 7.9 (A) 02/17/2019   HGBA1C 8.4 (A) 10/16/2018   HGBA1C 8.8 (A) 06/16/2018   HGBA1C 8.9 (A) 01/29/2018   HGBA1C 9.4 (A) 10/15/2017   HGBA1C 8.5 04/18/2017   HGBA1C 9.1 01/17/2017   HGBA1C 8.8 10/16/2016   HGBA1C 11.7 (H) 04/02/2016   HGBA1C 10.0 (H) 01/03/2016   HGBA1C 9.5 (H) 09/30/2015   HGBA1C 16.2 (H) 06/27/2015   HGBA1C 14.3 (H) 03/21/2015   HGBA1C (H) 05/08/2007    9.6 (NOTE)   The ADA recommends the following therapeutic goals for glycemic   control related to Hgb A1C measurement:   Goal of Therapy:   < 7.0% Hgb A1C   Action Suggested:  > 8.0% Hgb A1C   Ref:  Diabetes Care, 22, Suppl. 1, 1999   She is usually not following the recommended regimen despite repeated advice at every visit: In the past, she was on: - Levemir 30 units in a.m. and 15 units units at bedtime (but skips it if sugars are at goal!!! - takes it maybe 2x a week) - Novolog 12-15 units 15 min before meals (but actually takes 12 units in am  and 10 units before lunch and dinner)  Then on: - Levemir 30 units in a.m. and only occasionally, 12 units units at bedtime - Novolog 12 units 15 min before meals  Then on: - Levemir 30 units in a.m. >> not taking - Novolog 12-15 units 15 min before meals - only using 6-12 units and misses doses  At last visit: - Levemir 30 units in a.m. and 10 units at bedtime >> taking 0-10 units - Novolog 12-15 units 15 min before meals >> cannot tell exactly how much she takes but apparently close to: 12 units in am  0 units before lunch 0-12 units before dinner   At this visit: - Levemir 24 >> actually: 22 units in a.m. and 10 >> actually: 8 units at bedtime (EVERY NIGHT) - Novolog 6-12 >> actually: 10-12 units 15 min before meals  She checks sugars 3 times a day:  - am:   135, 162-257, 340 >> 123-316 >> 67, 146-243 >> 77-135, 200 - 2h after b'fast: 59, 88, 200, 266 >> n/c >> 71, 96-143, 326 >> 57, 126-251 - before lunch: 271, 299 >> 85-343 >> 64-182, 260 >> 191-242 - 2h after lunch:  356 >> 194-283 >> 81-252 >> 172-216 - before dinner:  95, 136-267, 345 >> 107-349 >> 98-229 >> 60-297 - 2h after dinner:75, 142-321 >>  60-228 >> 130-161 >> 140-255 - bedtime: 217, 209 >> n/c >> 80-178  >> n/c - nighttime:  135-268, 312 >> 176, 215 >> 186 >> 160-228 Lowest sugar was 60 >> 64 >> 57; it is unclear at which level she has hypoglycemia awareness Highest sugar was 451 ... >> 326 >> 297.  Glucometer: AccuChek  Pt's meals are: - Breakfast: oatmeal, grits, apple sauce - Lunch: tuna sandwich, bologna - Dinner: soup, sandwich  -+ CKD, last BUN/creatinine:  Lab Results  Component Value Date   BUN 25 07/14/2020   BUN 38 (H) 03/02/2020   CREATININE 1.27 (H) 07/14/2020   CREATININE 1.47 (H) 03/02/2020  On losartan 75.  -+ HL; last set of lipids: Lab Results  Component Value Date   CHOL 139 07/14/2020   HDL 70 07/14/2020   LDLCALC 53 07/14/2020   TRIG 84 07/14/2020   CHOLHDL 2.0 07/14/2020   On Crestor 10.  Cardiologist: Dr. Tresa EndoKelly.   - last eye exam was in 08/2020: + DR, she gets intraocular injections. Dr. Tomie ChinaZane.  - she has numbness and tingling in her feet.  She also has HTN.  Both of her sons died in the 2 weeks in 2021: one of Covid, one unexpectedly!   ROS: + see HPI + Blurry vision Neurological: no tremors/+ numbness/+ tingling/no dizziness  I reviewed pt's medications, allergies, PMH, social hx, family hx, and changes were documented in the history of present illness. Otherwise, unchanged from my initial visit note.  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 Bishop Limbo. Kelly)   CORONARY ARTERY BYPASS GRAFT  05/09/2007   LIMA to LAD, veing to intermediate; SVG to OM1 & OM2; SVG to acute marginal & distal RCA (Dr. Dorris FetchHendrickson)   TRANSTHORACIC ECHOCARDIOGRAM  02/13/2010   EF =>55%, vigorous contraction EF 65%; LA mild-mod dilated; IV normal diameter - normal CVP; trace MR; mild TR; trace AV regurg   Social History   Social History   Marital status: Widowed    Spouse name: N/A   Number of children: 8   Occupational History   homemaker   Social History Main Topics   Smoking status: Never Smoker   Smokeless tobacco: Never Used     Comment: quit 1960's  smoked very lightly.   Alcohol use No   Drug use: No     Social History Narrative   Married.   Retired.   Works as a Engineer, structuralCaregiver.    Enjoys  helping other.    Current Outpatient Medications on File Prior to Visit  Medication Sig Dispense Refill   losartan (COZAAR) 100 MG tablet Take 1 tablet by mouth once daily 90 tablet 3   Acetaminophen (TYLENOL 8 HOUR PO) Take 1 tablet by mouth every 8 (eight) hours as needed.      aspirin 81 MG tablet Take 81 mg by mouth daily.  Azelastine HCl 137 MCG/SPRAY SOLN As needed     COVID-19 mRNA bivalent vaccine, Pfizer, (PFIZER COVID-19 VAC BIVALENT) injection Inject into the muscle. 0.3 mL 0   glucose blood (ACCU-CHEK GUIDE) test strip USE TO CHECK SUGAR THREE TIMES DAILY AS DIRECTED 250 each 3   Insulin Pen Needle (BD PEN NEEDLE NANO 2ND GEN) 32G X 4 MM MISC USE WITH INSULIN PEN 4 TIMES DAILY. 300 each 3   LEVEMIR FLEXTOUCH 100 UNIT/ML FlexTouch Pen Inject under skin 24 units in am and 10 units at night 30 mL 3   metoprolol succinate (TOPROL-XL) 50 MG 24 hr tablet TAKE ONE TABLET BY MOUTH IN THE MORNING AND TAKE ONE-HALF TABLET BY MOUTH IN THE EVENING (NEED TO SCHEDULE AN APPOINTMENT) 135 tablet 1   Multiple Vitamin (MULTIVITAMIN) capsule Take 1 capsule by mouth daily.     NOVOLOG FLEXPEN 100 UNIT/ML FlexPen Inject 8-12 units 3x a day before meals under skin 30 mL 3   pantoprazole (PROTONIX) 40 MG tablet Take 1 tablet by mouth once daily 90 tablet 3   Polyethyl Glycol-Propyl Glycol (SYSTANE OP) Apply 2 drops to eye 2 (two) times daily.      prednisoLONE acetate (PRED FORTE) 1 % ophthalmic suspension      rosuvastatin (CRESTOR) 10 MG tablet Take 1 tablet by mouth once daily 90 tablet 3   TRAVATAN Z 0.004 % SOLN ophthalmic solution Place 1 drop into both eyes at bedtime.     No current facility-administered medications on file prior to visit.   No Known Allergies   Family History  Problem Relation Age of Onset   Cancer Mother    Diabetes Sister    Cancer Child    Pt has FH of DM in [place], sister - died from DM complications (refused to start HD), sons - 1 son died from DM  complications.  Her youngest son died 02-17-2017 (cancer). He was 53.  PE: BP 128/72 (BP Location: Right Arm, Patient Position: Sitting, Cuff Size: Normal)    Pulse 64    Ht 5\' 3"  (1.6 m)    Wt 135 lb 9.6 oz (61.5 kg)    SpO2 97%    BMI 24.02 kg/m  Wt Readings from Last 3 Encounters:  01/26/21 135 lb 9.6 oz (61.5 kg)  01/23/21 136 lb 9.6 oz (62 kg)  09/22/20 135 lb 9.6 oz (61.5 kg)   Constitutional: normal weight, in NAD Eyes: PERRLA, EOMI, no exophthalmos ENT: moist mucous membranes, no thyromegaly, no cervical lymphadenopathy Cardiovascular: RRR, No MRG Respiratory: CTA BMusculoskeletal: no deformities, strength intact in all 4 Skin: moist, warm, no rashes Neurological: no tremor with outstretched hands, DTR normal in all 4  ASSESSMENT: 1. DM2, insulin-dependent, uncontrolled, with complications - CAD - s/p CABG x6 - CKD - DR - PN  2. HL  3. PN  PLAN:  1. Patient with longstanding, uncontrolled, type 2 diabetes, on basal-bolus insulin regimen, with previous deterioration of her diabetes control after her sons' deaths.  However, the major hurdle in her diabetes control is that she is not following the recommended regimen, missing doses of insulin, including Levemir, and skipping NovoLog but taking different doses than suggested.  We discussed at every visit about the importance of taking NovoLog before every meal and to try to at least start with the recommended doses and adjust the doses rationally, based on the observed patterns.  At last visit, she was again varying the dose of Levemir at night and still not taking NovoLog  if her sugars were at goal before meals.   -At this visit, reviewing her blood sugar log, they are still quite fluctuating.  They appear to be higher after breakfast and before lunch.  However, they are improving in the last week, now after the holidays.  Upon questioning, she is not losing quite to the same insulin doses that I recommended at last visit, but very  close.  This is actually the best compliance that she should in the last 2 years.  Therefore, for now, I did not suggest a change in regimen.  She is doing a good job checking her sugars frequently. - I suggested to:  Patient Instructions  Please continue: - Levemir 22 units in a.m. and 8 units at bedtime (EVERY NIGHT) - Novolog 10-12 units 15 min before meals  DO NOT CORRECT the blood sugars after meals.  Please return in 4 months with your sugar log.  - we checked her HbA1c: 8.1% (higher) - advised to check sugars at different times of the day - 4x a day, rotating check times - advised for yearly eye exams >> she is UTD - return to clinic in 4 months  2. HL -Reviewed latest lipid panel from 06/2020: All fractions at goal: Lab Results  Component Value Date   CHOL 139 07/14/2020   HDL 70 07/14/2020   LDLCALC 53 07/14/2020   TRIG 84 07/14/2020   CHOLHDL 2.0 07/14/2020  -She continues on Crestor 10 mg daily without side effects   Carlus Pavlovristina Aysia Lowder, MD PhD [facility]

## 2021-02-03 DIAGNOSIS — J0120 Acute ethmoidal sinusitis, unspecified: Secondary | ICD-10-CM | POA: Diagnosis not present

## 2021-02-07 NOTE — Progress Notes (Signed)
Subjective:   Laura Ferguson is a 88 y.o. female who presents for Medicare Annual (Subsequent) preventive examination.  I connected with Laura Ferguson today by telephone and verified that I am speaking with the correct person using two identifiers. Location patient: home Location provider: work Persons participating in the virtual visit: patient, Engineer, civil (consulting).    I discussed the limitations, risks, security and privacy concerns of performing an evaluation and management service by telephone and the availability of in person appointments. I also discussed with the patient that there may be a patient responsible charge related to this service. The patient expressed understanding and verbally consented to this telephonic visit.    Interactive audio and video telecommunications were attempted between this provider and patient, however failed, due to patient having technical difficulties OR patient did not have access to video capability.  We continued and completed visit with audio only.  Some vital signs may be absent or patient reported.   Time Spent with patient on telephone encounter: 25 minutes  Review of Systems     Cardiac Risk Factors include: advanced age (>66men, >68 women);hypertension;dyslipidemia;diabetes mellitus     Objective:    Today's Vitals   02/08/21 0813  Weight: 135 lb (61.2 kg)  Height:  (1.6 m)   Body mass index is 23.91 kg/m.  Advanced Directives 02/08/2021 10/13/2019 05/19/2019 11/05/2018 08/09/2016 06/08/2016 12/05/2014  Does Patient Have a Medical Advance Directive? No No No No No No No  Would patient like information on creating a medical advance directive? Yes (MAU/Ambulatory/Procedural Areas - Information given) Yes (MAU/Ambulatory/Procedural Areas - Information given) No - Patient declined No - Patient declined - No - Patient declined -    Current Medications (verified) Outpatient Encounter Medications as of 02/08/2021  Medication Sig   Acetaminophen (TYLENOL  8 HOUR PO) Take 1 tablet by mouth every 8 (eight) hours as needed.    aspirin 81 MG tablet Take 81 mg by mouth daily.   Azelastine HCl 137 MCG/SPRAY SOLN As needed   COVID-19 mRNA bivalent vaccine, Pfizer, (PFIZER COVID-19 VAC BIVALENT) injection Inject into the muscle.   glucose blood (ACCU-CHEK GUIDE) test strip USE TO CHECK SUGAR THREE TIMES DAILY AS DIRECTED   Insulin Pen Needle (BD PEN NEEDLE NANO 2ND GEN) 32G X 4 MM MISC USE WITH INSULIN PEN 4 TIMES DAILY.   LEVEMIR FLEXTOUCH 100 UNIT/ML FlexTouch Pen Inject under skin 24 units in am and 10 units at night   losartan (COZAAR) 100 MG tablet Take 1 tablet by mouth once daily   metoprolol succinate (TOPROL-XL) 50 MG 24 hr tablet TAKE ONE TABLET BY MOUTH IN THE MORNING AND TAKE ONE-HALF TABLET BY MOUTH IN THE EVENING (NEED TO SCHEDULE AN APPOINTMENT)   Multiple Vitamin (MULTIVITAMIN) capsule Take 1 capsule by mouth daily.   NOVOLOG FLEXPEN 100 UNIT/ML FlexPen Inject 8-12 units 3x a day before meals under skin   pantoprazole (PROTONIX) 40 MG tablet Take 1 tablet by mouth once daily   Polyethyl Glycol-Propyl Glycol (SYSTANE OP) Apply 2 drops to eye 2 (two) times daily.    prednisoLONE acetate (PRED FORTE) 1 % ophthalmic suspension    rosuvastatin (CRESTOR) 10 MG tablet Take 1 tablet by mouth once daily   TRAVATAN Z 0.004 % SOLN ophthalmic solution Place 1 drop into both eyes at bedtime.   No facility-administered encounter medications on file as of 02/08/2021.    Allergies (verified) Patient has no known allergies.   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 Corky Downs)   CORONARY ARTERY BYPASS GRAFT  05/09/2007   LIMA to LAD, veing to intermediate; SVG to OM1 & OM2; SVG to acute marginal & distal RCA (Dr. Roxan Hockey)   TRANSTHORACIC ECHOCARDIOGRAM  02/13/2010   EF =>55%, vigorous contraction EF 65%; LA mild-mod dilated; IV normal diameter - normal CVP; trace MR; mild TR; trace AV regurg   Family History  Problem Relation Age of Onset   Cancer Mother    Diabetes Sister    Cancer Child    Social History   Socioeconomic History   Marital status: Widowed    Spouse name: Not on file   Number of children: 7   Years of education: Not on file   Highest education level: Not on file  Occupational History   Not on file  Tobacco Use   Smoking status: Never   Smokeless tobacco: Never   Tobacco comments:    quit 1960's  smoked very lightly.  Substance and Sexual Activity   Alcohol use: No    Alcohol/week: 0.0 standard drinks   Drug use: No   Sexual activity: Not on file  Other Topics Concern   Not on file  Social History Narrative   Married.   Retired.   Works as a Building control surveyor.    Enjoys helping other.    Social Determinants of Health   Financial Resource Strain: Low Risk    Difficulty of Paying Living Expenses: Not hard at all  Food Insecurity: No Food Insecurity   Worried About Charity fundraiser in the Last Year: Never true   [place] in the Last Year: Never true  Transportation Needs: No Transportation Needs   Lack of Transportation (Medical): No   Lack of Transportation (Non-Medical): No  Physical Activity: Inactive   Days of Exercise per Week: 0 days   Minutes of Exercise per Session: 0 min  Stress: No Stress Concern Present   Feeling of Stress : Only a little  Social Connections: Moderately Isolated   Frequency of  Communication with Friends and Family: More than three times a week   Frequency of Social Gatherings with Friends and Family: More than three times a week   Attends Religious Services: More than 4 times per year   Active Member of [employer] or Organizations: No   Attends Archivist Meetings: Never   Marital Status: Widowed    Tobacco Counseling Counseling given: Not Answered Tobacco comments: quit 1960's  smoked very lightly.   Clinical Intake:  Pre-visit preparation completed: Yes  Pain : No/denies pain     BMI - recorded: 23.91 Nutritional Status: BMI of 19-24  Normal Nutritional Risks: None Diabetes: Yes CBG done?: No Did pt. bring in CBG monitor from home?: No  How often do you need to have someone help you when you read instructions, pamphlets, or other  written materials from your doctor or pharmacy?: 1 - Never  Diabetes:  Is the patient diabetic?  Yes  If diabetic, was a CBG obtained today?  No  Did the patient bring in their glucometer from home?  No  How often do you monitor your CBG's? daily.   Financial Strains and Diabetes Management:  Are you having any financial strains with the device, your supplies or your medication? No .  Does the patient want to be seen by Chronic Care Management for management of their diabetes?  No  Would the patient like to be referred to a Nutritionist or for Diabetic Management?  No   Diabetic Exams:  Diabetic Eye Exam: Completed 09/14/20.  Diabetic Foot Exam: Pt has been advised about the importance in completing this exam.    Interpreter Needed?: No  Information entered by :: Orrin Brigham LPN   Activities of Daily Living In your present state of health, do you have any difficulty performing the following activities: 02/08/2021  Hearing? N  Vision? N  Difficulty concentrating or making decisions? N  Walking or climbing stairs? N  Dressing or bathing? N  Doing errands, shopping? N  Preparing Food and eating ?  N  Using the Toilet? N  In the past six months, have you accidently leaked urine? N  Do you have problems with loss of bowel control? N  Managing your Medications? N  Managing your Finances? N  Housekeeping or managing your Housekeeping? N  Some recent data might be hidden    Patient Care Team: Pleas Koch, NP as PCP - General (Internal Medicine) Troy Sine, MD as PCP - Cardiology (Cardiology)  Indicate any recent Medical Services you may have received from other than [facility] providers in the past year (date may be approximate).     Assessment:   This is a routine wellness examination for Jimmy.  Hearing/Vision screen Hearing Screening - Comments:: No issues Vision Screening - Comments:: Last exam 09/14/20, Dr. Edison Pace,   Dietary issues and exercise activities discussed: Current Exercise Habits: Home exercise routine, Type of exercise: strength training/weights;stretching   Goals Addressed             This Visit's Progress    Patient Stated       Would like to maintain current routine.       Depression Screen PHQ 2/9 Scores 02/08/2021 10/13/2019 09/22/2019 05/19/2019 10/16/2017 06/08/2016  PHQ - 2 Score 1 5 6 2 1 1   PHQ- 9 Score - 8 17 2  - -    Fall Risk Fall Risk  02/08/2021 06/08/2020 05/19/2019 10/16/2017 06/08/2016  Falls in the past year? 0 0 1 No No  Comment - - hanging curtains - -  Number falls in past yr: 0 0 0 - -  Injury with Fall? 0 0 0 - -  Risk for fall due to : - - Medication side effect - -  Follow up Falls prevention discussed Falls evaluation completed Falls evaluation completed;Falls prevention discussed - -    FALL RISK PREVENTION PERTAINING TO THE HOME:  Any stairs in or around the home? Yes  If so, are there any without handrails? No  Home free of loose throw rugs in walkways, pet beds, electrical cords, etc? Yes  Adequate lighting in your home to reduce risk of falls? Yes   ASSISTIVE DEVICES UTILIZED TO PREVENT FALLS:  Life alert? No  Use  of a cane, walker or w/c? No  Grab bars in the bathroom? Yes  Shower chair or bench in shower? No  Elevated toilet seat or a handicapped toilet? No   TIMED UP AND GO:  Was the test performed? No .    Cognitive Function: Normal cognitive status assessed by  this Nurse Health Advisor. No abnormalities found.   MMSE - Mini Mental State Exam 05/19/2019  Orientation to time 5  Orientation to Place 5  Registration 3  Attention/ Calculation 5  Recall 3  Language- repeat 1        Immunizations Immunization History  Administered Date(s) Administered   Fluad Quad(high Dose 65+) 10/16/2018, 10/13/2019   Influenza,inj,Quad PF,6+ Mos 09/25/2017   PFIZER Comirnaty(Gray Top)Covid-19 Tri-Sucrose Vaccine 07/05/2020   PFIZER(Purple Top)SARS-COV-2 Vaccination 03/15/2019, 04/04/2019, 09/15/2019   Pfizer Covid-19 Vaccine Bivalent Booster 75yrs & up 01/02/2021   Pneumococcal Conjugate-13 10/13/2019    TDAP status: Due, Education has been provided regarding the importance of this vaccine. Advised may receive this vaccine at local pharmacy or Health Dept. Aware to provide a copy of the vaccination record if obtained from local pharmacy or Health Dept. Verbalized acceptance and understanding.  Flu Vaccine status: Due, Education has been provided regarding the importance of this vaccine. Advised may receive this vaccine at local pharmacy or Health Dept. Aware to provide a copy of the vaccination record if obtained from local pharmacy or Health Dept. Verbalized acceptance and understanding.  Pneumococcal vaccine status: Due, Education has been provided regarding the importance of this vaccine. Advised may receive this vaccine at local pharmacy or Health Dept. Aware to provide a copy of the vaccination record if obtained from local pharmacy or Health Dept. Verbalized acceptance and understanding.  Covid-19 vaccine status: Completed vaccines  Qualifies for Shingles Vaccine? Yes   Zostavax completed No    Shingrix Completed?: No.    Education has been provided regarding the importance of this vaccine. Patient has been advised to call insurance company to determine out of pocket expense if they have not yet received this vaccine. Advised may also receive vaccine at local pharmacy or Health Dept. Verbalized acceptance and understanding.  Screening Tests Health Maintenance  Topic Date Due   TETANUS/TDAP  Never done   Zoster Vaccines- Shingrix (1 of 2) Never done   DEXA SCAN  Never done   INFLUENZA VACCINE  08/15/2020   Pneumonia Vaccine 74+ Years old (2 - PPSV23 if available, else PCV20) 10/12/2020   FOOT EXAM  10/12/2020   HEMOGLOBIN A1C  07/26/2021   OPHTHALMOLOGY EXAM  09/14/2021   COVID-19 Vaccine  Completed   HPV VACCINES  Aged Out    Health Maintenance  Health Maintenance Due  Topic Date Due   TETANUS/TDAP  Never done   Zoster Vaccines- Shingrix (1 of 2) Never done   DEXA SCAN  Never done   INFLUENZA VACCINE  08/15/2020   Pneumonia Vaccine 24+ Years old (2 - PPSV23 if available, else PCV20) 10/12/2020   FOOT EXAM  10/12/2020    Colorectal cancer screening: No longer required.   Mammogram status: patient no longer would like to complete mammogram   Bone density: no longer required, patient is 28  Lung Cancer Screening: (Low Dose CT Chest recommended if Age 15-80 years, 30 pack-year currently smoking OR have quit w/in 15years.) does not qualify.     Additional Screening:  Hepatitis C Screening: does not qualify  Vision Screening: Recommended annual ophthalmology exams for early detection of glaucoma and other disorders of the eye. Is the patient up to date with their annual eye exam?  Yes  Who is the provider or what is the name of the office in which the patient attends annual eye exams? Dr. Edison Pace   Dental Screening: Recommended annual dental exams for proper oral hygiene  Community Resource Referral / Chronic Care Management: CRR required this visit?  No   CCM  required this visit?  No      Plan:     I have personally reviewed and noted the following in the patients chart:   Medical and social history Use of alcohol, tobacco or illicit drugs  Current medications and supplements including opioid prescriptions.  Functional ability and status Nutritional status Physical activity Advanced directives List of other physicians Hospitalizations, surgeries, and ER visits in previous 12 months Vitals Screenings to include cognitive, depression, and falls Referrals and appointments  In addition, I have reviewed and discussed with patient certain preventive protocols, quality metrics, and best practice recommendations. A written personalized care plan for preventive services as well as general preventive health recommendations were provided to patient.   Due to this being a telephonic visit, the after visit summary with patients personalized plan was offered to patient via mail or my-chart. Patient preferred to pick up at office at next visit.   Loma Messing, LPN  [id number]   Nurse Health Advisor  Nurse Notes: none

## 2021-02-08 ENCOUNTER — Ambulatory Visit (INDEPENDENT_AMBULATORY_CARE_PROVIDER_SITE_OTHER): Payer: Medicare HMO

## 2021-02-08 VITALS — Ht 63.0 in | Wt 135.0 lb

## 2021-02-08 DIAGNOSIS — Z0000 Encounter for general adult medical examination without abnormal findings: Secondary | ICD-10-CM

## 2021-02-08 NOTE — Patient Instructions (Signed)
Laura Ferguson , Thank you for taking time to complete your Medicare Wellness Visit. I appreciate your ongoing commitment to your health goals. Please review the following plan we discussed and let me know if I can assist you in the future.   Screening recommendations/referrals: Colonoscopy: no longer required  Mammogram: due, please discuss with PCP if you change your mind  Bone Density: no longer required Recommended yearly ophthalmology/optometry visit for glaucoma screening and checkup Recommended yearly dental visit for hygiene and checkup  Vaccinations: Influenza vaccine: Due-May obtain vaccine at our office or your local pharmacy. Pneumococcal vaccine: Pneumovax 23 due , May obtain vaccine at our office or your local pharmacy Tdap vaccine: Due-May obtain vaccine your local pharmacy. Shingles vaccine: -May obtain vaccine your local pharmacy.   Covid-19:up to date   Advanced directives: information available at your next visit  Conditions/risks identified: see problem list   Next appointment: Follow up in one year for your annual wellness visit 02/09/22 @ 8:15am, this will be a telephone visit   Preventive Care 65 Years and Older, Female Preventive care refers to lifestyle choices and visits with your health care provider that can promote health and wellness. What does preventive care include? A yearly physical exam. This is also called an annual well check. Dental exams once or twice a year. Routine eye exams. Ask your health care provider how often you should have your eyes checked. Personal lifestyle choices, including: Daily care of your teeth and gums. Regular physical activity. Eating a healthy diet. Avoiding tobacco and drug use. Limiting alcohol use. Practicing safe sex. Taking low-dose aspirin every day. Taking vitamin and mineral supplements as recommended by your health care provider. What happens during an annual well check? The services and screenings done by your  health care provider during your annual well check will depend on your age, overall health, lifestyle risk factors, and family history of disease. Counseling  Your health care provider may ask you questions about your: Alcohol use. Tobacco use. Drug use. Emotional well-being. Home and relationship well-being. Sexual activity. Eating habits. History of falls. Memory and ability to understand (cognition). Work and work Astronomer. Reproductive health. Screening  You may have the following tests or measurements: Height, weight, and BMI. Blood pressure. Lipid and cholesterol levels. These may be checked every 5 years, or more frequently if you are over 15 years old. Skin check. Lung cancer screening. You may have this screening every year starting at age 88 if you have a 30-pack-year history of smoking and currently smoke or have quit within the past 15 years. Fecal occult blood test (FOBT) of the stool. You may have this test every year starting at age 89. Flexible sigmoidoscopy or colonoscopy. You may have a sigmoidoscopy every 5 years or a colonoscopy every 10 years starting at age 16. Hepatitis C blood test. Hepatitis B blood test. Sexually transmitted disease (STD) testing. Diabetes screening. This is done by checking your blood sugar (glucose) after you have not eaten for a while (fasting). You may have this done every 1-3 years. Bone density scan. This is done to screen for osteoporosis. You may have this done starting at age 1. Mammogram. This may be done every 1-2 years. Talk to your health care provider about how often you should have regular mammograms. Talk with your health care provider about your test results, treatment options, and if necessary, the need for more tests. Vaccines  Your health care provider may recommend certain vaccines, such as: Influenza vaccine. This  is recommended every year. Tetanus, diphtheria, and acellular pertussis (Tdap, Td) vaccine. You may  need a Td booster every 10 years. Zoster vaccine. You may need this after age 33. Pneumococcal 13-valent conjugate (PCV13) vaccine. One dose is recommended after age 66. Pneumococcal polysaccharide (PPSV23) vaccine. One dose is recommended after age 60. Talk to your health care provider about which screenings and vaccines you need and how often you need them. This information is not intended to replace advice given to you by your health care provider. Make sure you discuss any questions you have with your health care provider. Document Released: 01/28/2015 Document Revised: 09/21/2015 Document Reviewed: 11/02/2014 Elsevier Interactive Patient Education  2017 [place] Prevention in the Home Falls can cause injuries. They can happen to people of all ages. There are many things you can do to make your home safe and to help prevent falls. What can I do on the outside of my home? Regularly fix the edges of walkways and driveways and fix any cracks. Remove anything that might make you trip as you walk through a door, such as a raised step or threshold. Trim any bushes or trees on the path to your home. Use bright outdoor lighting. Clear any walking paths of anything that might make someone trip, such as rocks or tools. Regularly check to see if handrails are loose or broken. Make sure that both sides of any steps have handrails. Any raised decks and porches should have guardrails on the edges. Have any leaves, snow, or ice cleared regularly. Use sand or salt on walking paths during winter. Clean up any spills in your garage right away. This includes oil or grease spills. What can I do in the bathroom? Use night lights. Install grab bars by the toilet and in the tub and shower. Do not use towel bars as grab bars. Use non-skid mats or decals in the tub or shower. If you need to sit down in the shower, use a plastic, non-slip stool. Keep the floor dry. Clean up any water that spills on  the floor as soon as it happens. Remove soap buildup in the tub or shower regularly. Attach bath mats securely with double-sided non-slip rug tape. Do not have throw rugs and other things on the floor that can make you trip. What can I do in the bedroom? Use night lights. Make sure that you have a light by your bed that is easy to reach. Do not use any sheets or blankets that are too big for your bed. They should not hang down onto the floor. Have a firm chair that has side arms. You can use this for support while you get dressed. Do not have throw rugs and other things on the floor that can make you trip. What can I do in the kitchen? Clean up any spills right away. Avoid walking on wet floors. Keep items that you use a lot in easy-to-reach places. If you need to reach something above you, use a strong step stool that has a grab bar. Keep electrical cords out of the way. Do not use floor polish or wax that makes floors slippery. If you must use wax, use non-skid floor wax. Do not have throw rugs and other things on the floor that can make you trip. What can I do with my stairs? Do not leave any items on the stairs. Make sure that there are handrails on both sides of the stairs and use them. Fix handrails that  are broken or loose. Make sure that handrails are as long as the stairways. Check any carpeting to make sure that it is firmly attached to the stairs. Fix any carpet that is loose or worn. Avoid having throw rugs at the top or bottom of the stairs. If you do have throw rugs, attach them to the floor with carpet tape. Make sure that you have a light switch at the top of the stairs and the bottom of the stairs. If you do not have them, ask someone to add them for you. What else can I do to help prevent falls? Wear shoes that: Do not have high heels. Have rubber bottoms. Are comfortable and fit you well. Are closed at the toe. Do not wear sandals. If you use a stepladder: Make sure  that it is fully opened. Do not climb a closed stepladder. Make sure that both sides of the stepladder are locked into place. Ask someone to hold it for you, if possible. Clearly mark and make sure that you can see: Any grab bars or handrails. First and last steps. Where the edge of each step is. Use tools that help you move around (mobility aids) if they are needed. These include: Canes. Walkers. Scooters. Crutches. Turn on the lights when you go into a dark area. Replace any light bulbs as soon as they burn out. Set up your furniture so you have a clear path. Avoid moving your furniture around. If any of your floors are uneven, fix them. If there are any pets around you, be aware of where they are. Review your medicines with your doctor. Some medicines can make you feel dizzy. This can increase your chance of falling. Ask your doctor what other things that you can do to help prevent falls. This information is not intended to replace advice given to you by your health care provider. Make sure you discuss any questions you have with your health care provider. Document Released: 10/28/2008 Document Revised: 06/09/2015 Document Reviewed: 02/05/2014 Elsevier Interactive Patient Education  2017 [employer].

## 2021-02-14 DIAGNOSIS — H40003 Preglaucoma, unspecified, bilateral: Secondary | ICD-10-CM | POA: Diagnosis not present

## 2021-02-21 DIAGNOSIS — H35373 Puckering of macula, bilateral: Secondary | ICD-10-CM | POA: Diagnosis not present

## 2021-02-21 DIAGNOSIS — E113313 Type 2 diabetes mellitus with moderate nonproliferative diabetic retinopathy with macular edema, bilateral: Secondary | ICD-10-CM | POA: Diagnosis not present

## 2021-02-21 DIAGNOSIS — E113312 Type 2 diabetes mellitus with moderate nonproliferative diabetic retinopathy with macular edema, left eye: Secondary | ICD-10-CM | POA: Diagnosis not present

## 2021-02-21 DIAGNOSIS — E113311 Type 2 diabetes mellitus with moderate nonproliferative diabetic retinopathy with macular edema, right eye: Secondary | ICD-10-CM | POA: Diagnosis not present

## 2021-02-22 DIAGNOSIS — H40003 Preglaucoma, unspecified, bilateral: Secondary | ICD-10-CM | POA: Diagnosis not present

## 2021-02-25 ENCOUNTER — Other Ambulatory Visit: Payer: Self-pay | Admitting: Cardiovascular Disease

## 2021-02-28 ENCOUNTER — Encounter: Payer: Self-pay | Admitting: Primary Care

## 2021-02-28 ENCOUNTER — Telehealth: Payer: Self-pay | Admitting: Primary Care

## 2021-02-28 ENCOUNTER — Telehealth: Payer: Self-pay | Admitting: Cardiovascular Disease

## 2021-02-28 NOTE — Telephone Encounter (Signed)
Noted. Very sorry to hear this about her son.

## 2021-02-28 NOTE — Telephone Encounter (Signed)
I spoke with pt; pt not having any CP or back pain now and no SOB. Pt said last time had CP was 02/27/21. Pt is presently at store with her grandson. Pt said she thinks she is under a lot of stress since buried her last son on 02/23/21. I offered condolences for her loss and pt said if she has CP or SOB again she will go to ED for eval.  No available appts at [facility].Sending note to Allayne Gitelman NP and [facility] CMA. And will teams joellen.

## 2021-02-28 NOTE — Telephone Encounter (Signed)
[facility] Day - Client TELEPHONE ADVICE RECORD AccessNurse Patient Name: Laura Ferguson Gender: Female DOB: [birth year] Age: 88 Y 8 M 15 D Return Phone Number: [phone] (Primary) Address: City/ State/ Zip: [place]  [address] Client [facility] Day - Client Client Site  [facility] - Day Contact Type Call Who Is Calling Patient / Member / Family / Caregiver Call Type Triage / Clinical Relationship To Patient Self Return Phone Number [phone] (Primary) Chief Complaint CHEST PAIN - pain, pressure, heaviness or tightness Reason for Call Symptomatic / Request for Health Information Initial Comment Caller states she is having severe rib pain around to her back Translation No Nurse Assessment Nurse: Charna Elizabeth, RN, Lynden Ang Date/Time (Eastern Time): 02/28/2021 12:53:11 PM Confirm and document reason for call. If symptomatic, describe symptoms. ---Laura Ferguson states that she developed left chest pain that goes around to her back several months ago that has become worse recently (current pain rated as an 5 on the 1 to 10 scale). No severe breathing difficulty. No fever. Alert and responsive. Does the patient have any new or worsening symptoms? ---Yes Will a triage be completed? ---Yes Related visit to physician within the last 2 weeks? ---No Does the PT have any chronic conditions? (i.e. diabetes, asthma, this includes High risk factors for pregnancy, etc.) ---Yes List chronic conditions. ---Heart surgery 15 years ago, Diabetes, High Blood Pressure Is this a behavioral health or substance abuse call? ---No Guidelines Guideline Title Affirmed Question Affirmed Notes Nurse Date/Time (Eastern Time) Chest Pain Pain also in shoulder(s) or arm(s) or jaw (Exception: pain is clearly made worse by movement) [place], RN, Lynden Ang 02/28/2021 12:57:10 PM PLEASE NOTE: All timestamps contained within this report are  represented as [place] Standard Time. CONFIDENTIALTY NOTICE: This fax transmission is intended only for the addressee. It contains information that is legally privileged, confidential or otherwise protected from use or disclosure. If you are not the intended recipient, you are strictly prohibited from reviewing, disclosing, copying using or disseminating any of this information or taking any action in reliance on or regarding this information. If you have received this fax in error, please notify us immediately by telephone so that we can arrange for its return to [place]. Phone: [phone], Toll-Free: [phone], Fax: [phone] Page: 2 of 2 Call Id: [id number] Disp. Time [name] Time) Disposition Final User 02/28/2021 12:48:19 PM Send to Urgent Queue Emeline Gins 02/28/2021 1:05:59 PM Go to ED Now Yes Charna Elizabeth, RN, Frann Rider Disagree/Comply Disagree Caller Understands Yes PreDisposition Call Doctor Care Advice Given Per Guideline GO TO ED NOW: * You need to be seen in the Emergency Department. * Go to the ED at ___________ Hospital. * Leave now. Drive carefully. NOTE TO TRIAGER - DRIVING: * Another adult should drive. * Patient should not delay going to the emergency department. * If immediate transportation is not available via car, rideshare (e.g., Lyft, Uber), or taxi, then the patient should be instructed to call [id number]. * Passes out or becomes too weak to stand * Severe difficulty breathing occurs CALL EMS IF: * You become worse CARE ADVICE given per Chest Pain (Adult) guideline. Comments User: Laura Bald, RN Date/Time [name] Time): 02/28/2021 1:06:39 PM Laura Ferguson declined the Go to ER disposition. She requests to be seen by her MD. User: Laura Bald, RN Date/Time [name] Time): 02/28/2021 1:08:34 PM Connected Laura Ferguson with Amy via the office backline. Referrals GO TO FACILITY REFUSE

## 2021-02-28 NOTE — Telephone Encounter (Signed)
Pt c/o of Chest Pain: STAT if CP now or developed within 24 hours  1. Are you having CP right now? no  2. Are you experiencing any other symptoms (ex. SOB, nausea, vomiting, sweating)? no  3. How long have you been experiencing CP? Over a year  4. Is your CP continuous or coming and going? Comes and goes  5. Have you taken Nitroglycerin? no   Patient states she has been having chest pain for over a year. She says it feels like a fullness under her breast. She says she also had a rapid heart beat last night. She says she was given an ekg at her last appointment and it was fine. She says she did recently lose her son and burried him Thursday and is not sure if it is due to stress.  ?

## 2021-02-28 NOTE — Telephone Encounter (Signed)
Received call from Access Nurse regarding this patient.  Chest pains under ribcage, been occurring for a while.  Outcome was ED, but patient did not want to go.  MD had no openings, and patient stated she would possibly go to ED if needed, as it does hurt  Told her would have our triage nurse follow-up

## 2021-02-28 NOTE — Telephone Encounter (Signed)
Spoke to patient.  Patient states she has had this discomfort for over year off and on . She''s states she is able to  do her activity with out any distress. She states primary is not seeing any patient due to  renovations. Offered her  an appointment for March 23, 2021. She verbalized understanding.  She is aware if symptoms become worse got to Er . Marland Kitchen

## 2021-03-20 ENCOUNTER — Telehealth: Payer: Self-pay

## 2021-03-20 NOTE — Telephone Encounter (Signed)
Called patient to reschedule appointment on 3/9. Left message for patient to call office. ?

## 2021-03-23 ENCOUNTER — Ambulatory Visit: Payer: Medicare HMO | Admitting: Physician Assistant

## 2021-03-24 DIAGNOSIS — E113313 Type 2 diabetes mellitus with moderate nonproliferative diabetic retinopathy with macular edema, bilateral: Secondary | ICD-10-CM | POA: Diagnosis not present

## 2021-03-24 DIAGNOSIS — H31093 Other chorioretinal scars, bilateral: Secondary | ICD-10-CM | POA: Diagnosis not present

## 2021-03-24 DIAGNOSIS — H401130 Primary open-angle glaucoma, bilateral, stage unspecified: Secondary | ICD-10-CM | POA: Diagnosis not present

## 2021-03-24 DIAGNOSIS — H35033 Hypertensive retinopathy, bilateral: Secondary | ICD-10-CM | POA: Diagnosis not present

## 2021-03-24 DIAGNOSIS — H35373 Puckering of macula, bilateral: Secondary | ICD-10-CM | POA: Diagnosis not present

## 2021-03-29 NOTE — Progress Notes (Signed)
?Cardiology Office Note:   ? ?Date:  03/30/2021  ? ?ID:  Laura Ferguson, DOB October 31, 1932, MRN [id number] ? ?PCP:  Doreene Nest, NP ?[facility] Cardiologist: Nicki Guadalajara, MD  ? ?Reason for visit: Chest pain ? ?History of Present Illness:   ? ?Laura Ferguson is a 88 y.o. female with a hx of CABG in 2009 (LIMA graft was placed to the LAD, a vein to the intermediate, sequential vein to the OM 1-2, and sequential vein to the acute margin the distal RCA), hypertension, hyperlipidemia, diabetes. ? ?She last saw Dr. Tresa Endo on January 23, 2021 with no changes to treatment.  He mentioned a history of atypical chest pain.  She had a low risk stress test in 2017. ? ?Today, she is very emotional.  She lost her last son 1 week ago.  She states she had 8 children with 2 daughters still living.  She lives independently.  She does all her own chores and grocery shopping.  She is tries to stay active and parks far away at [facility] to get more steps and.  She mentions some epigastric she thinks is related to reflux and mentions some left scapular pain.  She denies exertional symptoms.  She denies PND, orthopnea, lower extremity edema, significant lightheadedness and syncope. ? ?She mentions that she has been very to herself.  She does not trust people.  She does not want to feel like a burden to anyone else.  She states her PCP has talked to her about starting an antidepressant but she is nervous about what it might do to her. ? ? ?  ?Past Medical History:  ?Diagnosis Date  ? CAD (coronary artery disease)   ? History of nuclear stress test 11/22/2011  ? bruce myoview; normal pattern of perfusio; post-stress EF 76%; low risk scan  ? Hyperlipidemia   ? Hypertension   ? 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 LIMA to LAD, SVG to ramus intermedius, SVG to OM1 & OM2, SVG to acute marginal, SVG to distal RCA  ? Type 2 diabetes mellitus (HCC)   ? ? ?Past Surgical History:  ?Procedure Laterality Date  ? CARDIAC CATHETERIZATION  05/07/2007  ? EF 55%,  focal mild hypocontractility in mid-distal inferior wall & mid posterolateral wall; severe multivessel CAD - susequent CABGx6 (Dr. Bishop Limbo)  ? CORONARY ARTERY BYPASS GRAFT  05/09/2007  ? LIMA to LAD, veing to intermediate; SVG to OM1 & OM2; SVG to acute marginal & distal RCA (Dr. Dorris Fetch)  ? TRANSTHORACIC ECHOCARDIOGRAM  02/13/2010  ? EF =>55%, vigorous contraction EF 65%; LA mild-mod dilated; IV normal diameter - normal CVP; trace MR; mild TR; trace AV regurg  ? ? ?Current Medications: ?Current Meds  ?Medication Sig  ? Acetaminophen (TYLENOL 8 HOUR PO) Take 1 tablet by mouth every 8 (eight) hours as needed.   ? aspirin 81 MG tablet Take 81 mg by mouth daily.  ? Azelastine HCl 137 MCG/SPRAY SOLN As needed  ? COVID-19 mRNA bivalent vaccine, Pfizer, (PFIZER COVID-19 VAC BIVALENT) injection Inject into the muscle.  ? glucose blood (ACCU-CHEK GUIDE) test strip USE TO CHECK SUGAR THREE TIMES DAILY AS DIRECTED  ? Insulin Pen Needle (BD PEN NEEDLE NANO 2ND GEN) 32G X 4 MM MISC USE WITH INSULIN PEN 4 TIMES DAILY.  ? LEVEMIR FLEXTOUCH 100 UNIT/ML FlexTouch Pen Inject under skin 24 units in am and 10 units at night  ? losartan (COZAAR) 100 MG tablet Take 1 tablet by mouth once daily  ?  metoprolol succinate (TOPROL-XL) 50 MG 24 hr tablet TAKE 1 TABLET BY MOUTH IN THE MORNING AND 1/2 (ONE-HALF) IN THE EVENING  ? Multiple Vitamin (MULTIVITAMIN) capsule Take 1 capsule by mouth daily.  ? NOVOLOG FLEXPEN 100 UNIT/ML FlexPen Inject 8-12 units 3x a day before meals under skin  ? pantoprazole (PROTONIX) 40 MG tablet Take 1 tablet by mouth once daily  ? Polyethyl Glycol-Propyl Glycol (SYSTANE OP) Apply 2 drops to eye 2 (two) times daily.   ? prednisoLONE acetate (PRED FORTE) 1 % ophthalmic suspension   ? rosuvastatin (CRESTOR) 10 MG tablet Take 1 tablet by mouth once daily  ? TRAVATAN Z 0.004 % SOLN ophthalmic solution Place 1 drop into both eyes at bedtime.  ?  ? ?Allergies:   Patient has no known allergies.  ? ?Social History   ? ?Socioeconomic History  ? Marital status: Widowed  ?  Spouse name: Not on file  ? Number of children: 7  ? Years of education: Not on file  ? Highest education level: Not on file  ?Occupational History  ? Not on file  ?Tobacco Use  ? Smoking status: Never  ? Smokeless tobacco: Never  ? Tobacco comments:  ?  quit 1960's  smoked very lightly.  ?Substance and Sexual Activity  ? Alcohol use: No  ?  Alcohol/week: 0.0 standard drinks  ? Drug use: No  ? Sexual activity: Not on file  ?Other Topics Concern  ? Not on file  ?Social History Narrative  ? Married.  ? Retired.  ? Works as a Engineer, structuralCaregiver.   ? Enjoys helping other.   ? ?Social Determinants of Health  ? ?Financial Resource Strain: Low Risk   ? Difficulty of Paying Living Expenses: Not hard at all  ?Food Insecurity: No Food Insecurity  ? Worried About Programme researcher, broadcasting/film/videounning Out of Food in the Last Year: Never true  ? Ran Out of Food in the Last Year: Never true  ?Transportation Needs: No Transportation Needs  ? Lack of Transportation (Medical): No  ? Lack of Transportation (Non-Medical): No  ?Physical Activity: Inactive  ? Days of Exercise per Week: 0 days  ? Minutes of Exercise per Session: 0 min  ?Stress: No Stress Concern Present  ? Feeling of Stress : Only a little  ?Social Connections: Moderately Isolated  ? Frequency of Communication with Friends and Family: More than three times a week  ? Frequency of Social Gatherings with Friends and Family: More than three times a week  ? Attends Religious Services: More than 4 times per year  ? Active Member of Clubs or Organizations: No  ? Attends BankerClub or Organization Meetings: Never  ? Marital Status: Widowed  ?  ? ?Family History: ?The patient's family history includes Cancer in her child and mother; Diabetes in her sister. ? ?ROS:   ?Please see the history of present illness.    ? ?EKGs/Labs/Other Studies Reviewed:   ? ?EKG:  The ekg ordered today demonstrates normal sinus rhythm with PACs.  Nonspecific T abnormality.  Heart rate  88. ? ?Recent Labs: ?07/14/2020: ALT 21; BUN 25; Creatinine, Ser 1.27; Hemoglobin 12.8; Platelets 180; Potassium 4.6; Sodium 140; TSH 0.565  ? ?Recent Lipid Panel ?Lab Results  ?Component Value Date/Time  ? CHOL 139 07/14/2020 10:19 AM  ? TRIG 84 07/14/2020 10:19 AM  ? HDL 70 07/14/2020 10:19 AM  ? LDLCALC 53 07/14/2020 10:19 AM  ? ? ?Physical Exam:   ? ?VS:  BP 132/84   Pulse 88   Ht  5\' 3"  (1.6 m)   Wt 133 lb 6.4 oz (60.5 kg)   SpO2 97%   BMI 23.63 kg/m?    ?No data found. ? ?Wt Readings from Last 3 Encounters:  ?03/30/21 133 lb 6.4 oz (60.5 kg)  ?02/08/21 135 lb (61.2 kg)  ?01/26/21 135 lb 9.6 oz (61.5 kg)  ?  ? ?GEN:  Well nourished, well developed in no acute distress, elderly appearing ?HEENT: Normal ?NECK: No JVD; No carotid bruits ?CARDIAC: RRR with ectopic beats, no murmurs, rubs, gallops ?RESPIRATORY:  Clear to auscultation without rales, wheezing or rhonchi  ?ABDOMEN: Soft, non-tender, non-distended ?MUSCULOSKELETAL: No edema; No deformity  ?SKIN: Warm and dry ?NEUROLOGIC:  Alert and oriented ?PSYCHIATRIC:  Normal affect  ?  ?ASSESSMENT AND PLAN  ? ?Precordial pain ?-No exertional symptoms; epigastric and left scapular pain ?-Concern exacerbated by stress ?-We talked more about her emotional health today.  Recommend she seek support from any close family or friends as this is a lot of burden to keep to herself.  Recommend discussing more with PCP.  I think she would benefit from counseling. ?-Without exertional symptoms, would prefer monitoring symptoms.  Reassess in 4 months.   ? ?Coronary artery disease ?-hx of CABG 2009 ?-No ischemic changes on EKG ?-Blood pressure and lipids well controlled ?-Continue aspirin, statin and beta-blocker therapy. ? ?Hypertension, well controlled ?-Continue losartan and Toprol. ? ?Hyperlipidemia ?-LDL 53 in June 2022.  Continue Crestor. ? ?Disposition - Follow-up in 4 months with Dr. July 2022. ? ? ?Medication Adjustments/Labs and Tests Ordered: ?Current medicines are  reviewed at length with the patient today.  Concerns regarding medicines are outlined above.  ?Orders Placed This Encounter  ?Procedures  ? EKG 12-Lead  ? ?No orders of the defined types were placed in this encounter. ? ?

## 2021-03-30 ENCOUNTER — Telehealth: Payer: Self-pay

## 2021-03-30 ENCOUNTER — Encounter: Payer: Self-pay | Admitting: Physician Assistant

## 2021-03-30 ENCOUNTER — Ambulatory Visit: Payer: Medicare HMO | Admitting: Physician Assistant

## 2021-03-30 ENCOUNTER — Other Ambulatory Visit: Payer: Self-pay

## 2021-03-30 VITALS — BP 132/84 | HR 88 | Ht 63.0 in | Wt 133.4 lb

## 2021-03-30 DIAGNOSIS — Z951 Presence of aortocoronary bypass graft: Secondary | ICD-10-CM

## 2021-03-30 DIAGNOSIS — R072 Precordial pain: Secondary | ICD-10-CM

## 2021-03-30 DIAGNOSIS — I10 Essential (primary) hypertension: Secondary | ICD-10-CM

## 2021-03-30 DIAGNOSIS — I2510 Atherosclerotic heart disease of native coronary artery without angina pectoris: Secondary | ICD-10-CM

## 2021-03-30 DIAGNOSIS — E785 Hyperlipidemia, unspecified: Secondary | ICD-10-CM

## 2021-03-30 NOTE — Telephone Encounter (Signed)
Patient called to get in with Laura Ferguson since she has not seen her in a while and due to issues she has been having-see chart notes from cardiologist from todays visit in [facility]. There were no openings until 04/19/21, can patient be worked in another day sooner? Please let patient know. ?

## 2021-03-30 NOTE — Patient Instructions (Addendum)
Medication Instructions:  ?No Changes ?*If you need a refill on your cardiac medications before your next appointment, please call your pharmacy* ? ? ?Lab Work: ?No Labs ?If you have labs (blood work) drawn today and your tests are completely normal, you will receive your results only by: ?MyChart Message (if you have MyChart) OR ?A paper copy in the mail ?If you have any lab test that is abnormal or we need to change your treatment, we will call you to review the results. ? ? ?Testing/Procedures: ?No Testing ? ? ?Follow-Up: ?At [facility], you and your health needs are our priority.  As part of our continuing mission to provide you with exceptional heart care, we have created designated Provider Care Teams.  These Care Teams include your primary Cardiologist (physician) and Advanced Practice Providers (APPs -  Physician Assistants and Nurse Practitioners) who all work together to provide you with the care you need, when you need it. ? ?We recommend signing up for the patient portal called "MyChart".  Sign up information is provided on this After Visit Summary.  MyChart is used to connect with patients for Virtual Visits (Telemedicine).  Patients are able to view lab/test results, encounter notes, upcoming appointments, etc.  Non-urgent messages can be sent to your provider as well.   ?To learn more about what you can do with MyChart, go to [email or web address].   ? ?Your next appointment:   ?3-4 month  ? ?The format for your next appointment:   ?In Person ? ?Provider:   ?Nicki Guadalajara, MD   ? ? ?Other Instructions ?Please consider discuss counseling services with Primary Care Provider.  ?

## 2021-03-31 NOTE — Telephone Encounter (Signed)
Patient informed will call if any questions.  

## 2021-03-31 NOTE — Telephone Encounter (Signed)
I am looking at my schedule now and see an opening on 04/06/2021 at 10:20 AM.  Will this work? ?

## 2021-04-05 DIAGNOSIS — Z0100 Encounter for examination of eyes and vision without abnormal findings: Secondary | ICD-10-CM | POA: Diagnosis not present

## 2021-04-06 ENCOUNTER — Ambulatory Visit (INDEPENDENT_AMBULATORY_CARE_PROVIDER_SITE_OTHER): Payer: Medicare HMO | Admitting: Primary Care

## 2021-04-06 ENCOUNTER — Other Ambulatory Visit: Payer: Self-pay

## 2021-04-06 ENCOUNTER — Encounter: Payer: Self-pay | Admitting: Primary Care

## 2021-04-06 DIAGNOSIS — M545 Low back pain, unspecified: Secondary | ICD-10-CM | POA: Diagnosis not present

## 2021-04-06 DIAGNOSIS — I2510 Atherosclerotic heart disease of native coronary artery without angina pectoris: Secondary | ICD-10-CM

## 2021-04-06 DIAGNOSIS — G8929 Other chronic pain: Secondary | ICD-10-CM

## 2021-04-06 DIAGNOSIS — E785 Hyperlipidemia, unspecified: Secondary | ICD-10-CM

## 2021-04-06 DIAGNOSIS — Z794 Long term (current) use of insulin: Secondary | ICD-10-CM

## 2021-04-06 DIAGNOSIS — I10 Essential (primary) hypertension: Secondary | ICD-10-CM | POA: Diagnosis not present

## 2021-04-06 DIAGNOSIS — F32A Depression, unspecified: Secondary | ICD-10-CM | POA: Diagnosis not present

## 2021-04-06 DIAGNOSIS — M791 Myalgia, unspecified site: Secondary | ICD-10-CM

## 2021-04-06 DIAGNOSIS — E1159 Type 2 diabetes mellitus with other circulatory complications: Secondary | ICD-10-CM | POA: Diagnosis not present

## 2021-04-06 DIAGNOSIS — F419 Anxiety disorder, unspecified: Secondary | ICD-10-CM

## 2021-04-06 LAB — COMPREHENSIVE METABOLIC PANEL
ALT: 15 U/L (ref 0–35)
AST: 17 U/L (ref 0–37)
Albumin: 4.1 g/dL (ref 3.5–5.2)
Alkaline Phosphatase: 91 U/L (ref 39–117)
BUN: 23 mg/dL (ref 6–23)
CO2: 26 mEq/L (ref 19–32)
Calcium: 9.3 mg/dL (ref 8.4–10.5)
Chloride: 108 mEq/L (ref 96–112)
Creatinine, Ser: 1.42 mg/dL — ABNORMAL HIGH (ref 0.40–1.20)
GFR: 33.42 mL/min — ABNORMAL LOW (ref >60.00)
Glucose, Bld: 220 mg/dL — ABNORMAL HIGH (ref 70–99)
Potassium: 4.1 mEq/L (ref 3.5–5.1)
Sodium: 141 mEq/L (ref 135–145)
Total Bilirubin: 0.6 mg/dL (ref 0.2–1.2)
Total Protein: 7 g/dL (ref 6.0–8.3)

## 2021-04-06 LAB — LIPID PANEL
Cholesterol: 194 mg/dL (ref 0–200)
HDL: 65.8 mg/dL (ref >39.00)
LDL Cholesterol: 104 mg/dL — ABNORMAL HIGH (ref 0–99)
NonHDL: 127.76
Total CHOL/HDL Ratio: 3
Triglycerides: 121 mg/dL (ref 0.0–149.0)
VLDL: 24.2 mg/dL (ref 0.0–40.0)

## 2021-04-06 NOTE — Assessment & Plan Note (Signed)
Not on statin therapy. ?Repeat lipid panel pending. ? ?Will resume some form of cholesterol lowering medication.  ?

## 2021-04-06 NOTE — Assessment & Plan Note (Signed)
Improving. ?Following with endocrinology. ? ?Continue Levemir 24 units in the morning, 10 units at night. ?Continue NovoLog 8 to 12 units 3 times daily with meals. ? ? ?

## 2021-04-06 NOTE — Progress Notes (Signed)
Subjective:    Patient ID: Laura Ferguson, female    DOB: 1932-03-19, 88 y.o.   MRN: [id number]  HPI  Laura Ferguson is a very pleasant 88 y.o. female with a history of CAD, hypertension, type 2 diabetes, hyperlipidemia, GAD, chronic back pain, fatigue who presents today to discuss depression and for follow up of chronic conditions.  1) Depression and Anxiety: Long history of depression and anxiety which have been discussed on multiple prior visits with PCP.  Patient has been offered treatment for symptoms for years, was prescribed sertraline but never took as she was concerned about potential side effects.  Evaluated by cardiology on 03/30/2021, during this appointment she mentioned having a hard time trusting people, wanting to be alone, does not want to be a burden to anyone else. She endorsed losing her last son one week prior.   Today she endorses struggling with depression, especially since losing her latest son. She has 2 living children out of 8 total children. Symptoms include feeling down/depressed, not wanting to go anywhere or do anything. She is not seeing therapy. She doesn't want to take antidepressant.   2) Type 2 Diabetes: Currently following with endocrinology, A1c of 8.1 in January 2023.  She is managed on Levemir 24 units in the morning and 10 units at night, NovoLog 8 to 12 units 3 times daily with meals.  3) CAD/CABG/Hypertension/Hyperlipidemia: Following with cardiology, last visit was 1 week ago.  During this visit she endorsed precordial pain which was suspected to be secondary to stress/emotional health.  No changes in medication were recommended.  Currently managed on metoprolol succinate 50 mg daily, rosuvastatin 10 mg daily, losartan 100 mg daily.  BP Readings from Last 3 Encounters:  04/06/21 136/82  03/30/21 132/84  01/26/21 128/72   4) Chronic Back Pain: Chronic to the right lower back and hip, worse in the morning, sometimes aggravated with walking. Occasional  radiation of pain down her right lower extremity. She's been taking Tylenol with improvement in pain. She underwent outpatient PT last year for her pain, isn't sure if PT helped.   She took Tylenol this morning and has no back pain now.   She is not taking rosuvastatin, stopped it last year due to myalgias, never resumed. She believes her lower extremity pain improved once she stopped rosuvastatin.   Review of Systems  Respiratory:  Negative for shortness of breath.   Cardiovascular:  Negative for chest pain.  Musculoskeletal:  Positive for arthralgias and back pain.  Neurological:  Negative for numbness.        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  Substance and Sexual Activity   Alcohol use: No    Alcohol/week: 0.0 standard drinks   Drug use: No   Sexual activity: Not on file  Other Topics Concern   Not on file  Social History Narrative   Married.   Retired.   Works as a Engineer, structural.    Enjoys helping other.    Social Determinants of Health   Financial Resource Strain: Low Risk    Difficulty of Paying Living Expenses: Not hard at all  Food Insecurity: No Food Insecurity   Worried About Programme researcher, broadcasting/film/video in the Last Year: Never true   Ran Out of Food in the Last Year:  Never true  Transportation Needs: No Transportation Needs   Lack of Transportation (Medical): No   Lack of Transportation (Non-Medical): No  Physical Activity: Inactive   Days of Exercise per Week: 0 days   Minutes of Exercise per Session: 0 min  Stress: No Stress Concern Present   Feeling of Stress : Only a little  Social Connections: Moderately Isolated   Frequency of Communication with Friends and Family: More than three times a week   Frequency of Social Gatherings with Friends and Family: More than three times a week   Attends Religious Services: More than 4 times per year   Active Member of [employer] or Organizations: No   Attends Banker Meetings: Never   Marital Status: Widowed  Catering manager Violence: Not At Risk   Fear of Current or Ex-Partner: No   Emotionally Abused: No   Physically Abused: No   Sexually Abused: No    Past Surgical History:  Procedure Laterality Date   CARDIAC CATHETERIZATION  05/07/2007   EF 55%, focal mild hypocontractility in mid-distal inferior wall & mid posterolateral wall; severe multivessel CAD - susequent CABGx6 (Dr. Bishop Limbo)   CORONARY ARTERY BYPASS GRAFT  05/09/2007   LIMA to LAD, veing to intermediate; SVG to OM1 & OM2; SVG to acute marginal & distal RCA (Dr. Dorris Fetch)   TRANSTHORACIC ECHOCARDIOGRAM  02/13/2010   EF =>55%, vigorous contraction EF 65%; LA mild-mod dilated; IV normal diameter - normal CVP; trace MR; mild TR; trace AV regurg    Family History  Problem Relation Age of Onset   Cancer Mother    Diabetes Sister    Cancer Child     No Known Allergies  Current Outpatient Medications on File Prior to Visit  Medication Sig Dispense Refill   Acetaminophen (TYLENOL 8 HOUR PO) Take 1 tablet by mouth every 8 (eight) hours as needed.      aspirin 81 MG tablet Take 81 mg by mouth daily.     Azelastine HCl 137 MCG/SPRAY SOLN As needed     glucose blood (ACCU-CHEK GUIDE) test strip USE TO CHECK SUGAR THREE TIMES DAILY  AS DIRECTED 250 each 3   Insulin Pen Needle (BD PEN NEEDLE NANO 2ND GEN) 32G X 4 MM MISC USE WITH INSULIN PEN 4 TIMES DAILY. 300 each 3   LEVEMIR FLEXTOUCH 100 UNIT/ML FlexTouch Pen Inject under skin 24 units in am and 10 units at night 30 mL 3   losartan (COZAAR) 100 MG tablet Take 1 tablet by mouth once daily 90 tablet 3   metoprolol succinate (TOPROL-XL) 50 MG 24 hr tablet TAKE 1 TABLET BY MOUTH IN THE MORNING AND 1/2 (ONE-HALF) IN THE EVENING 135 tablet 1   Multiple Vitamin (MULTIVITAMIN) capsule Take 1 capsule by mouth daily.     NOVOLOG FLEXPEN  100 UNIT/ML FlexPen Inject 8-12 units 3x a day before meals under skin 30 mL 3   pantoprazole (PROTONIX) 40 MG tablet Take 1 tablet by mouth once daily 90 tablet 3   Polyethyl Glycol-Propyl Glycol (SYSTANE OP) Apply 2 drops to eye 2 (two) times daily.      prednisoLONE acetate (PRED FORTE) 1 % ophthalmic suspension      TRAVATAN Z 0.004 % SOLN ophthalmic solution Place 1 drop into both eyes at bedtime.     rosuvastatin (CRESTOR) 10 MG tablet Take 1 tablet by mouth once daily (Patient not taking: Reported on 04/06/2021) 90 tablet 3   No current facility-administered medications on file prior to visit.    BP 136/82   Pulse 79   Temp 98.6 F (37 C) (Oral)   Ht 5\' 3"  (1.6 m)   Wt 133 lb (60.3 kg)   SpO2 98%   BMI 23.56 kg/m  Objective:   Physical Exam Cardiovascular:     Rate and Rhythm: Normal rate and regular rhythm.  Pulmonary:     Effort: Pulmonary effort is normal.     Breath sounds: Normal breath sounds.  Musculoskeletal:     Cervical back: Neck supple.     Lumbar back: No tenderness or bony tenderness. Normal range of motion. Negative right straight leg raise test and negative left straight leg raise test.       Back:  Skin:    General: Skin is warm and dry.  Psychiatric:     Comments: Tearful at times during visit          Assessment & Plan:      This visit occurred during the SARS-CoV-2 public health emergency.   Safety protocols were in place, including screening questions prior to the visit, additional usage of staff PPE, and extensive cleaning of exam room while observing appropriate contact time as indicated for disinfecting solutions.

## 2021-04-06 NOTE — Assessment & Plan Note (Signed)
Chronic, uncontrolled. ? ?Sympathy provided today given the loss of her son.  Strongly advised we treat her symptoms, she again declines antidepressant medication. ? ?She agrees to therapy, referral placed. ? ?I asked her to seriously consider antidepressant medication treatment, she will notify if she changes her mind. ?

## 2021-04-06 NOTE — Patient Instructions (Signed)
Stop by the lab prior to leaving today. I will notify you of your results once received.  ? ?You will be contacted regarding your referral to therapy.  Please let us know if you have not been contacted within two weeks.  ? ?Please notify me if you change your mind regarding medication treatment for depression. ? ?It was a pleasure to see you today! ? ?

## 2021-04-06 NOTE — Assessment & Plan Note (Signed)
Controlled. ? ?Continue metoprolol succinate 50 mg daily, losartan 100 mg daily. ? ?CMP pending. ?

## 2021-04-06 NOTE — Assessment & Plan Note (Signed)
Improved. ? ?Patient never updated regarding her symptoms as instructed. She has been without rosuvastatin for nearly 1 year.  ? ?Repeat lipid panel pending today. ? ?Discussed today that she will need some form of cholesterol lowering therapy given her cardiac history.  ?

## 2021-04-06 NOTE — Assessment & Plan Note (Signed)
Suspect arthritis.  ?No alarm signs on exam. ? ?She declines PT. ? ?Continue Tylenol as needed. ?

## 2021-04-06 NOTE — Assessment & Plan Note (Addendum)
Not managed on statin therapy, patient never followed up regarding myalgias symptoms from July 2022. ? ?Repeat lipid panel pending. ?Discussed that she would need some form of cholesterol-lowering treatment given her history of CAD. ? ?Consider pravastatin or lower dose of rosuvastatin if applicable ?

## 2021-04-07 ENCOUNTER — Telehealth: Payer: Self-pay

## 2021-04-07 DIAGNOSIS — E785 Hyperlipidemia, unspecified: Secondary | ICD-10-CM

## 2021-04-07 MED ORDER — ROSUVASTATIN CALCIUM 10 MG PO TABS: 10.0000 mg | ORAL_TABLET | Freq: Every day | ORAL | 3 refills | Status: DC

## 2021-04-07 NOTE — Addendum Note (Signed)
Addended by: Pleas Koch on: 04/07/2021 02:15 PM ? ? Modules accepted: Orders ? ?

## 2021-04-07 NOTE — Telephone Encounter (Signed)
-----   Message from Doreene Nest, NP sent at 04/06/2021  4:35 PM EDT ----- ?Please call patient: ? ?Kidney function is stable. ?Electrolytes, liver looks good. ? ?Cholesterol is too high so I recommend we resume her cholesterol-lowering medication.  ? ?If she did not notice much improvement in her joint aches when we held her rosuvastatin then I recommend we resume this medication. ? ?If she did notice improvement in her joint aches when we held her rosuvastatin that I recommend we try pravastatin. ? ?Let me know.  She will also need repeat lipid panel in 3 months, lab only appointment is fine. ?

## 2021-04-07 NOTE — Telephone Encounter (Signed)
This is not what she mentioned to me during our visit.  ? ?She plainly stated that she didn't take rosuvastatin as we held it temporarily to see if it helped with joint aches. She never followed up 2 weeks as discussed.  ? ?Will refill rosuvastatin 10 mg daily.  ?

## 2021-04-07 NOTE — Telephone Encounter (Signed)
Patient called back she was upset states that there is confusion about her medications. She wanted to have rosuvastatin 10 mg called in. States that she never had issues with it in the past but her cholesterol got low so she stopped it.  ? ?[facility]  ?

## 2021-04-07 NOTE — Telephone Encounter (Signed)
Called lvm for pt to call us back regarding lab results  ?

## 2021-04-19 ENCOUNTER — Ambulatory Visit: Payer: Medicare HMO | Admitting: Primary Care

## 2021-04-24 ENCOUNTER — Other Ambulatory Visit: Payer: Self-pay | Admitting: Cardiovascular Disease

## 2021-05-01 DIAGNOSIS — E113313 Type 2 diabetes mellitus with moderate nonproliferative diabetic retinopathy with macular edema, bilateral: Secondary | ICD-10-CM | POA: Diagnosis not present

## 2021-05-01 DIAGNOSIS — E113311 Type 2 diabetes mellitus with moderate nonproliferative diabetic retinopathy with macular edema, right eye: Secondary | ICD-10-CM | POA: Diagnosis not present

## 2021-05-01 DIAGNOSIS — H35373 Puckering of macula, bilateral: Secondary | ICD-10-CM | POA: Diagnosis not present

## 2021-05-02 NOTE — Telephone Encounter (Signed)
Error

## 2021-05-29 DIAGNOSIS — H35372 Puckering of macula, left eye: Secondary | ICD-10-CM | POA: Diagnosis not present

## 2021-05-29 DIAGNOSIS — E113313 Type 2 diabetes mellitus with moderate nonproliferative diabetic retinopathy with macular edema, bilateral: Secondary | ICD-10-CM | POA: Diagnosis not present

## 2021-06-01 ENCOUNTER — Ambulatory Visit (INDEPENDENT_AMBULATORY_CARE_PROVIDER_SITE_OTHER): Payer: Medicare HMO | Admitting: Internal Medicine

## 2021-06-01 ENCOUNTER — Encounter: Payer: Self-pay | Admitting: Internal Medicine

## 2021-06-01 VITALS — BP 122/70 | HR 88 | Ht 63.0 in | Wt 131.6 lb

## 2021-06-01 DIAGNOSIS — E785 Hyperlipidemia, unspecified: Secondary | ICD-10-CM

## 2021-06-01 DIAGNOSIS — Z794 Long term (current) use of insulin: Secondary | ICD-10-CM | POA: Diagnosis not present

## 2021-06-01 DIAGNOSIS — E1159 Type 2 diabetes mellitus with other circulatory complications: Secondary | ICD-10-CM | POA: Diagnosis not present

## 2021-06-01 LAB — POCT GLYCOSYLATED HEMOGLOBIN (HGB A1C): Hemoglobin A1C: 8.9 % — AB (ref 4.0–5.6)

## 2021-06-01 NOTE — Progress Notes (Signed)
atient ID: Laura Ferguson, female   DOB: 23-Feb-1932, 88 y.o.   MRN: [id number]   This visit occurred during the SARS-CoV-2 public health emergency.  Safety protocols were in place, including screening questions prior to the visit, additional usage of staff PPE, and extensive cleaning of exam room while observing appropriate contact time as indicated for disinfecting solutions.   HPI: Laura Ferguson is a 88 y.o.-year-old female, returning for f/u for DM2, dx in 1990s, insulin-dependent for years, uncontrolled, with complications (CAD - s/p CABG x6, CKD, DR, PN). Last visit 4 months ago.  Interim hx: No increased urination, nausea, chest pain.  She has no complaints at this visit. She has a lot of stress - she just buried her 5th son - all her sons died in last few years. She has 2 daughters. She continues to have blurry vision-gets intraocular injections.  Reviewed HbA1c levels: Lab Results  Component Value Date   HGBA1C 8.1 (A) 01/26/2021   HGBA1C 7.8 (A) 09/22/2020   HGBA1C 8.3 (H) 07/14/2020   HGBA1C 7.9 (A) 05/19/2020   HGBA1C 8.4 (A) 01/20/2020   HGBA1C 8.1 (A) 09/23/2019   HGBA1C 8.5 (A) 06/18/2019   HGBA1C 7.9 (A) 02/17/2019   HGBA1C 8.4 (A) 10/16/2018   HGBA1C 8.8 (A) 06/16/2018   HGBA1C 8.9 (A) 01/29/2018   HGBA1C 9.4 (A) 10/15/2017   HGBA1C 8.5 04/18/2017   HGBA1C 9.1 01/17/2017   HGBA1C 8.8 10/16/2016   HGBA1C 11.7 (H) 04/02/2016   HGBA1C 10.0 (H) 01/03/2016   HGBA1C 9.5 (H) 09/30/2015   HGBA1C 16.2 (H) 06/27/2015   HGBA1C 14.3 (H) 03/21/2015   She is usually not following the recommended regimen despite repeated advice at every visit:  In the past, she was on: - Levemir 30 units in a.m. and 15 units units at bedtime (but skips it if sugars are at goal!!! - takes it maybe 2x a week) - Novolog 12-15 units 15 min before meals (but actually takes 12 units in am and 10 units before lunch and dinner)  Then on: - Levemir 30 units in a.m. and only occasionally, 12 units units  at bedtime - Novolog 12 units 15 min before meals  Then on: - Levemir 30 units in a.m. >> not taking - Novolog 12-15 units 15 min before meals - only using 6-12 units and misses doses  Then on: - Levemir 30 units in a.m. and 10 units at bedtime >> taking 0-10 units - Novolog 12-15 units 15 min before meals >> cannot tell exactly how much she takes but apparently close to: 12 units in am  0 units before lunch 0-12 units before dinner   At last visit: - Levemir 24 >> actually: 22 units in a.m. and 10 >> actually: 8 units at bedtime (EVERY NIGHT) - Novolog 6-12 >> actually: 10-12 units 15 min before meals  Now:  - Levemir: 12-18 units (unclear) in am and 10 units at bedtime  - NovoLog: 12 (b'fast)-0 (no lunch)-6-12 (dinner)  She checks sugars 1-3 times a day:  - am:  123-316 >> 67, 146-243 >> 77-135, 200 >> 183-261, 436 - 2h after b'fast: 71, 96-143, 326 >> 57, 126-251 >> 251 - before lunch: 85-343 >> 64-182, 260 >> 191-242 >> 101-398 - 2h after lunch:  356 >> 194-283 >> 81-252 >> 172-216>> 145-306 - before dinner:  107-349 >> 98-229 >> 60-297 >> 91-356 - 2h after dinner: 60-228 >> 130-161 >> 140-255 >> 261-335 - bedtime: 217, 209 >> n/c >> 80-178  >>  n/c >> 170 - nighttime:  135-268, 312 >> 176, 215 >> 186 >> 160-228 >> n/c Lowest sugar was 60 >> 64 >> 57 >> 60s, 91; it is unclear at which level she has hypoglycemia awareness Highest sugar was 451 ... >> 326 >> 297 >> 436.  Glucometer: AccuChek  Pt's meals are: - Breakfast: oatmeal, grits, apple sauce - Lunch: tuna sandwich, bologna - Dinner: soup, sandwich  -+ CKD, last BUN/creatinine:  Lab Results  Component Value Date   BUN 23 04/06/2021   BUN 25 07/14/2020   CREATININE 1.42 (H) 04/06/2021   CREATININE 1.27 (H) 07/14/2020  On losartan 100.  -+ HL; last set of lipids: Lab Results  Component Value Date   CHOL 194 04/06/2021   HDL 65.80 04/06/2021   LDLCALC 104 (H) 04/06/2021   TRIG 121.0 04/06/2021   CHOLHDL 3  04/06/2021  On Crestor 10.  Cardiologist: Dr. Tresa EndoKelly.   - last eye exam was in 08/2020: + DR, she gets intraocular injections. Dr. Tomie ChinaZane.  - she has numbness and tingling in her feet.  She also has HTN.  2 of her sons died in the 2 weeks in 2021: one of Covid, one unexpectedly!   ROS: + see HPI + Blurry vision Neurological: no tremors/+ numbness/+ tingling/no dizziness  I reviewed pt's medications, allergies, PMH, social hx, family hx, and changes were documented in the history of present illness. Otherwise, unchanged from my initial visit note.  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 Bishop Limbo. [place])   CORONARY ARTERY BYPASS GRAFT  05/09/2007   LIMA to LAD, veing to intermediate; SVG to OM1 & OM2; SVG to acute marginal & distal RCA (Dr. Dorris FetchHendrickson)   TRANSTHORACIC ECHOCARDIOGRAM  02/13/2010   EF =>55%, vigorous contraction EF 65%; LA mild-mod dilated; IV normal diameter - normal CVP; trace MR; mild TR; trace AV regurg   Social History   Social History   Marital status: Widowed    Spouse name: N/A   Number of children: 8   Occupational History   homemaker   Social History Main Topics   Smoking status: Never Smoker   Smokeless tobacco: Never Used     Comment: quit 1960's  smoked very lightly.   Alcohol use No   Drug use: No     Social History Narrative   Married.   Retired.   Works as a Engineer, structuralCaregiver.     Enjoys helping other.    Current Outpatient Medications on File Prior to Visit  Medication Sig Dispense Refill   Acetaminophen (TYLENOL 8 HOUR PO) Take 1 tablet by mouth every 8 (eight) hours as needed.      aspirin 81 MG tablet Take 81 mg by mouth daily.     Azelastine HCl 137 MCG/SPRAY SOLN As needed     glucose blood (ACCU-CHEK GUIDE) test strip USE TO CHECK SUGAR  THREE TIMES DAILY AS DIRECTED 250 each 3   Insulin Pen Needle (BD PEN NEEDLE NANO 2ND GEN) 32G X 4 MM MISC USE WITH INSULIN PEN 4 TIMES DAILY. 300 each 3   LEVEMIR FLEXTOUCH 100 UNIT/ML FlexTouch Pen Inject under skin 24 units in am and 10 units at night 30 mL 3   losartan (COZAAR) 100 MG tablet Take 1 tablet by mouth once daily 90 tablet 3   metoprolol succinate (TOPROL-XL) 50 MG 24 hr tablet TAKE 1 TABLET BY MOUTH IN THE MORNING AND 1/2 (ONE-HALF) IN THE EVENING 135 tablet 1   Multiple Vitamin (MULTIVITAMIN) capsule Take 1 capsule by mouth daily.     NOVOLOG FLEXPEN 100 UNIT/ML FlexPen Inject 8-12 units 3x a day before meals under skin 30 mL 3   pantoprazole (PROTONIX) 40 MG tablet Take 1 tablet by mouth once daily 90 tablet 1   Polyethyl Glycol-Propyl Glycol (SYSTANE OP) Apply 2 drops to eye 2 (two) times daily.      prednisoLONE acetate (PRED FORTE) 1 % ophthalmic suspension      rosuvastatin (CRESTOR) 10 MG tablet Take 1 tablet (10 mg total) by mouth daily. for cholesterol. 90 tablet 3   TRAVATAN Z 0.004 % SOLN ophthalmic solution Place 1 drop into both eyes at bedtime.     No current facility-administered medications on file prior to visit.   No Known Allergies   Family History  Problem Relation Age of Onset   Cancer Mother    Diabetes Sister    Cancer Child    Pt has FH of DM in [place], sister - died from DM complications (refused to start HD), sons - 1 son died from DM complications.  Her youngest son died 02-02-17 (cancer). He was 53.  PE: BP 122/70 (BP Location: Right Arm, Patient Position: Sitting, Cuff Size:  Normal)   Pulse 88   Ht 5\' 3"  (1.6 m)   Wt 131 lb 9.6 oz (59.7 kg)   SpO2 99%   BMI 23.31 kg/m  Wt Readings from Last 3 Encounters:  06/01/21 131 lb 9.6 oz (59.7 kg)  04/06/21 133 lb (60.3 kg)  03/30/21 133 lb 6.4 oz (60.5 kg)   Constitutional: normal weight, in NAD Eyes: PERRLA, EOMI, no exophthalmos ENT: moist mucous membranes, no thyromegaly, no cervical lymphadenopathy Cardiovascular: RRR, No MRG Respiratory: CTA B Musculoskeletal: no deformities, strength intact in all 4 Skin: moist, warm, no rashes Neurological: no tremor with outstretched hands, DTR normal in all 4 Diabetic Foot Exam - Simple   Simple Foot Form Diabetic Foot exam was performed with the following findings: Yes 06/01/2021 10:18 AM  Visual Inspection No deformities, no ulcerations, no other skin breakdown bilaterally: Yes Sensation Testing Intact to touch and monofilament testing bilaterally: Yes Pulse Check Posterior Tibialis and Dorsalis pulse intact bilaterally: Yes Comments     ASSESSMENT: 1. DM2, insulin-dependent, uncontrolled, with complications - CAD - s/p CABG x6 - CKD - DR - PN  2. HL  3. PN  PLAN:  1. Patient with longstanding, uncontrolled, type 2 diabetes, on basal/bolus insulin regimen, with previous deterioration of her diabetes control after her sons' deaths.  However, the major hurdle in her diabetes control is that she is not following the recommended regimen, missing doses of insulin, skipping NovoLog, and taking different doses of insulin than recommended at every visit.  We discussed repeatedly in the past about the importance of taking NovoLog before each meal and to try to at least start with the recommended  doses and adjust the doses rationally, based on the observed patterns.  At last visit, sugars were still quite fluctuating, higher after breakfast and lunch.  It was slightly better after the holidays.  Upon questioning, she was not quite sure about the insulin doses that she  was taking.  However, per her recall, she was at least taking doses that were close to the ones that I recommended.  I did not suggest a change in regimen.  She was doing a good job taking her blood sugars frequently.  HbA1c was higher, at 8.1%. -At today's visit, sugars are much higher.  However, she has had significant stress with her fifth son dying in the last month.  This was unexpected.  Upon questioning, however, she is still not taking the regimen as prescribed.  She is taking a low-dose of Levemir in the morning but she seems to not remember exactly how much.  At one point she mentions that she takes 12 units in the morning, but when asked later, she mentions 18 units.  These are both lower than the recommended dose of 22 units.  She is also very vague about the dose of NovoLog that she is taking.  I do suspect that patient has memory deficit and I am not sure if we can get her to take the regimen as prescribed.  For now, we will continue the same dose of NovoLog.  I advised her to always take it before meals. - I suggested to:  Patient Instructions  Please increase: - Levemir 22 units in a.m. and 10 units at bedtime  - Novolog 6-12 units 15 min before meals  DO NOT CORRECT the blood sugars after meals.  Please return in 6 months with your sugar log.  - we checked her HbA1c: 8.9% (higher) - advised to check sugars at different times of the day - 4x a day, rotating check times - advised for yearly eye exams >> she is UTD - foot exam performed today - return to clinic in 6 months  2. HL -Reviewed latest lipid panel from 03/2021: LDL above our goal of less than 55 due to cardiovascular disease, the rest of fractions at goal: Lab Results  Component Value Date   CHOL 194 04/06/2021   HDL 65.80 04/06/2021   LDLCALC 104 (H) 04/06/2021   TRIG 121.0 04/06/2021   CHOLHDL 3 04/06/2021  -She continues on Crestor 10 mg daily without side effects.  We will continue this dose for now, due to  age.  Carlus Pavlov, MD PhD [facility] Endocrinology

## 2021-06-01 NOTE — Patient Instructions (Addendum)
Please increase: - Levemir 22 units in a.m. and 10 units at bedtime  - Novolog 6-12 units 15 min before meals  DO NOT CORRECT the blood sugars after meals.  Please return in 6 months with your sugar log.

## 2021-06-02 IMAGING — DX DG FOOT COMPLETE 3+V*L*
3 series · 3 of 3 positions shown · non-contrast
Comparison: Ankle evaluation of the same date.

CLINICAL DATA: Acute foot pain status post fall

EXAM:
LEFT FOOT - COMPLETE 3+ VIEW

[foot lat]
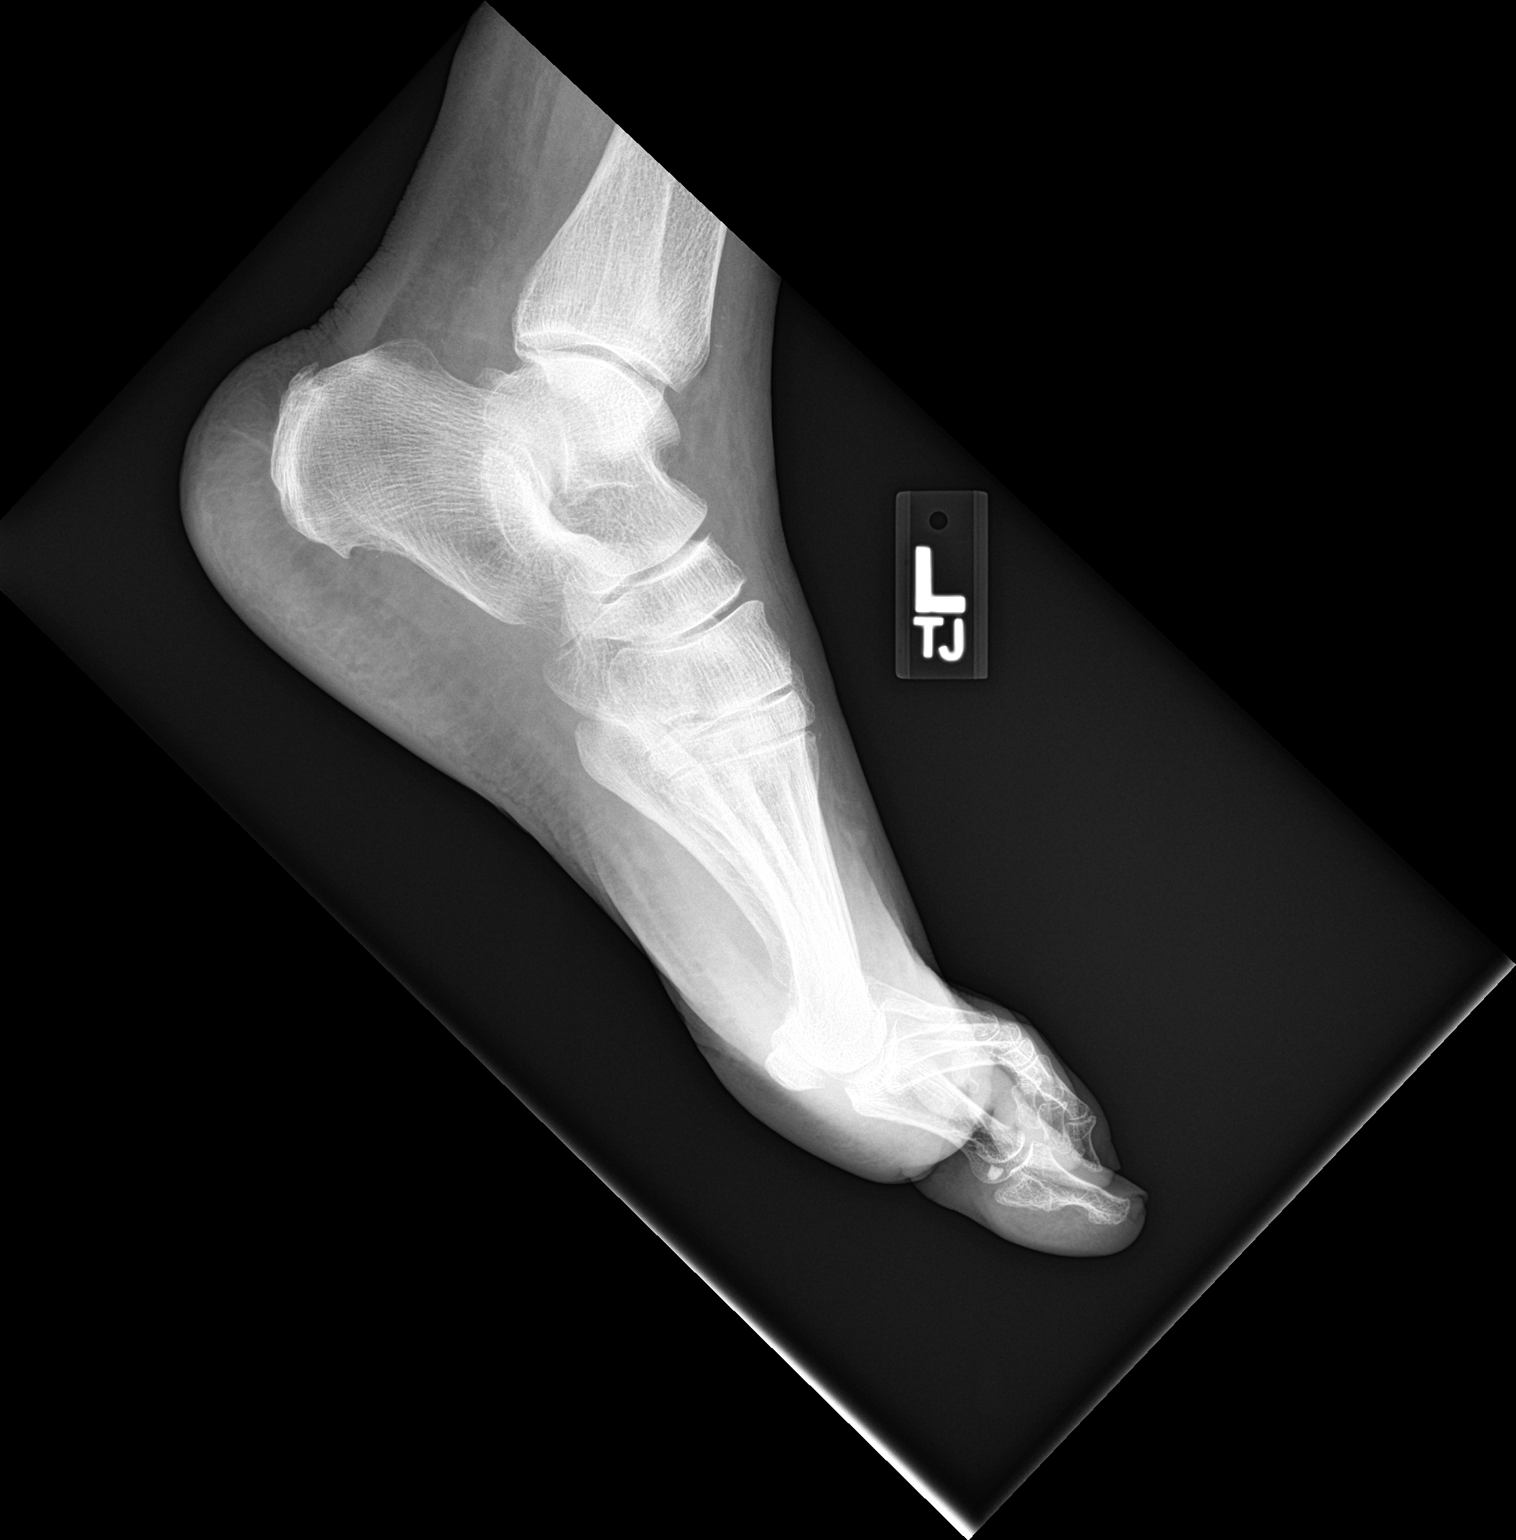

[foot obl]
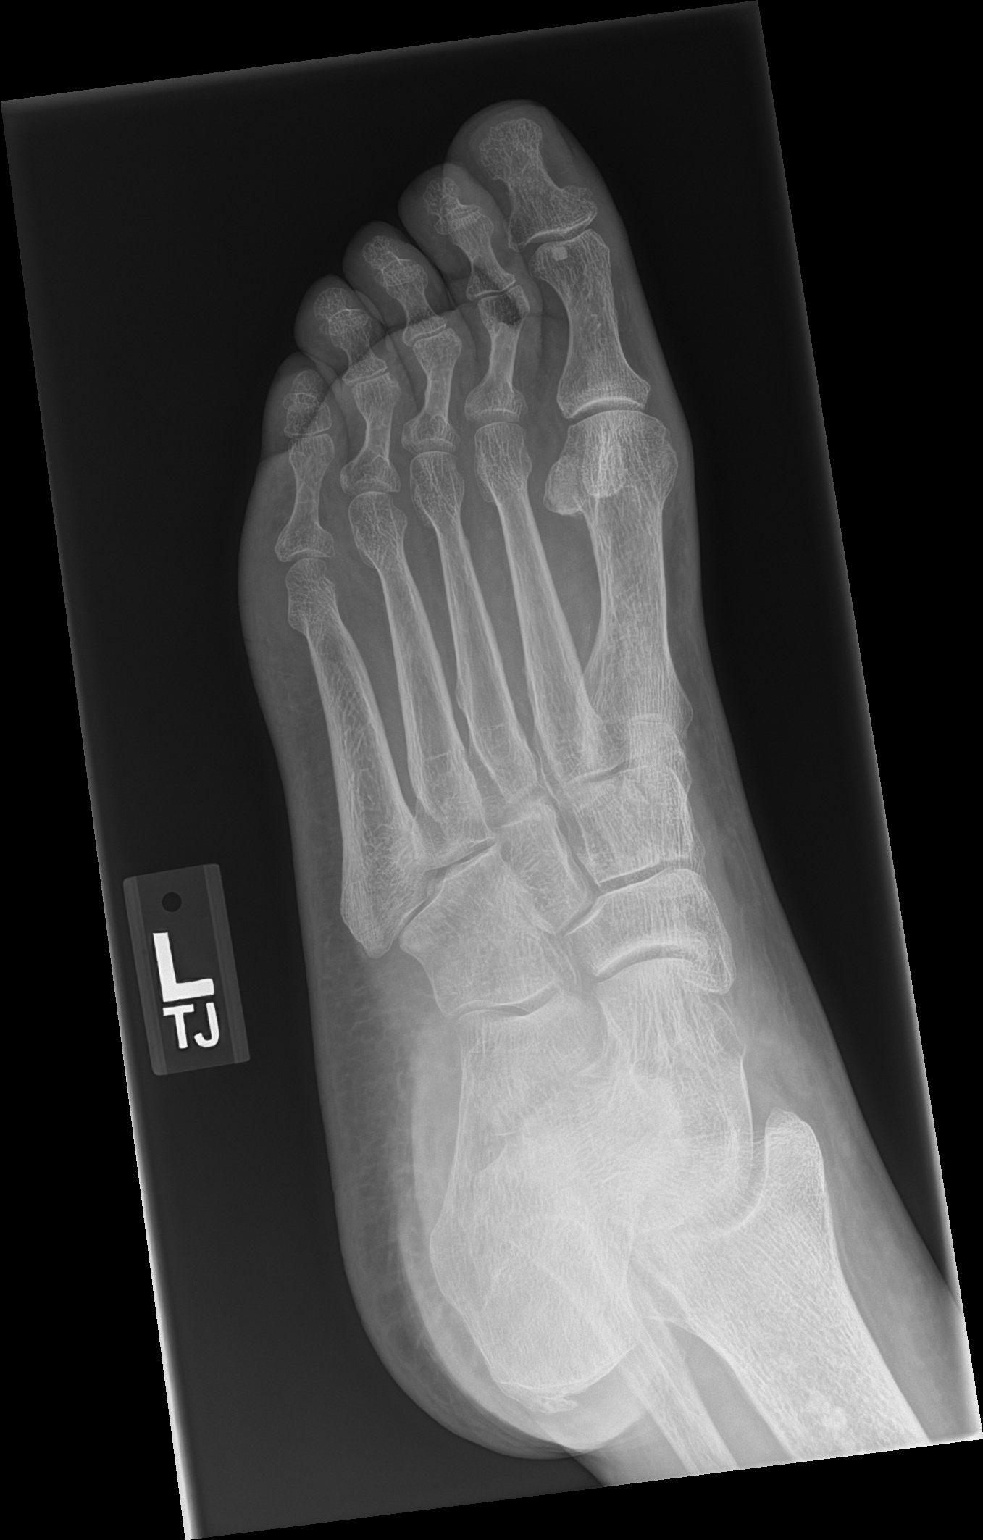

[foot ap]
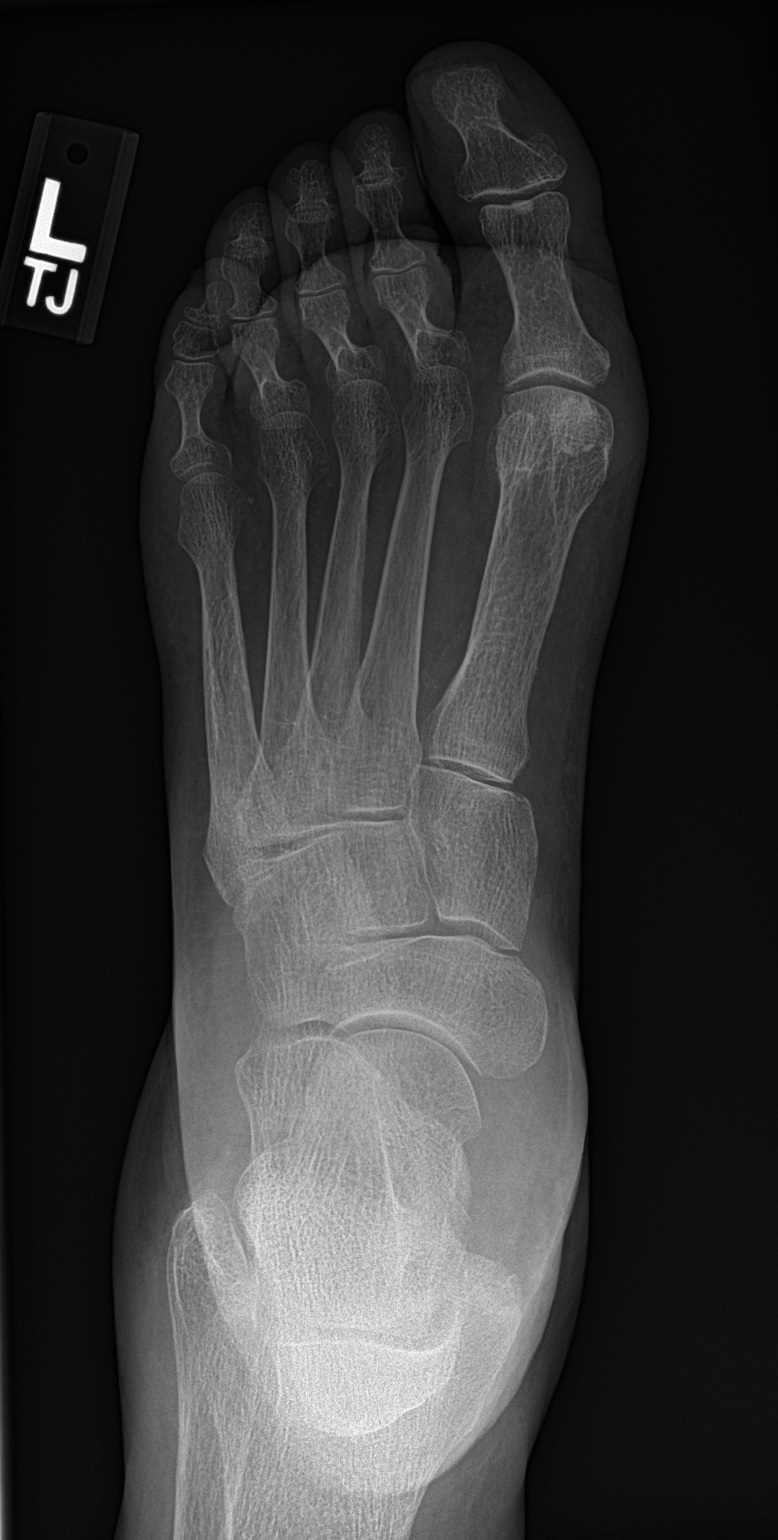

[3 of 3 positions shown; findings below may reference images not displayed]

FINDINGS: No signs of acute fracture or dislocation. No significant soft
tissue swelling. Mild degenerative changes in the midfoot and
enthesopathy upon the calcaneus. Sesamoid bone about the great toe
interphalangeal joint.
IMPRESSION: No signs of acute fracture or dislocation.

## 2021-06-06 ENCOUNTER — Other Ambulatory Visit: Payer: Self-pay

## 2021-06-06 DIAGNOSIS — Z794 Long term (current) use of insulin: Secondary | ICD-10-CM

## 2021-06-06 MED ORDER — ACCU-CHEK GUIDE W/DEVICE KIT: PACK | 0 refills | Status: AC

## 2021-06-08 DIAGNOSIS — Z0100 Encounter for examination of eyes and vision without abnormal findings: Secondary | ICD-10-CM | POA: Diagnosis not present

## 2021-06-09 ENCOUNTER — Ambulatory Visit (INDEPENDENT_AMBULATORY_CARE_PROVIDER_SITE_OTHER)
Admission: RE | Admit: 2021-06-09 | Discharge: 2021-06-09 | Disposition: A | Discharge status: Home / Self Care | Payer: Medicare HMO | Source: Ambulatory Visit | Attending: Primary Care | Admitting: Primary Care

## 2021-06-09 ENCOUNTER — Encounter: Payer: Self-pay | Admitting: Primary Care

## 2021-06-09 ENCOUNTER — Ambulatory Visit: Payer: Medicare HMO | Admitting: Primary Care

## 2021-06-09 VITALS — BP 126/70 | HR 100 | Ht 63.0 in | Wt 131.4 lb

## 2021-06-09 DIAGNOSIS — M545 Low back pain, unspecified: Secondary | ICD-10-CM

## 2021-06-09 DIAGNOSIS — G8929 Other chronic pain: Secondary | ICD-10-CM | POA: Diagnosis not present

## 2021-06-09 DIAGNOSIS — R300 Dysuria: Secondary | ICD-10-CM

## 2021-06-09 LAB — POC URINALSYSI DIPSTICK (AUTOMATED)
Bilirubin, UA: NEGATIVE
Blood, UA: NEGATIVE
Glucose, UA: NEGATIVE
Ketones, UA: NEGATIVE
Leukocytes, UA: NEGATIVE
Nitrite, UA: NEGATIVE
Protein, UA: POSITIVE — AB
Spec Grav, UA: 1.015 (ref 1.010–1.025)
Urobilinogen, UA: 0.2 E.U./dL
pH, UA: 6 (ref 5.0–8.0)

## 2021-06-09 NOTE — Assessment & Plan Note (Addendum)
Acute on chronic flare for which seems to be a result of increased walking and activity from her granddaughters graduation. 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   Exam today with reproducible pain with ROM exercises. No alarm signs.   UA today negative.   Suspect MSK etiology for right hip/pelvic pain.  Discussed stretching, Tylenol. Limit NSAID's given CKD.  Offered PT for which she kindly declines.  Xrays of lumbar spine pending.

## 2021-06-09 NOTE — Progress Notes (Signed)
Subjective:    Patient ID: Laura Ferguson, female    DOB: [birth year], 88 y.o.   MRN: [id number]  HPI  Laura Ferguson is a very pleasant 88 y.o. female with a history of CAD, hypertension, diabetes, anxiety depression, chronic back pain, fatigue who presents today to discuss chronic back pain.   Her pain is located to the right lower back which has been chronic for years. Over the last month she's noticed radiation of her pain to her right hip and right pelvis which is intermittent. Pain is worse with increased activity, improved with rest. She attended her granddaughter's graduation 1 month ago, walked a lot more than usual. She noticed her symptoms the following day.   She's been taking Aspirin with improvement in pain. She denies hematuria, difficulty urinating, fevers, chills, injury/trauma, loss of bowel/bladder control. She has noticed some burning with urination.   She is a very active person at home. Always pushing and pulling, moving things in her home.    Review of Systems  Genitourinary:  Positive for dysuria. Negative for frequency, hematuria, pelvic pain, urgency, vaginal bleeding and vaginal discharge.       No bowel or bladder incontinence   Musculoskeletal:  Positive for arthralgias and back pain.  Neurological:  Negative for numbness.        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  Substance and Sexual Activity   Alcohol use: No    Alcohol/week: 0.0 standard drinks   Drug use: No   Sexual activity: Not on file  Other Topics Concern   Not on file  Social History Narrative   Married.   Retired.   Works as a Building control surveyor.    Enjoys helping other.    Social Determinants of Health   Financial Resource Strain: Low Risk    Difficulty of Paying Living Expenses: Not hard at all  Food Insecurity: No Food Insecurity   Worried About Charity fundraiser in the Last Year: Never true   Dent in the Last Year: Never true  Transportation Needs: No Transportation Needs   Lack of Transportation (Medical): No   Lack of Transportation (Non-Medical): No  Physical Activity: Inactive   Days of Exercise per Week: 0 days   Minutes of Exercise per Session: 0 min  Stress: No Stress Concern Present   Feeling of Stress : Only a little  Social Connections: Moderately Isolated   Frequency of Communication with Friends and Family: More than three times a week   Frequency of Social Gatherings with Friends and Family: More than three times a week  Attends Religious Services: More than 4 times per year   Active Member of Clubs or Organizations: No   Attends Archivist Meetings: Never   Marital Status: Widowed  Human resources officer Violence: Not At Risk   Fear of Current or Ex-Partner: No   Emotionally Abused: No   Physically Abused: No   Sexually Abused: No    Past Surgical History:  Procedure Laterality Date   CARDIAC CATHETERIZATION  05/07/2007   EF 55%, focal mild hypocontractility in mid-distal inferior wall & mid posterolateral wall; severe multivessel CAD - susequent CABGx6 (Dr. Corky Downs)   CORONARY ARTERY BYPASS GRAFT  05/09/2007   LIMA to LAD, veing to intermediate; SVG to OM1 & OM2; SVG to acute marginal & distal RCA (Dr. Roxan Hockey)   TRANSTHORACIC  ECHOCARDIOGRAM  02/13/2010   EF =>55%, vigorous contraction EF 65%; LA mild-mod dilated; IV normal diameter - normal CVP; trace MR; mild TR; trace AV regurg    Family History  Problem Relation Age of Onset   Cancer Mother    Diabetes Sister    Cancer Child     No Known Allergies  Current Outpatient Medications on File Prior to Visit  Medication Sig Dispense Refill   Acetaminophen (TYLENOL 8 HOUR PO) Take 1 tablet by mouth every 8 (eight) hours as needed.      aspirin 81 MG tablet Take 81 mg by mouth daily.     Azelastine HCl 137 MCG/SPRAY SOLN As needed     Blood Glucose Monitoring Suppl (ACCU-CHEK GUIDE) w/Device KIT Use as instructed to check blood sugar. 1 kit 0   glucose blood (ACCU-CHEK GUIDE) test strip USE TO CHECK SUGAR THREE TIMES DAILY AS DIRECTED 250 each 3   Insulin Pen Needle (BD PEN NEEDLE NANO 2ND GEN) 32G X 4 MM MISC USE WITH INSULIN PEN 4 TIMES DAILY. 300 each 3   LEVEMIR FLEXTOUCH 100 UNIT/ML FlexTouch Pen Inject under skin 24 units in am and 10 units at night 30 mL 3   losartan (COZAAR) 100 MG tablet Take 1 tablet by mouth once daily 90 tablet 3   metoprolol succinate (TOPROL-XL) 50 MG 24 hr tablet TAKE 1 TABLET BY MOUTH IN THE MORNING AND 1/2 (ONE-HALF) IN THE EVENING 135 tablet 1   Multiple Vitamin (MULTIVITAMIN) capsule Take 1 capsule by mouth daily.     NOVOLOG FLEXPEN 100 UNIT/ML FlexPen Inject 8-12 units 3x a day before meals under skin 30 mL 3   pantoprazole (PROTONIX) 40 MG tablet Take 1 tablet by mouth once daily 90 tablet 1   Polyethyl Glycol-Propyl Glycol (SYSTANE OP) Apply 2 drops to eye 2 (two) times daily.      prednisoLONE acetate (PRED FORTE) 1 % ophthalmic suspension      rosuvastatin (CRESTOR) 10 MG tablet Take 1 tablet (10 mg total) by mouth daily. for cholesterol. 90 tablet 3   TRAVATAN Z 0.004 % SOLN ophthalmic solution Place 1 drop into both eyes at bedtime.     No current facility-administered medications on file prior to visit.    BP 126/70    Pulse 100   Ht _0  (1.6 m)   Wt 131 lb 6 oz (59.6 kg)   SpO2 97%   BMI 23.27 kg/m  Objective:   Physical Exam Constitutional:      General: She is not in acute distress. Pulmonary:     Effort: Pulmonary effort is normal.  Abdominal:     Tenderness: There is no right CVA tenderness or left  CVA tenderness.  Musculoskeletal:     Lumbar back: Normal range of motion. Negative right straight leg raise test and negative left straight leg raise test.     Comments: Patient experienced pain to her bilateral lower back with radiation to right hip after ROM exercises today.   Skin:    General: Skin is warm and dry.  Neurological:     Mental Status: She is alert.          Assessment & Plan:

## 2021-06-09 NOTE — Patient Instructions (Signed)
Complete xray(s) prior to leaving today. I will notify you of your results once received.  Take Tylenol as needed for your back and hip pain.   Stretch and remain active.  It was a pleasure to see you today!

## 2021-06-15 ENCOUNTER — Other Ambulatory Visit: Payer: Self-pay | Admitting: Primary Care

## 2021-06-15 DIAGNOSIS — R1900 Intra-abdominal and pelvic swelling, mass and lump, unspecified site: Secondary | ICD-10-CM

## 2021-06-19 DIAGNOSIS — R42 Dizziness and giddiness: Secondary | ICD-10-CM | POA: Diagnosis not present

## 2021-06-27 DIAGNOSIS — E113311 Type 2 diabetes mellitus with moderate nonproliferative diabetic retinopathy with macular edema, right eye: Secondary | ICD-10-CM | POA: Diagnosis not present

## 2021-06-27 DIAGNOSIS — H31093 Other chorioretinal scars, bilateral: Secondary | ICD-10-CM | POA: Diagnosis not present

## 2021-06-27 DIAGNOSIS — E113313 Type 2 diabetes mellitus with moderate nonproliferative diabetic retinopathy with macular edema, bilateral: Secondary | ICD-10-CM | POA: Diagnosis not present

## 2021-06-27 DIAGNOSIS — H35373 Puckering of macula, bilateral: Secondary | ICD-10-CM | POA: Diagnosis not present

## 2021-06-27 DIAGNOSIS — H43813 Vitreous degeneration, bilateral: Secondary | ICD-10-CM | POA: Diagnosis not present

## 2021-07-10 ENCOUNTER — Other Ambulatory Visit: Payer: Medicare HMO

## 2021-07-11 ENCOUNTER — Ambulatory Visit (INDEPENDENT_AMBULATORY_CARE_PROVIDER_SITE_OTHER): Payer: Medicare HMO | Admitting: Cardiovascular Disease

## 2021-07-11 ENCOUNTER — Encounter: Payer: Self-pay | Admitting: Cardiovascular Disease

## 2021-07-11 DIAGNOSIS — Z09 Encounter for follow-up examination after completed treatment for conditions other than malignant neoplasm: Secondary | ICD-10-CM | POA: Diagnosis not present

## 2021-07-11 DIAGNOSIS — E1159 Type 2 diabetes mellitus with other circulatory complications: Secondary | ICD-10-CM

## 2021-07-11 DIAGNOSIS — Z79899 Other long term (current) drug therapy: Secondary | ICD-10-CM

## 2021-07-11 DIAGNOSIS — K219 Gastro-esophageal reflux disease without esophagitis: Secondary | ICD-10-CM

## 2021-07-11 DIAGNOSIS — Z951 Presence of aortocoronary bypass graft: Secondary | ICD-10-CM | POA: Diagnosis not present

## 2021-07-11 DIAGNOSIS — E785 Hyperlipidemia, unspecified: Secondary | ICD-10-CM | POA: Diagnosis not present

## 2021-07-11 DIAGNOSIS — Z794 Long term (current) use of insulin: Secondary | ICD-10-CM | POA: Diagnosis not present

## 2021-07-11 DIAGNOSIS — I2510 Atherosclerotic heart disease of native coronary artery without angina pectoris: Secondary | ICD-10-CM

## 2021-07-11 MED ORDER — EZETIMIBE 10 MG PO TABS: 10.0000 mg | ORAL_TABLET | Freq: Every day | ORAL | 3 refills | Status: DC

## 2021-07-24 ENCOUNTER — Telehealth: Payer: Self-pay | Admitting: Cardiovascular Disease

## 2021-07-24 NOTE — Telephone Encounter (Signed)
Pt c/o medication issue:  1. Name of Medication:  ezetimibe (ZETIA) 10 MG tablet rosuvastatin (CRESTOR) 10 MG tablet  2. How are you currently taking this medication (dosage and times per day)? Take 1 tablet (10 mg total) by mouth daily Take 1 tablet (10 mg total) by mouth daily for cholesterol.  3. Are you having a reaction (difficulty breathing--STAT)?   4. What is your medication issue? Patient wants to know if she needs to be taking both of these medication together.  She has only been taking the ezetimibe (ZETIA) 10 MG tablet.

## 2021-07-24 NOTE — Telephone Encounter (Signed)
Patient wanted to know if she should be taking crestor and zetia together. I reassured her to take both. When she kept asking about it I conferred with PharmD C. Pavero.  I called patient back and explained that she needed both for glucose and chol. control. She voiced understanding.

## 2021-07-25 DIAGNOSIS — H35373 Puckering of macula, bilateral: Secondary | ICD-10-CM | POA: Diagnosis not present

## 2021-07-25 DIAGNOSIS — E113312 Type 2 diabetes mellitus with moderate nonproliferative diabetic retinopathy with macular edema, left eye: Secondary | ICD-10-CM | POA: Diagnosis not present

## 2021-07-25 DIAGNOSIS — H31093 Other chorioretinal scars, bilateral: Secondary | ICD-10-CM | POA: Diagnosis not present

## 2021-07-25 DIAGNOSIS — H35033 Hypertensive retinopathy, bilateral: Secondary | ICD-10-CM | POA: Diagnosis not present

## 2021-07-31 ENCOUNTER — Telehealth: Payer: Self-pay | Admitting: Primary Care

## 2021-07-31 NOTE — Telephone Encounter (Signed)
Pt called in requesting a call back have questions and concerns  regarding her Ultra sound . Please Advise [phone]

## 2021-07-31 NOTE — Telephone Encounter (Signed)
Spoke to patient by telephone and was advised that someone from [place] called and left her a message this morning to call them back. It looks like the referral coordinator left her a message today. Will forward message to [place] to call the patient back.

## 2021-07-31 NOTE — Telephone Encounter (Signed)
Notes within referral/order - detailed msg left for patient this morning to call to schedule her [place] with [facility]. Pt scheduled her appt.   Referral Notes. Type Date User Summary Attachment  General 07/31/2021 11:21 AM Maisie Fus, CMA - -  Note:   LM for patient to call back for scheduling instructions.  Detailed message left to call [facility] [phone] to schedule [place]  Asked for call back if any questions.    Nothing further needed.

## 2021-08-01 NOTE — Telephone Encounter (Signed)
Looks like patient has appointment no further action needed from me at this time.

## 2021-08-08 ENCOUNTER — Ambulatory Visit: Payer: Medicare HMO

## 2021-08-08 ENCOUNTER — Telehealth: Payer: Self-pay | Admitting: Primary Care

## 2021-08-08 NOTE — Telephone Encounter (Signed)
Parker from [facility] called and stated patient didn't show for her ultrasound on her pelvis. Call back number [phone].

## 2021-08-08 NOTE — Telephone Encounter (Signed)
Please notify patient of the message we received from [facility] regarding her pelvic ultrasound.  We order the ultrasound because she has a large calcified uterine fibroid in her pelvis.  This can be seen on the x-ray from her back during her prior visit.

## 2021-08-09 ENCOUNTER — Telehealth: Payer: Self-pay | Admitting: Cardiovascular Disease

## 2021-08-09 NOTE — Telephone Encounter (Signed)
Noted  

## 2021-08-09 NOTE — Telephone Encounter (Signed)
Daughter calling in to get understanding. Patient came in to have labs work but was told that she didn't need any. But there our active labs out. Please advise

## 2021-08-09 NOTE — Telephone Encounter (Signed)
Spoke to patient's daughter Rene Kocher.She stated mother came today for lab my mistake.Advised she needs fasting labs in 6 months after starting Zetia.Orders already placed.No lab appointment needed.

## 2021-08-09 NOTE — Telephone Encounter (Signed)
Patient notified as instructed by telephone and verbalized understanding. Patient stated that she had left a message with a receptionist wanting to know why she needed the ultrasound. Patient's questions were answered. Patient stated that she has had so many appointments and they have been hard to keep up with. Patient was given the telephone number to call [facility] to reschedule the ultrasound. Patient stated that she will call and get the test rescheduled.

## 2021-08-16 ENCOUNTER — Ambulatory Visit
Admission: RE | Admit: 2021-08-16 | Discharge: 2021-08-16 | Disposition: A | Discharge status: Home / Self Care | Payer: Medicare HMO | Source: Ambulatory Visit | Attending: Primary Care | Admitting: Primary Care

## 2021-08-16 DIAGNOSIS — R1900 Intra-abdominal and pelvic swelling, mass and lump, unspecified site: Secondary | ICD-10-CM | POA: Diagnosis not present

## 2021-08-16 DIAGNOSIS — N858 Other specified noninflammatory disorders of uterus: Secondary | ICD-10-CM | POA: Diagnosis not present

## 2021-08-22 DIAGNOSIS — H903 Sensorineural hearing loss, bilateral: Secondary | ICD-10-CM | POA: Diagnosis not present

## 2021-08-22 DIAGNOSIS — R42 Dizziness and giddiness: Secondary | ICD-10-CM | POA: Diagnosis not present

## 2021-08-30 DIAGNOSIS — H401130 Primary open-angle glaucoma, bilateral, stage unspecified: Secondary | ICD-10-CM | POA: Diagnosis not present

## 2021-09-04 DIAGNOSIS — R42 Dizziness and giddiness: Secondary | ICD-10-CM | POA: Diagnosis not present

## 2021-09-05 DIAGNOSIS — H31093 Other chorioretinal scars, bilateral: Secondary | ICD-10-CM | POA: Diagnosis not present

## 2021-09-05 DIAGNOSIS — H43813 Vitreous degeneration, bilateral: Secondary | ICD-10-CM | POA: Diagnosis not present

## 2021-09-05 DIAGNOSIS — H35373 Puckering of macula, bilateral: Secondary | ICD-10-CM | POA: Diagnosis not present

## 2021-09-05 DIAGNOSIS — E113313 Type 2 diabetes mellitus with moderate nonproliferative diabetic retinopathy with macular edema, bilateral: Secondary | ICD-10-CM | POA: Diagnosis not present

## 2021-09-06 ENCOUNTER — Telehealth: Payer: Self-pay | Admitting: Primary Care

## 2021-09-06 NOTE — Telephone Encounter (Signed)
Spoke to patient by telephone and was advised that she went to an ENT that did lab work. Patient stated the doctor told her some of her kidney test came back high. Patient stated that she was told that her PCP would need to see the labs and determine what she would recommend. Labs printed and place in your office.

## 2021-09-06 NOTE — Telephone Encounter (Signed)
Patient notified as instructed by telephone and verbalized understanding. 

## 2021-09-06 NOTE — Telephone Encounter (Signed)
Please notify patient that I reviewed her labs from ENT from 09/04/2021.  Her kidney function has improved according to her recent lab draw.  No changes recommended.

## 2021-09-06 NOTE — Telephone Encounter (Signed)
Patient called in and is requesting a call back about lab results, she had labs done somewhere 08/31/21 somewhere. She said she was told by another provider to call her PCP for results because they were sent over to [place]. She couldn't give me any other info other than the date she had it done

## 2021-09-15 NOTE — Telephone Encounter (Signed)
Pt had [place] 08/16/2021  Nothing further needed.

## 2021-09-28 ENCOUNTER — Ambulatory Visit (INDEPENDENT_AMBULATORY_CARE_PROVIDER_SITE_OTHER): Payer: Medicare HMO | Admitting: Family Medicine

## 2021-09-28 ENCOUNTER — Telehealth: Payer: Self-pay

## 2021-09-28 ENCOUNTER — Encounter: Payer: Self-pay | Admitting: Family Medicine

## 2021-09-28 VITALS — BP 140/50 | HR 70 | Temp 97.4°F | Wt 127.4 lb

## 2021-09-28 DIAGNOSIS — Z794 Long term (current) use of insulin: Secondary | ICD-10-CM | POA: Diagnosis not present

## 2021-09-28 DIAGNOSIS — N183 Chronic kidney disease, stage 3 unspecified: Secondary | ICD-10-CM

## 2021-09-28 DIAGNOSIS — Z23 Encounter for immunization: Secondary | ICD-10-CM

## 2021-09-28 DIAGNOSIS — E1122 Type 2 diabetes mellitus with diabetic chronic kidney disease: Secondary | ICD-10-CM

## 2021-09-28 DIAGNOSIS — E11319 Type 2 diabetes mellitus with unspecified diabetic retinopathy without macular edema: Secondary | ICD-10-CM

## 2021-09-28 DIAGNOSIS — I10 Essential (primary) hypertension: Secondary | ICD-10-CM

## 2021-09-28 LAB — BASIC METABOLIC PANEL
BUN: 20 mg/dL (ref 6–23)
CO2: 26 mEq/L (ref 19–32)
Calcium: 9.3 mg/dL (ref 8.4–10.5)
Chloride: 107 mEq/L (ref 96–112)
Creatinine, Ser: 1.33 mg/dL — ABNORMAL HIGH (ref 0.40–1.20)
GFR: 36.03 mL/min — ABNORMAL LOW (ref >60.00)
Glucose, Bld: 256 mg/dL — ABNORMAL HIGH (ref 70–99)
Potassium: 4.1 mEq/L (ref 3.5–5.1)
Sodium: 142 mEq/L (ref 135–145)

## 2021-09-28 NOTE — Assessment & Plan Note (Signed)
Labs today. Elevated. But overall patient notes control at home.  Continue losartan 100 mg, metoprolol 50 mg in the morning and 25 mg in the evening.  Advised home monitoring for the next 1 to 2 weeks and to update with blood pressure readings.

## 2021-09-28 NOTE — Assessment & Plan Note (Signed)
Lab Results  Component Value Date   HGBA1C 8.9 (A) 06/01/2021  Poorly controlled, likely a combination of poor adherence to her insulin regimen as well as poor dietary intake.  Long discussion today encouraging patient to have regular food intake every 3-4 hours.  She is interested in [place], she notes fear of for low blood glucose is the reason for why she only takes Levemir once daily.  I do think she may benefit from a simplified insulin regimen, I will we will place a referral to pharmacy to help with cost of medications as well as management of diabetes.  I do also think she benefit from nursing support to help encourage regular dietary intake and get a good food log going and to assess glucose in response to her dietary intake.  Place a referral to CCM.  Follow-up with PCP in 1 month.  At this time she endorses taking 18 units of Levemir in the morning and NovoLog 1-2 times daily.  She is resistant to following the regimen as laid out by endocrinology see above.  And given her lack of p.o. intake I do think additional long-acting may cause lows overnight.

## 2021-09-28 NOTE — Telephone Encounter (Signed)
See note from today

## 2021-09-28 NOTE — Progress Notes (Signed)
Subjective:     Laura Ferguson is a 88 y.o. female presenting for Blood Sugar Problem (Low yesterday but seems normal today ) and Hypertension     HPI  #blood sugar changes - low CBG yesterday 74 - usually has high CBG - today fasting CBG >150 - Levemir 18 units in the morning - Novolog 12 units in the morning  She is fearful of taking the nighttime medication due to not eating regularly and fear of lows overnight   Last instructions with Dr. Lafe Garin was  Please increase: - Levemir 22 units in a.m. and 10 units at bedtime  - Novolog 6-12 units 15 min before meals   DO NOT CORRECT the blood sugars after meals.    Poor appetite Only eating about 1 meal per day Tries to avoid sweets Yesterday AM went to [employer] - sausage, pancakes, syrup   #HTN - BP elevated at home yesterday - losartan 100 mg, metoprolol 50 mg 1 AM and 1/2 evening - bp 123/59 for the most part - no cp, sob, vision changes  Review of Systems   Social History   Tobacco Use  Smoking Status Never  Smokeless Tobacco Never  Tobacco Comments   quit 1960's  smoked very lightly.        Objective:    BP Readings from Last 3 Encounters:  09/28/21 (!) 140/50  07/11/21 120/70  06/09/21 126/70   Wt Readings from Last 3 Encounters:  09/28/21 127 lb 6 oz (57.8 kg)  07/11/21 128 lb 6.4 oz (58.2 kg)  06/09/21 131 lb 6 oz (59.6 kg)    BP (!) 140/50   Pulse 70   Temp (!) 97.4 F (36.3 C) (Temporal)   Wt 127 lb 6 oz (57.8 kg)   SpO2 98%   BMI 22.56 kg/m    Physical Exam Constitutional:      General: She is not in acute distress.    Appearance: She is well-developed. She is not diaphoretic.  HENT:     Head: Normocephalic and atraumatic.     Right Ear: Tympanic membrane and external ear normal.     Left Ear: Tympanic membrane and external ear normal.     Nose: Nose normal.  Eyes:     Conjunctiva/sclera: Conjunctivae normal.  Cardiovascular:     Rate and Rhythm: Normal rate and regular  rhythm.     Heart sounds: Murmur heard.  Pulmonary:     Effort: Pulmonary effort is normal. No respiratory distress.     Breath sounds: Normal breath sounds. No wheezing.  Musculoskeletal:     Cervical back: Neck supple.  Skin:    General: Skin is warm and dry.     Capillary Refill: Capillary refill takes less than 2 seconds.  Neurological:     Mental Status: She is alert. Mental status is at baseline.  Psychiatric:        Mood and Affect: Mood normal.        Behavior: Behavior normal.           Assessment & Plan:   Problem List Items Addressed This Visit       Cardiovascular and Mediastinum   HTN (hypertension)    Labs today. Elevated. But overall patient notes control at home.  Continue losartan 100 mg, metoprolol 50 mg in the morning and 25 mg in the evening.  Advised home monitoring for the next 1 to 2 weeks and to update with blood pressure readings.  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  Long discussion today encouraging patient to have regular food intake every 3-4 hours.  She is interested in [place], she notes fear of for low blood glucose is the reason for why she only takes Levemir once daily.  I do think she may benefit from a simplified insulin regimen, I will we will place a referral to pharmacy to help with cost of medications as well as management of diabetes.  I do also think she benefit from nursing support to help encourage regular dietary intake and get a good food log going and to assess glucose in response to her dietary intake.  Place a referral to CCM.  Follow-up with PCP in 1 month.  At this time she endorses taking 18 units of Levemir in the  morning and NovoLog 1-2 times daily.  She is resistant to following the regimen as laid out by endocrinology see above.  And given her lack of p.o. intake I do think additional long-acting may cause lows overnight.       Relevant Orders   AMB Referral to Chronic Care Management Services   Other Visit Diagnoses     Need for influenza vaccination       Relevant Orders   Flu Vaccine QUAD High Dose(Fluad) (Completed)   CKD stage 3 secondary to diabetes [facility])       Relevant Orders   Basic metabolic panel   AMB Referral to Chronic Care Management Services        Return in about 4 weeks (around 10/26/2021) for with Mayra Reel.  Lynnda Child, MD

## 2021-09-28 NOTE — Telephone Encounter (Signed)
[facility] Night - Client Nonclinical Telephone Record  [facility] Client [facility] Night - Client Client Site [facility] [place] - Night Provider Vernona Rieger - NP Contact Type Call Who Is Calling Patient / Member / Family / Caregiver Caller Name Kylina Vultaggio Caller Phone Number [phone] Patient Name Laura Ferguson Patient DOB August 30, 1932 Call Type Message Only Information Provided Reason for Call Request to Schedule Office Appointment Initial Comment Caller states she wants to be seen today. She was having problems with her sugar dropping to 74 and blood pressure was high yesterday. Patient request to speak to RN No Additional Comment Office hours provided. Caller declined triage. Disp. Time Disposition Final User 09/28/2021 7:22:14 AM General Information Provided Yes Dorcas Carrow Call Closed By: Dorcas Carrow Transaction Date/Time: 09/28/2021 7:17:59 AM (ET

## 2021-09-28 NOTE — Telephone Encounter (Signed)
I spoke with pt and she said this morning her FBS was 195 and BP 148/60 P?. Pt said she felt fine today but does want appt to get cked out. Pt scheduled appt 09/28/21 at 9:20 with Dr Selena Batten. UC  & ED precautions given and pt voiced understanding. Sending note to Dr Selena Batten.

## 2021-09-28 NOTE — Patient Instructions (Addendum)
Diabetes - You need eat every 3-4 hours, EVERYDAY - if you are not hungry you should supplement with a meal replacement - I'm going to refer you to the pharmacist to see if we can get you the Dexcom  Blood pressure - continue to monitor your blood pressure - Update your cardiologist is your blood pressure remains high for

## 2021-10-05 ENCOUNTER — Telehealth: Payer: Self-pay | Admitting: Primary Care

## 2021-10-05 NOTE — Progress Notes (Signed)
  Chronic Care Management   Outreach Note  10/05/2021 Name: Laura Ferguson MRN: [id number] DOB: 03/01/1932  Referred by: Pleas Koch, NP Reason for referral : No chief complaint on file.   An unsuccessful telephone outreach was attempted today. The patient was referred to the pharmacist for assistance with care management and care coordination.   Follow Up Plan:   Laura Ferguson Upstream Scheduler

## 2021-10-09 ENCOUNTER — Telehealth: Payer: Self-pay | Admitting: Primary Care

## 2021-10-09 NOTE — Progress Notes (Signed)
  Chronic Care Management   Note  10/09/2021 Name: Laura Ferguson MRN: [id number] DOB: Aug 12, 1932  Laura Ferguson is a 88 y.o. year old female who is a primary care patient of Pleas Koch, NP. I reached out to Jerene Pitch by phone today in response to a referral sent by [name] PCP, Pleas Koch, NP.   [name] was given information about Chronic Care Management services today including:  CCM service includes personalized support from designated clinical staff supervised by her physician, including individualized plan of care and coordination with other care providers 24/7 contact phone numbers for assistance for urgent and routine care needs. Service will only be billed when office clinical staff spend 20 minutes or more in a month to coordinate care. Only one practitioner may furnish and bill the service in a calendar month. The patient may stop CCM services at any time (effective at the end of the month) by phone call to the office staff.   Patient agreed to services and verbal consent obtained.   Follow up plan:REFERRAL/ NO COPAY   Tatjana Dellinger Upstream Scheduler

## 2021-10-10 ENCOUNTER — Telehealth: Payer: Self-pay

## 2021-10-10 NOTE — Progress Notes (Signed)
Chronic Care Management Pharmacy Assistant   Name: Laura Ferguson  MRN: [id number] DOB: 07-29-1932  Reason for Encounter: CCM (Initial Questions)  Recent office visits:  09/28/21 Waunita Schooner, MD DM Labs "Please notify pt that her kidney function is stable" FU 4 weeks 09/06/21 Alma Friendly, NP Telephone "Her kidney function has improved according to her recent lab draw.  No changes recommended" 06/09/21 Alma Friendly, NP Chronic bilateral low back pain without sciatica Tylenol PRN for back/hip pain   Recent consult visits:  08/16/21 US Pelvis 07/11/21 Shelva Majestic, MD (Cardiology) Coronary artery disease Start: Ezetimibe 10 mg FU 6 months 06/27/21 Princess Bruins (Ophthalmology) Puckering of macula No other information 06/19/21 Carloyn Manner (Otolaryngology) Dizziness and giddiness No other information 07/09/21 Clayborne Dana (Optometry) Eye Exam No other information 06/01/21 Philemon Kingdom, MD DM A1c 8.9 Change: Increase Levemir 22 units in am and 10 units at bedtime. Change: Novolog 6-12 units 15 min before meals. 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    aspirin 81 MG tablet Take 81 mg by mouth daily.   Azelastine HCl 137 MCG/SPRAY SOLN As needed   Blood Glucose Monitoring Suppl (ACCU-CHEK GUIDE) w/Device KIT Use as instructed to check blood sugar.   ezetimibe (ZETIA) 10 MG tablet Take 1 tablet (10 mg total) by mouth daily.   glucose blood (ACCU-CHEK GUIDE) test strip USE TO CHECK  SUGAR THREE TIMES DAILY AS DIRECTED   hydrochlorothiazide (HYDRODIURIL) 12.5 MG tablet Take 12.5 mg by mouth daily.   Insulin Pen Needle (BD PEN NEEDLE NANO 2ND GEN) 32G X 4 MM MISC USE WITH INSULIN PEN 4 TIMES DAILY.   LEVEMIR FLEXTOUCH 100 UNIT/ML FlexTouch Pen Inject under skin 24 units in am and 10 units at night   losartan (COZAAR) 100 MG tablet Take 1 tablet by mouth once daily   metoprolol succinate (TOPROL-XL) 50 MG 24 hr tablet TAKE 1 TABLET BY MOUTH IN THE MORNING AND 1/2 (ONE-HALF) IN THE EVENING   Multiple Vitamin (MULTIVITAMIN) capsule Take 1 capsule by mouth daily.   NOVOLOG FLEXPEN 100 UNIT/ML FlexPen Inject 8-12 units 3x a day before meals under skin   pantoprazole (PROTONIX) 40 MG tablet Take 1 tablet by mouth once daily   Polyethyl Glycol-Propyl Glycol (SYSTANE OP) Apply 2 drops to eye 2 (two) times daily.    prednisoLONE acetate (PRED FORTE) 1 % ophthalmic suspension    rosuvastatin (CRESTOR) 10 MG tablet Take 1 tablet (10 mg total) by mouth daily. for cholesterol.   TRAVATAN Z 0.004 % SOLN ophthalmic solution Place 1 drop into both eyes at bedtime.   No facility-administered encounter medications on file as of 10/10/2021.    Lab Results  Component Value Date/Time   HGBA1C 8.9 (A) 06/01/2021 10:25 AM   HGBA1C 8.1 (A) 01/26/2021 10:21 AM   HGBA1C 8.3 (H) 07/14/2020 10:19 AM   HGBA1C 11.7 (H) 04/02/2016 08:49 AM   MICROALBUR 15.8 (H) 06/27/2015 12:10 PM     BP Readings from Last 3 Encounters:  09/28/21 (!) 140/50  07/11/21 120/70  06/09/21 126/70    Unsuccessful  attempt to reach patient. Left patient message reminding patient of appointment.  Attempted patient contacted to confirm in office appointment with Charlene Brooke, Pharm D, on 10/11/21 at 9:30.  CCM referral has been placed prior to visit?  Yes   Star Rating Drugs:  Medication:  Last Fill: Day Supply Losartan 100 mg 09/22/2021 90 Rosuvastatin 10 mg 09/22/2021 90  Care Gaps: Annual wellness visit  in last year? Yes 02/08/21 Most Recent BP reading: 140/50 on 09/28/2021  If Diabetic: Most recent A1C reading: 8.9 on 06/01/2021 Last eye exam / retinopathy screening: 09/14/2020 Last diabetic foot exam: Up to date  Charlene Brooke, CPP notified  Marijean Niemann, [place] Pharmacy Assistant [phone]

## 2021-10-11 ENCOUNTER — Ambulatory Visit (INDEPENDENT_AMBULATORY_CARE_PROVIDER_SITE_OTHER): Payer: Medicare HMO | Admitting: Pharmacist

## 2021-10-11 DIAGNOSIS — E785 Hyperlipidemia, unspecified: Secondary | ICD-10-CM

## 2021-10-11 DIAGNOSIS — N183 Chronic kidney disease, stage 3 unspecified: Secondary | ICD-10-CM

## 2021-10-11 DIAGNOSIS — I10 Essential (primary) hypertension: Secondary | ICD-10-CM

## 2021-10-11 DIAGNOSIS — E11319 Type 2 diabetes mellitus with unspecified diabetic retinopathy without macular edema: Secondary | ICD-10-CM

## 2021-10-11 NOTE — Patient Instructions (Addendum)
Thank you for meeting with me to discuss your medications! I look forward to working with you to achieve your health care goals. Below is a summary of what we talked about during the visit:   Please look for bottles of these medications at home:  Rosuvastatin 10 mg Pantoprazole 40 mg   I have ordered Freestyle Libre (continuous glucose monitor) to a medical supply company: [employer]. They will be contacting you in a few days to ship your device to your home.  Once you receive your [facility], call me to set up a time to come back in so I can show you how to use it. Phone number: [phone]  I am also going to discuss with your PCP and endocrinologist about changing your Levemir to a longer-acting insulin. We will be in touch about this.     Goals Addressed   None     Care Plan : CCM Pharmacy Care Plan  Updates made by Kathyrn Sheriff, RPH since 10/11/2021 12:00 AM     Problem: Hypertension, Hyperlipidemia, and Diabetes      Long-Range Goal: Disease mgmt   Start Date: 10/11/2021  Expected End Date: 10/12/2022  This Visit's Progress: On track  Priority: High  Note:   Current Barriers:  Unable to maintain control of diabetes Noncompliance with insulin regimen  Pharmacist Clinical Goal(s):  Patient will achieve improvement in DM as evidenced by A1c < 8% adhere to prescribed medication regimen as evidenced by patient report through collaboration with PharmD and provider.   Interventions: 1:1 collaboration with Doreene Nest, NP regarding development and update of comprehensive plan of care as evidenced by provider attestation and co-signature Inter-disciplinary care team collaboration (see longitudinal plan of care) Comprehensive medication review performed; medication list updated in electronic medical record  Hypertension (BP goal <140/90) -Controlled - BP at goal in recent clinic visits; pt brought BP monitor to office, verified accuracy with  manual BP -Current treatment: HCTZ 12.5 mg daily - not taking, completed course Losartan 100 mg daily - Appropriate, Effective, Safe, Accessible Metoprolol succinate 50 mg - 1 AM, 1/2 PM -Appropriate, Effective, Safe, Accessible -Medications previously tried: HCTZ  -Current home readings: none available -Denies hypotensive/hypertensive symptoms -Educated on BP goals and benefits of medications for prevention of heart attack, stroke and kidney damage; Proper BP monitoring technique; -Counseled to monitor BP at home daily -Recommended to continue current medication  Hyperlipidemia: (LDL goal < 70) -Query Controlled - LDL 104 (03/2021) off of rosuvastatin, she restarted rosuvastatin earlier this year and does report compliance but did not bring bottle to appt today;  -per chart review, ezetimibe was added in June per cardiology, not realizing recent LDL represented time off of statin -CAD - HX CABG 2009 -Current treatment: Rosuvastatin 10 mg daily - Appropriate, Query Effective Ezetimibe 10 mg daily - Query appropriate Aspirin 81 mg daily -Medications previously tried: n/a  -Educated on Cholesterol goals; Benefits of statin for ASCVD risk reduction; -Recommended to continue current medication; repeat lipid panel at next OV; consider discontinuing ezetimibe if LDL is < 55 as it may not be necessary  Diabetes (A1c goal <8%) -Uncontrolled - A1c 8.9% (05/2021) above goal; pt is not compliant with insulin regimen as prescribed, mainly due to fear of low sugars; she takes Levemir 18 units AM and 12 units in PM, though she will skip Levemir if she deems BG too low (recently skipped when BG was 106) -Dx 1990s, insulin-dependent for years -Current home glucose readings: not  available -Current medications: Levemir - 24 units AM, 10 units PM - Query appropriate Novolog - 8-12 units before meals - Query appropriate -Medications previously tried: glipizide, metformin, Ozempic  -Current meal patterns:   drinks: cranberry juice, apple juice - discussed high sugar content in juices -Current exercise: limited -Educated on A1c and blood sugar goals;Prevention and management of hypoglycemic episodes; Continuous glucose monitoring -Advised to always take basal insulin unless BG is very low (< 80) -Reviewed optimization strategy: pt is now injecting insulin 4-5 times per day and having low sugars, I believe she is noncompliant partially due to complexity of regimen; she may benefit from simplifying basal insulin to a once-daily option and may benefit further from low-dose GLP-1 RA which may allow for discontinuation of bolus insulin -Recommend to switch Levemir to Soliqua 20 units daily; hold Novolog  -Ordered Freestyle Libre 3 to [employer] [facility] [employer]  Health Maintenance -Vaccine gaps: PPSV or PCV20; Shingrix, TDAP   Patient Goals/Self-Care Activities Patient will:  - take medications as prescribed as evidenced by patient report and record review focus on medication adherence by routine check glucose as directed, document, and provide at future appointments check blood pressure daily, document, and provide at future appointments      Ms. Ragin was given information about Chronic Care Management services today including:  CCM service includes personalized support from designated clinical staff supervised by her physician, including individualized plan of care and coordination with other care providers 24/7 contact phone numbers for assistance for urgent and routine care needs. Standard insurance, coinsurance, copays and deductibles apply for chronic care management only during months in which we provide at least 20 minutes of these services. Most insurances cover these services at 100%, however patients may be responsible for any copay, coinsurance and/or deductible if applicable. This service may help you avoid the need for more expensive face-to-face services. Only one practitioner may furnish  and bill the service in a calendar month. The patient may stop CCM services at any time (effective at the end of the month) by phone call to the office staff.  Patient agreed to services and verbal consent obtained.   Print copy of patient instructions, educational materials, and care plan provided in person.  The pharmacy team will reach out to the patient again over the next 14 days.   Charlene Brooke, PharmD, BCACP Clinical Pharmacist [facility] [phone]

## 2021-10-11 NOTE — Progress Notes (Signed)
Chronic Care Management Pharmacy Note  10/11/2021 Name:  Laura Ferguson MRN:  [id number] DOB:  11-Jun-1932  Summary: CCM Initial visit -Reviewed medications with patient; she brought all bottles in except rosuvastatin and pantoprazole, though she states she does have these at home and takes them -DM: A1c 8.9% (05/2021), follows with endocrine. She is not compliant with insulin as prescribed but takes Levemir 18 units in AM, 12 units in PM - but skips if sugar is "low" (<120) as she is concerned about low sugars. Reviewed importance of basal insulin and not skipping doses unless sugar is < 80.   Recommendations/Changes made from today's visit: -[place] 3 to [employer]. Pt will call to schedule training when she receives device. -Recommend to switch Levemir to Soliqua 20 units once daily; hold Novolog - pt to discuss with PCP at appt tomorrow  Plan: -[place] will call patient in 1 week to check on CGM delivery, schedule training appt -PCP appt 10/12/21    Subjective: Laura Ferguson is an 88 y.o. year old female who is a primary patient of Laura Koch, NP.  The CCM team was consulted for assistance with disease management and care coordination needs.    Engaged with patient face to face for initial visit in response to provider referral for pharmacy case management and/or care coordination services.   Consent to Services:  The patient was given the following information about Chronic Care Management services today, agreed to services, and gave verbal consent: 1. CCM service includes personalized support from designated clinical staff supervised by the primary care provider, including individualized plan of care and coordination with other care providers 2. 24/7 contact phone numbers for assistance for urgent and routine care needs. 3. Service will only be billed when office clinical staff spend 20 minutes or more in a month to coordinate care. 4. Only one  practitioner may furnish and bill the service in a calendar month. 5.The patient may stop CCM services at any time (effective at the end of the month) by phone call to the office staff. 6. The patient will be responsible for cost sharing (co-pay) of up to 20% of the service fee (after annual deductible is met). Patient agreed to services and consent obtained.  Patient Care Team: Laura Koch, NP as PCP - General (Internal Medicine) Troy Sine, MD as PCP - Cardiology (Cardiology) Charlton Haws, [facility] as Pharmacist (Pharmacist)  Social: She had 8 children, 6 boys and 2 girls.  She just buried her last son several months ago.  She has 2 living daughters  Recent office visits: 09/28/21 Dr Einar Pheasant OV: A1c 8.9%; poor adherence to insulin, poor dietary intake; interested in CGM - fear of lows. May benefit from simplified insulin regimen. Referred to CCM.  09/06/21 Alma Friendly, NP Telephone "Her kidney function has improved according to her recent lab draw.  No changes recommended"  06/09/21 Alma Friendly, NP Chronic bilateral low back pain without sciatica Tylenol PRN for back/hip pain   Recent consult visits: 07/11/21 Dr Claiborne Billings (Cardiology): CAD; LDL increased from 55 to 104 with switch from rosuvastatin to pravastatin. Start Ezetimibe 10 mg  06/27/21 Princess Bruins (Ophthalmology) Puckering of macula No other information 06/19/21 Carloyn Manner (Otolaryngology) Dizziness and giddiness No other information 07/09/21 Clayborne Dana (Optometry) Eye Exam No other information  06/01/21 Dr Cruzita Lederer (Endocrine):  DM A1c 8.9%; Increase Levemir to 22 units AM, 10 units PM. Novolog 6-12 units before meals.  05/29/21  Princess Bruins (Ophthalmology)  05/01/21 Princess Bruins (Ophthalmology)   Hospital visits: None in previous 6 months   Objective:  Lab Results  Component Value Date   CREATININE 1.33 (H) 09/28/2021   BUN 20 09/28/2021   GFR 36.03 (L) 09/28/2021   EGFR 41 (L)  07/14/2020   GFRNONAA 32 (L) 11/05/2018   GFRAA 38 (L) 11/05/2018   NA 142 09/28/2021   K 4.1 09/28/2021   CALCIUM 9.3 09/28/2021   CO2 26 09/28/2021   GLUCOSE 256 (H) 09/28/2021    Lab Results  Component Value Date/Time   HGBA1C 8.9 (A) 06/01/2021 10:25 AM   HGBA1C 8.1 (A) 01/26/2021 10:21 AM   HGBA1C 8.3 (H) 07/14/2020 10:19 AM   HGBA1C 11.7 (H) 04/02/2016 08:49 AM   GFR 36.03 (L) 09/28/2021 09:57 AM   GFR 33.42 (L) 04/06/2021 11:11 AM   MICROALBUR 15.8 (H) 06/27/2015 12:10 PM    Last diabetic Eye exam:  Lab Results  Component Value Date/Time   HMDIABEYEEXA Retinopathy (A) 09/14/2020 12:00 AM    Last diabetic Foot exam: No results found for: "HMDIABFOOTEX"   Lab Results  Component Value Date   CHOL 194 04/06/2021   HDL 65.80 04/06/2021   LDLCALC 104 (H) 04/06/2021   TRIG 121.0 04/06/2021   CHOLHDL 3 04/06/2021       Latest Ref Rng & Units 04/06/2021   11:11 AM 07/14/2020   10:19 AM 09/07/2019    8:38 AM  Hepatic Function  Total Protein 6.0 - 8.3 g/dL 7.0  7.4  6.9   Albumin 3.5 - 5.2 g/dL 4.1  4.6  3.9   AST 0 - 37 U/L 17  22  16    ALT 0 - 35 U/L 15  21  15    Alk Phosphatase 39 - 117 U/L 91  91  65   Total Bilirubin 0.2 - 1.2 mg/dL 0.6  0.5  0.5     Lab Results  Component Value Date/Time   TSH 0.565 07/14/2020 10:19 AM   TSH 0.52 03/02/2020 12:57 PM       Latest Ref Rng & Units 07/14/2020   10:19 AM 03/02/2020   12:57 PM 11/05/2018    6:32 PM  CBC  WBC 3.4 - 10.8 x10E3/uL 5.5  4.9  4.9   Hemoglobin 11.1 - 15.9 g/dL 12.8  12.2  12.2   Hematocrit 34.0 - 46.6 % 38.0  36.5  36.9   Platelets 150 - 450 x10E3/uL 180  176.0  166     No results found for: "VD25OH"  Clinical ASCVD: Yes  The ASCVD Risk score (Arnett DK, et al., 2019) failed to calculate for the following reasons:   The 2019 ASCVD risk score is only valid for ages 71 to 63       02/08/2021    8:20 AM 10/13/2019    2:05 PM 09/22/2019   12:15 PM  Depression screen PHQ 2/9  Decreased Interest  0 2 3  Down, Depressed, Hopeless 1 3 3   PHQ - 2 Score 1 5 6   Altered sleeping  0 3  Tired, decreased energy  0 3  Change in appetite  0 2  Feeling bad or failure about yourself   3 1  Trouble concentrating  0 0  Moving slowly or fidgety/restless  0 2  Suicidal thoughts  0 0  PHQ-9 Score  8 17  Difficult doing work/chores  Not difficult at all Somewhat difficult    Social History   Tobacco Use  Smoking  Status Never  Smokeless Tobacco Never  Tobacco Comments   quit 1960's  smoked very lightly.   BP Readings from Last 3 Encounters:  09/28/21 (!) 140/50  07/11/21 120/70  06/09/21 126/70   Pulse Readings from Last 3 Encounters:  09/28/21 70  07/11/21 72  06/09/21 100   Wt Readings from Last 3 Encounters:  09/28/21 127 lb 6 oz (57.8 kg)  07/11/21 128 lb 6.4 oz (58.2 kg)  06/09/21 131 lb 6 oz (59.6 kg)   BMI Readings from Last 3 Encounters:  09/28/21 22.56 kg/m  07/11/21 22.75 kg/m  06/09/21 23.27 kg/m    Assessment/Interventions: Review of patient past medical history, allergies, medications, health status, including review of consultants reports, laboratory and other test data, was performed as part of comprehensive evaluation and provision of chronic care management services.   SDOH:  (Social Determinants of Health) assessments and interventions performed: Yes SDOH Interventions    Flowsheet Row Chronic Care Management from 10/11/2021 in [place] at [facility] Visit from 10/13/2019 in [facility] [place] [facility] at [facility] Visit from 09/22/2019 in [place] at [place] from 05/19/2019 in [place] at [place]  SDOH Interventions      Transportation Interventions Intervention Not Indicated -- -- --  Depression Interventions/Treatment  -- Counseling Medication PHQ2-9 Score <4 Follow-up Not Indicated  Financial Strain Interventions Intervention Not Indicated -- -- --      SDOH Screenings   Food Insecurity:  No Food Insecurity (02/08/2021)  Housing: Low Risk  (02/08/2021)  Transportation Needs: No Transportation Needs (02/08/2021)  Alcohol Screen: Low Risk  (02/08/2021)  Depression (PHQ2-9): Low Risk  (02/08/2021)  Financial Resource Strain: Low Risk  (02/08/2021)  Physical Activity: Inactive (02/08/2021)  Social Connections: Moderately Isolated (02/08/2021)  Stress: No Stress Concern Present (02/08/2021)  Tobacco Use: Low Risk  (09/28/2021)    [place]  No Known Allergies  Medications Reviewed Today     Reviewed by Charlton Haws, [facility] (Pharmacist) on 10/11/21 at 1614  Med List Status: <None>   Medication Order Taking? Sig Documenting Provider Last Dose Status Informant  Acetaminophen (TYLENOL 8 HOUR PO) [id number] Yes Take 1 tablet by mouth every 8 (eight) hours as needed.  [provider] Taking Active Self  aspirin 81 MG tablet [id number] Yes Take 81 mg by mouth daily. [provider] Taking Active Self  Azelastine HCl 137 MCG/SPRAY SOLN [id number] Yes As needed [provider] Taking Active   Blood Glucose Monitoring Suppl (ACCU-CHEK GUIDE) w/Device KIT [id number] Yes Use as instructed to check blood sugar. Philemon Kingdom, MD Taking Active   Cholecalciferol (VITAMIN D) 50 MCG (2000 UT) CAPS [id number] Yes Take 1 capsule by mouth daily. [provider] Taking Active   ezetimibe (ZETIA) 10 MG tablet [id number] Yes Take 1 tablet (10 mg total) by mouth daily. Troy Sine, MD Taking Active   fexofenadine [facility]) 180 MG tablet [id number] Yes Take 180 mg by mouth daily. [provider] Taking Active   glucose blood (ACCU-CHEK GUIDE) test strip [id number] Yes USE TO CHECK SUGAR THREE TIMES DAILY AS DIRECTED Philemon Kingdom, MD Taking Active   Insulin Pen Needle (BD PEN NEEDLE NANO 2ND GEN) 32G X 4 MM MISC [id number] Yes USE WITH INSULIN PEN 4 TIMES DAILY. Philemon Kingdom, MD Taking Active   LEVEMIR FLEXTOUCH 100 UNIT/ML FlexTouch Pen [id number]  Yes Inject under skin 24 units in am and 10 units at night  Patient taking differently: Inject under skin 18  units in am and 12 units at night   Philemon Kingdom, MD Taking Active   losartan (COZAAR) 100 MG tablet [id number] Yes Take 1 tablet by mouth once daily Troy Sine, MD Taking Active   metoprolol succinate (TOPROL-XL) 50 MG 24 hr tablet [id number] Yes TAKE 1 TABLET BY MOUTH IN THE MORNING AND 1/2 (ONE-HALF) IN THE Marcello Fennel, MD Taking Active   Multiple Vitamin (MULTIVITAMIN) capsule [id number] Yes Take 1 capsule by mouth daily. [provider] Taking Active Self  NOVOLOG FLEXPEN 100 UNIT/ML FlexPen [id number] Yes Inject 8-12 units 3x a day before meals under skin Philemon Kingdom, MD Taking Active   pantoprazole (PROTONIX) 40 MG tablet [id number] Yes Take 1 tablet by mouth once daily Troy Sine, MD Taking Active   Polyethyl Glycol-Propyl Glycol [facility]) [id number] Yes Apply 2 drops to eye 2 (two) times daily.  [provider] Taking Active Self  rosuvastatin (CRESTOR) 10 MG tablet [id number] Yes Take 1 tablet (10 mg total) by mouth daily. for cholesterol. Laura Koch, NP Taking Active   TRAVATAN Z 0.004 % SOLN ophthalmic solution [id number] Yes Place 1 drop into both eyes at bedtime. [provider] Taking Active Self           Med Note Foye Spurling Dec 05, 2014  2:09 PM)              Patient Active Problem List   Diagnosis Date Noted   Myalgia 08/04/2020   Fatigue 03/02/2020   Preventative health care 10/13/2019   Other social stressor 08/31/2019   Acute left ankle pain 03/09/2019   Acute pain of left foot 03/09/2019   Type 2 diabetes mellitus with retinopathy ([place]) 01/29/2018   Chronic back pain 11/12/2017   Anxiety and depression 10/16/2017   Ankle swelling 06/01/2015   Dizziness 03/26/2015   CAD (coronary artery disease) 12/29/2012   HTN (hypertension) 12/29/2012   Hyperlipidemia 12/29/2012     Immunization History  Administered Date(s) Administered   Fluad Quad(high Dose 65+) 10/16/2018, 10/13/2019, 09/28/2021   Influenza,inj,Quad PF,6+ Mos 09/25/2017   PFIZER Comirnaty(Gray Top)Covid-19 Tri-Sucrose Vaccine 07/05/2020   PFIZER(Purple Top)SARS-COV-2 Vaccination 03/15/2019, 04/04/2019, 09/15/2019   Pfizer Covid-19 Vaccine Bivalent Booster 24yr & up 01/02/2021   Pneumococcal Conjugate-13 10/13/2019    Conditions to be addressed/monitored:  Hypertension, Hyperlipidemia, and Diabetes  [place] : [place] Updates made by FCharlton Haws RGrahamsince 10/11/2021 12:00 AM     Problem: Hypertension, Hyperlipidemia, and Diabetes      Long-Range Goal: Disease mgmt   Start Date: 10/11/2021  Expected End Date: 10/12/2022  This Visit's Progress: On track  Priority: High  Note:   Current Barriers:  Unable to maintain control of diabetes Noncompliance with insulin regimen  Pharmacist Clinical Goal(s):  Patient will achieve improvement in DM as evidenced by A1c < 8% adhere to prescribed medication regimen as evidenced by patient report through collaboration with PharmD and provider.   Interventions: 1:1 collaboration with CPleas Koch NP regarding development and update of comprehensive plan of care as evidenced by provider attestation and co-signature Inter-disciplinary care team collaboration (see longitudinal plan of care) Comprehensive medication review performed; medication list updated in electronic medical record  Hypertension (BP goal <140/90) -Controlled - BP at goal in recent clinic visits; pt brought BP monitor to office, verified accuracy with manual BP -Current treatment: HCTZ 12.5 mg daily - not taking, completed course Losartan 100 mg daily -  Appropriate, Effective, Safe, Accessible Metoprolol succinate 50 mg - 1 AM, 1/2 PM -Appropriate, Effective, Safe, Accessible -Medications previously tried: HCTZ  -Current home readings: none  available -Denies hypotensive/hypertensive symptoms -Educated on BP goals and benefits of medications for prevention of heart attack, stroke and kidney damage; Proper BP monitoring technique; -Counseled to monitor BP at home daily -Recommended to continue current medication  Hyperlipidemia: (LDL goal < 70) -Query Controlled - LDL 104 (03/2021) off of rosuvastatin, she restarted rosuvastatin earlier this year and does report compliance but did not bring bottle to appt today;  -per chart review, ezetimibe was added in June per cardiology, not realizing recent LDL represented time off of statin -CAD - HX CABG 2009 -Current treatment: Rosuvastatin 10 mg daily - Appropriate, Query Effective Ezetimibe 10 mg daily - Query appropriate Aspirin 81 mg daily -Medications previously tried: n/a  -Educated on Cholesterol goals; Benefits of statin for ASCVD risk reduction; -Recommended to continue current medication; repeat lipid panel at next OV; consider discontinuing ezetimibe if LDL is < 55 as it may not be necessary  Diabetes (A1c goal <8%) -Uncontrolled - A1c 8.9% (05/2021) above goal; pt is not compliant with insulin regimen as prescribed, mainly due to fear of low sugars; she takes Levemir 18 units AM and 12 units in PM, though she will skip Levemir if she deems BG too low (recently skipped when BG was 106) -Dx 1990s, insulin-dependent for years -Current home glucose readings: not available -Current medications: Levemir - 24 units AM, 10 units PM - Query appropriate Novolog - 8-12 units before meals - Query appropriate -Medications previously tried: glipizide, metformin, Ozempic  -Current meal patterns:  drinks: cranberry juice, apple juice - discussed high sugar content in juices -Current exercise: limited -Educated on A1c and blood sugar goals;Prevention and management of hypoglycemic episodes; Continuous glucose monitoring -Advised to always take basal insulin unless BG is very low (<  80) -Reviewed optimization strategy: pt is now injecting insulin 4-5 times per day and having low sugars, I believe she is noncompliant partially due to complexity of regimen; she may benefit from simplifying basal insulin to a once-daily option and may benefit further from low-dose GLP-1 RA which may allow for discontinuation of bolus insulin -Recommend to switch Levemir to Soliqua 20 units daily; hold Novolog  -Ordered Freestyle Libre 3 to [employer]  Health Maintenance -Vaccine gaps: PPSV or PCV20; Shingrix, TDAP   Patient Goals/Self-Care Activities Patient will:  - take medications as prescribed as evidenced by patient report and record review focus on medication adherence by routine check glucose as directed, document, and provide at future appointments check blood pressure daily, document, and provide at future appointments      Medication Assistance: None required.  Patient affirms current coverage meets needs.  Compliance/Adherence/Medication fill history: Care Gaps: DEXA scan  Star-Rating Drugs: Rosuvastatin - PDC 100% Losartan - PDC 99%  Medication Access: Within the past 30 days, how often has patient missed a dose of medication? 1 Is a pillbox or other method used to improve adherence? No  Factors that may affect medication adherence? lack of understanding of disease management Are meds synced by current pharmacy? No  Are meds delivered by current pharmacy? No  Does patient experience delays in picking up medications due to transportation concerns? No   [facility] Services Reviewed: Is patient disadvantaged to use [facility]?: No  Current Rx insurance plan: [employer] Name and location of Current pharmacy:  [facility] [address], [place] - [place]  [place] [address] Phone: [phone] Fax: [phone]  [facility] services reviewed with patient today?: No  Patient requests to transfer care to [facility]  Pharmacy?: No  Reason patient declined to change pharmacies: Not mentioned at this visit   Care Plan and Follow Up Patient Decision:  Patient agrees to Care Plan and Follow-up.  Plan: The patient has been provided with contact information for the care management team and has been advised to call with any health related questions or concerns.   Charlene Brooke, PharmD, BCACP Clinical Pharmacist [facility] at [facility] [phone]

## 2021-10-12 ENCOUNTER — Ambulatory Visit (INDEPENDENT_AMBULATORY_CARE_PROVIDER_SITE_OTHER): Payer: Medicare HMO | Admitting: Primary Care

## 2021-10-12 ENCOUNTER — Encounter: Payer: Self-pay | Admitting: Primary Care

## 2021-10-12 VITALS — BP 128/64 | HR 65 | Temp 97.5°F | Ht 63.0 in | Wt 128.0 lb

## 2021-10-12 DIAGNOSIS — E11319 Type 2 diabetes mellitus with unspecified diabetic retinopathy without macular edema: Secondary | ICD-10-CM | POA: Diagnosis not present

## 2021-10-12 DIAGNOSIS — Z794 Long term (current) use of insulin: Secondary | ICD-10-CM

## 2021-10-12 DIAGNOSIS — Z23 Encounter for immunization: Secondary | ICD-10-CM | POA: Diagnosis not present

## 2021-10-12 LAB — POCT GLYCOSYLATED HEMOGLOBIN (HGB A1C): Hemoglobin A1C: 7.9 % — AB (ref 4.0–5.6)

## 2021-10-12 MED ORDER — SOLIQUA 100-33 UNT-MCG/ML ~~LOC~~ SOPN: 20.0000 [IU] | PEN_INJECTOR | Freq: Every day | SUBCUTANEOUS | 0 refills | Status: DC

## 2021-10-12 NOTE — Progress Notes (Signed)
Subjective:    Patient ID: Laura Ferguson, female    DOB: 03-26-1932, 88 y.o.   MRN: [id number]  HPI  Laura Ferguson is a very pleasant 88 y.o. female with a history of uncontrolled type 2 diabetes, CAD, hypertension, anxiety and depression who presents today to discuss diabetes, diabetes regimen, noncompliance with diabetes regimen.  Following with endocrinology, long history of noncompliance with prescribed regimen.  Last evaluated by Dr. Einar Pheasant on 09/28/2021 for concerns of hypoglycemia with glucose level of 74.  She admitted to eliminating doses of her insulin due to fear of hypoglycemia.  Currently prescribed Levemir 22 units units in the morning and 10 units at bedtime, NovoLog 6 to 12 units 15 minutes before meals.  Last A1c was 8.9 in May 2023.  She met with our pharmacist yesterday, admits to taking Levemir 18 units in a.m., 12 units at bedtime but she will eliminate her evening dose if her glucose reading is below 120.  During this visit freestyle libre 3 was ordered for better glucose monitoring.  She was advised to refrain from eliminating insulin doses as instructed.  It was also recommended to simplify her medication regimen for better compliance and to avoid hypoglycemic episodes.  Recommendations were to discontinue Levemir, add Soliqua 20 units once daily, hold NovoLog insulin.   Today her A1C is 7.9. She is not eating much during the day, her main meal is breakfast. She doesn't each lunch or dinner most of the time, sometimes eats a protein with dinner. She is injecting 12 units of Novolog once daily most of the time, but will inject Novolog in the evening if she eats. She notices dizziness in the afternoon for which attributes to Levemir.   She is checking her glucose readings 4-5 times daily and is getting readings ranging 60's to low 300's. She cannot recall exact numbers. She does believe her glucose readings have come down recently.   BP Readings from Last 3 Encounters:  10/12/21  128/64  09/28/21 (!) 140/50  07/11/21 120/70      Review of Systems  Respiratory:  Negative for shortness of breath.   Cardiovascular:  Negative for chest pain.  Neurological:  Positive for dizziness. Negative for numbness.         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  Substance and Sexual Activity   Alcohol use: No    Alcohol/week: 0.0 standard drinks of alcohol   Drug use: No   Sexual activity: Not on file  Other Topics Concern   Not on file  Social History Narrative   Married.   Retired.  Works as a Building control surveyor.    Enjoys helping other.    Social Determinants of Health   Financial Resource Strain: Low Risk  (02/08/2021)   Overall Financial Resource Strain (CARDIA)    Difficulty of Paying Living Expenses: Not hard at all  Food Insecurity: No Food Insecurity (02/08/2021)   Hunger Vital Sign    Worried About Running Out of Food in the Last Year: Never true    Ran Out of Food in the Last Year: Never true  Transportation Needs: No Transportation Needs (02/08/2021)   PRAPARE - Hydrologist (Medical): No    Lack of Transportation (Non-Medical): No  Physical Activity: Inactive (02/08/2021)   Exercise Vital Sign    Days of Exercise per Week: 0  days    Minutes of Exercise per Session: 0 min  Stress: No Stress Concern Present (02/08/2021)   [place]    Feeling of Stress : Only a little  Social Connections: Moderately Isolated (02/08/2021)   Social Connection and Isolation Panel [NHANES]    Frequency of Communication with Friends and Family: More than three times a week    Frequency of Social Gatherings with Friends and Family: More than three times a week    Attends Religious Services: More than 4 times per year    Active Member of [employer] or Organizations: No    Attends Archivist Meetings: Never    Marital Status: Widowed  Intimate Partner Violence: Not At Risk (02/08/2021)   Humiliation, Afraid, Rape, and Kick questionnaire    Fear of Current or Ex-Partner: No    Emotionally Abused: No    Physically Abused: No    Sexually Abused: No    Past Surgical History:  Procedure Laterality Date   CARDIAC CATHETERIZATION  05/07/2007   EF 55%, focal mild hypocontractility in mid-distal inferior wall & mid posterolateral wall; severe multivessel CAD - susequent CABGx6 (Dr. Corky Downs)   CORONARY ARTERY BYPASS GRAFT  05/09/2007   LIMA to LAD, veing to intermediate; SVG to OM1 & OM2; SVG to acute marginal & distal RCA (Dr. Roxan Hockey)   TRANSTHORACIC ECHOCARDIOGRAM  02/13/2010   EF =>55%, vigorous contraction EF 65%; LA mild-mod dilated; IV normal diameter - normal CVP; trace MR; mild TR; trace AV regurg    Family History  Problem Relation Age of Onset   Cancer Mother    Diabetes Sister    Cancer Child     No Known Allergies  Current Outpatient Medications on File Prior to Visit  Medication Sig Dispense Refill   Acetaminophen (TYLENOL 8 HOUR PO) Take 1 tablet by mouth every 8 (eight) hours as needed.      aspirin 81 MG tablet Take 81 mg by mouth daily.     Azelastine HCl 137 MCG/SPRAY SOLN As needed     Blood Glucose Monitoring Suppl (ACCU-CHEK GUIDE)  w/Device KIT Use as instructed to check blood sugar. 1 kit 0   Cholecalciferol (VITAMIN D) 50 MCG (2000 UT) CAPS Take 1 capsule by mouth daily.     ezetimibe (ZETIA) 10 MG tablet Take 1 tablet (10 mg total) by mouth daily. 90 tablet 3   fexofenadine (ALLEGRA) 180 MG tablet Take 180 mg by mouth daily.     glucose blood (ACCU-CHEK GUIDE) test strip USE TO CHECK SUGAR THREE TIMES DAILY AS DIRECTED 250 each 3   Insulin Pen Needle (BD PEN NEEDLE NANO 2ND GEN) 32G X 4 MM MISC  USE WITH INSULIN PEN 4 TIMES DAILY. 300 each 3   losartan (COZAAR) 100 MG tablet Take 1 tablet by mouth once daily 90 tablet 3   metoprolol succinate (TOPROL-XL) 50 MG 24 hr tablet TAKE 1 TABLET BY MOUTH IN THE MORNING AND 1/2 (ONE-HALF) IN THE EVENING 135 tablet 1   Multiple Vitamin (MULTIVITAMIN) capsule Take 1 capsule by mouth daily.     pantoprazole (PROTONIX) 40 MG tablet Take 1 tablet by mouth once daily 90 tablet 1   Polyethyl Glycol-Propyl Glycol (SYSTANE OP) Apply 2 drops to eye 2 (two) times daily.      rosuvastatin (CRESTOR) 10 MG tablet Take 1 tablet (10 mg total) by mouth daily. for cholesterol. 90 tablet 3   TRAVATAN Z 0.004 % SOLN ophthalmic solution Place 1 drop into both eyes at bedtime.     No current facility-administered medications on file prior to visit.    BP 128/64   Pulse 65   Temp (!) 97.5 F (36.4 C) (Temporal)   Ht 5' 3"  (1.6 m)   Wt 128 lb (58.1 kg)   SpO2 99%   BMI 22.67 kg/m  Objective:   Physical Exam Cardiovascular:     Rate and Rhythm: Normal rate and regular rhythm.  Pulmonary:     Effort: Pulmonary effort is normal.     Breath sounds: Normal breath sounds.  Musculoskeletal:     Cervical back: Neck supple.  Skin:    General: Skin is warm and dry.           Assessment & Plan:   Problem List Items Addressed This Visit       Endocrine   Type 2 diabetes mellitus with retinopathy ([place]) - Primary    Improved with A1C of 7.9.  Poor historian, cannot give me straight  answers regarding her glucose readings and her prescribed regimen.   We had a long discussion today regarding her non compliance to her prescribed regimen and her fears of hyperglycemia.  Pharmacy recommends simplifying her regimen for better compliance, I agree.   Stop Levemir, start [place] 20 units daily. Stop Novolog for now, reintroduce for hyperglycemia. Discussed to call with glucose readings consistently above 200.  Close follow up needed. She plans to follow up with endocrinology.       Relevant Medications   Insulin Glargine-Lixisenatide (SOLIQUA) 100-33 UNT-MCG/ML SOPN   Other Relevant Orders   POCT glycosylated hemoglobin (Hb A1C) (Completed)       Pleas Koch, NP

## 2021-10-12 NOTE — Assessment & Plan Note (Addendum)
Improved with A1C of 7.9.  Poor historian, cannot give me straight answers regarding her glucose readings and her prescribed regimen.   We had a long discussion today regarding her non compliance to her prescribed regimen and her fears of hyperglycemia.  Pharmacy recommends simplifying her regimen for better compliance, I agree.   Stop Levemir, start [place] 20 units daily. Stop Novolog for now, reintroduce for hyperglycemia. Discussed to call with glucose readings consistently above 200.  Close follow up needed. She plans to follow up with endocrinology.   Updated pneumonia vaccine today

## 2021-10-12 NOTE — Patient Instructions (Addendum)
Start insulin glargine-lixisenatide Laura Ferguson) for diabetes. Inject 20 units into the skin every morning.   Stop Levemir. Stop Novolog for now.  Please call me if your blood sugars remain at or above 200 after 2 weeks.  You may follow up with myself or Dr. Cruzita Lederer as discussed.  It was a pleasure to see you today!

## 2021-10-12 NOTE — Addendum Note (Signed)
Addended by: Pat Kocher on: 10/12/2021 09:55 AM   Modules accepted: Orders

## 2021-10-14 DIAGNOSIS — E1159 Type 2 diabetes mellitus with other circulatory complications: Secondary | ICD-10-CM | POA: Diagnosis not present

## 2021-10-14 DIAGNOSIS — I10 Essential (primary) hypertension: Secondary | ICD-10-CM

## 2021-10-14 DIAGNOSIS — E785 Hyperlipidemia, unspecified: Secondary | ICD-10-CM

## 2021-10-14 DIAGNOSIS — Z794 Long term (current) use of insulin: Secondary | ICD-10-CM

## 2021-10-19 ENCOUNTER — Other Ambulatory Visit: Payer: Self-pay | Admitting: Cardiovascular Disease

## 2021-10-20 DIAGNOSIS — E1165 Type 2 diabetes mellitus with hyperglycemia: Secondary | ICD-10-CM | POA: Diagnosis not present

## 2021-10-20 DIAGNOSIS — Z794 Long term (current) use of insulin: Secondary | ICD-10-CM | POA: Diagnosis not present

## 2021-10-24 ENCOUNTER — Telehealth: Payer: Self-pay | Admitting: Primary Care

## 2021-10-24 NOTE — Telephone Encounter (Signed)
Pt called stating she was suppose to let [facility] know when her blood sugar glucose meter arrives so it can be set up & adjusted. Pt stated she has received the meter. Call back # [phone]

## 2021-10-25 NOTE — Telephone Encounter (Signed)
Called patient she was told that pharmacist will set up appointment with her. Let her know I was sorry for the delay but message was routed to wrong person that I will send to [facility] for review and she will reach out to her.

## 2021-10-26 NOTE — Telephone Encounter (Signed)
Contacted the patient and scheduled appointment in office for CGM training. Reminded the patient to bring all supplies to appointment.  Charlene Brooke, CPP notified  Avel Sensor, [place]  [phone]

## 2021-10-30 DIAGNOSIS — H43813 Vitreous degeneration, bilateral: Secondary | ICD-10-CM | POA: Diagnosis not present

## 2021-10-30 DIAGNOSIS — E113313 Type 2 diabetes mellitus with moderate nonproliferative diabetic retinopathy with macular edema, bilateral: Secondary | ICD-10-CM | POA: Diagnosis not present

## 2021-10-30 DIAGNOSIS — H35373 Puckering of macula, bilateral: Secondary | ICD-10-CM | POA: Diagnosis not present

## 2021-10-30 DIAGNOSIS — H31093 Other chorioretinal scars, bilateral: Secondary | ICD-10-CM | POA: Diagnosis not present

## 2021-10-31 NOTE — Telephone Encounter (Signed)
Patient would like a phone call to reschedule appointment that she originally had on tomorrow 11/01/2021,that she canceled.

## 2021-11-01 ENCOUNTER — Ambulatory Visit: Payer: Medicare HMO

## 2021-11-03 ENCOUNTER — Ambulatory Visit: Payer: Medicare HMO | Admitting: Pharmacist

## 2021-11-03 DIAGNOSIS — E785 Hyperlipidemia, unspecified: Secondary | ICD-10-CM

## 2021-11-03 DIAGNOSIS — E11319 Type 2 diabetes mellitus with unspecified diabetic retinopathy without macular edema: Secondary | ICD-10-CM

## 2021-11-03 NOTE — Patient Instructions (Addendum)
Visit Information  Phone number for Pharmacist: [phone]   Goals Addressed   None     Care Plan : [place]  Updates made by Charlton Haws, RPH since 11/03/2021 12:00 AM     Problem: Hypertension, Hyperlipidemia, and Diabetes      Long-Range Goal: Disease mgmt   Start Date: 10/11/2021  Expected End Date: 10/12/2022  This Visit's Progress: On track  Recent Progress: On track  Priority: High  Note:   Current Barriers:  Unable to maintain control of diabetes  Pharmacist Clinical Goal(s):  Patient will achieve improvement in DM as evidenced by A1c < 8% adhere to prescribed medication regimen as evidenced by patient report through collaboration with PharmD and provider.   Interventions: 1:1 collaboration with Pleas Koch, NP regarding development and update of comprehensive plan of care as evidenced by provider attestation and co-signature Inter-disciplinary care team collaboration (see longitudinal plan of care) Comprehensive medication review performed; medication list updated in electronic medical record  Hypertension (BP goal <140/90) -Controlled - BP at goal in recent clinic visits; pt brought BP monitor to office, verified accuracy with manual BP -Current treatment: HCTZ 12.5 mg daily - not taking, completed course Losartan 100 mg daily - Appropriate, Effective, Safe, Accessible Metoprolol succinate 50 mg - 1 AM, 1/2 PM -Appropriate, Effective, Safe, Accessible -Medications previously tried: HCTZ  -Current home readings: none available -Denies hypotensive/hypertensive symptoms -Educated on BP goals and benefits of medications for prevention of heart attack, stroke and kidney damage; Proper BP monitoring technique; -Counseled to monitor BP at home daily -Recommended to continue current medication  Hyperlipidemia: (LDL goal < 70) -Query Controlled - LDL 104 (03/2021) off of rosuvastatin, she restarted rosuvastatin earlier this year and does  report compliance but did not bring bottle to appt today;  -per chart review, ezetimibe was added in June per cardiology, not realizing recent LDL represented time off of statin -CAD - HX CABG 2009 -Current treatment: Rosuvastatin 10 mg daily - Appropriate, Query Effective Ezetimibe 10 mg daily - Query appropriate Aspirin 81 mg daily -Medications previously tried: n/a  -Educated on Cholesterol goals; Benefits of statin for ASCVD risk reduction; -Recommended to continue current medication; repeat lipid panel at next OV; consider discontinuing ezetimibe if LDL is < 55 as it may not be necessary  Diabetes (A1c goal <8%) -Uncontrolled - A1c 7.9% (09/2021) above goal; pt reports improvement since switching Levemir/Novolog to [facility]; she is initially hesitant to start [facility] today but eventually agreed and completed CGM training -Dx 1990s, insulin-dependent for years -Current home glucose readings: 14-day avg 159 Date AM Noon PM  10/20 147 10/19 167 246 156 10/18 130 116 140 10/17 189 227 212 10/16 151 243 277 10/15 144 10/14 129 10/13 184 -Current medications: Soliqua 20 units daily -  Appropriate, Effective, Safe, Accessible Freestyle Libre 3 w/ reader ([place])  -Medications previously tried: glipizide, metformin, Ozempic, Levemir, Novolog -Current meal patterns:  drinks: cranberry juice, apple juice - discussed high sugar content in juices -Current exercise: limited -Educated on A1c and blood sugar goals;Prevention and management of hypoglycemic episodes; Continuous glucose monitoring -Completed Freestyle Libre 3 training today; return 2-3 weeks for CGM download  Health Maintenance -Vaccine gaps: PPSV or PCV20; Shingrix, TDAP   Patient Goals/Self-Care Activities Patient will:  - take medications as prescribed as evidenced by patient report and record review focus on medication adherence by routine check glucose as directed, document, and provide at future  appointments check blood pressure daily, document,  and provide at future appointments      The patient verbalized understanding of instructions, educational materials, and care plan provided today and DECLINED offer to receive copy of patient instructions, educational materials, and care plan.  Telephone follow up appointment with pharmacy team member scheduled for: 3 weeks  Al Corpus, PharmD, BCACP Clinical Pharmacist [facility] [phone]

## 2021-11-03 NOTE — Progress Notes (Signed)
Chronic Care Management Pharmacy Note  11/03/2021 Name:  Laura Ferguson MRN:  [id number] DOB:  [birth year]  Summary: CCM F/U visit -Patient presented for [facility] 3 training. She will use reader device. -DM: A1c 7.9% (09/2021); pt endorses improvement since switching Levemir/Novolog to [facility]; she is checking BG 1-3 times daily and 14-day avg BG is 159: Date AM Noon PM  10/20 147 10/19 167 246 156 10/18 130 116 140 10/17 189 227 212 10/16 151 243 277 10/15 144 10/14 129 10/13 184  Recommendations/Changes made from today's visit: -No mec changes; f/u 2-3 weeks for CGM download  Plan: -Pharmacist F/u visit 11/24/21    Subjective: Laura Ferguson is an 88 y.o. year old female who is a primary patient of Pleas Koch, NP.  The CCM team was consulted for assistance with disease management and care coordination needs.    Engaged with patient face to face for follow up visit in response to provider referral for pharmacy case management and/or care coordination services.   Consent to Services:  The patient was given information about Chronic Care Management services, agreed to services, and gave verbal consent prior to initiation of services.  Please see initial visit note for detailed documentation.   Patient Care Team: Pleas Koch, NP as PCP - General (Internal Medicine) Troy Sine, MD as PCP - Cardiology (Cardiology) Charlton Haws, [facility] as Pharmacist (Pharmacist)  Social: She had 8 children, 6 boys and 2 girls.  She just buried her last son several months ago.  She has 2 living daughters  Recent office visits: 10/12/21 NP Allie Bossier OV: f/u DM - A1c 7.9%; switch insulin (d/t noncompliance, fear of hypoglycemia) to [facility] 20 units daily. Hold Novolog.   09/28/21 Dr Einar Pheasant OV: A1c 8.9%; poor adherence to insulin, poor dietary intake; interested in CGM - fear of lows. May benefit from simplified insulin regimen. Referred to CCM.  09/06/21 Alma Friendly, NP Telephone "Her kidney function has improved according to her recent lab draw.  No changes recommended"  06/09/21 Alma Friendly, NP Chronic bilateral low back pain without sciatica Tylenol PRN for back/hip pain   Recent consult visits: 07/11/21 Dr Claiborne Billings (Cardiology): CAD; LDL increased from 55 to 104 with switch from rosuvastatin to pravastatin. Start Ezetimibe 10 mg  06/27/21 Princess Bruins (Ophthalmology) Puckering of macula No other information 06/19/21 Carloyn Manner (Otolaryngology) Dizziness and giddiness No other information 07/09/21 Clayborne Dana (Optometry) Eye Exam No other information  06/01/21 Dr Cruzita Lederer (Endocrine):  DM A1c 8.9%; Increase Levemir to 22 units AM, 10 units PM. Novolog 6-12 units before meals.  05/29/21 Princess Bruins (Ophthalmology)  05/01/21 Princess Bruins (Ophthalmology)   Hospital visits: None in previous 6 months   Objective:  Lab Results  Component Value Date   CREATININE 1.33 (H) 09/28/2021   BUN 20 09/28/2021   GFR 36.03 (L) 09/28/2021   EGFR 41 (L) 07/14/2020   GFRNONAA 32 (L) 11/05/2018   GFRAA 38 (L) 11/05/2018   NA 142 09/28/2021   K 4.1 09/28/2021   CALCIUM 9.3 09/28/2021   CO2 26 09/28/2021   GLUCOSE 256 (H) 09/28/2021    Lab Results  Component Value Date/Time   HGBA1C 7.9 (A) 10/12/2021 09:08 AM   HGBA1C 8.9 (A) 06/01/2021 10:25 AM   HGBA1C 8.3 (H) 07/14/2020 10:19 AM   HGBA1C 11.7 (H) 04/02/2016 08:49 AM   GFR 36.03 (L) 09/28/2021 09:57 AM   GFR 33.42 (L) 04/06/2021 11:11 AM   MICROALBUR 15.8 (H)  06/27/2015 12:10 PM    Last diabetic Eye exam:  Lab Results  Component Value Date/Time   HMDIABEYEEXA Retinopathy (A) 09/14/2020 12:00 AM    Last diabetic Foot exam: No results found for: "HMDIABFOOTEX"   Lab Results  Component Value Date   CHOL 194 04/06/2021   HDL 65.80 04/06/2021   LDLCALC 104 (H) 04/06/2021   TRIG 121.0 04/06/2021   CHOLHDL 3 04/06/2021       Latest Ref Rng & Units 04/06/2021   11:11 AM  07/14/2020   10:19 AM 09/07/2019    8:38 AM  Hepatic Function  Total Protein 6.0 - 8.3 g/dL 7.0  7.4  6.9   Albumin 3.5 - 5.2 g/dL 4.1  4.6  3.9   AST 0 - 37 U/L _0 ALT 0 - 35 U/L _1 Alk Phosphatase 39 - 117 U/L 91  91  65   Total Bilirubin 0.2 - 1.2 mg/dL 0.6  0.5  0.5     Lab Results  Component Value Date/Time   TSH 0.565 07/14/2020 10:19 AM   TSH 0.52 03/02/2020 12:57 PM       Latest Ref Rng & Units 07/14/2020   10:19 AM 03/02/2020   12:57 PM 11/05/2018    6:32 PM  CBC  WBC 3.4 - 10.8 x10E3/uL 5.5  4.9  4.9   Hemoglobin 11.1 - 15.9 g/dL 12.8  12.2  12.2   Hematocrit 34.0 - 46.6 % 38.0  36.5  36.9   Platelets 150 - 450 x10E3/uL 180  176.0  166     No results found for: "VD25OH"  Clinical ASCVD: Yes  The ASCVD Risk score (Arnett DK, et al., 2019) failed to calculate for the following reasons:   The 2019 ASCVD risk score is only valid for ages 66 to 1       10/12/2021    9:00 AM 02/08/2021    8:20 AM 10/13/2019    2:05 PM  Depression screen PHQ 2/9  Decreased Interest 3 0 2  Down, Depressed, Hopeless _2 PHQ - 2 Score _3 Altered sleeping 0  0  Tired, decreased energy 0  0  Change in appetite 1  0  Feeling bad or failure about yourself  1  3  Trouble concentrating 0  0  Moving slowly or fidgety/restless 0  0  Suicidal thoughts 0  0  PHQ-9 Score 8  8  Difficult doing work/chores Not difficult at all  Not difficult at all    Social History   Tobacco Use  Smoking Status Never  Smokeless Tobacco Never  Tobacco Comments   quit 1960's  smoked very lightly.   BP Readings from Last 3 Encounters:  10/12/21 128/64  09/28/21 (!) 140/50  07/11/21 120/70   Pulse Readings from Last 3 Encounters:  10/12/21 65  09/28/21 70  07/11/21 72   Wt Readings from Last 3 Encounters:  10/12/21 128 lb (58.1 kg)  09/28/21 127 lb 6 oz (57.8 kg)  07/11/21 128 lb 6.4 oz (58.2 kg)   BMI Readings from Last 3 Encounters:  10/12/21 22.67 kg/m  09/28/21  22.56 kg/m  07/11/21 22.75 kg/m    Assessment/Interventions: Review of patient past medical history, allergies, medications, health status, including review of consultants reports, laboratory and other test data, was performed as part of comprehensive evaluation and provision of chronic care management services.   SDOH:  (Social Determinants of  Health) assessments and interventions performed: Yes SDOH Interventions    Flowsheet Row Chronic Care Management from 10/11/2021 in [place] at [facility] Visit from 10/13/2019 in [place] at [facility] Visit from 09/22/2019 in [place] at [place] from 05/19/2019 in [place] at [facility]  SDOH Interventions      Transportation Interventions Intervention Not Indicated -- -- --  Depression Interventions/Treatment  -- Counseling Medication PHQ2-9 Score <4 Follow-up Not Indicated  Financial Strain Interventions Intervention Not Indicated -- -- --      SDOH Screenings   Food Insecurity: No Food Insecurity (02/08/2021)  Housing: Low Risk  (02/08/2021)  Transportation Needs: No Transportation Needs (02/08/2021)  Alcohol Screen: Low Risk  (02/08/2021)  Depression (PHQ2-9): Medium Risk (10/12/2021)  Financial Resource Strain: Low Risk  (02/08/2021)  Physical Activity: Inactive (02/08/2021)  Social Connections: Moderately Isolated (02/08/2021)  Stress: No Stress Concern Present (02/08/2021)  Tobacco Use: Low Risk  (10/12/2021)    [place]  No Known Allergies  Medications Reviewed Today     Reviewed by Charlton Haws, [facility] (Pharmacist) on 11/03/21 at 1510  Med List Status: <None>   Medication Order Taking? Sig Documenting Provider Last Dose Status Informant  Acetaminophen (TYLENOL 8 HOUR PO) [id number] Yes Take 1 tablet by mouth every 8 (eight) hours as needed.  [provider] Taking Active Self  aspirin 81 MG tablet [id number] Yes Take 81 mg by mouth daily.  [provider] Taking Active Self  Azelastine HCl 137 MCG/SPRAY SOLN [id number] Yes As needed [provider] Taking Active   Blood Glucose Monitoring Suppl (ACCU-CHEK GUIDE) w/Device KIT [id number] Yes Use as instructed to check blood sugar. Philemon Kingdom, MD Taking Active   Cholecalciferol (VITAMIN D) 50 MCG (2000 UT) CAPS [id number] Yes Take 1 capsule by mouth daily. [provider] Taking Active   Continuous Blood Gluc Sensor (FREESTYLE LIBRE 3 SENSOR) [place] [id number] Yes by Does not apply route. [provider] Taking Active   ezetimibe (ZETIA) 10 MG tablet [id number] Yes Take 1 tablet (10 mg total) by mouth daily. Troy Sine, MD Taking Active   fexofenadine [facility]) 180 MG tablet [id number] Yes Take 180 mg by mouth daily. [provider] Taking Active   glucose blood (ACCU-CHEK GUIDE) test strip [id number] Yes USE TO CHECK SUGAR THREE TIMES DAILY AS DIRECTED Philemon Kingdom, MD Taking Active   Insulin Glargine-Lixisenatide [facility]) 100-33 UNT-MCG/ML SOPN [id number] Yes Inject 20 Units into the skin daily. For diabetes. Pleas Koch, NP Taking Active   Insulin Pen Needle (BD PEN NEEDLE NANO 2ND GEN) 32G X 4 MM MISC [id number] Yes USE WITH INSULIN PEN 4 TIMES DAILY. Philemon Kingdom, MD Taking Active   losartan (COZAAR) 100 MG tablet [id number] Yes Take 1 tablet by mouth once daily Troy Sine, MD Taking Active   metoprolol succinate (TOPROL-XL) 50 MG 24 hr tablet [id number] Yes TAKE 1 TABLET BY MOUTH IN THE MORNING AND 1/2 (ONE-HALF)  TAB IN THE Marcello Fennel, MD Taking Active   Multiple Vitamin (MULTIVITAMIN) capsule [id number] Yes Take 1 capsule by mouth daily. [provider] Taking Active Self  pantoprazole (PROTONIX) 40 MG tablet [id number] Yes Take 1 tablet by mouth once daily Troy Sine, MD Taking Active   Polyethyl Glycol-Propyl Glycol [facility]) [id number] Yes Apply 2 drops to eye 2 (two) times daily.   [provider] Taking Active Self  rosuvastatin (CRESTOR) 10 MG tablet [id number] Yes Take 1  tablet (10 mg total) by mouth daily. for cholesterol. Pleas Koch, NP Taking Active   TRAVATAN Z 0.004 % SOLN ophthalmic solution [id number] Yes Place 1 drop into both eyes at bedtime. [provider] Taking Active Self           Med Note Foye Spurling Dec 05, 2014  2:09 PM)              Patient Active Problem List   Diagnosis Date Noted   Myalgia 08/04/2020   Fatigue 03/02/2020   Preventative health care 10/13/2019   Other social stressor 08/31/2019   Acute left ankle pain 03/09/2019   Acute pain of left foot 03/09/2019   Type 2 diabetes mellitus with retinopathy ([place]) 01/29/2018   Chronic back pain 11/12/2017   Anxiety and depression 10/16/2017   Ankle swelling 06/01/2015   Dizziness 03/26/2015   CAD (coronary artery disease) 12/29/2012   HTN (hypertension) 12/29/2012   Hyperlipidemia 12/29/2012    Immunization History  Administered Date(s) Administered   Fluad Quad(high Dose 65+) 10/16/2018, 10/13/2019, 09/28/2021   Influenza,inj,Quad PF,6+ Mos 09/25/2017   PFIZER Comirnaty(Gray Top)Covid-19 Tri-Sucrose Vaccine 07/05/2020   PFIZER(Purple Top)SARS-COV-2 Vaccination 03/15/2019, 04/04/2019, 09/15/2019   Pfizer Covid-19 Vaccine Bivalent Booster 25yr & up 01/02/2021   Pneumococcal Conjugate-13 10/13/2019   Pneumococcal Polysaccharide-23 10/12/2021    Conditions to be addressed/monitored:  Hypertension, Hyperlipidemia, and Diabetes  Care Plan : CAnselmo Updates made by FCharlton Haws RDentsvillesince 11/03/2021 12:00 AM     Problem: Hypertension, Hyperlipidemia, and Diabetes      Long-Range Goal: Disease mgmt   Start Date: 10/11/2021  Expected End Date: 10/12/2022  This Visit's Progress: On track  Recent Progress: On track  Priority: High  Note:   Current Barriers:  Unable to maintain control of diabetes  Pharmacist  Clinical Goal(s):  Patient will achieve improvement in DM as evidenced by A1c < 8% adhere to prescribed medication regimen as evidenced by patient report through collaboration with PharmD and provider.   Interventions: 1:1 collaboration with CPleas Koch NP regarding development and update of comprehensive plan of care as evidenced by provider attestation and co-signature Inter-disciplinary care team collaboration (see longitudinal plan of care) Comprehensive medication review performed; medication list updated in electronic medical record  Hypertension (BP goal <140/90) -Controlled - BP at goal in recent clinic visits; pt brought BP monitor to office, verified accuracy with manual BP -Current treatment: HCTZ 12.5 mg daily - not taking, completed course Losartan 100 mg daily - Appropriate, Effective, Safe, Accessible Metoprolol succinate 50 mg - 1 AM, 1/2 PM -Appropriate, Effective, Safe, Accessible -Medications previously tried: HCTZ  -Current home readings: none available -Denies hypotensive/hypertensive symptoms -Educated on BP goals and benefits of medications for prevention of heart attack, stroke and kidney damage; Proper BP monitoring technique; -Counseled to monitor BP at home daily -Recommended to continue current medication  Hyperlipidemia: (LDL goal < 70) -Query Controlled - LDL 104 (03/2021) off of rosuvastatin, she restarted rosuvastatin earlier this year and does report compliance but did not bring bottle to appt today;  -per chart review, ezetimibe was added in June per cardiology, not realizing recent LDL represented time off of statin -CAD - HX CABG 2009 -Current treatment: Rosuvastatin 10 mg daily - Appropriate, Query Effective Ezetimibe 10 mg daily - Query appropriate Aspirin 81 mg daily -Medications previously tried: n/a  -Educated on Cholesterol goals; Benefits of statin for ASCVD risk reduction; -Recommended to continue current medication;  repeat lipid  panel at next OV; consider discontinuing ezetimibe if LDL is < 55 as it may not be necessary  Diabetes (A1c goal <8%) -Uncontrolled - A1c 7.9% (09/2021) above goal; pt reports improvement since switching Levemir/Novolog to [facility]; she is initially hesitant to start [facility] today but eventually agreed and completed CGM training -Dx 1990s, insulin-dependent for years -Current home glucose readings: 14-day avg 159 Date AM Noon PM  10/20 147 10/19 167 246 156 10/18 130 116 140 10/17 189 227 212 10/16 151 243 277 10/15 144 10/14 129 10/13 184 -Current medications: Soliqua 20 units daily -  Appropriate, Effective, Safe, Accessible Freestyle Libre 3 w/ reader ([place])  -Medications previously tried: glipizide, metformin, Ozempic, Levemir, Novolog -Current meal patterns:  drinks: cranberry juice, apple juice - discussed high sugar content in juices -Current exercise: limited -Educated on A1c and blood sugar goals;Prevention and management of hypoglycemic episodes; Continuous glucose monitoring -Completed Freestyle Libre 3 training today; return 2-3 weeks for CGM download  Health Maintenance -Vaccine gaps: PPSV or PCV20; Shingrix, TDAP   Patient Goals/Self-Care Activities Patient will:  - take medications as prescribed as evidenced by patient report and record review focus on medication adherence by routine check glucose as directed, document, and provide at future appointments check blood pressure daily, document, and provide at future appointments       Medication Assistance: None required.  Patient affirms current coverage meets needs.  Compliance/Adherence/Medication fill history: Care Gaps: DEXA scan  Star-Rating Drugs: Rosuvastatin - PDC 100% Losartan - PDC 99%  Medication Access: Within the past 30 days, how often has patient missed a dose of medication? 1 Is a pillbox or other method used to improve adherence? No  Factors that may affect medication adherence?  lack of understanding of disease management Are meds synced by current pharmacy? No  Are meds delivered by current pharmacy? No  Does patient experience delays in picking up medications due to transportation concerns? No   [facility] Services Reviewed: Is patient disadvantaged to use [facility]?: No  Current Rx insurance plan: [employer] Name and location of Current pharmacy:  [facility] [address], [place] - [place] [address] Phone: [phone] Fax: [phone]  [facility] services reviewed with patient today?: No  Patient requests to transfer care to [facility] Pharm[facility]?: No  Reason patient declined to change pharmacies: Not mentioned at this visit   Care Plan and Follow Up Patient Decision:  Patient agrees to Care Plan and Follow-up.  Plan: Face to Face appointment with care management team member scheduled for: 3 weeks  Charlene Brooke, PharmD, BCACP Clinical Pharmacist [facility] [phone]

## 2021-11-05 ENCOUNTER — Other Ambulatory Visit: Payer: Self-pay | Admitting: Cardiovascular Disease

## 2021-11-13 ENCOUNTER — Ambulatory Visit: Payer: Self-pay

## 2021-11-13 DIAGNOSIS — E113311 Type 2 diabetes mellitus with moderate nonproliferative diabetic retinopathy with macular edema, right eye: Secondary | ICD-10-CM | POA: Diagnosis not present

## 2021-11-13 DIAGNOSIS — E11319 Type 2 diabetes mellitus with unspecified diabetic retinopathy without macular edema: Secondary | ICD-10-CM

## 2021-11-13 NOTE — Chronic Care Management (AMB) (Signed)
Please Disregard 

## 2021-11-15 NOTE — Telephone Encounter (Signed)
Patient called in with questions regarding her monitor. She stated that the monitor is hanging off and not sure what to do. She would like for someone to give her a call at [phone]. Thank you!

## 2021-11-15 NOTE — Telephone Encounter (Signed)
Spoke with patient, she was having trouble connecting new Libre sensor. She has applied sensor but cannot connect reader. Explained steps to connect reader over the phone and patient was able to connect. Pt has appt next week with PharmD to download CGM data. Nothing further needed.

## 2021-11-20 DIAGNOSIS — Z794 Long term (current) use of insulin: Secondary | ICD-10-CM | POA: Diagnosis not present

## 2021-11-20 DIAGNOSIS — E1165 Type 2 diabetes mellitus with hyperglycemia: Secondary | ICD-10-CM | POA: Diagnosis not present

## 2021-11-21 ENCOUNTER — Telehealth: Payer: Self-pay

## 2021-11-21 NOTE — Progress Notes (Signed)
    Chronic Care Management Pharmacy Assistant   Name: MAXIMINA PIROZZI  MRN: [id number] DOB: 1932-10-30  Reason for Encounter: CCM (Appointment Reminder)  Medications: Outpatient Encounter Medications as of 11/21/2021  Medication Sig   Acetaminophen (TYLENOL 8 HOUR PO) Take 1 tablet by mouth every 8 (eight) hours as needed.    aspirin 81 MG tablet Take 81 mg by mouth daily.   Azelastine HCl 137 MCG/SPRAY SOLN As needed   Blood Glucose Monitoring Suppl (ACCU-CHEK GUIDE) w/Device KIT Use as instructed to check blood sugar.   Cholecalciferol (VITAMIN D) 50 MCG (2000 UT) CAPS Take 1 capsule by mouth daily.   Continuous Blood Gluc Sensor (FREESTYLE LIBRE 3 SENSOR) MISC by Does not apply route.   ezetimibe (ZETIA) 10 MG tablet Take 1 tablet (10 mg total) by mouth daily.   fexofenadine (ALLEGRA) 180 MG tablet Take 180 mg by mouth daily.   glucose blood (ACCU-CHEK GUIDE) test strip USE TO CHECK SUGAR THREE TIMES DAILY AS DIRECTED   Insulin Glargine-Lixisenatide (SOLIQUA) 100-33 UNT-MCG/ML SOPN Inject 20 Units into the skin daily. For diabetes.   Insulin Pen Needle (BD PEN NEEDLE NANO 2ND GEN) 32G X 4 MM MISC USE WITH INSULIN PEN 4 TIMES DAILY.   losartan (COZAAR) 100 MG tablet Take 1 tablet by mouth once daily   metoprolol succinate (TOPROL-XL) 50 MG 24 hr tablet TAKE 1 TABLET BY MOUTH IN THE MORNING AND 1/2 (ONE-HALF)  TAB IN THE EVENING   Multiple Vitamin (MULTIVITAMIN) capsule Take 1 capsule by mouth daily.   pantoprazole (PROTONIX) 40 MG tablet Take 1 tablet by mouth once daily   Polyethyl Glycol-Propyl Glycol (SYSTANE OP) Apply 2 drops to eye 2 (two) times daily.    rosuvastatin (CRESTOR) 10 MG tablet Take 1 tablet (10 mg total) by mouth daily. for cholesterol.   TRAVATAN Z 0.004 % SOLN ophthalmic solution Place 1 drop into both eyes at bedtime.   No facility-administered encounter medications on file as of 11/21/2021.   ZHANNA MELIN was contacted to remind of upcoming office visit with  Charlene Brooke on 11/24/2021 at 2:00. Patient was reminded to have any blood glucose and blood pressure readings available for review at appointment.   Message was left reminding patient of appointment.  CCM referral has been placed prior to visit?  Yes   Star Rating Drugs: Medication:  Last Fill: Day Supply Losartan 100 mg 09/22/2021 90 Rosuvastatin 10 mg 09/22/2021 [place], CPP notified  Marijean Niemann, [place] Clinical Pharmacy Assistant [phone]

## 2021-11-24 ENCOUNTER — Ambulatory Visit (INDEPENDENT_AMBULATORY_CARE_PROVIDER_SITE_OTHER): Payer: Medicare HMO | Admitting: Pharmacist

## 2021-11-24 ENCOUNTER — Telehealth: Payer: Self-pay | Admitting: Pharmacist

## 2021-11-24 DIAGNOSIS — E785 Hyperlipidemia, unspecified: Secondary | ICD-10-CM

## 2021-11-24 DIAGNOSIS — E11319 Type 2 diabetes mellitus with unspecified diabetic retinopathy without macular edema: Secondary | ICD-10-CM

## 2021-11-24 DIAGNOSIS — E1122 Type 2 diabetes mellitus with diabetic chronic kidney disease: Secondary | ICD-10-CM

## 2021-11-24 MED ORDER — SOLIQUA 100-33 UNT-MCG/ML ~~LOC~~ SOPN: 22.0000 [IU] | PEN_INJECTOR | Freq: Every day | SUBCUTANEOUS | 0 refills | Status: DC

## 2021-11-24 NOTE — Patient Instructions (Addendum)
Visit Information  Phone number for Pharmacist: [phone]   Goals Addressed   None     Care Plan : CCM Pharmacy Care Plan  Updates made by Laura Ferguson, RPH since 11/24/2021 12:00 AM     Problem: Hypertension, Hyperlipidemia, and Diabetes, CKD      Long-Range Goal: Disease mgmt   Start Date: 10/11/2021  Expected End Date: 10/12/2022  This Visit's Progress: On track  Recent Progress: On track  Priority: High  Note:   Current Barriers:  Unable to maintain control of diabetes  Pharmacist Clinical Goal(s):  Patient will achieve improvement in DM as evidenced by A1c < 8% adhere to prescribed medication regimen as evidenced by patient report through collaboration with PharmD and provider.   Interventions: 1:1 collaboration with Laura Nest, NP regarding development and update of comprehensive plan of care as evidenced by provider attestation and co-signature Inter-disciplinary care team collaboration (see longitudinal plan of care) Comprehensive medication review performed; medication list updated in electronic medical record  Hypertension (BP goal <140/90) -Controlled - BP at goal in recent clinic visits; pt brought BP monitor to office, verified accuracy with manual BP -Current treatment: Losartan 100 mg daily - Appropriate, Effective, Safe, Accessible Metoprolol succinate 50 mg - 1 AM, 1/2 PM -Appropriate, Effective, Safe, Accessible -Medications previously tried: HCTZ  -Current home readings: none available -Denies hypotensive/hypertensive symptoms -Educated on BP goals and benefits of medications for prevention of heart attack, stroke and kidney damage; Proper BP monitoring technique; -Counseled to monitor BP at home daily -Recommended to continue current medication  Hyperlipidemia: (LDL goal < 70) -Query Controlled - LDL 104 (03/2021) off of rosuvastatin, she restarted rosuvastatin earlier this year and does report compliance but did not bring bottle to  appt today;  -per chart review, ezetimibe was added in June per cardiology, not realizing recent LDL represented time off of statin -CAD - HX CABG 2009 -Current treatment: Rosuvastatin 10 mg daily - Appropriate, Query Effective Ezetimibe 10 mg daily - Query appropriate Aspirin 81 mg daily -Medications previously tried: n/a  -Educated on Cholesterol goals; Benefits of statin for ASCVD risk reduction; -Recommended to continue current medication; repeat lipid panel at next OV; consider discontinuing ezetimibe if LDL is < 55 as it may not be necessary  Diabetes (A1c goal <8%) -Controlled - A1c 7.9% (09/2021) reasonable, however pt was having lows and issues with complicated insulin regimen at that time; pt reports improvement since switching Levemir/Novolog to [facility]; she has been wearing [name] and bring meter for download today -Dx 1990s, insulin-dependent for years -Current home glucose readings:  Reviewed AGP report: 11/11/21 to 11/24/21. Sensor active: 96%  Time in range (70-180): 52% (goal > 70%)  High (180-250): 31%  Very high (>250): 17%  Low (< 70): 0% (goal < 4%)  GMI: 7.8%; Average glucose: 188  -Current medications: Soliqua 20 units daily -  Appropriate, Effective, Safe, Accessible Freestyle Libre 3 w/ reader (TMS)  -Medications previously tried: glipizide, metformin, Ozempic, Levemir, Novolog -Current meal patterns:  drinks: cranberry juice, apple juice - discussed high sugar content in juices -Current exercise: limited -Educated on A1c and blood sugar goals;Prevention and management of hypoglycemic episodes; Continuous glucose monitoring -Reviewed AGP report with patient - she has a pattern of post-prandial high glucose with peak around midday, reviewed food choices to help limit these peaks -Recommend to continue current medication; keep endocrine appt 11/30  Chronic Kidney Disease Stage 3b  -All medications assessed for renal dosing and appropriateness in  chronic  kidney disease. -Recommended to continue current medication  Health Maintenance -Vaccine gaps: PPSV or PCV20; Shingrix, TDAP   Patient Goals/Self-Care Activities Patient will:  - take medications as prescribed as evidenced by patient report and record review focus on medication adherence by routine check glucose as directed, document, and provide at future appointments check blood pressure daily, document, and provide at future appointments      The patient verbalized understanding of instructions, educational materials, and care plan provided today and DECLINED offer to receive copy of patient instructions, educational materials, and care plan.  Telephone follow up appointment with pharmacy team member scheduled for: PRN  Al Corpus, PharmD, BCACP Clinical Pharmacist [place] [facility] [phone]

## 2021-11-24 NOTE — Telephone Encounter (Signed)
Does patient still plan on following with endocrinology? If so, please have her endocrinology regarding the change of her regimen.  If not, then she needs follow up visit with me in early January 2024 for diabetes check.

## 2021-11-24 NOTE — Telephone Encounter (Signed)
Patient requests a refill of Soliqua to [place]. Routing to PCP pool   [facility] [address] [place], [place] - [address] [place] [address] Phone: [phone] Fax: [phone]

## 2021-11-24 NOTE — Progress Notes (Signed)
Chronic Care Management Pharmacy Note  11/24/2021 Name:  Laura Ferguson MRN:  [id number] DOB:  Nov 17, 1932  Summary: CCM F/U visit -DM: A1c 7.9% (09/2021); pt endorses improvement since switching Levemir/Novolog to [facility]; she has enjoyed [employer] - brought reader for download today -Reviewed AGP report: 11/11/21 to 11/24/21. Sensor active: 96%  Time in range (70-180): 52% (goal > 70%)  High (180-250): 31%  Very high (>250): 17%  Low (< 70): 0% (goal < 4%)  GMI: 7.8%; Average glucose: 188 -Pt has a pattern of postprandial highs around midday  Recommendations/Changes made from today's visit: -No mec changes; F/U with endocrine as scheduled -Discussed food choices to limit post-prandial highs  Plan: -Pharmacist follow up televisit PRN -Endocrine appt 12/14/21    Subjective: Laura Ferguson is an 88 y.o. year old female who is a primary patient of Laura Koch, NP.  The CCM team was consulted for assistance with disease management and care coordination needs.    Engaged with patient face to face for follow up visit in response to provider referral for pharmacy case management and/or care coordination services.   Consent to Services:  The patient was given information about Chronic Care Management services, agreed to services, and gave verbal consent prior to initiation of services.  Please see initial visit note for detailed documentation.   Patient Care Team: Laura Koch, NP as PCP - General (Internal Medicine) Troy Sine, MD as PCP - Cardiology (Cardiology) Charlton Haws, [facility] as Pharmacist (Pharmacist)  Social: She had 8 children, 6 boys and 2 girls.  She just buried her last son several months ago.  She has 2 living daughters  Recent office visits: 10/12/21 NP Allie Bossier OV: f/u DM - A1c 7.9%; switch insulin (d/t noncompliance, fear of hypoglycemia) to [facility] 20 units daily. Hold Novolog.   09/28/21 Dr Einar Pheasant OV: A1c 8.9%; poor adherence to  insulin, poor dietary intake; interested in CGM - fear of lows. May benefit from simplified insulin regimen. Referred to CCM.  09/06/21 Alma Friendly, NP Telephone "Her kidney function has improved according to her recent lab draw.  No changes recommended"  06/09/21 Alma Friendly, NP Chronic bilateral low back pain without sciatica Tylenol PRN for back/hip pain   Recent consult visits: 07/11/21 Dr Claiborne Billings (Cardiology): CAD; LDL increased from 55 to 104 with switch from rosuvastatin to pravastatin. Start Ezetimibe 10 mg  06/27/21 Princess Bruins (Ophthalmology) Puckering of macula No other information 06/19/21 Carloyn Manner (Otolaryngology) Dizziness and giddiness No other information 07/09/21 Clayborne Dana (Optometry) Eye Exam No other information  06/01/21 Dr Cruzita Lederer (Endocrine):  DM A1c 8.9%; Increase Levemir to 22 units AM, 10 units PM. Novolog 6-12 units before meals.  05/29/21 Princess Bruins (Ophthalmology)  05/01/21 Princess Bruins (Ophthalmology)   Hospital visits: None in previous 6 months   Objective:  Lab Results  Component Value Date   CREATININE 1.33 (H) 09/28/2021   BUN 20 09/28/2021   GFR 36.03 (L) 09/28/2021   EGFR 41 (L) 07/14/2020   GFRNONAA 32 (L) 11/05/2018   GFRAA 38 (L) 11/05/2018   NA 142 09/28/2021   K 4.1 09/28/2021   CALCIUM 9.3 09/28/2021   CO2 26 09/28/2021   GLUCOSE 256 (H) 09/28/2021    Lab Results  Component Value Date/Time   HGBA1C 7.9 (A) 10/12/2021 09:08 AM   HGBA1C 8.9 (A) 06/01/2021 10:25 AM   HGBA1C 8.3 (H) 07/14/2020 10:19 AM   HGBA1C 11.7 (H) 04/02/2016 08:49 AM   GFR 36.03 (  L) 09/28/2021 09:57 AM   GFR 33.42 (L) 04/06/2021 11:11 AM   MICROALBUR 15.8 (H) 06/27/2015 12:10 PM    Last diabetic Eye exam:  Lab Results  Component Value Date/Time   HMDIABEYEEXA Retinopathy (A) 09/14/2020 12:00 AM    Last diabetic Foot exam: No results found for: "HMDIABFOOTEX"   Lab Results  Component Value Date   CHOL 194 04/06/2021   HDL 65.80  04/06/2021   LDLCALC 104 (H) 04/06/2021   TRIG 121.0 04/06/2021   CHOLHDL 3 04/06/2021       Latest Ref Rng & Units 04/06/2021   11:11 AM 07/14/2020   10:19 AM 09/07/2019    8:38 AM  Hepatic Function  Total Protein 6.0 - 8.3 g/dL 7.0  7.4  6.9   Albumin 3.5 - 5.2 g/dL 4.1  4.6  3.9   AST 0 - 37 U/L _0 ALT 0 - 35 U/L _1 Alk Phosphatase 39 - 117 U/L 91  91  65   Total Bilirubin 0.2 - 1.2 mg/dL 0.6  0.5  0.5     Lab Results  Component Value Date/Time   TSH 0.565 07/14/2020 10:19 AM   TSH 0.52 03/02/2020 12:57 PM       Latest Ref Rng & Units 07/14/2020   10:19 AM 03/02/2020   12:57 PM 11/05/2018    6:32 PM  CBC  WBC 3.4 - 10.8 x10E3/uL 5.5  4.9  4.9   Hemoglobin 11.1 - 15.9 g/dL 12.8  12.2  12.2   Hematocrit 34.0 - 46.6 % 38.0  36.5  36.9   Platelets 150 - 450 x10E3/uL 180  176.0  166     No results found for: "VD25OH"  Clinical ASCVD: Yes  The ASCVD Risk score (Arnett DK, et al., 2019) failed to calculate for the following reasons:   The 2019 ASCVD risk score is only valid for ages 110 to 83       10/12/2021    9:00 AM 02/08/2021    8:20 AM 10/13/2019    2:05 PM  Depression screen PHQ 2/9  Decreased Interest 3 0 2  Down, Depressed, Hopeless _2 PHQ - 2 Score _3 Altered sleeping 0  0  Tired, decreased energy 0  0  Change in appetite 1  0  Feeling bad or failure about yourself  1  3  Trouble concentrating 0  0  Moving slowly or fidgety/restless 0  0  Suicidal thoughts 0  0  PHQ-9 Score 8  8  Difficult doing work/chores Not difficult at all  Not difficult at all    Social History   Tobacco Use  Smoking Status Never  Smokeless Tobacco Never  Tobacco Comments   quit 1960's  smoked very lightly.   BP Readings from Last 3 Encounters:  10/12/21 128/64  09/28/21 (!) 140/50  07/11/21 120/70   Pulse Readings from Last 3 Encounters:  10/12/21 65  09/28/21 70  07/11/21 72   Wt Readings from Last 3 Encounters:  10/12/21 128 lb (58.1  kg)  09/28/21 127 lb 6 oz (57.8 kg)  07/11/21 128 lb 6.4 oz (58.2 kg)   BMI Readings from Last 3 Encounters:  10/12/21 22.67 kg/m  09/28/21 22.56 kg/m  07/11/21 22.75 kg/m    Assessment/Interventions: Review of patient past medical history, allergies, medications, health status, including review of consultants reports, laboratory and other test data, was performed as part  of comprehensive evaluation and provision of chronic care management services.   SDOH:  (Social Determinants of Health) assessments and interventions performed: Yes SDOH Interventions    Flowsheet Row Chronic Care Management from 10/11/2021 in [place] at [facility] Visit from 10/13/2019 in [place] at [facility] Visit from 09/22/2019 in [place] at [place] from 05/19/2019 in [place] at [place]  SDOH Interventions      Transportation Interventions Intervention Not Indicated -- -- --  Depression Interventions/Treatment  -- Counseling Medication PHQ2-9 Score <4 Follow-up Not Indicated  Financial Strain Interventions Intervention Not Indicated -- -- --      SDOH Screenings   Food Insecurity: No Food Insecurity (02/08/2021)  Housing: Low Risk  (02/08/2021)  Transportation Needs: No Transportation Needs (02/08/2021)  Alcohol Screen: Low Risk  (02/08/2021)  Depression (PHQ2-9): Medium Risk (10/12/2021)  Financial Resource Strain: Low Risk  (02/08/2021)  Physical Activity: Inactive (02/08/2021)  Social Connections: Moderately Isolated (02/08/2021)  Stress: No Stress Concern Present (02/08/2021)  Tobacco Use: Low Risk  (11/13/2021)    CCM Care Plan  No Known Allergies  Medications Reviewed Today     Reviewed by Charlton Haws, [facility] (Pharmacist) on 11/24/21 at 70  Med List Status: <None>   Medication Order Taking? Sig Documenting Provider Last Dose Status Informant  Acetaminophen (TYLENOL 8 HOUR PO) [id number] Yes Take 1 tablet by  mouth every 8 (eight) hours as needed.  [provider] Taking Active Self  aspirin 81 MG tablet [id number] Yes Take 81 mg by mouth daily. [provider] Taking Active Self  Azelastine HCl 137 MCG/SPRAY SOLN [id number] Yes As needed [provider] Taking Active   Blood Glucose Monitoring Suppl (ACCU-CHEK GUIDE) w/Device KIT [id number] Yes Use as instructed to check blood sugar. Philemon Kingdom, MD Taking Active   Cholecalciferol (VITAMIN D) 50 MCG (2000 UT) CAPS [id number] Yes Take 1 capsule by mouth daily. [provider] Taking Active   Continuous Blood Gluc Sensor (FREESTYLE LIBRE 3 SENSOR) [place] [id number] Yes by Does not apply route. [provider] Taking Active   ezetimibe (ZETIA) 10 MG tablet [id number] Yes Take 1 tablet (10 mg total) by mouth daily. Troy Sine, MD Taking Active   fexofenadine [facility]) 180 MG tablet [id number] Yes Take 180 mg by mouth daily. [provider] Taking Active   glucose blood (ACCU-CHEK GUIDE) test strip [id number] Yes USE TO CHECK SUGAR THREE TIMES DAILY AS DIRECTED Philemon Kingdom, MD Taking Active   Insulin Glargine-Lixisenatide [facility]) 100-33 UNT-MCG/ML SOPN [id number] Yes Inject 20 Units into the skin daily. For diabetes.  Patient taking differently: Inject 22 Units into the skin daily. For diabetes.   Laura Koch, NP Taking Active   Insulin Pen Needle (BD PEN NEEDLE NANO 2ND GEN) 32G X 4 MM MISC [id number] Yes USE WITH INSULIN PEN 4 TIMES DAILY. Philemon Kingdom, MD Taking Active   losartan (COZAAR) 100 MG tablet [id number] Yes Take 1 tablet by mouth once daily Troy Sine, MD Taking Active   metoprolol succinate (TOPROL-XL) 50 MG 24 hr tablet [id number] Yes TAKE 1 TABLET BY MOUTH IN THE MORNING AND 1/2 (ONE-HALF)  TAB IN THE Marcello Fennel, MD Taking Active   Multiple Vitamin (MULTIVITAMIN) capsule [id number] Yes Take 1 capsule by mouth daily. [provider] Taking Active Self   pantoprazole (PROTONIX) 40 MG tablet [id number] Yes Take 1 tablet by mouth once daily Troy Sine, MD Taking Active   Polyethyl  Glycol-Propyl Glycol (SYSTANE OP) [id number] Yes Apply 2 drops to eye 2 (two) times daily.  [provider] Taking Active Self  rosuvastatin (CRESTOR) 10 MG tablet [id number] Yes Take 1 tablet (10 mg total) by mouth daily. for cholesterol. Laura Koch, NP Taking Active   TRAVATAN Z 0.004 % SOLN ophthalmic solution [id number] Yes Place 1 drop into both eyes at bedtime. [provider] Taking Active Self           Med Note Foye Spurling Dec 05, 2014  2:09 PM)              Patient Active Problem List   Diagnosis Date Noted   Myalgia 08/04/2020   Fatigue 03/02/2020   Preventative health care 10/13/2019   Other social stressor 08/31/2019   Acute left ankle pain 03/09/2019   Acute pain of left foot 03/09/2019   Type 2 diabetes mellitus with retinopathy ([place]) 01/29/2018   Chronic back pain 11/12/2017   Anxiety and depression 10/16/2017   Ankle swelling 06/01/2015   Dizziness 03/26/2015   CAD (coronary artery disease) 12/29/2012   HTN (hypertension) 12/29/2012   Hyperlipidemia 12/29/2012    Immunization History  Administered Date(s) Administered   Fluad Quad(high Dose 65+) 10/16/2018, 10/13/2019, 09/28/2021   Influenza,inj,Quad PF,6+ Mos 09/25/2017   PFIZER Comirnaty(Gray Top)Covid-19 Tri-Sucrose Vaccine 07/05/2020   PFIZER(Purple Top)SARS-COV-2 Vaccination 03/15/2019, 04/04/2019, 09/15/2019   Pfizer Covid-19 Vaccine Bivalent Booster 66yr & up 01/02/2021   Pneumococcal Conjugate-13 10/13/2019   Pneumococcal Polysaccharide-23 10/12/2021    Conditions to be addressed/monitored:  Hypertension, Hyperlipidemia, and Diabetes, CKD  Care Plan : [place] Updates made by FCharlton Haws RAmestisince 11/24/2021 12:00 AM     Problem: Hypertension, Hyperlipidemia, and Diabetes, CKD      Long-Range Goal:  Disease mgmt   Start Date: 10/11/2021  Expected End Date: 10/12/2022  This Visit's Progress: On track  Recent Progress: On track  Priority: High  Note:   Current Barriers:  Unable to maintain control of diabetes  Pharmacist Clinical Goal(s):  Patient will achieve improvement in DM as evidenced by A1c < 8% adhere to prescribed medication regimen as evidenced by patient report through collaboration with PharmD and provider.   Interventions: 1:1 collaboration with CPleas Koch NP regarding development and update of comprehensive plan of care as evidenced by provider attestation and co-signature Inter-disciplinary care team collaboration (see longitudinal plan of care) Comprehensive medication review performed; medication list updated in electronic medical record  Hypertension (BP goal <140/90) -Controlled - BP at goal in recent clinic visits; pt brought BP monitor to office, verified accuracy with manual BP -Current treatment: Losartan 100 mg daily - Appropriate, Effective, Safe, Accessible Metoprolol succinate 50 mg - 1 AM, 1/2 PM -Appropriate, Effective, Safe, Accessible -Medications previously tried: HCTZ  -Current home readings: none available -Denies hypotensive/hypertensive symptoms -Educated on BP goals and benefits of medications for prevention of heart attack, stroke and kidney damage; Proper BP monitoring technique; -Counseled to monitor BP at home daily -Recommended to continue current medication  Hyperlipidemia: (LDL goal < 70) -Query Controlled - LDL 104 (03/2021) off of rosuvastatin, she restarted rosuvastatin earlier this year and does report compliance but did not bring bottle to appt today;  -per chart review, ezetimibe was added in June per cardiology, not realizing recent LDL represented time off of statin -CAD - HX CABG 2009 -Current treatment: Rosuvastatin 10 mg daily - Appropriate, Query Effective Ezetimibe 10 mg daily - Query appropriate  Aspirin 81 mg  daily -Medications previously tried: n/a  -Educated on Cholesterol goals; Benefits of statin for ASCVD risk reduction; -Recommended to continue current medication; repeat lipid panel at next OV; consider discontinuing ezetimibe if LDL is < 55 as it may not be necessary  Diabetes (A1c goal <8%) -Controlled - A1c 7.9% (09/2021) reasonable, however pt was having lows and issues with complicated insulin regimen at that time; pt reports improvement since switching Levemir/Novolog to [facility]; she has been wearing [employer] and bring meter for download today -Dx 1990s, insulin-dependent for years -Current home glucose readings:  Reviewed AGP report: 11/11/21 to 11/24/21. Sensor active: 96%  Time in range (70-180): 52% (goal > 70%)  High (180-250): 31%  Very high (>250): 17%  Low (< 70): 0% (goal < 4%)  GMI: 7.8%; Average glucose: 188  -Current medications: Soliqua 20 units daily -  Appropriate, Effective, Safe, Accessible Freestyle Libre 3 w/ reader ([place])  -Medications previously tried: glipizide, metformin, Ozempic, Levemir, Novolog -Current meal patterns:  drinks: cranberry juice, apple juice - discussed high sugar content in juices -Current exercise: limited -Educated on A1c and blood sugar goals;Prevention and management of hypoglycemic episodes; Continuous glucose monitoring -Reviewed AGP report with patient - she has a pattern of post-prandial high glucose with peak around midday, reviewed food choices to help limit these peaks -Recommend to continue current medication; keep endocrine appt 11/30  Chronic Kidney Disease Stage 3b  -All medications assessed for renal dosing and appropriateness in chronic kidney disease. -Recommended to continue current medication  Health Maintenance -Vaccine gaps: PPSV or PCV20; Shingrix, TDAP   Patient Goals/Self-Care Activities Patient will:  - take medications as prescribed as evidenced by patient report and record review focus on medication  adherence by routine check glucose as directed, document, and provide at future appointments check blood pressure daily, document, and provide at future appointments     Medication Assistance: None required.  Patient affirms current coverage meets needs.  Compliance/Adherence/Medication fill history: Care Gaps: DEXA scan  Star-Rating Drugs: Rosuvastatin - PDC 100% Losartan - PDC 99%  Medication Access: Within the past 30 days, how often has patient missed a dose of medication? 1 Is a pillbox or other method used to improve adherence? No  Factors that may affect medication adherence? lack of understanding of disease management Are meds synced by current pharmacy? No  Are meds delivered by current pharmacy? No  Does patient experience delays in picking up medications due to transportation concerns? No   [facility] Services Reviewed: Is patient disadvantaged to use [facility]?: No  Current Rx insurance plan: [employer] Name and location of Current pharmacy:  [facility] [address] [place] [address] Phone: [phone] Fax: [phone]  [facility] services reviewed with patient today?: No  Patient requests to transfer care to [facility]?: No  Reason patient declined to change pharmacies: Not mentioned at this visit   Care Plan and Follow Up Patient Decision:  Patient agrees to Care Plan and Follow-up.  Plan: Telephone follow up appointment with care management team member scheduled for:  PRN  Charlene Brooke, PharmD, BCACP Clinical Pharmacist [facility] [phone]

## 2021-11-28 NOTE — Telephone Encounter (Signed)
Noted, thanks!

## 2021-11-28 NOTE — Telephone Encounter (Signed)
Yes she does still follow with endo, she has appt coming up 11/30 with Dr Elvera Lennox. I had advised her to bring her Libre meter with her to the appt.

## 2021-12-14 ENCOUNTER — Encounter: Payer: Self-pay | Admitting: Internal Medicine

## 2021-12-14 ENCOUNTER — Ambulatory Visit (INDEPENDENT_AMBULATORY_CARE_PROVIDER_SITE_OTHER): Payer: Medicare HMO | Admitting: Internal Medicine

## 2021-12-14 VITALS — BP 120/74 | HR 69 | Ht 63.0 in | Wt 125.4 lb

## 2021-12-14 DIAGNOSIS — E1122 Type 2 diabetes mellitus with diabetic chronic kidney disease: Secondary | ICD-10-CM | POA: Diagnosis not present

## 2021-12-14 DIAGNOSIS — N1832 Chronic kidney disease, stage 3b: Secondary | ICD-10-CM

## 2021-12-14 DIAGNOSIS — I129 Hypertensive chronic kidney disease with stage 1 through stage 4 chronic kidney disease, or unspecified chronic kidney disease: Secondary | ICD-10-CM | POA: Diagnosis not present

## 2021-12-14 DIAGNOSIS — E785 Hyperlipidemia, unspecified: Secondary | ICD-10-CM | POA: Diagnosis not present

## 2021-12-14 DIAGNOSIS — E1159 Type 2 diabetes mellitus with other circulatory complications: Secondary | ICD-10-CM

## 2021-12-14 DIAGNOSIS — Z794 Long term (current) use of insulin: Secondary | ICD-10-CM

## 2021-12-14 LAB — POCT GLYCOSYLATED HEMOGLOBIN (HGB A1C): Hemoglobin A1C: 8.4 % — AB (ref 4.0–5.6)

## 2021-12-14 NOTE — Patient Instructions (Addendum)
Please move: - Soliqua 20-22 units before breakfast, every day  Please continue to follow-up with PCP for your diabetes.

## 2021-12-14 NOTE — Progress Notes (Signed)
Patient ID: Laura Ferguson, female   DOB: [birth year], 88 y.o.   MRN: [id number]    HPI: Laura Ferguson is a 88 y.o.-year-old female, returning for f/u for DM2, dx in 1990s, insulin-dependent for years, uncontrolled, with complications (CAD - s/p CABG x6, CKD, DR, PN). Last visit 6 months ago.  Interim hx: Before last visit, she had a lot of stress - she buried her 5th son - all her sons died in last few years. She also has 2 daughters. She continues to have blurry vision (improved)-gets intraocular injections.  No chest pain, nausea, increased urination. Since last visit, I communicated with patient's PCP and she started patient on [place] in an effort to improve her compliance and simplify her diabetic regimen. At today's visit, she is telling me that she is taking [place] seldom...  Reviewed HbA1c levels: Lab Results  Component Value Date   HGBA1C 7.9 (A) 10/12/2021   HGBA1C 8.9 (A) 06/01/2021   HGBA1C 8.1 (A) 01/26/2021   HGBA1C 7.8 (A) 09/22/2020   HGBA1C 8.3 (H) 07/14/2020   HGBA1C 7.9 (A) 05/19/2020   HGBA1C 8.4 (A) 01/20/2020   HGBA1C 8.1 (A) 09/23/2019   HGBA1C 8.5 (A) 06/18/2019   HGBA1C 7.9 (A) 02/17/2019   HGBA1C 8.4 (A) 10/16/2018   HGBA1C 8.8 (A) 06/16/2018   HGBA1C 8.9 (A) 01/29/2018   HGBA1C 9.4 (A) 10/15/2017   HGBA1C 8.5 04/18/2017   HGBA1C 9.1 01/17/2017   HGBA1C 8.8 10/16/2016   HGBA1C 11.7 (H) 04/02/2016   HGBA1C 10.0 (H) 01/03/2016   HGBA1C 9.5 (H) 09/30/2015   She is usually not following the recommended regimen despite repeated advice at every visit:  In the past, she was on: - Levemir 30 units in a.m. and 15 units units at bedtime (but skips it if sugars are at goal!!! - takes it maybe 2x a week) - Novolog 12-15 units 15 min before meals (but actually takes 12 units in am and 10 units before lunch and dinner)  Then on: - Levemir 30 units in a.m. and only occasionally, 12 units units at bedtime - Novolog 12 units 15 min before meals  Then on: - Levemir  30 units in a.m. >> not taking - Novolog 12-15 units 15 min before meals - only using 6-12 units and misses doses  Then on: - Levemir 30 units in a.m. and 10 units at bedtime >> taking 0-10 units - Novolog 12-15 units 15 min before meals >> cannot tell exactly how much she takes but apparently close to: 12 units in am  0 units before lunch 0-12 units before dinner   Then on: - Levemir 24 >> actually: 22 units in a.m. and 10 >> actually: 8 units at bedtime (EVERY NIGHT) - Novolog 6-12 >> actually: 10-12 units 15 min before meals  At last visit she was on:  - Levemir: 12-18 units (unclear) in am and 10 units at bedtime  - NovoLog: 12 (b'fast)-0 (no lunch)-6-12 (dinner)  Now on: - Soliqua 20-22 units after b'fast "very seldom" - started by PCP  She checks sugars >4 times a day:   Prev.  - am:   67, 146-243 >> 77-135, 200 >> 183-261, 436 - 2h after b'fast: 71, 96-143, 326 >> 57, 126-251 >> 251 - before lunch:  64-182, 260 >> 191-242 >> 101-398 - 2h after lunch: 81-252 >> 172-216>> 145-306 - before dinner:   98-229 >> 60-297 >> 91-356 - 2h after dinner: 130-161 >> 140-255 >> 261-335 - bedtime: n/c >>  80-178  >> n/c >> 170 - nighttime:  176, 215 >> 186 >> 160-228 >> n/c Lowest sugar was  57 >> 60s, 91; it is unclear at which level she has hypoglycemia awareness Highest sugar was 326 >> 297 >> 436.  Glucometer: AccuChek  Pt's meals are: - Breakfast: oatmeal, grits, apple sauce - Lunch: tuna sandwich, bologna - Dinner: soup, sandwich  -+ CKD, last BUN/creatinine:  Lab Results  Component Value Date   BUN 20 09/28/2021   BUN 23 04/06/2021   CREATININE 1.33 (H) 09/28/2021   CREATININE 1.42 (H) 04/06/2021  On losartan 100.  -+ HL; last set of lipids: Lab Results  Component Value Date   CHOL 194 04/06/2021   HDL 65.80 04/06/2021   LDLCALC 104 (H) 04/06/2021   TRIG 121.0 04/06/2021   CHOLHDL 3 04/06/2021  On Crestor 10.  Cardiologist: Dr. Claiborne Billings.   - last eye exam was in  2021/03/22: + DR, she gets intraocular injections. Dr. Enis Slipper.  - she has numbness and tingling in her feet. Last foot exam: 05/2021.  She also has HTN.  2 of her sons died in the 2 weeks in 23-Mar-2019: one of Covid, one unexpectedly!   ROS: + see HPI  I reviewed pt's medications, allergies, PMH, social hx, family hx, and changes were documented in the history of present illness. Otherwise, unchanged from my initial visit note.  Past Medical History:  Diagnosis Date   CAD (coronary artery disease)    History of nuclear stress test 11/22/2011   bruce myoview; normal pattern of perfusio; post-stress EF 76%; low risk scan   Hyperlipidemia    Hypertension    S/P CABG x 6 04/2007   LIMA to LAD, SVG to ramus intermedius, SVG to OM1 & OM2, SVG to acute marginal, SVG to distal RCA   Type 2 diabetes mellitus ([place])    Past Surgical History:  Procedure Laterality Date   CARDIAC CATHETERIZATION  05/07/2007   EF 55%, focal mild hypocontractility in mid-distal inferior wall & mid posterolateral wall; severe multivessel CAD - susequent CABGx6 (Dr. Corky Downs)   CORONARY ARTERY BYPASS GRAFT  05/09/2007   LIMA to LAD, veing to intermediate; SVG to OM1 & OM2; SVG to acute marginal & distal RCA (Dr. Roxan Hockey)   TRANSTHORACIC ECHOCARDIOGRAM  02/13/2010   EF =>55%, vigorous contraction EF 65%; LA mild-mod dilated; IV normal diameter - normal CVP; trace MR; mild TR; trace AV regurg   Social History   Social History   Marital status: Widowed    Spouse name: N/A   Number of children: 8   Occupational History   homemaker   Social History Main Topics   Smoking status: Never Smoker   Smokeless tobacco: Never Used     Comment: quit 1960's  smoked very lightly.   Alcohol use No   Drug use: No     Social History Narrative   Married.   Retired.   Works as a Building control surveyor.    Enjoys helping other.    Current Outpatient Medications on File Prior to Visit  Medication Sig Dispense Refill   Acetaminophen (TYLENOL  8 HOUR PO) Take 1 tablet by mouth every 8 (eight) hours as needed.      aspirin 81 MG tablet Take 81 mg by mouth daily.     Azelastine HCl 137 MCG/SPRAY SOLN As needed     Blood Glucose Monitoring Suppl (ACCU-CHEK GUIDE) w/Device KIT Use as instructed to check blood sugar. 1 kit 0   Cholecalciferol (  VITAMIN D) 50 MCG (2000 UT) CAPS Take 1 capsule by mouth daily.     Continuous Blood Gluc Sensor (FREESTYLE LIBRE 3 SENSOR) MISC by Does not apply route.     ezetimibe (ZETIA) 10 MG tablet Take 1 tablet (10 mg total) by mouth daily. 90 tablet 3   fexofenadine (ALLEGRA) 180 MG tablet Take 180 mg by mouth daily.     glucose blood (ACCU-CHEK GUIDE) test strip USE TO CHECK SUGAR THREE TIMES DAILY AS DIRECTED 250 each 3   Insulin Glargine-Lixisenatide (SOLIQUA) 100-33 UNT-MCG/ML SOPN Inject 22 Units into the skin daily. For diabetes. 15 mL 0   Insulin Pen Needle (BD PEN NEEDLE NANO 2ND GEN) 32G X 4 MM MISC USE WITH INSULIN PEN 4 TIMES DAILY. 300 each 3   losartan (COZAAR) 100 MG tablet Take 1 tablet by mouth once daily 90 tablet 3   metoprolol succinate (TOPROL-XL) 50 MG 24 hr tablet TAKE 1 TABLET BY MOUTH IN THE MORNING AND 1/2 (ONE-HALF)  TAB IN THE EVENING 135 tablet 3   Multiple Vitamin (MULTIVITAMIN) capsule Take 1 capsule by mouth daily.     pantoprazole (PROTONIX) 40 MG tablet Take 1 tablet by mouth once daily 90 tablet 0   Polyethyl Glycol-Propyl Glycol (SYSTANE OP) Apply 2 drops to eye 2 (two) times daily.      rosuvastatin (CRESTOR) 10 MG tablet Take 1 tablet (10 mg total) by mouth daily. for cholesterol. 90 tablet 3   TRAVATAN Z 0.004 % SOLN ophthalmic solution Place 1 drop into both eyes at bedtime.     No current facility-administered medications on file prior to visit.   No Known Allergies   Family History  Problem Relation Age of Onset   Cancer Mother    Diabetes Sister    Cancer Child    Pt has FH of DM in [place], sister - died from DM complications (refused to start HD), sons - 1 son  died from DM complications.  Her youngest son died 2017/02/04 (cancer). He was 53.  PE: BP 120/74 (BP Location: Left Arm, Patient Position: Sitting, Cuff Size: Normal)   Pulse 69   Ht _0  (1.6 m)   Wt 125 lb 6.4 oz (56.9 kg)   SpO2 99%   BMI 22.21 kg/m  Wt Readings from Last 3 Encounters:  12/14/21 125 lb 6.4 oz (56.9 kg)  10/12/21 128 lb (58.1 kg)  09/28/21 127 lb 6 oz (57.8 kg)   Constitutional: normal weight, in NAD Eyes: EOMI, no exophthalmos ENT: no thyromegaly, no cervical lymphadenopathy Cardiovascular: RRR, No MRG Respiratory: CTA B Musculoskeletal: no deformities Skin: moist, warm, no rashes Neurological: no tremor with outstretched hands  ASSESSMENT: 1. DM2, insulin-dependent, uncontrolled, with complications - CAD - s/p CABG x6 - CKD - DR - PN  2. HL  3. PN  PLAN:  1. Patient with longstanding, uncontrolled, type 2 diabetes, previously on a basal-bolus insulin regimen with deterioration of her diabetes control after her sons' deaths.  The major hurdle in her diabetes control was the fact that she was not following the recommended regimen, missing doses of insulin, skipping NovoLog and taking different doses of insulin than recommended.  Blood sugars were quite fluctuating.  Since last visit, however, PCP her Levemir and NovoLog and started [place] to simplify her regimen.  Her HbA1c improved to 7.9%. CGM interpretation: -At today's visit, we reviewed her CGM downloads: It appears that 36% of values are in target range (goal >70%), while 64% are higher than  180 (goal <25%), and 0% are lower than 70 (goal <4%).  The calculated average blood sugar is 213.  The projected HbA1c for the next 3 months (GMI) is 8.4%. -Reviewing the CGM trends, sugars are dropping overnight to the upper normal range and then increasing abruptly after breakfast and again after dinner.  She tells me that she is taking [place] "very seldom" as it is mostly not necessary, since the sugars are  low in the morning.  Reviewing individual CGM traces in the last 2 weeks, she has not had low blood sugars at anytime of the day, including in the morning.  We discussed about the absolute importance of taking the medication every day but also move it before breakfast, since now she is taking it after she eats. -At this point, due to the fact that she continues to take the diabetic regimen on a prn basis despite repeated advice, at every visit, to take it daily, I think further endocrinology appointments are futile.  She will continue to follow-up with PCP. - I suggested to:  Patient Instructions  Please move: - Soliqua 20-22 units before breakfast, every day  Please continue to follow-up with PCP for your diabetes.  - we checked her HbA1c: 8.4% (higher) - advised to check sugars at different times of the day - 4x a day, rotating check times - advised for yearly eye exams >> she is UTD -Continue to see PCP for this problem.  2. HL -Reviewed latest lipid panel from 03/2021: LDL above goal history of cardiovascular disease, the rest the fractions at goal: Lab Results  Component Value Date   CHOL 194 04/06/2021   HDL 65.80 04/06/2021   LDLCALC 104 (H) 04/06/2021   TRIG 121.0 04/06/2021   CHOLHDL 3 04/06/2021  -She continues on Crestor 10 mg daily, without side effects  Philemon Kingdom, MD PhD [facility] Endocrinology

## 2021-12-18 ENCOUNTER — Telehealth: Payer: Self-pay | Admitting: Primary Care

## 2021-12-18 DIAGNOSIS — R42 Dizziness and giddiness: Secondary | ICD-10-CM | POA: Diagnosis not present

## 2021-12-18 DIAGNOSIS — R040 Epistaxis: Secondary | ICD-10-CM | POA: Diagnosis not present

## 2021-12-18 DIAGNOSIS — H8112 Benign paroxysmal vertigo, left ear: Secondary | ICD-10-CM | POA: Diagnosis not present

## 2021-12-18 NOTE — Telephone Encounter (Signed)
Spoke with patient, she is concerned because the Endocrinologist said she didn't need to see her anymore that she needed to follow up with her PCP. Reviewed endocrinology note from most recent visit and relayed to patient that she was improving on the regimen that Jae Dire prescribed so the endocrinologist recommended to just follow up with her PCP in the future. She has appointment 02/09/22 with Jae Dire and will discuss further with her then.

## 2021-12-18 NOTE — Telephone Encounter (Signed)
Patient called in and would like for someone to give her a call. She wouldn't state what it was in regards to. She can be reached at [phone]. Thank you!

## 2021-12-21 DIAGNOSIS — E1165 Type 2 diabetes mellitus with hyperglycemia: Secondary | ICD-10-CM | POA: Diagnosis not present

## 2021-12-21 DIAGNOSIS — Z794 Long term (current) use of insulin: Secondary | ICD-10-CM | POA: Diagnosis not present

## 2022-01-02 ENCOUNTER — Telehealth: Payer: Self-pay | Admitting: Cardiovascular Disease

## 2022-01-02 ENCOUNTER — Other Ambulatory Visit: Payer: Self-pay | Admitting: Cardiovascular Disease

## 2022-01-02 DIAGNOSIS — I10 Essential (primary) hypertension: Secondary | ICD-10-CM

## 2022-01-02 NOTE — Telephone Encounter (Signed)
*  STAT* If patient is at the pharmacy, call can be transferred to refill team.   1. Which medications need to be refilled? (please list name of each medication and dose if known)   losartan (COZAAR) 100 MG tablet    2. Which pharmacy/location (including street and city if local pharmacy) is medication to be sent to? [facility] [address] [place] - [address]   3. Do they need a 30 day or 90 day supply? 90

## 2022-01-03 MED ORDER — LOSARTAN POTASSIUM 100 MG PO TABS: 100.0000 mg | ORAL_TABLET | Freq: Every day | ORAL | 2 refills | Status: DC

## 2022-01-21 ENCOUNTER — Other Ambulatory Visit: Payer: Self-pay | Admitting: Internal Medicine

## 2022-01-21 ENCOUNTER — Other Ambulatory Visit: Payer: Self-pay | Admitting: Cardiovascular Disease

## 2022-01-21 ENCOUNTER — Other Ambulatory Visit: Payer: Self-pay | Admitting: Primary Care

## 2022-01-21 DIAGNOSIS — E1165 Type 2 diabetes mellitus with hyperglycemia: Secondary | ICD-10-CM | POA: Diagnosis not present

## 2022-01-21 DIAGNOSIS — E11319 Type 2 diabetes mellitus with unspecified diabetic retinopathy without macular edema: Secondary | ICD-10-CM

## 2022-01-21 DIAGNOSIS — Z794 Long term (current) use of insulin: Secondary | ICD-10-CM | POA: Diagnosis not present

## 2022-01-23 DIAGNOSIS — H35373 Puckering of macula, bilateral: Secondary | ICD-10-CM | POA: Diagnosis not present

## 2022-01-23 DIAGNOSIS — H43813 Vitreous degeneration, bilateral: Secondary | ICD-10-CM | POA: Diagnosis not present

## 2022-01-23 DIAGNOSIS — H35033 Hypertensive retinopathy, bilateral: Secondary | ICD-10-CM | POA: Diagnosis not present

## 2022-01-23 DIAGNOSIS — E113313 Type 2 diabetes mellitus with moderate nonproliferative diabetic retinopathy with macular edema, bilateral: Secondary | ICD-10-CM | POA: Diagnosis not present

## 2022-02-02 DIAGNOSIS — E785 Hyperlipidemia, unspecified: Secondary | ICD-10-CM | POA: Diagnosis not present

## 2022-02-02 DIAGNOSIS — Z79899 Other long term (current) drug therapy: Secondary | ICD-10-CM | POA: Diagnosis not present

## 2022-02-02 LAB — CBC WITH DIFFERENTIAL/PLATELET

## 2022-02-03 LAB — CBC WITH DIFFERENTIAL/PLATELET
Basophils Absolute: 0 10*3/uL (ref 0.0–0.2)
Basos: 1 %
EOS (ABSOLUTE): 0.1 10*3/uL (ref 0.0–0.4)
Eos: 2 %
Hematocrit: 36.5 % (ref 34.0–46.6)
Hemoglobin: 12.4 g/dL (ref 11.1–15.9)
Immature Grans (Abs): 0 10*3/uL (ref 0.0–0.1)
Immature Granulocytes: 0 %
Lymphocytes Absolute: 2.7 10*3/uL (ref 0.7–3.1)
Lymphs: 46 %
MCH: 31 pg (ref 26.6–33.0)
MCHC: 34 g/dL (ref 31.5–35.7)
MCV: 91 fL (ref 79–97)
Monocytes Absolute: 0.5 10*3/uL (ref 0.1–0.9)
Monocytes: 9 %
Neutrophils Absolute: 2.5 10*3/uL (ref 1.4–7.0)
Neutrophils: 42 %
Platelets: 172 10*3/uL (ref 150–450)
RBC: 4 x10E6/uL (ref 3.77–5.28)
RDW: 13.3 % (ref 11.7–15.4)
WBC: 5.8 10*3/uL (ref 3.4–10.8)

## 2022-02-03 LAB — TSH+T4F+T3FREE
Free T4: 1.54 ng/dL (ref 0.82–1.77)
T3, Free: 3 pg/mL (ref 2.0–4.4)
TSH: 0.935 u[IU]/mL (ref 0.450–4.500)

## 2022-02-03 LAB — LIPID PANEL
Chol/HDL Ratio: 1.6 ratio (ref 0.0–4.4)
Cholesterol, Total: 133 mg/dL (ref 100–199)
HDL: 81 mg/dL (ref >39)
LDL Chol Calc (NIH): 39 mg/dL (ref 0–99)
Triglycerides: 63 mg/dL (ref 0–149)
VLDL Cholesterol Cal: 13 mg/dL (ref 5–40)

## 2022-02-06 ENCOUNTER — Encounter: Payer: Self-pay | Admitting: Cardiovascular Disease

## 2022-02-06 ENCOUNTER — Ambulatory Visit: Payer: Medicare HMO | Attending: Cardiovascular Disease | Admitting: Cardiovascular Disease

## 2022-02-06 VITALS — BP 134/72 | HR 68 | Ht 63.0 in | Wt 125.2 lb

## 2022-02-06 DIAGNOSIS — E1159 Type 2 diabetes mellitus with other circulatory complications: Secondary | ICD-10-CM

## 2022-02-06 DIAGNOSIS — K219 Gastro-esophageal reflux disease without esophagitis: Secondary | ICD-10-CM | POA: Diagnosis not present

## 2022-02-06 DIAGNOSIS — I2510 Atherosclerotic heart disease of native coronary artery without angina pectoris: Secondary | ICD-10-CM | POA: Diagnosis not present

## 2022-02-06 DIAGNOSIS — E785 Hyperlipidemia, unspecified: Secondary | ICD-10-CM

## 2022-02-06 DIAGNOSIS — Z09 Encounter for follow-up examination after completed treatment for conditions other than malignant neoplasm: Secondary | ICD-10-CM

## 2022-02-06 DIAGNOSIS — Z951 Presence of aortocoronary bypass graft: Secondary | ICD-10-CM | POA: Diagnosis not present

## 2022-02-06 DIAGNOSIS — Z794 Long term (current) use of insulin: Secondary | ICD-10-CM

## 2022-02-06 NOTE — Patient Instructions (Signed)
   Follow-Up: At [facility], you and your health needs are our priority.  As part of our continuing mission to provide you with exceptional heart care, we have created designated Provider Care Teams.  These Care Teams include your primary Cardiologist (physician) and Advanced Practice Providers (APPs -  Physician Assistants and Nurse Practitioners) who all work together to provide you with the care you need, when you need it.  We recommend signing up for the patient portal called "MyChart".  Sign up information is provided on this After Visit Summary.  MyChart is used to connect with patients for Virtual Visits (Telemedicine).  Patients are able to view lab/test results, encounter notes, upcoming appointments, etc.  Non-urgent messages can be sent to your provider as well.   To learn more about what you can do with MyChart, go to [email or web address].    Your next appointment:   9 month(s)  Provider:   Shelva Majestic, MD
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
## 2022-02-09 ENCOUNTER — Ambulatory Visit (INDEPENDENT_AMBULATORY_CARE_PROVIDER_SITE_OTHER): Payer: Medicare HMO

## 2022-02-09 VITALS — Ht 63.0 in | Wt 125.0 lb

## 2022-02-09 DIAGNOSIS — Z0000 Encounter for general adult medical examination without abnormal findings: Secondary | ICD-10-CM

## 2022-02-09 NOTE — Patient Instructions (Addendum)
Laura Ferguson , Thank you for taking time to come for your Medicare Wellness Visit. I appreciate your ongoing commitment to your health goals. Please review the following plan we discussed and let me know if I can assist you in the future.   These are the goals we discussed:     Goals Addressed             This Visit's Progress    Patient Stated       Would like to maintain current routine       This is a list of the screening recommended for you and due dates:  Health Maintenance  Topic Date Due   DTaP/Tdap/Td vaccine (1 - Tdap) Never done   COVID-19 Vaccine (6 - 2023-24 season) 02/25/2022*   Zoster (Shingles) Vaccine (1 of 2) 05/11/2022*   DEXA scan (bone density measurement)  10/13/2022*   Complete foot exam   06/02/2022   Hemoglobin A1C  06/14/2022   Eye exam for diabetics  01/17/2023   Medicare Annual Wellness Visit  02/10/2023   Pneumonia Vaccine  Completed   Flu Shot  Completed   HPV Vaccine  Aged Out  *Topic was postponed. The date shown is not the original due date.   Conditions/risks identified: none new  Next appointment: Follow up in one year for your annual wellness visit    Preventive Care 65 Years and Older, Female Preventive care refers to lifestyle choices and visits with your health care provider that can promote health and wellness. What does preventive care include? A yearly physical exam. This is also called an annual well check. Dental exams once or twice a year. Routine eye exams. Ask your health care provider how often you should have your eyes checked. Personal lifestyle choices, including: Daily care of your teeth and gums. Regular physical activity. Eating a healthy diet. Avoiding tobacco and drug use. Limiting alcohol use. Practicing safe sex. Taking low-dose aspirin every day. Taking vitamin and mineral supplements as recommended by your health care provider. What happens during an annual well check? The services and screenings done by  your health care provider during your annual well check will depend on your age, overall health, lifestyle risk factors, and family history of disease. Counseling  Your health care provider may ask you questions about your: Alcohol use. Tobacco use. Drug use. Emotional well-being. Home and relationship well-being. Sexual activity. Eating habits. History of falls. Memory and ability to understand (cognition). Work and work Statistician. Reproductive health. Screening  You may have the following tests or measurements: Height, weight, and BMI. Blood pressure. Lipid and cholesterol levels. These may be checked every 5 years, or more frequently if you are over 88 years old. Skin check. Lung cancer screening. You may have this screening every year starting at age 18 if you have a 30-pack-year history of smoking and currently smoke or have quit within the past 15 years. Fecal occult blood test (FOBT) of the stool. You may have this test every year starting at age 66. Flexible sigmoidoscopy or colonoscopy. You may have a sigmoidoscopy every 5 years or a colonoscopy every 10 years starting at age 9. Hepatitis C blood test. Hepatitis B blood test. Sexually transmitted disease (STD) testing. Diabetes screening. This is done by checking your blood sugar (glucose) after you have not eaten for a while (fasting). You may have this done every 1-3 years. Bone density scan. This is done to screen for osteoporosis. You may have this done starting at age  65. Mammogram. This may be done every 1-2 years. Talk to your health care provider about how often you should have regular mammograms. Talk with your health care provider about your test results, treatment options, and if necessary, the need for more tests. Vaccines  Your health care provider may recommend certain vaccines, such as: Influenza vaccine. This is recommended every year. Tetanus, diphtheria, and acellular pertussis (Tdap, Td) vaccine. You  may need a Td booster every 10 years. Zoster vaccine. You may need this after age 43. Pneumococcal 13-valent conjugate (PCV13) vaccine. One dose is recommended after age 41. Pneumococcal polysaccharide (PPSV23) vaccine. One dose is recommended after age 39. Talk to your health care provider about which screenings and vaccines you need and how often you need them. This information is not intended to replace advice given to you by your health care provider. Make sure you discuss any questions you have with your health care provider. Document Released: 01/28/2015 Document Revised: 09/21/2015 Document Reviewed: 11/02/2014 Elsevier Interactive Patient Education  2017 [employer] Prevention in the Home Falls can cause injuries. They can happen to people of all ages. There are many things you can do to make your home safe and to help prevent falls. What can I do on the outside of my home? Regularly fix the edges of walkways and driveways and fix any cracks. Remove anything that might make you trip as you walk through a door, such as a raised step or threshold. Trim any bushes or trees on the path to your home. Use bright outdoor lighting. Clear any walking paths of anything that might make someone trip, such as rocks or tools. Regularly check to see if handrails are loose or broken. Make sure that both sides of any steps have handrails. Any raised decks and porches should have guardrails on the edges. Have any leaves, snow, or ice cleared regularly. Use sand or salt on walking paths during winter. Clean up any spills in your garage right away. This includes oil or grease spills. What can I do in the bathroom? Use night lights. Install grab bars by the toilet and in the tub and shower. Do not use towel bars as grab bars. Use non-skid mats or decals in the tub or shower. If you need to sit down in the shower, use a plastic, non-slip stool. Keep the floor dry. Clean up any water that spills  on the floor as soon as it happens. Remove soap buildup in the tub or shower regularly. Attach bath mats securely with double-sided non-slip rug tape. Do not have throw rugs and other things on the floor that can make you trip. What can I do in the bedroom? Use night lights. Make sure that you have a light by your bed that is easy to reach. Do not use any sheets or blankets that are too big for your bed. They should not hang down onto the floor. Have a firm chair that has side arms. You can use this for support while you get dressed. Do not have throw rugs and other things on the floor that can make you trip. What can I do in the kitchen? Clean up any spills right away. Avoid walking on wet floors. Keep items that you use a lot in easy-to-reach places. If you need to reach something above you, use a strong step stool that has a grab bar. Keep electrical cords out of the way. Do not use floor polish or wax that makes floors slippery.  If you must use wax, use non-skid floor wax. Do not have throw rugs and other things on the floor that can make you trip. What can I do with my stairs? Do not leave any items on the stairs. Make sure that there are handrails on both sides of the stairs and use them. Fix handrails that are broken or loose. Make sure that handrails are as long as the stairways. Check any carpeting to make sure that it is firmly attached to the stairs. Fix any carpet that is loose or worn. Avoid having throw rugs at the top or bottom of the stairs. If you do have throw rugs, attach them to the floor with carpet tape. Make sure that you have a light switch at the top of the stairs and the bottom of the stairs. If you do not have them, ask someone to add them for you. What else can I do to help prevent falls? Wear shoes that: Do not have high heels. Have rubber bottoms. Are comfortable and fit you well. Are closed at the toe. Do not wear sandals. If you use a stepladder: Make  sure that it is fully opened. Do not climb a closed stepladder. Make sure that both sides of the stepladder are locked into place. Ask someone to hold it for you, if possible. Clearly mark and make sure that you can see: Any grab bars or handrails. First and last steps. Where the edge of each step is. Use tools that help you move around (mobility aids) if they are needed. These include: Canes. Walkers. Scooters. Crutches. Turn on the lights when you go into a dark area. Replace any light bulbs as soon as they burn out. Set up your furniture so you have a clear path. Avoid moving your furniture around. If any of your floors are uneven, fix them. If there are any pets around you, be aware of where they are. Review your medicines with your doctor. Some medicines can make you feel dizzy. This can increase your chance of falling. Ask your doctor what other things that you can do to help prevent falls. This information is not intended to replace advice given to you by your health care provider. Make sure you discuss any questions you have with your health care provider. Document Released: 10/28/2008 Document Revised: 06/09/2015 Document Reviewed: 02/05/2014 Elsevier Interactive Patient Education  2017 [employer].

## 2022-02-09 NOTE — Progress Notes (Signed)
Subjective:   Laura Ferguson is a 89 y.o. female who presents for Medicare Annual (Subsequent) preventive examination.  Review of Systems    No ROS.  Medicare Wellness Virtual Visit.  Visual/audio telehealth visit, UTA vital signs.   See social history for additional risk factors.   Cardiac Risk Factors include: advanced age (>59men, >10 women);diabetes mellitus;hypertension     Objective:    Today's Vitals   02/09/22 0834  Weight: 125 lb (56.7 kg)  Height: 5\' 3"  (1.6 m)   Body mass index is 22.14 kg/m.     02/09/2022    8:46 AM 02/08/2021    8:17 AM 10/13/2019   11:18 AM 05/19/2019    9:50 AM 11/05/2018    6:26 PM 08/09/2016   10:53 AM 06/08/2016    3:17 PM  Advanced Directives  Does Patient Have a Medical Advance Directive? No No No No No No No  Would patient like information on creating a medical advance directive? No - Patient declined Yes (MAU/Ambulatory/Procedural Areas - Information given) Yes (MAU/Ambulatory/Procedural Areas - Information given) No - Patient declined No - Patient declined  No - Patient declined    Current Medications (verified) Outpatient Encounter Medications as of 02/09/2022  Medication Sig   Acetaminophen (TYLENOL 8 HOUR PO) Take 1 tablet by mouth every 8 (eight) hours as needed.    aspirin 81 MG tablet Take 81 mg by mouth daily.   Azelastine HCl 137 MCG/SPRAY SOLN As needed (Patient not taking: Reported on 02/06/2022)   Blood Glucose Monitoring Suppl (ACCU-CHEK GUIDE) w/Device KIT Use as instructed to check blood sugar.   Cholecalciferol (VITAMIN D) 50 MCG (2000 UT) CAPS Take 1 capsule by mouth daily.   Continuous Blood Gluc Sensor (FREESTYLE LIBRE 3 SENSOR) MISC by Does not apply route.   ezetimibe (ZETIA) 10 MG tablet Take 1 tablet (10 mg total) by mouth daily.   fexofenadine (ALLEGRA) 180 MG tablet Take 180 mg by mouth daily.   glucose blood (ACCU-CHEK GUIDE) test strip USE TO CHECK SUGAR THREE TIMES DAILY AS DIRECTED   Insulin  Glargine-Lixisenatide (SOLIQUA) 100-33 UNT-MCG/ML SOPN INJECT 22 UNITS SUBCUTANEOUSLY ONCE DAILY FOR DIABETES   Insulin Pen Needle (BD PEN NEEDLE NANO 2ND GEN) 32G X 4 MM MISC USE 1 PEN NEEDLE WITH INSULIN PEN  4 TIMES DAILY   losartan (COZAAR) 100 MG tablet Take 1 tablet (100 mg total) by mouth daily.   metoprolol succinate (TOPROL-XL) 50 MG 24 hr tablet TAKE 1 TABLET BY MOUTH IN THE MORNING AND 1/2 (ONE-HALF)  TAB IN THE EVENING   Multiple Vitamin (MULTIVITAMIN) capsule Take 1 capsule by mouth daily.   pantoprazole (PROTONIX) 40 MG tablet Take 1 tablet by mouth once daily   Polyethyl Glycol-Propyl Glycol (SYSTANE OP) Apply 2 drops to eye 2 (two) times daily.    rosuvastatin (CRESTOR) 10 MG tablet Take 1 tablet (10 mg total) by mouth daily. for cholesterol.   TRAVATAN Z 0.004 % SOLN ophthalmic solution Place 1 drop into both eyes at bedtime.   No facility-administered encounter medications on file as of 02/09/2022.    Allergies (verified) Patient has no known allergies.   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 Corky Downs)   CORONARY ARTERY BYPASS GRAFT  05/09/2007   LIMA to LAD, veing to intermediate; SVG to OM1 & OM2; SVG to acute marginal & distal RCA (Dr. Roxan Hockey)   TRANSTHORACIC ECHOCARDIOGRAM  02/13/2010   EF =>55%, vigorous contraction EF 65%; LA mild-mod dilated; IV normal diameter - normal CVP; trace  MR; mild TR; trace AV regurg   Family History  Problem Relation Age of Onset   Cancer Mother    Diabetes Sister    Cancer Child    Social History   Socioeconomic History   Marital status: Widowed    Spouse name: Not on file   Number of children: 7   Years of education: Not on file   Highest education level: Not on file  Occupational History   Not on file  Tobacco Use   Smoking status: Never   Smokeless tobacco: Never   Tobacco comments:    quit 1960's  smoked very lightly.  Substance and Sexual Activity   Alcohol use: No    Alcohol/week: 0.0 standard drinks of alcohol   Drug use: No   Sexual activity: Not on file  Other Topics Concern   Not on file  Social History Narrative   Married.   Retired.   Works as a Building control surveyor.    Enjoys helping other.    Social Determinants of Health   Financial Resource Strain: Low Risk  (02/09/2022)   Overall Financial Resource Strain (CARDIA)    Difficulty of Paying Living Expenses: Not hard at all  Food Insecurity: No Food Insecurity (02/09/2022)   Hunger Vital Sign    Worried About Running Out of Food in the Last Year: Never true    Ran Out of Food in the Last Year: Never true  Transportation Needs: No Transportation Needs (02/09/2022)   PRAPARE - Hydrologist (Medical): No    Lack of Transportation (Non-Medical): No  Physical Activity: Inactive (02/08/2021)   Exercise Vital Sign    Days of Exercise per Week: 0 days    Minutes of Exercise per Session: 0 min  Stress: No Stress Concern Present (02/09/2022)   [place]    Feeling of Stress : Only a little  Social Connections: Moderately Isolated (02/09/2022)   Social Connection and Isolation Panel [NHANES]    Frequency of Communication with Friends and Family: More than three times a week    Frequency of Social Gatherings with Friends and Family: More than three times a week    Attends  Religious Services: More than 4 times per year    Active Member of [employer] or Organizations: No    Attends Archivist Meetings: Never    Marital Status: Widowed    Tobacco Counseling Counseling given: Not Answered Tobacco comments: quit 1960's  smoked very lightly.   Clinical Intake:  Pre-visit preparation completed: Yes        Diabetes: Yes  How often do you need to have someone help you when you read instructions, pamphlets, or other written materials from your doctor or pharmacy?: 1 - Never  Nutrition Risk Assessment: Does  the patient have any non-healing wounds?  No  Has the patient had any unintentional weight loss or weight gain?  No   Diabetes: Is the patient diabetic?  Yes  If diabetic, was a CBG obtained today?  No  Did the patient bring in their glucometer from home?  No  How often do you monitor your CBG's? daily.   Financial Strains and Diabetes Management: Are you having any financial strains with the device, your supplies or your medication? No .  Does the patient want to be seen by Chronic Care Management for management of their diabetes?  No  Would the patient like to be referred to a Nutritionist or for Diabetic Management?  No    Interpreter Needed?: No      Activities of Daily Living    02/09/2022    8:39 AM 10/12/2021    9:04 AM  In your present state of health, do you have any difficulty performing the following activities:  Hearing? 0 1  Vision? 0 1  Difficulty concentrating or making decisions? 0 0  Walking or climbing stairs? 0 0  Dressing or bathing? 0 0  Doing errands, shopping? 0 0  Preparing Food and eating ? N   Using the Toilet? N   In the past six months, have you accidently leaked urine? N   Do you have problems with loss of bowel control? N   Managing your Medications? N   Managing your Finances? N   Housekeeping or managing your Housekeeping? N     Patient Care Team: Doreene Nest, NP as PCP - General  (Internal Medicine) Lennette Bihari, MD as PCP - Cardiology (Cardiology) Kathyrn Sheriff, [facility] as Pharmacist (Pharmacist)  Indicate any recent Medical Services you may have received from other than [facility] providers in the past year (date may be approximate).     Assessment:   This is a routine wellness examination for [place].  I connected with  [name] on 02/09/22 by a audio enabled telemedicine application and verified that I am speaking with the correct person using two identifiers.  Patient Location: Home  Provider Location: Office/Clinic  I discussed the limitations of evaluation and management by telemedicine. The patient expressed understanding and agreed to proceed.   Hearing/Vision screen Hearing Screening - Comments:: Followed by [facility], Dr.  Andee Poles Has hearing aids but does not wear them often.   Vision Screening - Comments:: Last exam 01/2021, [facility], Dr. Brooke Dare  Dietary issues and exercise activities discussed: Current Exercise Habits: Home exercise routine, Type of exercise: strength training/weights;stretching, Intensity: Mild Monitors diet Diabetic Good water intake   Goals Addressed             This Visit's Progress    Patient Stated       Would like to maintain current routine       Depression Screen    02/09/2022    8:37 AM 10/12/2021    9:00 AM 02/08/2021    8:20 AM 10/13/2019    2:05 PM 09/22/2019   12:15 PM 05/19/2019    9:52 AM 10/16/2017    9:59 AM  PHQ 2/9 Scores  PHQ - 2 Score 1 6 1 5 6 2 1   PHQ- 9 Score  8  8 17 2      Fall Risk    02/09/2022    8:39 AM 10/12/2021    9:04 AM 02/08/2021    8:19 AM 06/08/2020    9:02 AM 05/19/2019  9:50 AM  [place] in the past year? 0 0 0 0 1  Comment     hanging curtains  Number falls in past yr: 0 0 0 0 0  Injury with Fall? 0 0 0 0 0  Risk for fall due to :     Medication side effect  Follow up Falls evaluation completed;Falls prevention discussed  Falls prevention  discussed Falls evaluation completed Falls evaluation completed;Falls prevention discussed    FALL RISK PREVENTION PERTAINING TO THE HOME: Home free of loose throw rugs in walkways, pet beds, electrical cords, etc? Yes  Adequate lighting in your home to reduce risk of falls? Yes   ASSISTIVE DEVICES UTILIZED TO PREVENT FALLS: Life alert? No  Use of a cane, walker or w/c? No   TIMED UP AND GO: Was the test performed? No .   Cognitive Function:    05/19/2019    9:54 AM  MMSE - Mini Mental State Exam  Orientation to time 5  Orientation to Place 5  Registration 3  Attention/ Calculation 5  Recall 3  Language- repeat 1        02/09/2022    8:45 AM  6CIT Screen  What Year? 0 points  What month? 0 points  What time? 0 points  Count back from 20 0 points  Months in reverse 0 points  Repeat phrase 0 points  Total Score 0 points    Immunizations Immunization History  Administered Date(s) Administered   Fluad Quad(high Dose 65+) 10/16/2018, 10/13/2019, 09/28/2021   Influenza,inj,Quad PF,6+ Mos 09/25/2017   PFIZER Comirnaty(Gray Top)Covid-19 Tri-Sucrose Vaccine 07/05/2020   PFIZER(Purple Top)SARS-COV-2 Vaccination 03/15/2019, 04/04/2019, 09/15/2019   Pfizer Covid-19 Vaccine Bivalent Booster 35yrs & up 01/02/2021   Pneumococcal Conjugate-13 10/13/2019   Pneumococcal Polysaccharide-23 10/12/2021    TDAP status: Due, Education has been provided regarding the importance of this vaccine. Advised may receive this vaccine at local pharmacy or Health Dept. Aware to provide a copy of the vaccination record if obtained from local pharmacy or Health Dept. Verbalized acceptance and understanding.  Covid-19 vaccine status: Completed vaccines   Shingrix Completed?: No.    Education has been provided regarding the importance of this vaccine. Patient has been advised to call insurance company to determine out of pocket expense if they have not yet received this vaccine. Advised may also  receive vaccine at local pharmacy or Health Dept. Verbalized acceptance and understanding.  Screening Tests Health Maintenance  Topic Date Due   DTaP/Tdap/Td (1 - Tdap) Never done   COVID-19 Vaccine (6 - 2023-24 season) 02/25/2022 (Originally 09/15/2021)   Zoster Vaccines- Shingrix (1 of 2) 05/11/2022 (Originally 06/13/1951)   DEXA SCAN  10/13/2022 (Originally 06/14/1999)   FOOT EXAM  06/02/2022   HEMOGLOBIN A1C  06/14/2022   OPHTHALMOLOGY EXAM  01/17/2023   Medicare Annual Wellness (AWV)  02/10/2023   Pneumonia Vaccine 41+ Years old  Completed   INFLUENZA VACCINE  Completed   HPV VACCINES  Aged Out    Health Maintenance Health Maintenance Due  Topic Date Due   DTaP/Tdap/Td (1 - Tdap) Never done   Lung Cancer Screening: (Low Dose CT Chest recommended if Age 56-80 years, 30 pack-year currently smoking OR have quit w/in 15years.) does not qualify.   Hepatitis C Screening: does not qualify.  Vision Screening: Recommended annual ophthalmology exams for early detection of glaucoma and other disorders of the eye.  Dental Screening: Recommended annual dental exams for proper oral hygiene  Community Resource Referral /  Chronic Care Management: CRR required this visit?  No   CCM required this visit?  No      Plan:     I have personally reviewed and noted the following in the patient's chart:   Medical and social history Use of alcohol, tobacco or illicit drugs  Current medications and supplements including opioid prescriptions. Patient is not currently taking opioid prescriptions. Functional ability and status Nutritional status Physical activity Advanced directives List of other physicians Hospitalizations, surgeries, and ER visits in previous 12 months Vitals Screenings to include cognitive, depression, and falls Referrals and appointments  In addition, I have reviewed and discussed with patient certain preventive protocols, quality metrics, and best practice  recommendations. A written personalized care plan for preventive services as well as general preventive health recommendations were provided to patient.     Leta Jungling, LPN   [phone]

## 2022-02-21 DIAGNOSIS — Z794 Long term (current) use of insulin: Secondary | ICD-10-CM | POA: Diagnosis not present

## 2022-02-21 DIAGNOSIS — E1165 Type 2 diabetes mellitus with hyperglycemia: Secondary | ICD-10-CM | POA: Diagnosis not present

## 2022-02-26 DIAGNOSIS — H40003 Preglaucoma, unspecified, bilateral: Secondary | ICD-10-CM | POA: Diagnosis not present

## 2022-03-02 ENCOUNTER — Encounter: Payer: Self-pay | Admitting: Primary Care

## 2022-03-02 ENCOUNTER — Ambulatory Visit (INDEPENDENT_AMBULATORY_CARE_PROVIDER_SITE_OTHER): Payer: Medicare HMO | Admitting: Primary Care

## 2022-03-02 VITALS — BP 130/74 | HR 64 | Temp 98.1°F | Ht 63.0 in | Wt 127.0 lb

## 2022-03-02 DIAGNOSIS — Z794 Long term (current) use of insulin: Secondary | ICD-10-CM

## 2022-03-02 DIAGNOSIS — E11319 Type 2 diabetes mellitus with unspecified diabetic retinopathy without macular edema: Secondary | ICD-10-CM | POA: Diagnosis not present

## 2022-03-02 NOTE — Patient Instructions (Addendum)
Work on snacking. Try to make healthier choices.   Please schedule a physical to meet with me in 6 weeks .  It was a pleasure to see you today!

## 2022-03-02 NOTE — Progress Notes (Signed)
Subjective:    Patient ID: Laura Ferguson, female    DOB: May 25, 1932, 89 y.o.   MRN: [id number]  HPI  Laura Ferguson is a very pleasant 89 y.o. female with a history of type 2 diabetes, CAD, hypertension, hyperlipidemia, anxiety and depression who presents today to discuss glucose problem.   Following with endocrinology for diabetes, last office visit was in November 2023 with A1C of 8.4. Her Laura Ferguson was increased to 20-22 units before breakfast everyday.   She is mostly injecting 22 units in the morning. She is checking her blood sugars continuously which is running:  AM fasting: 120's Before lunch: 150's-low 200's Bedtime: 150's.  Over the last few weeks she's been running in the 300's during the day.   Her appetite has increased over the last few months. She is snacking more including crackers, potato chips, cookies.   She's woken a few times during the night with readings in the 60's-90's. Her last episode of hypoglycemia was "a while ago".   BP Readings from Last 3 Encounters:  03/02/22 130/74  02/06/22 134/72  12/14/21 120/74      Review of Systems  Respiratory:  Negative for shortness of breath.   Cardiovascular:  Negative for chest pain.  Neurological:  Negative for light-headedness and numbness.         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  Substance and Sexual Activity   Alcohol use: No    Alcohol/week: 0.0 standard drinks of alcohol   Drug use: No   Sexual activity: Not on file  Other Topics Concern   Not on file  Social History Narrative   Married.   Retired.   Works as a Building control surveyor.    Enjoys helping other.    Social Determinants of Health   Financial Resource Strain: Low Risk  (02/09/2022)   Overall Financial Resource Strain (CARDIA)    Difficulty of Paying Living Expenses: Not hard at all  Food Insecurity: No Food Insecurity (02/09/2022)   Hunger Vital Sign    Worried About Running Out of Food in the Last Year: Never true    Ran Out of Food in the Last Year: Never true  Transportation Needs: No Transportation Needs (02/09/2022)   PRAPARE - Hydrologist (Medical): No    Lack of Transportation (Non-Medical): No  Physical Activity: Inactive (02/08/2021)   Exercise Vital Sign    Days of Exercise per Week: 0 days    Minutes of Exercise per Session: 0 min  Stress: No Stress Concern Present (02/09/2022)   [place]  Stress Questionnaire    Feeling of Stress : Only a little  Social Connections: Moderately Isolated (02/09/2022)   Social Connection and Isolation Panel [NHANES]    Frequency of Communication with Friends and Family: More than three times a week    Frequency of Social Gatherings with Friends and Family: More than three times a week    Attends Religious Services: More than 4 times per year    Active Member of [employer] or Organizations: No    Attends Archivist Meetings: Never    Marital Status: Widowed  Intimate Partner Violence: Not At Risk (02/09/2022)   Humiliation, Afraid, Rape, and Kick questionnaire    Fear of Current or Ex-Partner: No    Emotionally Abused: No    Physically Abused: No    Sexually Abused: No    Past Surgical History:   Procedure Laterality Date   CARDIAC CATHETERIZATION  05/07/2007   EF 55%, focal mild hypocontractility in mid-distal inferior wall & mid posterolateral wall; severe multivessel CAD - susequent CABGx6 (Dr. Corky Downs)   CORONARY ARTERY BYPASS GRAFT  05/09/2007   LIMA to LAD, veing to intermediate; SVG to OM1 & OM2; SVG to acute marginal & distal RCA (Dr. Roxan Hockey)   TRANSTHORACIC ECHOCARDIOGRAM  02/13/2010   EF =>55%, vigorous contraction EF 65%; LA mild-mod dilated; IV normal diameter - normal CVP; trace MR; mild TR; trace AV regurg    Family History  Problem Relation Age of Onset   Cancer Mother    Diabetes Sister    Cancer Child     No Known Allergies  Current Outpatient Medications on File Prior to Visit  Medication Sig Dispense Refill   Acetaminophen (TYLENOL 8 HOUR PO) Take 1 tablet by mouth every 8 (eight) hours as needed.      aspirin 81 MG tablet Take 81 mg by mouth daily.     Blood Glucose Monitoring Suppl (ACCU-CHEK GUIDE) w/Device KIT Use as instructed to check blood sugar. 1 kit 0   Cholecalciferol (VITAMIN D) 50 MCG (2000 UT) CAPS Take 1 capsule by mouth daily.     Continuous Blood Gluc Sensor (FREESTYLE LIBRE 3 SENSOR) MISC by Does not apply route.     ezetimibe (ZETIA) 10 MG tablet Take 1 tablet (10 mg total) by mouth daily. 90 tablet 3   glucose blood (ACCU-CHEK GUIDE) test strip USE TO CHECK SUGAR THREE TIMES DAILY AS DIRECTED 250 each 3   Insulin Glargine-Lixisenatide (SOLIQUA) 100-33 UNT-MCG/ML SOPN INJECT 22 UNITS SUBCUTANEOUSLY ONCE DAILY FOR DIABETES 15 mL 1   Insulin Pen Needle (BD PEN NEEDLE NANO 2ND GEN) 32G X 4 MM MISC USE 1 PEN NEEDLE WITH INSULIN PEN  4 TIMES DAILY 100 each 0   losartan (COZAAR) 100 MG tablet Take 1 tablet (100 mg total) by mouth daily. 90 tablet 2   metoprolol succinate (TOPROL-XL) 50 MG 24 hr tablet TAKE 1 TABLET BY MOUTH IN THE MORNING AND 1/2 (ONE-HALF)  TAB IN THE EVENING 135 tablet 3   Multiple Vitamin (MULTIVITAMIN) capsule Take 1  capsule by mouth daily.     pantoprazole (PROTONIX) 40 MG tablet Take 1 tablet by mouth once daily 90 tablet 0   Polyethyl Glycol-Propyl Glycol (SYSTANE OP) Apply 2 drops to eye 2 (two) times daily.      rosuvastatin (CRESTOR) 10 MG tablet Take 1 tablet (10 mg total) by mouth daily. for cholesterol. 90 tablet 3   TRAVATAN Z 0.004 % SOLN ophthalmic solution Place 1 drop into both eyes  at bedtime.     Azelastine HCl 137 MCG/SPRAY SOLN As needed (Patient not taking: Reported on 02/06/2022)     fexofenadine (ALLEGRA) 180 MG tablet Take 180 mg by mouth daily. (Patient not taking: Reported on 03/02/2022)     No current facility-administered medications on file prior to visit.    BP 130/74   Pulse 64   Temp 98.1 F (36.7 C) (Temporal)   Ht 5' 3"$  (1.6 m)   Wt 127 lb (57.6 kg)   SpO2 98%   BMI 22.50 kg/m  Objective:   Physical Exam Cardiovascular:     Rate and Rhythm: Normal rate and regular rhythm.  Pulmonary:     Effort: Pulmonary effort is normal.     Breath sounds: Normal breath sounds.  Musculoskeletal:     Cervical back: Neck supple.  Skin:    General: Skin is warm and dry.           Assessment & Plan:  Type 2 diabetes mellitus with retinopathy, with long-term current use of insulin, macular edema presence unspecified, unspecified laterality, unspecified retinopathy severity ([place]) Assessment & Plan: Uncontrolled based on A1C result from November 2023. Too early to check A1C today. Glucose readings seem high in the afternoon, but she is a poor historian.   Continue Soliqua at 22 units daily. She is reticent to increase to 24 units which was my suggestion.  Continue to monitor glucose readings. Discussed to work on better snacking choices.   Follow up in 1-2 months.          Pleas Koch, NP

## 2022-03-02 NOTE — Assessment & Plan Note (Addendum)
Uncontrolled based on A1C result from November 2023. Too early to check A1C today. Glucose readings seem high in the afternoon, but she is a poor historian.   Continue Soliqua at 22 units daily. She is reticent to increase to 24 units which was my suggestion.  Continue to monitor glucose readings. Discussed to work on better snacking choices.   Follow up in 1-2 months.

## 2022-03-07 DIAGNOSIS — E113313 Type 2 diabetes mellitus with moderate nonproliferative diabetic retinopathy with macular edema, bilateral: Secondary | ICD-10-CM | POA: Diagnosis not present

## 2022-03-07 LAB — HM DIABETES EYE EXAM

## 2022-03-16 ENCOUNTER — Ambulatory Visit: Payer: Medicare HMO | Admitting: Internal Medicine

## 2022-03-16 ENCOUNTER — Encounter: Payer: Self-pay | Admitting: Primary Care

## 2022-03-16 ENCOUNTER — Ambulatory Visit (INDEPENDENT_AMBULATORY_CARE_PROVIDER_SITE_OTHER)
Admission: RE | Admit: 2022-03-16 | Discharge: 2022-03-16 | Disposition: A | Discharge status: Home / Self Care | Payer: Medicare HMO | Source: Ambulatory Visit | Attending: Primary Care | Admitting: Primary Care

## 2022-03-16 ENCOUNTER — Ambulatory Visit (INDEPENDENT_AMBULATORY_CARE_PROVIDER_SITE_OTHER): Payer: Medicare HMO | Admitting: Primary Care

## 2022-03-16 VITALS — BP 142/88 | HR 70 | Temp 98.1°F | Ht 63.0 in | Wt 125.0 lb

## 2022-03-16 DIAGNOSIS — M5441 Lumbago with sciatica, right side: Secondary | ICD-10-CM

## 2022-03-16 DIAGNOSIS — M545 Low back pain, unspecified: Secondary | ICD-10-CM | POA: Diagnosis not present

## 2022-03-16 DIAGNOSIS — M546 Pain in thoracic spine: Secondary | ICD-10-CM | POA: Diagnosis not present

## 2022-03-16 DIAGNOSIS — M25551 Pain in right hip: Secondary | ICD-10-CM | POA: Diagnosis not present

## 2022-03-16 DIAGNOSIS — M25552 Pain in left hip: Secondary | ICD-10-CM | POA: Diagnosis not present

## 2022-03-16 HISTORY — DX: Pain in thoracic spine: M54.6

## 2022-03-16 HISTORY — DX: Lumbago with sciatica, right side: M54.41

## 2022-03-16 MED ORDER — CYCLOBENZAPRINE HCL 5 MG PO TABS: 5.0000 mg | ORAL_TABLET | Freq: Three times a day (TID) | ORAL | 0 refills | Status: DC | PRN

## 2022-03-16 MED ORDER — KETOROLAC TROMETHAMINE 60 MG/2ML IM SOLN
60.0000 mg | Freq: Once | INTRAMUSCULAR | Status: AC
  Administered 2022-03-16: 60 mg via INTRAMUSCULAR

## 2022-03-16 NOTE — Assessment & Plan Note (Signed)
Post traumatic.  Concerning, need to evaluate for compression fracture. Checking plain films of the thoracic spine today.  Toradol 60 mg IM provided today. Start cyclobenzaprine 5 mg 3 times daily as needed, drowsiness precautions provided.  Continue Tylenol, recommended more regimented dosing.

## 2022-03-16 NOTE — Progress Notes (Signed)
Subjective:    Patient ID: Laura Ferguson, female    DOB: 1932-06-03, 88 y.o.   MRN: [id number]  Fall Pertinent negatives include no headaches or numbness.    Laura Ferguson is a very pleasant 89 y.o. female with a history of CAD, hypertension, type 2 diabetes, hyperlipidemia, anxiety depression who presents today to discuss a fall.  She fell one week ago at home. She was in the bathroom getting ready for bed, she stepped backwards and lost balance, fell backwards landing on her buttocks. She did hit the back of her head on the bathroom door. Was able to get up immediately after the fall. She did not seek medical attention just after the fall.   Since her fall she's noticed continued lower thoracic and upper lumbar back pain. Her pain will radiate to her right lower extremity down her knee. Her pain is worse with movement.   She denies numbness, weakness, headaches. She's remained active at home despite her pain.   She's taken Tylenol intermittently with improvement in pain. She's sleeping well as she's taking Tylenol at bedtime. She was once managed on a muscle relaxer, doesn't have any remaining.    Review of Systems  Musculoskeletal:  Positive for arthralgias and back pain.  Neurological:  Negative for dizziness, numbness and headaches.         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  Substance and Sexual Activity   Alcohol use: No    Alcohol/week: 0.0 standard drinks of alcohol   Drug use: No   Sexual activity: Not on file  Other Topics Concern   Not on file  Social History Narrative   Married.   Retired.   Works as a Building control surveyor.    Enjoys helping other.    Social Determinants of Health   Financial Resource Strain: Low Risk  (02/09/2022)   Overall Financial Resource Strain (CARDIA)    Difficulty of Paying Living Expenses: Not hard at all  Food Insecurity: No Food Insecurity (02/09/2022)   Hunger Vital Sign    Worried About Running Out of Food in the Last Year: Never true    Ran Out of Food in the Last Year: Never true  Transportation Needs: No Transportation Needs (02/09/2022)   PRAPARE - Hydrologist (Medical): No    Lack of Transportation (Non-Medical): No  Physical Activity: Inactive (02/08/2021)   Exercise Vital Sign    Days of Exercise per Week: 0 days    Minutes of Exercise per Session: 0 min  Stress: No Stress Concern Present (02/09/2022)   [employer] of  Occupational Health - Occupational Stress Questionnaire    Feeling of Stress : Only a little  Social Connections: Moderately Isolated (02/09/2022)   Social Connection and Isolation Panel [NHANES]    Frequency of Communication with Friends and Family: More than three times a week    Frequency of Social Gatherings with Friends and Family: More than three times a week    Attends Religious Services: More than 4 times per year    Active Member of [employer] or Organizations: No    Attends Archivist Meetings: Never    Marital Status: Widowed  Intimate Partner Violence: Not At Risk (02/09/2022)   Humiliation, Afraid, Rape, and Kick questionnaire    Fear of Current or Ex-Partner: No    Emotionally Abused: No    Physically Abused: No    Sexually Abused: No     Past Surgical History:  Procedure Laterality Date   CARDIAC CATHETERIZATION  05/07/2007   EF 55%, focal mild hypocontractility in mid-distal inferior wall & mid posterolateral wall; severe multivessel CAD - susequent CABGx6 (Dr. Corky Downs)   CORONARY ARTERY BYPASS GRAFT  05/09/2007   LIMA to LAD, veing to intermediate; SVG to OM1 & OM2; SVG to acute marginal & distal RCA (Dr. Roxan Hockey)   TRANSTHORACIC ECHOCARDIOGRAM  02/13/2010   EF =>55%, vigorous contraction EF 65%; LA mild-mod dilated; IV normal diameter - normal CVP; trace MR; mild TR; trace AV regurg    Family History  Problem Relation Age of Onset   Cancer Mother    Diabetes Sister    Cancer Child     No Known Allergies  Current Outpatient Medications on File Prior to Visit  Medication Sig Dispense Refill   Acetaminophen (TYLENOL 8 HOUR PO) Take 1 tablet by mouth every 8 (eight) hours as needed.      aspirin 81 MG tablet Take 81 mg by mouth daily.     Azelastine HCl 137 MCG/SPRAY SOLN As needed     Blood Glucose Monitoring Suppl (ACCU-CHEK GUIDE) w/Device KIT Use as instructed to check blood sugar. 1 kit 0   Cholecalciferol (VITAMIN D) 50 MCG (2000 UT) CAPS Take 1 capsule by mouth daily.     Continuous Blood Gluc Sensor (FREESTYLE LIBRE 3 SENSOR) MISC by Does not apply route.     ezetimibe (ZETIA) 10 MG tablet Take 1 tablet (10 mg total) by mouth daily. 90 tablet 3   fexofenadine (ALLEGRA) 180 MG tablet Take 180 mg by mouth daily.     glucose blood (ACCU-CHEK GUIDE) test strip USE TO CHECK SUGAR THREE TIMES DAILY AS DIRECTED 250 each 3   Insulin Glargine-Lixisenatide (SOLIQUA) 100-33 UNT-MCG/ML SOPN INJECT 22 UNITS SUBCUTANEOUSLY ONCE DAILY FOR DIABETES 15 mL 1   Insulin Pen Needle (BD PEN NEEDLE NANO 2ND GEN) 32G X 4 MM MISC USE 1 PEN NEEDLE WITH INSULIN PEN  4 TIMES DAILY 100 each 0   losartan (COZAAR) 100 MG tablet Take 1 tablet (100 mg total) by mouth daily. 90 tablet 2   metoprolol succinate (TOPROL-XL) 50 MG 24 hr  tablet TAKE 1 TABLET BY MOUTH IN THE MORNING AND 1/2 (ONE-HALF)  TAB IN THE EVENING 135 tablet 3   Multiple Vitamin (MULTIVITAMIN) capsule Take 1 capsule by mouth daily.     pantoprazole (PROTONIX) 40 MG tablet Take 1 tablet by mouth once daily 90 tablet 0   Polyethyl Glycol-Propyl Glycol (SYSTANE OP) Apply 2 drops to eye 2 (two) times daily.      rosuvastatin (CRESTOR) 10 MG  tablet Take 1 tablet (10 mg total) by mouth daily. for cholesterol. 90 tablet 3   TRAVATAN Z 0.004 % SOLN ophthalmic solution Place 1 drop into both eyes at bedtime.     No current facility-administered medications on file prior to visit.    BP (!) 142/88   Pulse 70   Temp 98.1 F (36.7 C) (Temporal)   Ht '5\' 3"'$  (1.6 m)   Wt 125 lb (56.7 kg)   SpO2 99%   BMI 22.14 kg/m  Objective:   Physical Exam Constitutional:      General: She is in acute distress.  Pulmonary:     Effort: Pulmonary effort is normal.  Musculoskeletal:     Thoracic back: Tenderness present. Decreased range of motion.     Lumbar back: Tenderness present. Decreased range of motion.       Back:     Comments: Ambulates with limp to right side during visit.   Neurological:     Mental Status: She is alert.           Assessment & Plan:  Acute midline thoracic back pain Assessment & Plan: Post traumatic.  Concerning, need to evaluate for compression fracture. Checking plain films of the thoracic spine today.  Toradol 60 mg IM provided today. Start cyclobenzaprine 5 mg 3 times daily as needed, drowsiness precautions provided.  Continue Tylenol, recommended more regimented dosing.  Orders: -     DG HIPS BILAT W OR W/O PELVIS 2V -     DG Thoracic Spine 2 View  Acute bilateral low back pain with right-sided sciatica Assessment & Plan: Posttraumatic.  No alarm signs on exam. Checking plain films of the lumbar spine and hips today.  IM Toradol 60 mg provided in office. Start cyclobenzaprine 5 mg 3 times daily as needed,  drowsiness precautions provided.  Continue Tylenol, recommended more regimented dosing.  Orders: -     DG Lumbar Spine 2-3 Views -     DG HIPS BILAT W OR W/O PELVIS 2V -     Cyclobenzaprine HCl; Take 1 tablet (5 mg total) by mouth 3 (three) times daily as needed for muscle spasms. May cause drowsiness.  Dispense: 30 tablet; Refill: 0        Pleas Koch, NP

## 2022-03-16 NOTE — Addendum Note (Signed)
Addended by: Pat Kocher on: 03/16/2022 12:38 PM   Modules accepted: Orders

## 2022-03-16 NOTE — Assessment & Plan Note (Addendum)
Posttraumatic.  No alarm signs on exam. Checking plain films of the lumbar spine and hips today.  IM Toradol 60 mg provided in office. Start cyclobenzaprine 5 mg 3 times daily as needed, drowsiness precautions provided.  Continue Tylenol, recommended more regimented dosing.

## 2022-03-16 NOTE — Patient Instructions (Signed)
Complete xray(s) prior to leaving today. I will notify you of your results once received.  You may take the cyclobenzaprine muscle relaxer 3 times daily as needed for muscle spasms.  This medication may cause drowsiness.  Continue Tylenol.  Take 1 pill 3 times daily as needed for pain.  Please keep me updated regarding your pain.  It was a pleasure to see you today!

## 2022-03-19 ENCOUNTER — Other Ambulatory Visit: Payer: Self-pay | Admitting: Primary Care

## 2022-03-19 ENCOUNTER — Telehealth: Payer: Self-pay

## 2022-03-19 DIAGNOSIS — S22080A Wedge compression fracture of T11-T12 vertebra, initial encounter for closed fracture: Secondary | ICD-10-CM

## 2022-03-19 NOTE — Telephone Encounter (Signed)
Yes, already aware. See result note.

## 2022-03-19 NOTE — Telephone Encounter (Signed)
Noted. Will place referral and cancel MRI order.

## 2022-03-19 NOTE — Telephone Encounter (Signed)
Patient called in and stated that she needed to speak with someone before anything is put in regarding this. Thank you!

## 2022-03-19 NOTE — Addendum Note (Signed)
Addended by: Pleas Koch on: 03/19/2022 04:16 PM   Modules accepted: Orders

## 2022-03-19 NOTE — Telephone Encounter (Signed)
Called and spoke with patient, she has changed her mind and would like to go forward with the orthopedic referral instead of doing an MRI right now. She prefers a [place] location for referral. Can cancel order for MRI as she does not want to do this before she sees orthopedics

## 2022-03-19 NOTE — Telephone Encounter (Signed)
Malachy Mood with [facility] radiology called report on thoracic spine; report is in epic and sending note to Gentry Fitz NP, [facility] Abbott pool and will take copy to [facility] CMA.

## 2022-03-24 DIAGNOSIS — E1165 Type 2 diabetes mellitus with hyperglycemia: Secondary | ICD-10-CM | POA: Diagnosis not present

## 2022-03-24 DIAGNOSIS — Z794 Long term (current) use of insulin: Secondary | ICD-10-CM | POA: Diagnosis not present

## 2022-03-27 ENCOUNTER — Telehealth: Payer: Self-pay | Admitting: Primary Care

## 2022-03-27 NOTE — Telephone Encounter (Addendum)
I spoke with pt;now pt said she feels fine; pt said low BS started on 03/26/22. Pt has lost appetite and is not eating a lot. Pt said earlier BS was 119; and now BS is 156. Pt did not take insulin this morning.pt said lowest BS this AM was 91.pt said BS has gotten as low as 69.pt has continuous BS monitor and that is how she knows BS drops since pt does not have symptoms. Pt does not have weakness, no jitteriness and no dizziness. Pt said that now BS is 156. Pt said she has a lot of stress. Pt said she knows how to manage her sugar usually. Pt is already trying to eat smaller meals more frequently. Pt has glucose tablets and sugar water for when BS goes low. UC & ED precautions given and pt voiced understanding. Pt already has appt to see Dr Diona Browner on 03/28/22 at 10:20 Sending note to Dr Bascom Levels pool.

## 2022-03-27 NOTE — Telephone Encounter (Signed)
Called and advised patients daughter Rollene Fare that we are not allowed to discuss her mothers information with her. I advised to have the patient give our office a call. Patients daughter stated she did not want me to call the patient or tell her in any way that she had called to notify us of her blood sugar dropping. I advised her to please have the patient give our office a call

## 2022-03-27 NOTE — Telephone Encounter (Signed)
[place] [facility] - Client TELEPHONE ADVICE RECORD [facility] Patient Name: Laura Ferguson Gender: Female DOB: 07/28/1932 Age: 89 Y 9 M 12 D Return Phone Number: [phone] (Primary) Address: City/ State/ Zip: [place]  [address] Client [facility] - Client Client Site [place] - Day Provider Alma Friendly - NP Contact Type Call Who Is Calling Patient / Member / Family / Caregiver Call Type Triage / Clinical Relationship To Patient Self Return Phone Number [phone] (Primary) Chief Complaint BLOOD SUGAR LOW - 60 or less Reason for Call Symptomatic / Request for [place] states she is having some low blood sugar concerns. It goes down to 84, 60 and she has been doing everything she knows to bring it back up. Her sensor says do not take orange juice. She can only take water with pure sugar. She can tell it is low, but she is still on her feet. She is 38 and she needs advice on what to do next. Her daughter has been calling but they don't have permission to talk to her. Translation No Nurse Assessment Nurse: Thad Ranger, RN, Denise Date/Time (Eastern Time): 03/27/2022 11:37:18 AM Confirm and document reason for call. If symptomatic, describe symptoms. ---Caller states she is having some low blood sugar concerns. It goes down to 84, 60 and she has been doing everything she knows to bring it back up. Her sensor says do not take orange juice. She can only take water with pure sugar. She can tell it is low, but she is still on her feet. She is 18 and she needs advice on what to do next. Her daughter has been calling but they don't have permission to talk to her. She states her BG is 119 this am. Does the patient have any new or worsening symptoms? ---Yes Will a triage be completed? ---Yes Related visit to physician within the last 2 weeks? ---No Does the PT have  any chronic conditions? (i.e. diabetes, asthma, this includes High risk factors for pregnancy, etc.) ---Yes List chronic conditions. ---IDDM Is this a behavioral health or substance abuse call? ---No PLEASE NOTE: All timestamps contained within this report are represented as [place] Standard Time. CONFIDENTIALTY NOTICE: This fax transmission is intended only for the addressee. It contains information that is legally privileged, confidential or otherwise protected from use or disclosure. If you are not the intended recipient, you are strictly prohibited from reviewing, disclosing, copying using or disseminating any of this information or taking any action in reliance on or regarding this information. If you have received this fax in error, please notify us immediately by telephone so that we can arrange for its return to [place]. Phone: [phone], Toll-Free: [phone], Fax: [phone] Page: 2 of 2 Call Id: [id number] Guidelines Guideline Title Affirmed Question Affirmed Notes Nurse Date/Time Eilene Ghazi Time) Diabetes - Low Blood Sugar [1] Caller has NONURGENT medication or insulin pump question AND [2] triager unable to answer question Thad Ranger, RN, [facility] 03/27/2022 11:38:41 AM Disp. Time Eilene Ghazi Time) Disposition Final User 03/27/2022 11:34:17 AM Send to Urgent Lyda Jester 03/27/2022 11:40:54 AM Call PCP within 24 Hours Yes Thad Ranger, RN, Langley Gauss Final Disposition 03/27/2022 11:40:54 AM Call PCP within 24 Hours Yes Carmon, RN, Yevette Edwards Disagree/Comply Comply Caller Understands Yes PreDisposition Call Doctor Care Advice Given Per Guideline CALL PCP WITHIN 24 HOURS: CALL BACK IF: * You become worse CARE ADVICE given per Diabetes - Low Blood Sugar (Adult) guideline

## 2022-03-27 NOTE — Telephone Encounter (Signed)
I see that patient is scheduled Wednesday 03/28/22 at 10:20 am.

## 2022-03-27 NOTE — Telephone Encounter (Signed)
Called patient at [phone]  Transferred call to access nurse for further triage  FYI: This call has been transferred to Access Nurse. Once the result note has been entered staff can address the message at that time.  Patient called in with the following symptoms:  Red Word: low blood sugar concerns   Please advise at [place]  Message is routed to Provider Pool and [facility] Triage   Patient needs an updated DPR at next visit

## 2022-03-27 NOTE — Telephone Encounter (Signed)
Pt's daughter, Rollene Fare, called requesting a call back from [facility] to discuss the pt's Blood Sugar levels. Rollene Fare stated the pt's blood sugar level was 90 on today, 3/12 & on yesterday, 3/11, it dropped down to 80. Rollene Fare states she's worried because the pt doesn't let anyone know about her low Blood Sugar levels & the pt is private about her health, the pt doesn't want everyone knowing what's going on. Call back # [phone]

## 2022-03-27 NOTE — Telephone Encounter (Signed)
Noted  

## 2022-03-27 NOTE — Telephone Encounter (Signed)
Just now seeing [name] message from the patient. Attempted to give the patient a call back, unable to reach. Left voicemail for her to call the office back.

## 2022-03-27 NOTE — Telephone Encounter (Signed)
Yes ma'am appt is scheduled for 03/28/22 at 10:20. Confirmed with pt.

## 2022-03-27 NOTE — Telephone Encounter (Signed)
Patient's number is trying to get her blood sugar normal  Has not taken insulin yet  Blood sugar currently 104  Doesn't stay   Patient ate boiled eggs, drank a little soda to help bring BS to 94  Advised patient Laura Ferguson was out of the office, but that someone would follow-up with her

## 2022-03-28 ENCOUNTER — Ambulatory Visit (INDEPENDENT_AMBULATORY_CARE_PROVIDER_SITE_OTHER): Payer: Medicare HMO | Admitting: Family Medicine

## 2022-03-28 ENCOUNTER — Encounter: Payer: Self-pay | Admitting: Family Medicine

## 2022-03-28 VITALS — BP 118/64 | HR 88 | Temp 97.7°F | Ht 63.0 in | Wt 123.0 lb

## 2022-03-28 DIAGNOSIS — E11319 Type 2 diabetes mellitus with unspecified diabetic retinopathy without macular edema: Secondary | ICD-10-CM

## 2022-03-28 DIAGNOSIS — Z794 Long term (current) use of insulin: Secondary | ICD-10-CM

## 2022-03-28 LAB — POCT GLYCOSYLATED HEMOGLOBIN (HGB A1C): Hemoglobin A1C: 8.6 % — AB (ref 4.0–5.6)

## 2022-03-28 NOTE — Progress Notes (Signed)
Patient ID: NASHAE Ferguson, female    DOB: 11-22-1932, 89 y.o.   MRN: [id number]  This visit was conducted in person.  BP 118/64   Pulse 88   Temp 97.7 F (36.5 C) (Temporal)   Ht '5\' 3"'$  (1.6 m)   Wt 123 lb (55.8 kg)   SpO2 98%   BMI 21.79 kg/m    CC:  Chief Complaint  Patient presents with   Blood Sugar Problem    See phone note.     Subjective:   HPI: Laura Ferguson is a 89 y.o. female presenting on 03/28/2022 for Blood Sugar Problem (See phone note. )  History of type 2 DM: She presents today with  recent low blood sugars starting 3/12 but  per pt the low was 91, has not had anything less than 60.  She has decreased  her appetite and is not eating a lot. BS 91-119  yesterday Has continuous glucose monitor. Yesterday she held her [place]. This AM it was 397.   Per CGM.Marland Kitchen last 90 days average 12 am 215, 12pm 246, NOTHING < 60 No new infection, fell 3 weeks ago, no current back pain.   No weakness, no jitteriness, no dizziness. No CP, no SOB.  She is on glargine/lixisenatide 100/33 unt/mcg.. on 24 units daily.   Followed by Dr. Cruzita Lederer in past.. reviewed last OV note from 11/2021.Marland Kitchen  At that office visit given she was using her medication as needed, endocrine felt that endocrinology appointments were futile and recommended follow-up with PCP.   First she states she is not taking daily [place] but later states she is [place] daily since she last saw her PCP Anda Kraft.. until held it yesterday for low/normal.  Did take it today 24 units  Wt Readings from Last 3 Encounters:  03/28/22 123 lb (55.8 kg)  03/16/22 125 lb (56.7 kg)  03/02/22 127 lb (57.6 kg)   Lab Results  Component Value Date   HGBA1C 8.6 (A) 03/28/2022    Relevant past medical, surgical, family and social history reviewed and updated as indicated. Interim medical history since our last visit reviewed. Allergies and medications reviewed and updated. Outpatient Medications Prior to Visit  Medication Sig  Dispense Refill   Acetaminophen (TYLENOL 8 HOUR PO) Take 1 tablet by mouth every 8 (eight) hours as needed.      aspirin 81 MG tablet Take 81 mg by mouth daily.     Azelastine HCl 137 MCG/SPRAY SOLN As needed     Blood Glucose Monitoring Suppl (ACCU-CHEK GUIDE) w/Device KIT Use as instructed to check blood sugar. 1 kit 0   Cholecalciferol (VITAMIN D) 50 MCG (2000 UT) CAPS Take 1 capsule by mouth daily.     Continuous Blood Gluc Sensor (FREESTYLE LIBRE 3 SENSOR) MISC by Does not apply route.     cyclobenzaprine (FLEXERIL) 5 MG tablet Take 1 tablet (5 mg total) by mouth 3 (three) times daily as needed for muscle spasms. May cause drowsiness. 30 tablet 0   ezetimibe (ZETIA) 10 MG tablet Take 1 tablet (10 mg total) by mouth daily. 90 tablet 3   fexofenadine (ALLEGRA) 180 MG tablet Take 180 mg by mouth daily.     glucose blood (ACCU-CHEK GUIDE) test strip USE TO CHECK SUGAR THREE TIMES DAILY AS DIRECTED 250 each 3   Insulin Glargine-Lixisenatide (SOLIQUA) 100-33 UNT-MCG/ML SOPN INJECT 22 UNITS SUBCUTANEOUSLY ONCE DAILY FOR DIABETES 15 mL 1   Insulin Pen Needle (BD PEN NEEDLE NANO 2ND GEN)  32G X 4 MM MISC USE 1 PEN NEEDLE WITH INSULIN PEN  4 TIMES DAILY 100 each 0   losartan (COZAAR) 100 MG tablet Take 1 tablet (100 mg total) by mouth daily. 90 tablet 2   metoprolol succinate (TOPROL-XL) 50 MG 24 hr tablet TAKE 1 TABLET BY MOUTH IN THE MORNING AND 1/2 (ONE-HALF)  TAB IN THE EVENING 135 tablet 3   Multiple Vitamin (MULTIVITAMIN) capsule Take 1 capsule by mouth daily.     pantoprazole (PROTONIX) 40 MG tablet Take 1 tablet by mouth once daily 90 tablet 0   Polyethyl Glycol-Propyl Glycol (SYSTANE OP) Apply 2 drops to eye 2 (two) times daily.      rosuvastatin (CRESTOR) 10 MG tablet Take 1 tablet (10 mg total) by mouth daily. for cholesterol. 90 tablet 3   TRAVATAN Z 0.004 % SOLN ophthalmic solution Place 1 drop into both eyes at bedtime.     No facility-administered medications prior to visit.     Per  HPI unless specifically indicated in ROS section below Review of Systems  Constitutional:  Negative for fatigue and fever.  HENT:  Negative for congestion.   Eyes:  Negative for pain.  Respiratory:  Negative for cough and shortness of breath.   Cardiovascular:  Negative for chest pain, palpitations and leg swelling.  Gastrointestinal:  Negative for abdominal pain.  Genitourinary:  Negative for dysuria and vaginal bleeding.  Musculoskeletal:  Negative for back pain.  Neurological:  Negative for syncope, light-headedness and headaches.  Psychiatric/Behavioral:  Negative for dysphoric mood.    Objective:  BP 118/64   Pulse 88   Temp 97.7 F (36.5 C) (Temporal)   Ht '5\' 3"'$  (1.6 m)   Wt 123 lb (55.8 kg)   SpO2 98%   BMI 21.79 kg/m   Wt Readings from Last 3 Encounters:  03/28/22 123 lb (55.8 kg)  03/16/22 125 lb (56.7 kg)  03/02/22 127 lb (57.6 kg)      Physical Exam Constitutional:      General: She is not in acute distress.    Appearance: Normal appearance. She is well-developed. She is not ill-appearing or toxic-appearing.  HENT:     Head: Normocephalic.     Right Ear: Hearing, tympanic membrane, ear canal and external ear normal. Tympanic membrane is not erythematous, retracted or bulging.     Left Ear: Hearing, tympanic membrane, ear canal and external ear normal. Tympanic membrane is not erythematous, retracted or bulging.     Nose: No mucosal edema or rhinorrhea.     Right Sinus: No maxillary sinus tenderness or frontal sinus tenderness.     Left Sinus: No maxillary sinus tenderness or frontal sinus tenderness.     Mouth/Throat:     Pharynx: Uvula midline.  Eyes:     General: Lids are normal. Lids are everted, no foreign bodies appreciated.     Conjunctiva/sclera: Conjunctivae normal.     Pupils: Pupils are equal, round, and reactive to light.  Neck:     Thyroid: No thyroid mass or thyromegaly.     Vascular: No carotid bruit.     Trachea: Trachea normal.   Cardiovascular:     Rate and Rhythm: Normal rate and regular rhythm.     Pulses: Normal pulses.     Heart sounds: Normal heart sounds, S1 normal and S2 normal. No murmur heard.    No friction rub. No gallop.  Pulmonary:     Effort: Pulmonary effort is normal. No tachypnea or respiratory distress.  Breath sounds: Normal breath sounds. No decreased breath sounds, wheezing, rhonchi or rales.  Abdominal:     General: Bowel sounds are normal.     Palpations: Abdomen is soft.     Tenderness: There is no abdominal tenderness.  Musculoskeletal:     Cervical back: Normal range of motion and neck supple.  Skin:    General: Skin is warm and dry.     Findings: No rash.  Neurological:     Mental Status: She is alert.  Psychiatric:        Mood and Affect: Mood is not anxious or depressed.        Speech: Speech normal.        Behavior: Behavior normal. Behavior is cooperative.        Thought Content: Thought content normal.        Judgment: Judgment normal.       Results for orders placed or performed in visit on 03/28/22  POCT glycosylated hemoglobin (Hb A1C)  Result Value Ref Range   Hemoglobin A1C 8.6 (A) 4.0 - 5.6 %   HbA1c POC (<> result, manual entry)     HbA1c, POC (prediabetic range)     HbA1c, POC (controlled diabetic range)      Assessment and Plan  Type 2 diabetes mellitus with retinopathy, with long-term current use of insulin, macular edema presence unspecified, unspecified laterality, unspecified retinopathy severity ([place]) Assessment & Plan: Associated with retinopathy per past eye exam  Overall diabetes control still seems poor.  She had 1 day of normal values that she was considering low.  No true lows less than 60. I again encouraged her to make sure she is taking this Soliqua 24 units daily as instructed by her PCP. There is no clear sign of infection.  She will follow-up as planned with PCP in 1 month.  Orders: -     POCT glycosylated hemoglobin (Hb  A1C)    Return in about 4 weeks (around 04/25/2022) for diabetes follow up [place].   Eliezer Lofts, MD

## 2022-03-28 NOTE — Assessment & Plan Note (Signed)
Associated with retinopathy per past eye exam  Overall diabetes control still seems poor.  She had 1 day of normal values that she was considering low.  No true lows less than 60. I again encouraged her to make sure she is taking this Soliqua 24 units daily as instructed by her PCP. There is no clear sign of infection.  She will follow-up as planned with PCP in 1 month.

## 2022-04-04 DIAGNOSIS — M4854XA Collapsed vertebra, not elsewhere classified, thoracic region, initial encounter for fracture: Secondary | ICD-10-CM | POA: Diagnosis not present

## 2022-04-06 ENCOUNTER — Telehealth: Payer: Self-pay | Admitting: Cardiovascular Disease

## 2022-04-06 ENCOUNTER — Other Ambulatory Visit: Payer: Self-pay | Admitting: Primary Care

## 2022-04-06 DIAGNOSIS — E785 Hyperlipidemia, unspecified: Secondary | ICD-10-CM

## 2022-04-06 MED ORDER — ROSUVASTATIN CALCIUM 10 MG PO TABS: 10.0000 mg | ORAL_TABLET | Freq: Every day | ORAL | 3 refills | Status: DC

## 2022-04-06 NOTE — Telephone Encounter (Signed)
*  STAT* If patient is at the pharmacy, call can be transferred to refill team.   1. Which medications need to be refilled? (please list name of each medication and dose if known) rosuvastatin (CRESTOR) 10 MG tablet   2. Which pharmacy/location (including street and city if local pharmacy) is medication to be sent to? [place], [place]   3. Do they need a 30 day or 90 day supply? 90  **Patient needs medication ASAP. Patient totally out of medication**

## 2022-04-06 NOTE — Telephone Encounter (Signed)
Refill for Rosuvastatin has been sent to [facility], per pt's request.

## 2022-04-12 DIAGNOSIS — H903 Sensorineural hearing loss, bilateral: Secondary | ICD-10-CM | POA: Diagnosis not present

## 2022-04-12 DIAGNOSIS — R42 Dizziness and giddiness: Secondary | ICD-10-CM | POA: Diagnosis not present

## 2022-04-24 DIAGNOSIS — E1165 Type 2 diabetes mellitus with hyperglycemia: Secondary | ICD-10-CM | POA: Diagnosis not present

## 2022-04-24 DIAGNOSIS — Z794 Long term (current) use of insulin: Secondary | ICD-10-CM | POA: Diagnosis not present

## 2022-04-25 ENCOUNTER — Encounter: Payer: Self-pay | Admitting: Primary Care

## 2022-04-25 ENCOUNTER — Ambulatory Visit (INDEPENDENT_AMBULATORY_CARE_PROVIDER_SITE_OTHER): Payer: Medicare HMO | Admitting: Primary Care

## 2022-04-25 VITALS — BP 138/86 | HR 97 | Temp 97.2°F | Ht 63.0 in | Wt 124.0 lb

## 2022-04-25 DIAGNOSIS — Z794 Long term (current) use of insulin: Secondary | ICD-10-CM

## 2022-04-25 DIAGNOSIS — E11319 Type 2 diabetes mellitus with unspecified diabetic retinopathy without macular edema: Secondary | ICD-10-CM

## 2022-04-25 NOTE — Progress Notes (Signed)
Subjective:    Patient ID: Laura Ferguson, female    DOB: [birth year], 89 y.o.   MRN: [id number]  HPI  Laura Ferguson is a very pleasant 89 y.o. female with a history of type 2 diabetes, CAD, hypertension, hyperlipidemia who presents today for follow up of diabetes.  Evaluated by Dr. Ermalene Searing in March 2024 for abnormal glucose readings, admitted to taking her prescribed regimen as needed and inconsistently. She was advised to follow her diabetes regimen as prescribed and follow up with PCP.   Current medications include: Soliqua 100-33, 22 units daily.   Today she admits that she is taking anywhere from 20-24 units daily of her [place].   She is checking her blood glucose continuously and is getting readings of:  AM fasting: 120's Afternoon: 140-200's Bedtime: 150's   She still sees fluctuations in the 250's.  Lowest reading: 60's  Last A1C: 8.6 in March 2024, 8.4 in November 2023 Last Eye Exam: UTD Last Foot Exam: UTD Pneumonia Vaccination: UTD Urine Microalbumin:  UTD Statin: rosuvastatin   Dietary changes since last visit: Snacks throughout the day. She has stopped drinking orange juice.    Exercise: None  BP Readings from Last 3 Encounters:  04/25/22 138/86  03/28/22 118/64  03/16/22 (!) 142/88       Review of Systems  Respiratory:  Negative for shortness of breath.   Cardiovascular:  Negative for chest pain.  Neurological:  Negative for dizziness.         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  Substance and Sexual Activity   Alcohol use: No    Alcohol/week: 0.0 standard drinks of alcohol   Drug use: No   Sexual activity: Not on file  Other Topics Concern   Not on file  Social History Narrative   Married.   Retired.   Works as a Engineer, structural.    Enjoys helping other.    Social Determinants of Health   Financial Resource Strain: Low Risk  (02/09/2022)   Overall Financial Resource Strain (CARDIA)    Difficulty of Paying Living Expenses: Not hard at all  Food Insecurity: No Food Insecurity (02/09/2022)   Hunger Vital Sign    Worried About Running Out of Food in the Last Year: Never true    Ran Out of Food in the Last Year: Never true  Transportation Needs: No Transportation Needs (02/09/2022)   PRAPARE - Administrator, Civil Service (Medical): No    Lack of Transportation (Non-Medical): No  Physical Activity: Inactive (02/08/2021)   Exercise Vital Sign    Days of Exercise per Week: 0  days    Minutes of Exercise per Session: 0 min  Stress: No Stress Concern Present (02/09/2022)   [employer] of Occupational Health - Occupational Stress Questionnaire    Feeling of Stress : Only a little  Social Connections: Moderately Isolated (02/09/2022)   Social Connection and Isolation Panel [NHANES]    Frequency of Communication with Friends and Family: More than three times a week    Frequency of Social Gatherings with Friends and Family: More than three times a week    Attends Religious Services: More than 4 times per year    Active Member of [employer] or Organizations: No    Attends Banker Meetings: Never    Marital Status: Widowed  Intimate Partner Violence: Not At Risk (02/09/2022)   Humiliation, Afraid, Rape, and Kick  questionnaire    Fear of Current or Ex-Partner: No    Emotionally Abused: No    Physically Abused: No    Sexually Abused: No    Past Surgical History:  Procedure Laterality Date   CARDIAC CATHETERIZATION  05/07/2007   EF 55%, focal mild hypocontractility in mid-distal inferior wall & mid posterolateral wall; severe multivessel CAD - susequent CABGx6 (Dr. Bishop Limbo)   CORONARY ARTERY BYPASS GRAFT  05/09/2007   LIMA to LAD, veing to intermediate; SVG to OM1 & OM2; SVG to acute marginal & distal RCA (Dr. Dorris Fetch)   TRANSTHORACIC ECHOCARDIOGRAM  02/13/2010   EF =>55%, vigorous contraction EF 65%; LA mild-mod dilated; IV normal diameter - normal CVP; trace MR; mild TR; trace AV regurg    Family History  Problem Relation Age of Onset   Cancer Mother    Diabetes Sister    Cancer Child     No Known Allergies  Current Outpatient Medications on File Prior to Visit  Medication Sig Dispense Refill   Acetaminophen (TYLENOL 8 HOUR PO) Take 1 tablet by mouth every 8 (eight) hours as needed.      aspirin 81 MG tablet Take 81 mg by mouth daily.     Blood Glucose Monitoring Suppl (ACCU-CHEK GUIDE) w/Device KIT Use as instructed to check blood sugar. 1 kit 0   Cholecalciferol (VITAMIN D) 50 MCG (2000 UT) CAPS Take 1 capsule by mouth daily.     Continuous Blood Gluc Sensor (FREESTYLE LIBRE 3 SENSOR) MISC by Does not apply route.     cyclobenzaprine (FLEXERIL) 5 MG tablet Take 1 tablet (5 mg total) by mouth 3 (three) times daily as needed for muscle spasms. May cause drowsiness. 30 tablet 0   ezetimibe (ZETIA) 10 MG tablet Take 1 tablet (10 mg total) by mouth daily. 90 tablet 3   fexofenadine (ALLEGRA) 180 MG tablet Take 180 mg by mouth daily.     glucose blood (ACCU-CHEK GUIDE) test strip USE TO CHECK SUGAR THREE TIMES DAILY AS DIRECTED 250 each 3   Insulin Glargine-Lixisenatide (SOLIQUA) 100-33 UNT-MCG/ML SOPN INJECT 22 UNITS SUBCUTANEOUSLY ONCE DAILY FOR DIABETES 15 mL 1   Insulin Pen Needle (BD  PEN NEEDLE NANO 2ND GEN) 32G X 4 MM MISC USE 1 PEN NEEDLE WITH INSULIN PEN  4 TIMES DAILY 100 each 0   losartan (COZAAR) 100 MG tablet Take 1 tablet (100 mg total) by mouth daily. 90 tablet 2   metoprolol succinate (TOPROL-XL) 50 MG 24 hr tablet TAKE 1 TABLET BY MOUTH IN THE MORNING AND 1/2 (ONE-HALF)  TAB IN THE EVENING 135 tablet 3   Multiple Vitamin (MULTIVITAMIN) capsule Take 1 capsule by mouth daily.  pantoprazole (PROTONIX) 40 MG tablet Take 1 tablet by mouth once daily 90 tablet 0   Polyethyl Glycol-Propyl Glycol (SYSTANE OP) Apply 2 drops to eye 2 (two) times daily.      rosuvastatin (CRESTOR) 10 MG tablet Take 1 tablet (10 mg total) by mouth daily. for cholesterol. 90 tablet 3   TRAVATAN Z 0.004 % SOLN ophthalmic solution Place 1 drop into both eyes at bedtime.     Azelastine HCl 137 MCG/SPRAY SOLN As needed (Patient not taking: Reported on 04/25/2022)     No current facility-administered medications on file prior to visit.    BP 138/86   Pulse 97   Temp (!) 97.2 F (36.2 C) (Temporal)   Ht 5\' 3"  (1.6 m)   Wt 124 lb (56.2 kg)   SpO2 96%   BMI 21.97 kg/m  Objective:   Physical Exam Cardiovascular:     Rate and Rhythm: Normal rate and regular rhythm.  Pulmonary:     Effort: Pulmonary effort is normal.     Breath sounds: Normal breath sounds.  Musculoskeletal:     Cervical back: Neck supple.  Skin:    General: Skin is warm and dry.           Assessment & Plan:  Type 2 diabetes mellitus with retinopathy, with long-term current use of insulin, macular edema presence unspecified, unspecified laterality, unspecified retinopathy severity Assessment & Plan: Glucose readings seem improved. Elimination of orange juice may be contributing.  Continue Soliqua 100-33, 20-24 units daily.  Follow up in 2 months.         Doreene NestKatherine K Ellamarie Naeve, NP

## 2022-04-25 NOTE — Assessment & Plan Note (Signed)
Glucose readings seem improved. Elimination of orange juice may be contributing.  Continue Soliqua 100-33, 20-24 units daily.  Follow up in 2 months.

## 2022-04-25 NOTE — Patient Instructions (Addendum)
Continue Soliqua at 20-24 units everyday for diabetes.  Stop sugary drinks. And avoid fatty/greasy foods.   Schedule a follow up visit for mid June for diabetes check.  It was a pleasure to see you today!

## 2022-04-26 DIAGNOSIS — M4854XA Collapsed vertebra, not elsewhere classified, thoracic region, initial encounter for fracture: Secondary | ICD-10-CM | POA: Diagnosis not present

## 2022-05-02 ENCOUNTER — Encounter: Payer: Medicare HMO | Admitting: Primary Care

## 2022-05-07 DIAGNOSIS — H35373 Puckering of macula, bilateral: Secondary | ICD-10-CM | POA: Diagnosis not present

## 2022-05-07 DIAGNOSIS — H401132 Primary open-angle glaucoma, bilateral, moderate stage: Secondary | ICD-10-CM | POA: Diagnosis not present

## 2022-05-07 DIAGNOSIS — E113313 Type 2 diabetes mellitus with moderate nonproliferative diabetic retinopathy with macular edema, bilateral: Secondary | ICD-10-CM | POA: Diagnosis not present

## 2022-05-07 DIAGNOSIS — H43813 Vitreous degeneration, bilateral: Secondary | ICD-10-CM | POA: Diagnosis not present

## 2022-05-07 DIAGNOSIS — H35033 Hypertensive retinopathy, bilateral: Secondary | ICD-10-CM | POA: Diagnosis not present

## 2022-05-21 ENCOUNTER — Telehealth: Payer: Self-pay

## 2022-05-21 DIAGNOSIS — Z139 Encounter for screening, unspecified: Secondary | ICD-10-CM

## 2022-05-21 NOTE — Patient Outreach (Signed)
  Care Coordination   Initial Visit Note   05/21/2022 Name: Laura Ferguson MRN: [id number] DOB: 1932-09-28  Laura Ferguson is a 89 y.o. year old female who sees Laura Nest, NP for primary care. I spoke with  Laura Ferguson by phone today.  What matters to the patients health and wellness today?  Patient denies having any nursing needs. She reports she would like to have additional information for transportation assistance.     Goals Addressed             This Visit's Progress    care coordination activities/ community resource needs.       Interventions Today    Flowsheet Row Most Recent Value  Chronic Disease   Chronic disease during today's visit Diabetes  General Interventions   General Interventions Discussed/Reviewed [facility], General Interventions Reviewed  [Care coordination services discussed. Completed SDOH survey.  Referred for transportation assistance. Assessed for nuring needs.  Discussed and encouraged vaccines]              SDOH assessments and interventions completed:  Yes  SDOH Interventions Today    Flowsheet Row Most Recent Value  SDOH Interventions   Food Insecurity Interventions Intervention Not Indicated  Housing Interventions Intervention Not Indicated  Transportation Interventions Other (Comment)  [patient would like to have back up related to transportation]        Care Coordination Interventions:  Yes, provided   Follow up plan: No further intervention required.   Encounter Outcome:  Pt. Visit Completed   Laura Ina RN,BSN,CCM [facility] Care Coordination [phone] direct line

## 2022-05-22 ENCOUNTER — Telehealth: Payer: Self-pay

## 2022-05-22 NOTE — Progress Notes (Signed)
Care Management & Coordination Services Pharmacy Team  Reason for Encounter: Appointment Reminder  Contacted patient to confirm telephone appointment with Al Corpus, PharmD on 05/25/2022 at 1:30.  Spoke with patient on 05/22/2022   Do you have any problems getting your medications? No  What is your top health concern you would like to discuss at your upcoming visit? None  Have you seen any other providers since your last visit with PCP? Yes  Star Rating Drugs:  Medication:  Last Fill: Day Supply Losartan 100 mg 03/29/2022 90 Rosuvastatin 10 mg 04/06/2022 90  Care Gaps: Annual wellness visit in last year? Yes 02/09/2022  If Diabetic: Last eye exam / retinopathy screening: Up to date Last diabetic foot exam: Up to date  Al Corpus, PharmD notified  Claudina Lick, [place] Clinical Pharmacy Assistant [phone]

## 2022-05-23 ENCOUNTER — Telehealth: Payer: Self-pay

## 2022-05-23 NOTE — Telephone Encounter (Signed)
   Telephone encounter was:  Unsuccessful.  05/23/2022 Name: Laura Ferguson MRN: [id number] DOB: Oct 22, 1932  Unsuccessful outbound call made today to assist with:  Transportation Needs   Outreach Attempt:  1st Attempt  A HIPAA compliant voice message was left requesting a return call.  Instructed patient to call back at [phone].  [name] [facility]  [facility] Population Health Community Resource Care Guide   [email or web address]  ?? [phone]   Website: [email or web address]  [email or web address]

## 2022-05-24 ENCOUNTER — Telehealth: Payer: Self-pay

## 2022-05-24 ENCOUNTER — Other Ambulatory Visit: Payer: Self-pay | Admitting: Cardiovascular Disease

## 2022-05-24 DIAGNOSIS — Z794 Long term (current) use of insulin: Secondary | ICD-10-CM | POA: Diagnosis not present

## 2022-05-24 DIAGNOSIS — E1165 Type 2 diabetes mellitus with hyperglycemia: Secondary | ICD-10-CM | POA: Diagnosis not present

## 2022-05-24 NOTE — Telephone Encounter (Signed)
   Telephone encounter was:  Unsuccessful.  05/24/2022 Name: Laura Ferguson MRN: [id number] DOB: 25-Jun-1932  Unsuccessful outbound call made today to assist with:  Transportation Needs   Outreach Attempt:  3rd Attempt.  Referral closed unable to contact patient.  A HIPAA compliant voice message was left requesting a return call.  Instructed patient to call back at [phone].Left message confirming the [employer] transportation number [phone].  Romi Rathel [facility]  [facility] Population Health Community Resource Care Guide   [email or web address]  ?? [phone]   Website: [email or web address]  [email or web address]

## 2022-05-24 NOTE — Telephone Encounter (Signed)
Received RX request for refill on Pantoprazole will Dr. Tresa Endo refill or should pt get from PCP?

## 2022-05-24 NOTE — Telephone Encounter (Signed)
   Telephone encounter was:  Successful.  05/24/2022 Name: YUE VOLKMER MRN: [id number] DOB: 1932-12-12  MIRCA MYNATT is a 89 y.o. year old female who is a primary care patient of Doreene Nest, NP . The community resource team was consulted for assistance with Transportation Needs   Care guide performed the following interventions: Performed three way call with patient and [employer] to verify transportation benefit. Patient has transportation benefit with [employer] [phone] 24 one-way trips with 3 business day notice before appointment.  I also verified patient's home address to mail [employer] [place] application.   Follow Up Plan:  No further follow up planned at this time. The patient has been provided with needed resources.  Fredi Hurtado [facility]  [facility] Population Health Community Resource Care Guide   [email or web address]  ?? [phone]   Website: [email or web address]  [email or web address]

## 2022-05-25 ENCOUNTER — Ambulatory Visit: Payer: Medicare HMO | Admitting: Pharmacist

## 2022-05-25 ENCOUNTER — Other Ambulatory Visit: Payer: Self-pay | Admitting: Primary Care

## 2022-05-25 DIAGNOSIS — E11319 Type 2 diabetes mellitus with unspecified diabetic retinopathy without macular edema: Secondary | ICD-10-CM

## 2022-05-25 DIAGNOSIS — N1832 Chronic kidney disease, stage 3b: Secondary | ICD-10-CM | POA: Diagnosis not present

## 2022-05-25 MED ORDER — FREESTYLE LIBRE 3 SENSOR MISC: 1 refills | Status: DC

## 2022-05-25 NOTE — Progress Notes (Signed)
Care Management & Coordination Services Pharmacy Note  05/25/2022 Name:  Laura Ferguson MRN:  [id number] DOB:  1932-03-10  Summary: F/u visit -DM: A1c 8.6% (03/2022); pt has struggled with Laura Ferguson compliance in the past, and has been discharged from endocrine due to this -today she reports she is taking Soliqua 24 units every day; she is using [name] 3 w/ reader and denies many alarms (meaning glucose stays between 70-250 most of the time); she requests Libre 3 switch to [place] rather than DME   Recommendations/Changes made from today's visit: -No mec changes -Coordinate with PCP for Libre 3 refill to [facility]  Follow up plan: -Pharmacist follow up televisit PRN -PCP appt 07/03/22    Subjective: Laura Ferguson is an 89 y.o. year old female who is a primary patient of Laura Nest, NP.  The care coordination team was consulted for assistance with disease management and care coordination needs.    Engaged with patient by telephone for follow up visit.  Recent office visits: 04/25/22 NP Mayra Reel OV: stop sugary drinks. Continue Soliqua 20-24 units daily. F/u mid-June  03/28/22 Dr Ermalene Searing OV: A1c 8.6%; encouraged daily use of Soliqua 24 units. F/u PCP 1 month.  03/16/22 NP Mayra Reel OV: back pain - X ray shows compression fracture. Rx cyclobenzaprine. Order MRI.  03/02/22 NP Mayra Reel OV: too early to check A1c. Continue soliqua 22 units. F/u 1-2 months.  Recent consult visits: 02/06/22 Dr Tresa Endo (Cardiology): f/u CAD - stable. No changes.  12/14/21 Dr Elvera Lennox (Endo): f/u - A1c 8.4%; pt is taking [place] "seldom"; advised to take Soliqua 20 units every day. Discharged from endo as pt is not following advice.  Hospital visits: None in previous 6 months   Objective:  Lab Results  Component Value Date   CREATININE 1.33 (H) 09/28/2021   BUN 20 09/28/2021   GFR 36.03 (L) 09/28/2021   EGFR 41 (L) 07/14/2020   GFRNONAA 32 (L) 11/05/2018   GFRAA 38 (L) 11/05/2018   NA 142  09/28/2021   K 4.1 09/28/2021   CALCIUM 9.3 09/28/2021   CO2 26 09/28/2021   GLUCOSE 256 (H) 09/28/2021    Lab Results  Component Value Date/Time   HGBA1C 8.6 (A) 03/28/2022 10:47 AM   HGBA1C 8.4 (A) 12/14/2021 10:21 AM   HGBA1C 8.3 (H) 07/14/2020 10:19 AM   HGBA1C 11.7 (H) 04/02/2016 08:49 AM   GFR 36.03 (L) 09/28/2021 09:57 AM   GFR 33.42 (L) 04/06/2021 11:11 AM   MICROALBUR 15.8 (H) 06/27/2015 12:10 PM    Last diabetic Eye exam:  Lab Results  Component Value Date/Time   HMDIABEYEEXA Retinopathy (A) 03/07/2022 09:11 AM    Last diabetic Foot exam: No results found for: "HMDIABFOOTEX"   Lab Results  Component Value Date   CHOL 133 02/02/2022   HDL 81 02/02/2022   LDLCALC 39 02/02/2022   TRIG 63 02/02/2022   CHOLHDL 1.6 02/02/2022       Latest Ref Rng & Units 04/06/2021   11:11 AM 07/14/2020   10:19 AM 09/07/2019    8:38 AM  Hepatic Function  Total Protein 6.0 - 8.3 g/dL 7.0  7.4  6.9   Albumin 3.5 - 5.2 g/dL 4.1  4.6  3.9   AST 0 - 37 U/L 17  22  16    ALT 0 - 35 U/L 15  21  15    Alk Phosphatase 39 - 117 U/L 91  91  65   Total Bilirubin 0.2 - 1.2 mg/dL  0.6  0.5  0.5     Lab Results  Component Value Date/Time   TSH 0.935 02/02/2022 08:57 AM   TSH 0.565 07/14/2020 10:19 AM   FREET4 1.54 02/02/2022 08:57 AM       Latest Ref Rng & Units 02/02/2022    8:57 AM 07/14/2020   10:19 AM 03/02/2020   12:57 PM  CBC  WBC 3.4 - 10.8 x10E3/uL 5.8  5.5  4.9   Hemoglobin 11.1 - 15.9 g/dL 16.1  09.6  04.5   Hematocrit 34.0 - 46.6 % 36.5  38.0  36.5   Platelets 150 - 450 x10E3/uL 172  180  176.0     Lab Results  Component Value Date/Time   VITAMINB12 667 03/02/2020 12:57 PM    Clinical ASCVD: Yes  The ASCVD Risk score (Arnett DK, et al., 2019) failed to calculate for the following reasons:   The 2019 ASCVD risk score is only valid for ages 22 to 68        02/09/2022    8:37 AM 10/12/2021    9:00 AM 02/08/2021    8:20 AM  Depression screen PHQ 2/9  Decreased  Interest 1 3 0  Down, Depressed, Hopeless 0 3 1  PHQ - 2 Score 1 6 1   Altered sleeping  0   Tired, decreased energy  0   Change in appetite  1   Feeling bad or failure about yourself   1   Trouble concentrating  0   Moving slowly or fidgety/restless  0   Suicidal thoughts  0   PHQ-9 Score  8   Difficult doing work/chores  Not difficult at all      Social History   Tobacco Use  Smoking Status Never  Smokeless Tobacco Never  Tobacco Comments   quit 1960's  smoked very lightly.   BP Readings from Last 3 Encounters:  04/25/22 138/86  03/28/22 118/64  03/16/22 (!) 142/88   Pulse Readings from Last 3 Encounters:  04/25/22 97  03/28/22 88  03/16/22 70   Wt Readings from Last 3 Encounters:  04/25/22 124 lb (56.2 kg)  03/28/22 123 lb (55.8 kg)  03/16/22 125 lb (56.7 kg)   BMI Readings from Last 3 Encounters:  04/25/22 21.97 kg/m  03/28/22 21.79 kg/m  03/16/22 22.14 kg/m    No Known Allergies  Medications Reviewed Today     Reviewed by Kathyrn Sheriff, [facility] (Pharmacist) on 05/25/22 at 1339  Med List Status: <None>   Medication Order Taking? Sig Documenting Provider Last Dose Status Informant  Acetaminophen (TYLENOL 8 HOUR PO) [id number] Yes Take 1 tablet by mouth every 8 (eight) hours as needed.  [provider] Taking Active Self  aspirin 81 MG tablet [id number] Yes Take 81 mg by mouth daily. [provider] Taking Active Self  Azelastine HCl 137 MCG/SPRAY SOLN [id number] Yes As needed [provider] Taking Active   Blood Glucose Monitoring Suppl (ACCU-CHEK GUIDE) w/Device KIT [id number] Yes Use as instructed to check blood sugar. Carlus Pavlov, MD Taking Active   Cholecalciferol (VITAMIN D) 50 MCG (2000 UT) CAPS [id number] Yes Take 1 capsule by mouth daily. [provider] Taking Active   Continuous Blood Gluc Sensor (FREESTYLE LIBRE 3 SENSOR) [place] [id number] Yes by Does not apply route. [provider] Taking Active    cyclobenzaprine (FLEXERIL) 5 MG tablet [id number] Yes Take 1 tablet (5 mg total) by mouth 3 (three) times daily as needed for muscle spasms. May cause drowsiness. Vernona Rieger  K, NP Taking Active   ezetimibe (ZETIA) 10 MG tablet [id number] Yes Take 1 tablet (10 mg total) by mouth daily. Lennette Bihari, MD Taking Active   fexofenadine [facility]) 180 MG tablet [id number] Yes Take 180 mg by mouth daily. [provider] Taking Active   glucose blood (ACCU-CHEK GUIDE) test strip [id number] Yes USE TO CHECK SUGAR THREE TIMES DAILY AS DIRECTED Carlus Pavlov, MD Taking Active   Insulin Glargine-Lixisenatide [facility]) 100-33 UNT-MCG/ML Namon Cirri [id number] Yes INJECT 22 UNITS SUBCUTANEOUSLY ONCE DAILY FOR DIABETES Laura Nest, NP Taking Active   Insulin Pen Needle (BD PEN NEEDLE NANO 2ND GEN) 32G X 4 MM MISC [id number] Yes USE 1 PEN NEEDLE WITH INSULIN PEN  4 TIMES DAILY Carlus Pavlov, MD Taking Active   losartan (COZAAR) 100 MG tablet [id number] Yes Take 1 tablet (100 mg total) by mouth daily. Lennette Bihari, MD Taking Active   metoprolol succinate (TOPROL-XL) 50 MG 24 hr tablet [id number] Yes TAKE 1 TABLET BY MOUTH IN THE MORNING AND 1/2 (ONE-HALF)  TAB IN THE Sherie Don, MD Taking Active   Multiple Vitamin (MULTIVITAMIN) capsule [id number] Yes Take 1 capsule by mouth daily. [provider] Taking Active Self  pantoprazole (PROTONIX) 40 MG tablet [id number] Yes Take 1 tablet by mouth once daily Lennette Bihari, MD Taking Active   Polyethyl Glycol-Propyl Glycol [facility]) [id number] Yes Apply 2 drops to eye 2 (two) times daily.  [provider] Taking Active Self  rosuvastatin (CRESTOR) 10 MG tablet [id number] Yes Take 1 tablet (10 mg total) by mouth daily. for cholesterol. Lennette Bihari, MD Taking Active   TRAVATAN Z 0.004 % SOLN ophthalmic solution [id number] Yes Place 1 drop into both eyes at bedtime. [provider] Taking Active Self           Med  Note Kai Levins, MELISSA B   Sun Dec 05, 2014  2:09 PM)              SDOH:  (Social Determinants of Health) assessments and interventions performed: No SDOH Interventions    Flowsheet Row Telephone from 05/24/2022 in [facility] Care Coordination Telephone from 05/21/2022 in [facility] Care Coordination Clinical Support from 02/09/2022 in [facility] Chronic Care Management from 10/11/2021 in [facility] Visit from 10/13/2019 in [facility] Office Visit from 09/22/2019 in [facility] [place]  SDOH Interventions        Food Insecurity Interventions -- Intervention Not Indicated Intervention Not Indicated -- -- --  Housing Interventions -- Intervention Not Indicated Intervention Not Indicated -- -- --  Transportation Interventions Payor Benefit, SCAT (Specialized Community [employer] Transporation), Other (Comment)  [Patient has transportation benefit with [employer] and I mailed a Access [place] application] Other (Comment)  [patient would like to have back up related to transportation] Intervention Not Indicated Intervention Not Indicated -- --  Utilities Interventions -- -- Intervention Not Indicated -- -- --  Depression Interventions/Treatment  -- -- -- -- Counseling Medication  Financial Strain Interventions -- -- Intervention Not Indicated Intervention Not Indicated -- --  Stress Interventions -- -- Intervention Not Indicated -- -- --  Social Connections Interventions -- -- Intervention Not Indicated -- -- --       Medication Assistance: None required.  Patient affirms current coverage meets needs.  Medication Access: Within the past 30 days, how often has patient missed  a dose of medication? 0 Is a pillbox or other method used to improve adherence? Yes  Factors that may affect medication adherence? lack of  understanding of disease management Are meds synced by current pharmacy? No  Are meds delivered by current pharmacy? No  Does patient experience delays in picking up medications due to transportation concerns? No   Upstream Services Reviewed: Is patient disadvantaged to use [facility]?: No  Current Rx insurance plan: [employer] Name and location of Current pharmacy:  [facility] [address] [place] - [address] [place] [address] Phone: [phone] Fax: [phone]  [facility] services reviewed with patient today?: No  Patient requests to transfer care to [facility] Pharmacy?: No  Reason patient declined to change pharmacies: Not mentioned at this visit  Compliance/Adherence/Medication fill history: Care Gaps: None  Star-Rating Drugs: Rosuvastatin - PDC 93% Losartan - PDC 94%   ASSESSMENT / PLAN  Hypertension (BP goal <140/90) -Controlled - BP at goal in recent clinic visits; pt brought BP monitor to office previously, verified accuracy with manual BP -Current home readings: none available -Denies hypotensive/hypertensive symptoms -Current treatment: Losartan 100 mg daily - Appropriate, Effective, Safe, Accessible Metoprolol succinate 50 mg - 1 AM, 1/2 PM -Appropriate, Effective, Safe, Accessible -Medications previously tried: HCTZ  -Educated on BP goals and benefits of medications for prevention of heart attack, stroke and kidney damage; Proper BP monitoring technique; -Counseled to monitor BP at home daily -Recommended to continue current medication  Hyperlipidemia: (LDL goal < 70) -Controlled - LDL 39 (01/2022) at goal -CAD - HX CABG 2009 -Current treatment: Rosuvastatin 10 mg daily -  Appropriate, Effective, Safe, Accessible Ezetimibe 10 mg daily - Appropriate, Effective, Safe, Accessible Aspirin 81 mg daily - Appropriate, Effective, Safe, Accessible -Medications previously tried: n/a  -Educated on Cholesterol goals;  Benefits of statin for ASCVD risk reduction; -Recommended to continue current medication  Diabetes (A1c goal <8%) -Not ideally controlled - A1c 8.6% (03/2022) increased from previous, pt has struggled with soliqua compliance though today she reports she is taking it daily -Dx 1990s, insulin-dependent for years; pt previously saw Endocrine but was released from their care due to not following repeated advice -Current meal patterns:  drinks: cranberry juice, apple juice - discussed high sugar content in juices -Current exercise: limited -Current home glucose readings: [employer] Libre (reader); pt does not have any readings available  -Current medications: Soliqua 24 units daily -  Appropriate, Query Effective Freestyle Libre 3 w/ reader ([employer]MS)  -Medications previously tried: glipizide, metformin, Ozempic, Levemir, Novolog -Educated on A1c and blood sugar goals;Prevention and management of hypoglycemic episodes; Continuous glucose monitoring -Pt requests Freestyle Libre sent to pharmacy - [employer] should cover it this way in 2024 -Recommend to continue current medication  Chronic Kidney Disease Stage 3b  -All medications assessed for renal dosing and appropriateness in chronic kidney disease. -Recommended to continue current medication  Health Maintenance -Vaccine gaps: PPSV or PCV20; Shingrix, TDAP   Al Corpus, PharmD, [place]y [place], CPP Clinical Pharmacist Practitioner [facility] [phone]

## 2022-05-29 DIAGNOSIS — H401132 Primary open-angle glaucoma, bilateral, moderate stage: Secondary | ICD-10-CM | POA: Diagnosis not present

## 2022-05-29 DIAGNOSIS — H35033 Hypertensive retinopathy, bilateral: Secondary | ICD-10-CM | POA: Diagnosis not present

## 2022-05-29 DIAGNOSIS — E113313 Type 2 diabetes mellitus with moderate nonproliferative diabetic retinopathy with macular edema, bilateral: Secondary | ICD-10-CM | POA: Diagnosis not present

## 2022-05-29 DIAGNOSIS — H35373 Puckering of macula, bilateral: Secondary | ICD-10-CM | POA: Diagnosis not present

## 2022-05-29 DIAGNOSIS — H43813 Vitreous degeneration, bilateral: Secondary | ICD-10-CM | POA: Diagnosis not present

## 2022-06-04 DIAGNOSIS — E113312 Type 2 diabetes mellitus with moderate nonproliferative diabetic retinopathy with macular edema, left eye: Secondary | ICD-10-CM | POA: Diagnosis not present

## 2022-06-05 ENCOUNTER — Telehealth: Payer: Self-pay | Admitting: Pharmacist

## 2022-06-05 NOTE — Telephone Encounter (Signed)
Patient reports she received a call from [employer], they are asking for $109 payment for last shipment of sensors. Pt could not recall when the last shipment was but it was a couple weeks ago. She recently started getting sensors from [facility] for $75 and would like to continue this. She would like clarification on exactly what [employer] needs as she states she was not told before the shipment was sent that it would cost so much.  Pharmacy team will reach out to [employer] for clarifications.

## 2022-06-07 NOTE — Progress Notes (Signed)
Care Management & Coordination Services Pharmacy Team  Reason for Encounter:Medical Supplies cost  - patient received call from distributer   Spoke with patient on 06/07/2022   Contacted customer service with [employer] for the patient to clarify recent cost. TMS explained that [facility] changed plan this year. [employer] was behind in billing. The total amount was spread out from over the past 4 months. The total remaining owed on account is  146.52. Patient expressed disappointment with this company and is cancelling future orders from [employer]. Patient will contact me with any concerns  Al Corpus, PharmD notified  Burt Knack, [facility] Clinical Pharmacy Assistant [phone]

## 2022-06-12 ENCOUNTER — Other Ambulatory Visit: Payer: Self-pay | Admitting: Primary Care

## 2022-06-12 DIAGNOSIS — M5441 Lumbago with sciatica, right side: Secondary | ICD-10-CM

## 2022-06-25 ENCOUNTER — Telehealth: Payer: Self-pay | Admitting: Primary Care

## 2022-06-25 NOTE — Telephone Encounter (Signed)
Patient reports her [employer]estyle Libre reader is not charging and will not turn on. Discussed [employer] will replace a damaged reader at no cost. Patient will bring faulty reader to office so we can get serial number off the back in order to submit Reader Replacement form online.

## 2022-06-25 NOTE — Telephone Encounter (Signed)
Patient is requesting a phone call regarding her diabetic meter.

## 2022-06-25 NOTE — Telephone Encounter (Signed)
Patient returned call to the office, before I was able to connect with Laura Ferguson, patient hung up. Please retry patient

## 2022-06-25 NOTE — Telephone Encounter (Signed)
Returned call, no answer LVM to call back

## 2022-06-26 NOTE — Telephone Encounter (Signed)
Patient brought Laura Ferguson 3 reader to office. SN: [id number]  Submitted replacement request to [employer].

## 2022-06-29 DIAGNOSIS — H43813 Vitreous degeneration, bilateral: Secondary | ICD-10-CM | POA: Diagnosis not present

## 2022-06-29 DIAGNOSIS — H35373 Puckering of macula, bilateral: Secondary | ICD-10-CM | POA: Diagnosis not present

## 2022-06-29 DIAGNOSIS — E113313 Type 2 diabetes mellitus with moderate nonproliferative diabetic retinopathy with macular edema, bilateral: Secondary | ICD-10-CM | POA: Diagnosis not present

## 2022-06-29 DIAGNOSIS — H35033 Hypertensive retinopathy, bilateral: Secondary | ICD-10-CM | POA: Diagnosis not present

## 2022-06-29 DIAGNOSIS — H401132 Primary open-angle glaucoma, bilateral, moderate stage: Secondary | ICD-10-CM | POA: Diagnosis not present

## 2022-07-03 ENCOUNTER — Encounter: Payer: Self-pay | Admitting: Primary Care

## 2022-07-03 ENCOUNTER — Ambulatory Visit (INDEPENDENT_AMBULATORY_CARE_PROVIDER_SITE_OTHER): Payer: Medicare HMO | Admitting: Primary Care

## 2022-07-03 VITALS — BP 140/68 | HR 70 | Temp 97.2°F | Ht 63.0 in | Wt 122.0 lb

## 2022-07-03 DIAGNOSIS — M545 Low back pain, unspecified: Secondary | ICD-10-CM | POA: Diagnosis not present

## 2022-07-03 DIAGNOSIS — F419 Anxiety disorder, unspecified: Secondary | ICD-10-CM | POA: Diagnosis not present

## 2022-07-03 DIAGNOSIS — D259 Leiomyoma of uterus, unspecified: Secondary | ICD-10-CM

## 2022-07-03 DIAGNOSIS — E11319 Type 2 diabetes mellitus with unspecified diabetic retinopathy without macular edema: Secondary | ICD-10-CM | POA: Diagnosis not present

## 2022-07-03 DIAGNOSIS — Z794 Long term (current) use of insulin: Secondary | ICD-10-CM | POA: Diagnosis not present

## 2022-07-03 DIAGNOSIS — Z0000 Encounter for general adult medical examination without abnormal findings: Secondary | ICD-10-CM

## 2022-07-03 DIAGNOSIS — I2510 Atherosclerotic heart disease of native coronary artery without angina pectoris: Secondary | ICD-10-CM

## 2022-07-03 DIAGNOSIS — E785 Hyperlipidemia, unspecified: Secondary | ICD-10-CM

## 2022-07-03 DIAGNOSIS — I10 Essential (primary) hypertension: Secondary | ICD-10-CM | POA: Diagnosis not present

## 2022-07-03 DIAGNOSIS — G8929 Other chronic pain: Secondary | ICD-10-CM

## 2022-07-03 DIAGNOSIS — F32A Depression, unspecified: Secondary | ICD-10-CM

## 2022-07-03 DIAGNOSIS — K219 Gastro-esophageal reflux disease without esophagitis: Secondary | ICD-10-CM | POA: Diagnosis not present

## 2022-07-03 LAB — COMPREHENSIVE METABOLIC PANEL
ALT: 32 U/L (ref 0–35)
AST: 33 U/L (ref 0–37)
Albumin: 3.9 g/dL (ref 3.5–5.2)
Alkaline Phosphatase: 76 U/L (ref 39–117)
BUN: 26 mg/dL — ABNORMAL HIGH (ref 6–23)
CO2: 26 mEq/L (ref 19–32)
Calcium: 9.1 mg/dL (ref 8.4–10.5)
Chloride: 103 mEq/L (ref 96–112)
Creatinine, Ser: 1.56 mg/dL — ABNORMAL HIGH (ref 0.40–1.20)
GFR: 29.59 mL/min — ABNORMAL LOW (ref >60.00)
Glucose, Bld: 348 mg/dL — ABNORMAL HIGH (ref 70–99)
Potassium: 4.5 mEq/L (ref 3.5–5.1)
Sodium: 136 mEq/L (ref 135–145)
Total Bilirubin: 0.4 mg/dL (ref 0.2–1.2)
Total Protein: 6.9 g/dL (ref 6.0–8.3)

## 2022-07-03 LAB — HEMOGLOBIN A1C: Hgb A1c MFr Bld: 9.5 % — ABNORMAL HIGH (ref 4.6–6.5)

## 2022-07-03 LAB — MICROALBUMIN / CREATININE URINE RATIO
Creatinine,U: 62 mg/dL
Microalb Creat Ratio: 4.4 mg/g (ref 0.0–30.0)
Microalb, Ur: 2.8 mg/dL — ABNORMAL HIGH (ref 0.0–1.9)

## 2022-07-03 NOTE — Patient Instructions (Signed)
Stop by the lab prior to leaving today. I will notify you of your results once received.   It was a pleasure to see you today!  

## 2022-07-03 NOTE — Assessment & Plan Note (Signed)
Controlled with LDL of 39 on labs from January 2024.  Continue rosuvastatin 10 mg daily, Zetia 10 mg daily.

## 2022-07-03 NOTE — Assessment & Plan Note (Signed)
Asymptomatic.  Continue BP control, diabetes, lipid control.

## 2022-07-03 NOTE — Assessment & Plan Note (Signed)
Continued, especially with grief of the loss of her children and family members.  Offered therapy vs medication, she kindly declines. Continue to monitor.

## 2022-07-03 NOTE — Assessment & Plan Note (Signed)
Controlled today.  Continue cyclobenzaprine 5 mg PRN.

## 2022-07-03 NOTE — Progress Notes (Signed)
Subjective:    Patient ID: Laura Ferguson, female    DOB: 01/06/1933, 88 y.o.   MRN: [id number]  HPI  Laura Ferguson is a very pleasant 89 y.o. female who presents today for complete physical and follow up of chronic conditions.  Immunizations: -Shingles: Never completed -Pneumonia: Completed Prevnar 13 in 2021, Pneumovax 23 in 2023  Diet: Fair diet.  Exercise: No regular exercise.  Eye exam: Completes annually  Dental exam: Completes semi-annually    Mammogram: Completed years ago, declines given age Bone Density Scan: Declines   Colonoscopy: N/A given age  BP Readings from Last 3 Encounters:  07/03/22 (!) 140/68  04/25/22 138/86  03/28/22 118/64         Review of Systems  Constitutional:  Negative for unexpected weight change.  HENT:  Negative for rhinorrhea.   Respiratory:  Negative for cough and shortness of breath.   Cardiovascular:  Negative for chest pain.  Gastrointestinal:  Negative for constipation and diarrhea.  Genitourinary:  Negative for difficulty urinating.  Musculoskeletal:  Negative for arthralgias.  Skin:  Negative for rash.  Allergic/Immunologic: Negative for environmental allergies.  Neurological:  Negative for dizziness and headaches.  Psychiatric/Behavioral:  The patient is nervous/anxious.          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  Substance and Sexual Activity   Alcohol use: No    Alcohol/week: 0.0 standard drinks of alcohol   Drug use: No   Sexual activity: Not on file  Other Topics Concern   Not on file  Social History Narrative   Married.   Retired.   Works as a Engineer, structural.    Enjoys helping other.    Social Determinants of Health   Financial Resource Strain: Low Risk  (02/09/2022)   Overall Financial Resource Strain (CARDIA)    Difficulty of Paying Living Expenses: Not hard at all  Food Insecurity: No Food Insecurity (05/21/2022)   Hunger Vital Sign    Worried About Running Out of Food in the Last Year: Never true    Ran Out of Food in the Last Year: Never true  Transportation Needs: No Transportation Needs (05/24/2022)   PRAPARE - Administrator, Civil Service (Medical): No    Lack of Transportation (Non-Medical): No  Physical Activity: Inactive (02/08/2021)   Exercise Vital Sign    Days of Exercise per Week: 0 days    Minutes of Exercise per Session: 0 min  Stress: No Stress Concern Present (02/09/2022)   [employer] of Occupational Health - Occupational Stress Questionnaire    Feeling of Stress : Only a little  Social Connections: Moderately Isolated (02/09/2022)   Social Connection and Isolation Panel [NHANES]    Frequency of Communication with Friends and Family: More than three times a week    Frequency of Social Gatherings with Friends and Family: More than three times a week    Attends Religious Services: More than 4 times per year    Active Member of [employer] or Organizations: No    Attends Banker Meetings: Never    Marital Status: Widowed  Intimate Partner Violence: Not At Risk (02/09/2022)   Humiliation,  Afraid, Rape, and Kick questionnaire    Fear of Current or Ex-Partner: No    Emotionally Abused: No    Physically Abused: No    Sexually Abused: No    Past Surgical History:  Procedure Laterality Date   CARDIAC CATHETERIZATION  05/07/2007   EF 55%, focal mild hypocontractility in mid-distal inferior wall & mid posterolateral wall; severe multivessel CAD - susequent CABGx6 (Dr. Bishop Limbo)   CORONARY ARTERY BYPASS GRAFT  05/09/2007   LIMA to LAD, veing to intermediate; SVG to OM1 & OM2; SVG to acute marginal & distal RCA (Dr. Dorris Fetch)   TRANSTHORACIC ECHOCARDIOGRAM  02/13/2010   EF =>55%, vigorous contraction EF 65%; LA mild-mod dilated; IV normal diameter - normal CVP; trace MR; mild TR; trace AV regurg    Family History  Problem Relation Age of Onset   Cancer Mother    Diabetes Sister    Cancer Child     No Known Allergies  Current Outpatient Medications on File Prior to Visit  Medication Sig Dispense Refill   Acetaminophen (TYLENOL 8 HOUR PO) Take 1 tablet by mouth every 8 (eight) hours as needed.      aspirin 81 MG tablet Take 81 mg by mouth daily.     Blood Glucose Monitoring Suppl (ACCU-CHEK GUIDE) w/Device KIT Use as instructed to check blood sugar. 1 kit 0   Cholecalciferol (VITAMIN D) 50 MCG (2000 UT) CAPS Take 1 capsule by mouth daily.     Continuous Glucose Sensor (FREESTYLE LIBRE 3 SENSOR) MISC Apply sensor every 14 days to continuously monitor glucose 6 each 1   cyclobenzaprine (FLEXERIL) 5 MG tablet TAKE 1 TABLET BY MOUTH THREE TIMES DAILY AS NEEDED FOR MUSCLE SPASM MAY CAUSE DROWSINESS 30 tablet 0   ezetimibe (ZETIA) 10 MG tablet Take 1 tablet (10 mg total) by mouth daily. 90 tablet 3   fexofenadine (ALLEGRA) 180 MG tablet Take 180 mg by mouth daily.     glucose blood (ACCU-CHEK GUIDE) test strip USE TO CHECK SUGAR THREE TIMES DAILY AS DIRECTED 250 each 3   Insulin Glargine-Lixisenatide (SOLIQUA) 100-33 UNT-MCG/ML SOPN INJECT 22 UNITS SUBCUTANEOUSLY ONCE DAILY FOR  DIABETES 15 mL 1   Insulin Pen Needle (BD PEN NEEDLE NANO 2ND GEN) 32G X 4 MM MISC USE 1 PEN NEEDLE WITH INSULIN PEN  4 TIMES DAILY 100 each 0   losartan (COZAAR) 100 MG tablet Take 1 tablet (100 mg total) by mouth daily. 90 tablet 2   metoprolol succinate (TOPROL-XL) 50 MG 24 hr tablet TAKE 1 TABLET BY MOUTH IN THE MORNING AND 1/2 (ONE-HALF)  TAB IN THE EVENING 135 tablet 3   Multiple Vitamin (MULTIVITAMIN) capsule Take 1 capsule by mouth daily.     pantoprazole (PROTONIX) 40 MG tablet Take 1 tablet by mouth  once daily 90 tablet 3   Polyethyl Glycol-Propyl Glycol (SYSTANE OP) Apply 2 drops to eye 2 (two) times daily.      rosuvastatin (CRESTOR) 10 MG tablet Take 1 tablet (10 mg total) by mouth daily. for cholesterol. 90 tablet 3   TRAVATAN Z 0.004 % SOLN ophthalmic solution Place 1 drop into both eyes at bedtime.     Azelastine HCl 137 MCG/SPRAY SOLN As needed (Patient not taking: Reported on 07/03/2022)     No current facility-administered medications on file prior to visit.    BP (!) 140/68   Pulse 70   Temp (!) 97.2 F (36.2 C) (Temporal)   Ht 5\' 3"  (1.6 m)   Wt 122 lb (55.3 kg)   SpO2 99%   BMI 21.61 kg/m  Objective:   Physical Exam HENT:     Right Ear: Tympanic membrane and ear canal normal.     Left Ear: Tympanic membrane and ear canal normal.     Nose: Nose normal.  Eyes:     Conjunctiva/sclera: Conjunctivae normal.     Pupils: Pupils are equal, round, and reactive to light.  Neck:     Thyroid: No thyromegaly.  Cardiovascular:     Rate and Rhythm: Normal rate and regular rhythm.     Heart sounds: No murmur heard. Pulmonary:     Effort: Pulmonary effort is normal.     Breath sounds: Normal breath sounds. No rales.  Abdominal:     General: Bowel sounds are normal.     Palpations: Abdomen is soft.     Tenderness: There is no abdominal tenderness.  Musculoskeletal:        General: Normal range of motion.     Cervical back: Neck supple.  Lymphadenopathy:      Cervical: No cervical adenopathy.  Skin:    General: Skin is warm and dry.     Findings: No rash.  Neurological:     Mental Status: She is alert and oriented to person, place, and time.     Cranial Nerves: No cranial nerve deficit.     Deep Tendon Reflexes: Reflexes are normal and symmetric.  Psychiatric:        Mood and Affect: Mood normal.           Assessment & Plan:  Coronary artery disease involving native coronary artery of native heart without angina pectoris Assessment & Plan: Asymptomatic.  Continue BP control, diabetes, lipid control.     Primary hypertension Assessment & Plan: Overall controlled.  Continue losartan 100 mg daily, metoprolol succinate 50 mg daily. BMP pending.   Type 2 diabetes mellitus with retinopathy, with long-term current use of insulin, macular edema presence unspecified, unspecified laterality, unspecified retinopathy severity (HCC) Assessment & Plan: Repeat A1C pending.  Glucose readings seem all over the place, she is a poor historian.   Continue Soliqua 100-33 units/mcg 24 units daily.  Follow up in 3-6 months depending on A1C result.    Anxiety and depression Assessment & Plan: Continued, especially with grief of the loss of her children and family members.  Offered therapy vs medication, she kindly declines. Continue to monitor.    Chronic bilateral low back pain without sciatica Assessment & Plan: Controlled today.  Continue cyclobenzaprine 5 mg PRN.   Hyperlipidemia, unspecified hyperlipidemia type Assessment & Plan: Controlled with LDL of 39 on labs from January 2024.  Continue rosuvastatin 10 mg daily, Zetia 10 mg daily.   Uterine leiomyoma, unspecified location Assessment & Plan: Reviewed pelvic/transvaginal US  with patient today. GYN consulted at the time who agrees that this is not of concern.  No alarm signs in HPI or on exam. Continue to monitor.    Preventative health care Assessment &  Plan: Pneumonia vaccines UTD Mammogram declined given age. Bone density scan declined.  Colonoscopy N/A given age  Discussed the importance of a healthy diet and regular exercise in order for weight loss, and to reduce the risk of further co-morbidity.  Exam stable. Labs pending.  Follow up in 1 year for repeat physical.          Doreene Nest, NP

## 2022-07-03 NOTE — Assessment & Plan Note (Signed)
Reviewed pelvic/transvaginal US with patient today. GYN consulted at the time who agrees that this is not of concern.  No alarm signs in HPI or on exam. Continue to monitor.

## 2022-07-03 NOTE — Assessment & Plan Note (Signed)
Repeat A1C pending.  Glucose readings seem all over the place, she is a poor historian.   Continue Soliqua 100-33 units/mcg 24 units daily.  Follow up in 3-6 months depending on A1C result.

## 2022-07-03 NOTE — Assessment & Plan Note (Signed)
Pneumonia vaccines UTD Mammogram declined given age. Bone density scan declined.  Colonoscopy N/A given age  Discussed the importance of a healthy diet and regular exercise in order for weight loss, and to reduce the risk of further co-morbidity.  Exam stable. Labs pending.  Follow up in 1 year for repeat physical.

## 2022-07-03 NOTE — Assessment & Plan Note (Signed)
Controlled. ° °Continue pantoprazole 40 mg daily. °

## 2022-07-03 NOTE — Addendum Note (Signed)
Addended by: Eual Fines on: 07/03/2022 11:39 AM   Modules accepted: Orders

## 2022-07-03 NOTE — Assessment & Plan Note (Signed)
Overall controlled.  Continue losartan 100 mg daily, metoprolol succinate 50 mg daily. BMP pending.

## 2022-07-04 ENCOUNTER — Other Ambulatory Visit: Payer: Self-pay | Admitting: Primary Care

## 2022-07-04 DIAGNOSIS — I10 Essential (primary) hypertension: Secondary | ICD-10-CM

## 2022-07-10 ENCOUNTER — Other Ambulatory Visit: Payer: Self-pay | Admitting: Cardiovascular Disease

## 2022-07-17 DIAGNOSIS — H903 Sensorineural hearing loss, bilateral: Secondary | ICD-10-CM | POA: Diagnosis not present

## 2022-07-18 ENCOUNTER — Other Ambulatory Visit: Payer: Medicare HMO

## 2022-07-30 ENCOUNTER — Other Ambulatory Visit: Payer: Self-pay | Admitting: Primary Care

## 2022-07-30 DIAGNOSIS — E11319 Type 2 diabetes mellitus with unspecified diabetic retinopathy without macular edema: Secondary | ICD-10-CM

## 2022-07-30 NOTE — Telephone Encounter (Signed)
Patient called in to make sure we received refill request. Informed patient we did and the timeframe. Thank you!

## 2022-08-06 DIAGNOSIS — H903 Sensorineural hearing loss, bilateral: Secondary | ICD-10-CM | POA: Diagnosis not present

## 2022-09-04 ENCOUNTER — Telehealth: Payer: Self-pay | Admitting: Primary Care

## 2022-09-04 NOTE — Telephone Encounter (Signed)
Patient called in and would like a phone call back in regards to her appointment on September 19. Thank you!

## 2022-09-04 NOTE — Telephone Encounter (Signed)
Left message to return call to our office.  

## 2022-09-05 NOTE — Telephone Encounter (Signed)
Left message to return call to our office.  

## 2022-09-06 DIAGNOSIS — Z961 Presence of intraocular lens: Secondary | ICD-10-CM | POA: Diagnosis not present

## 2022-09-06 DIAGNOSIS — H26493 Other secondary cataract, bilateral: Secondary | ICD-10-CM | POA: Diagnosis not present

## 2022-09-06 DIAGNOSIS — H401130 Primary open-angle glaucoma, bilateral, stage unspecified: Secondary | ICD-10-CM | POA: Diagnosis not present

## 2022-09-06 DIAGNOSIS — E113313 Type 2 diabetes mellitus with moderate nonproliferative diabetic retinopathy with macular edema, bilateral: Secondary | ICD-10-CM | POA: Diagnosis not present

## 2022-09-10 NOTE — Telephone Encounter (Signed)
Called and spoke with patient. Answered all questions. Appt has been moved to 09/21/22.

## 2022-09-21 ENCOUNTER — Ambulatory Visit
Admission: RE | Admit: 2022-09-21 | Discharge: 2022-09-21 | Disposition: A | Discharge status: Home / Self Care | Payer: Medicare HMO | Source: Ambulatory Visit | Attending: Primary Care | Admitting: Primary Care

## 2022-09-21 ENCOUNTER — Ambulatory Visit: Payer: Medicare HMO | Admitting: Primary Care

## 2022-09-21 ENCOUNTER — Encounter: Payer: Self-pay | Admitting: Primary Care

## 2022-09-21 ENCOUNTER — Other Ambulatory Visit: Payer: Self-pay | Admitting: Primary Care

## 2022-09-21 ENCOUNTER — Ambulatory Visit (INDEPENDENT_AMBULATORY_CARE_PROVIDER_SITE_OTHER): Payer: Medicare HMO | Admitting: Primary Care

## 2022-09-21 VITALS — BP 168/82 | HR 63 | Temp 97.3°F | Ht 63.0 in | Wt 122.0 lb

## 2022-09-21 DIAGNOSIS — E11319 Type 2 diabetes mellitus with unspecified diabetic retinopathy without macular edema: Secondary | ICD-10-CM

## 2022-09-21 DIAGNOSIS — D259 Leiomyoma of uterus, unspecified: Secondary | ICD-10-CM | POA: Diagnosis not present

## 2022-09-21 DIAGNOSIS — R1031 Right lower quadrant pain: Secondary | ICD-10-CM | POA: Insufficient documentation

## 2022-09-21 DIAGNOSIS — Z794 Long term (current) use of insulin: Secondary | ICD-10-CM | POA: Diagnosis not present

## 2022-09-21 DIAGNOSIS — K5730 Diverticulosis of large intestine without perforation or abscess without bleeding: Secondary | ICD-10-CM | POA: Diagnosis not present

## 2022-09-21 DIAGNOSIS — R102 Pelvic and perineal pain: Secondary | ICD-10-CM | POA: Diagnosis not present

## 2022-09-21 LAB — POCT GLYCOSYLATED HEMOGLOBIN (HGB A1C): Hemoglobin A1C: 7.9 % — AB (ref 4.0–5.6)

## 2022-09-21 LAB — POCT I-STAT CREATININE: Creatinine, Ser: 1.6 mg/dL — ABNORMAL HIGH (ref 0.44–1.00)

## 2022-09-21 NOTE — Assessment & Plan Note (Signed)
To right pelvic region as well.  Exam today with large mass to right groin/mons pubis that is likely the identified uterine fiborid. Need to assess to see if anything else has developed.  CBC and CMP pending. Stat CT abdomen/pelvis ordered and pending  Hospital precautions provided.

## 2022-09-21 NOTE — Progress Notes (Signed)
Subjective:    Patient ID: Laura Ferguson, female    DOB: 1932/02/04, 89 y.o.   MRN: [id number]  HPI  Laura Ferguson is a very pleasant 89 y.o. female with a history of type 2 diabetes, CAD, hypertension, depression, uterine fibroid hyperlipidemia, who presents today for follow-up of diabetes and to discuss pelvic pain.  1) Type 2 Diabetes:  Current medications include: Soliqua 22 units daily. She is actually injecting 28 units daily.   She is checking her blood glucose continuously and is getting readings:  She did not bring her meter with her today. Her blood sugars "are all over the place". She doesn't recall them well.   AM fasting: 60s-90s  Afternoon: high 100s, mid 200's Bedtime: 200s 1 am: 60s-80  Last A1C: 9.5 in June 2024, 7.9 today Last Eye Exam: Annually Last Foot Exam: Due Pneumonia Vaccination: 2023 Urine Microalbumin:  Statin: Rosuvastatin  Dietary changes since last visit: Fruit, sandwiches, chicken wings, potato salad, oatmeal, cereal, vegetables    Exercise: Active  2) Abdominal/Pelvic Pain: Her pain is located to the right pelvic and groin region which began about 1 year ago. Her pain is intermittent, occurs 1-2 times weekly. She has bowel movement once daily. She does notice improvement in her pain with passing gas.   She underwent pelvic/transvaginal ultrasound at the time on 08/16/21 which showed partially calcified uterine fibroid and a large exophytic uterine fibroid to the right adnexa.   She presented to an Urgent Care (doesn't recall the name) two weeks ago for her right lower quadrant pain with nausea, vomiting, and diarrhea.   BP Readings from Last 3 Encounters:  09/21/22 (!) 168/82  07/03/22 (!) 140/68  04/25/22 138/86   Wt Readings from Last 3 Encounters:  09/21/22 122 lb (55.3 kg)  07/03/22 122 lb (55.3 kg)  04/25/22 124 lb (56.2 kg)      Review of Systems  Constitutional:  Negative for fever.  Gastrointestinal:  Positive for  abdominal pain and nausea.  Genitourinary:  Positive for pelvic pain. Negative for dysuria, hematuria and vaginal discharge.  Neurological:  Negative for dizziness.         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  Substance and Sexual Activity   Alcohol use: No    Alcohol/week: 0.0 standard drinks of alcohol   Drug use: No   Sexual activity: Not on file  Other Topics Concern   Not on file  Social History  Narrative   Married.   Retired.   Works as a Engineer, structural.    Enjoys helping other.    Social Determinants of Health   Financial Resource Strain: Low Risk  (02/09/2022)   Overall Financial Resource Strain (CARDIA)    Difficulty of Paying Living Expenses: Not hard at all  Food Insecurity: No Food Insecurity (05/21/2022)   Hunger Vital Sign    Worried About Running Out of Food in the Last Year: Never true    Ran Out of Food in the Last Year: Never true  Transportation Needs: No Transportation Needs (05/24/2022)   PRAPARE - Administrator, Civil Service (Medical): No    Lack of Transportation (Non-Medical): No  Physical  Activity: Inactive (02/08/2021)   Exercise Vital Sign    Days of Exercise per Week: 0 days    Minutes of Exercise per Session: 0 min  Stress: No Stress Concern Present (02/09/2022)   [employer] of Occupational Health - Occupational Stress Questionnaire    Feeling of Stress : Only a little  Social Connections: Moderately Isolated (02/09/2022)   Social Connection and Isolation Panel [NHANES]    Frequency of Communication with Friends and Family: More than three times a week    Frequency of Social Gatherings with Friends and Family: More than three times a week    Attends Religious Services: More than 4 times per year    Active Member of [employer] or Organizations: No    Attends Banker Meetings: Never    Marital Status: Widowed  Intimate Partner Violence: Not At Risk (02/09/2022)   Humiliation, Afraid, Rape, and Kick questionnaire    Fear of Current or Ex-Partner: No    Emotionally Abused: No    Physically Abused: No    Sexually Abused: No    Past Surgical History:  Procedure Laterality Date   CARDIAC CATHETERIZATION  05/07/2007   EF 55%, focal mild hypocontractility in mid-distal inferior wall & mid posterolateral wall; severe multivessel CAD - susequent CABGx6 (Dr. Bishop Limbo)   CORONARY ARTERY BYPASS GRAFT  05/09/2007   LIMA to LAD, veing to intermediate; SVG to OM1 & OM2; SVG to acute marginal & distal RCA (Dr. Dorris Fetch)   TRANSTHORACIC ECHOCARDIOGRAM  02/13/2010   EF =>55%, vigorous contraction EF 65%; LA mild-mod dilated; IV normal diameter - normal CVP; trace MR; mild TR; trace AV regurg    Family History  Problem Relation Age of Onset   Cancer Mother    Diabetes Sister    Cancer Child     No Known Allergies  Current Outpatient Medications on File Prior to Visit  Medication Sig Dispense Refill   Acetaminophen (TYLENOL 8 HOUR PO) Take 1 tablet by mouth every 8 (eight) hours as needed.      aspirin 81 MG tablet Take 81 mg by mouth daily.     Azelastine HCl  137 MCG/SPRAY SOLN As needed     Blood Glucose Monitoring Suppl (ACCU-CHEK GUIDE) w/Device KIT Use as instructed to check blood sugar. 1 kit 0   Cholecalciferol (VITAMIN D) 50 MCG (2000 UT) CAPS Take 1 capsule by mouth daily.     Continuous Glucose Sensor (FREESTYLE LIBRE 3 SENSOR) MISC Apply sensor every 14 days to continuously monitor glucose 6 each 1   cyclobenzaprine (FLEXERIL) 5 MG tablet TAKE 1 TABLET BY MOUTH THREE TIMES DAILY AS NEEDED FOR MUSCLE SPASM MAY CAUSE DROWSINESS 30 tablet 0   ezetimibe (ZETIA) 10 MG tablet Take 1 tablet by mouth once  daily 90 tablet 0   fexofenadine (ALLEGRA) 180 MG tablet Take 180 mg by mouth daily.     glucose blood (ACCU-CHEK GUIDE) test strip USE TO CHECK SUGAR THREE TIMES DAILY AS DIRECTED 250 each 3   Insulin Pen Needle (BD PEN NEEDLE NANO 2ND GEN) 32G X 4 MM MISC USE 1 PEN NEEDLE WITH INSULIN PEN  4 TIMES DAILY 100 each 0   losartan (COZAAR) 100 MG tablet Take 1 tablet (100 mg total) by mouth daily. 90 tablet 2   metoprolol succinate (TOPROL-XL) 50 MG 24 hr tablet TAKE 1 TABLET BY MOUTH IN THE MORNING AND 1/2 (ONE-HALF)  TAB IN THE EVENING 135 tablet 3   Multiple Vitamin (MULTIVITAMIN) capsule Take 1 capsule by mouth daily.     pantoprazole (PROTONIX) 40 MG tablet Take 1 tablet by mouth once daily 90 tablet 3   Polyethyl Glycol-Propyl Glycol (SYSTANE OP) Apply 2 drops to eye 2 (two) times daily.      rosuvastatin (CRESTOR) 10 MG tablet Take 1 tablet (10 mg total) by mouth daily. for cholesterol. 90 tablet 3   SOLIQUA 100-33 UNT-MCG/ML SOPN INJECT 22 UNITS SUBCUTANEOUSLY ONCE DAILY FOR DIABETES 15 mL 0   TRAVATAN Z 0.004 % SOLN ophthalmic solution Place 1 drop into both eyes at bedtime.     No current facility-administered medications on file prior to visit.    BP (!) 168/82   Pulse 63   Temp (!) 97.3 F (36.3 C) (Temporal)   Ht 5\' 3"  (1.6 m)   Wt 122 lb (55.3 kg)   SpO2 94%   BMI 21.61 kg/m  Objective:   Physical Exam Constitutional:       General: She is not in acute distress. Cardiovascular:     Rate and Rhythm: Normal rate and regular rhythm.  Pulmonary:     Effort: Pulmonary effort is normal.     Breath sounds: Normal breath sounds.  Abdominal:     General: Bowel sounds are normal.     Palpations: Abdomen is soft.     Tenderness: There is no abdominal tenderness. There is no right CVA tenderness or left CVA tenderness.       Comments: Large mass to right groin/mons pubis without tenderness   Musculoskeletal:     Cervical back: Neck supple.  Skin:    General: Skin is warm and dry.  Neurological:     Mental Status: She is alert.           Assessment & Plan:  Type 2 diabetes mellitus with retinopathy, with long-term current use of insulin, macular edema presence unspecified, unspecified laterality, unspecified retinopathy severity (HCC) Assessment & Plan: Improved with A1C of 7.9 today.  Continue Soliqua 22-28 units daily.  Foot exam today.  Follow up in 3 months  Orders: -     POCT glycosylated hemoglobin (Hb A1C)  Right lower quadrant abdominal pain Assessment & Plan: To right pelvic region as well.  Exam today with large mass to right groin/mons pubis that is likely the identified uterine fiborid. Need to assess to see if anything else has developed.  CBC and CMP pending. Stat CT abdomen/pelvis ordered and pending  Hospital precautions provided.   Orders: -     CT ABDOMEN PELVIS W CONTRAST; Future -     CBC with Differential/Platelet; Future -     Comprehensive metabolic panel; Future  Pelvic pain Assessment & Plan: Could be large adnexal fibroid that has been identified on previous ultrasound. Need to learn  if new developments have occurred.  Stat CT abdomen/pelvis ordered and pending.         Doreene Nest, NP

## 2022-09-21 NOTE — Patient Instructions (Signed)
We will call you regarding the CT scan.  We will call you with results of your blood work.  It was a pleasure to see you today!

## 2022-09-21 NOTE — Assessment & Plan Note (Signed)
Improved with A1C of 7.9 today.  Continue Soliqua 22-28 units daily.  Foot exam today.  Follow up in 3 months

## 2022-09-21 NOTE — Assessment & Plan Note (Signed)
Could be large adnexal fibroid that has been identified on previous ultrasound. Need to learn if new developments have occurred.  Stat CT abdomen/pelvis ordered and pending.

## 2022-09-22 LAB — CBC WITH DIFFERENTIAL/PLATELET
Absolute Monocytes: 492 {cells}/uL (ref 200–950)
Basophils Absolute: 32 {cells}/uL (ref 0–200)
Basophils Relative: 0.7 %
Eosinophils Absolute: 110 {cells}/uL (ref 15–500)
Eosinophils Relative: 2.4 %
HCT: 37.3 % (ref 35.0–45.0)
Hemoglobin: 12.5 g/dL (ref 11.7–15.5)
Lymphs Abs: 2088 {cells}/uL (ref 850–3900)
MCH: 31.3 pg (ref 27.0–33.0)
MCHC: 33.5 g/dL (ref 32.0–36.0)
MCV: 93.3 fL (ref 80.0–100.0)
MPV: 10.7 fL (ref 7.5–12.5)
Monocytes Relative: 10.7 %
Neutro Abs: 1877 {cells}/uL (ref 1500–7800)
Neutrophils Relative %: 40.8 %
Platelets: 158 10*3/uL (ref 140–400)
RBC: 4 10*6/uL (ref 3.80–5.10)
RDW: 12.9 % (ref 11.0–15.0)
Total Lymphocyte: 45.4 %
WBC: 4.6 10*3/uL (ref 3.8–10.8)

## 2022-09-22 LAB — COMPREHENSIVE METABOLIC PANEL
AG Ratio: 1.4 (calc) (ref 1.0–2.5)
ALT: 38 U/L — ABNORMAL HIGH (ref 6–29)
AST: 29 U/L (ref 10–35)
Albumin: 4.4 g/dL (ref 3.6–5.1)
Alkaline phosphatase (APISO): 77 U/L (ref 37–153)
BUN/Creatinine Ratio: 15 (calc) (ref 6–22)
BUN: 23 mg/dL (ref 7–25)
CO2: 21 mmol/L (ref 20–32)
Calcium: 10.2 mg/dL (ref 8.6–10.4)
Chloride: 113 mmol/L — ABNORMAL HIGH (ref 98–110)
Creat: 1.52 mg/dL — ABNORMAL HIGH (ref 0.60–0.95)
Globulin: 3.2 g/dL (calc) (ref 1.9–3.7)
Glucose, Bld: 175 mg/dL — ABNORMAL HIGH (ref 65–99)
Potassium: 4.6 mmol/L (ref 3.5–5.3)
Sodium: 146 mmol/L (ref 135–146)
Total Bilirubin: 0.6 mg/dL (ref 0.2–1.2)
Total Protein: 7.6 g/dL (ref 6.1–8.1)

## 2022-09-24 ENCOUNTER — Other Ambulatory Visit: Payer: Self-pay | Admitting: Primary Care

## 2022-09-24 ENCOUNTER — Telehealth: Payer: Self-pay | Admitting: Primary Care

## 2022-09-24 DIAGNOSIS — D259 Leiomyoma of uterus, unspecified: Secondary | ICD-10-CM

## 2022-09-24 NOTE — Telephone Encounter (Signed)
Called and spoke with Laura Ferguson, patient daughter ([place]). She stated she has been getting more and more concerned with the patient and her health as of recently. She says the patient is adamant that she doesn't want her coming to appts with her, and she's constantly getting upset thinking her daughter is in her business. Her daughter is trying to express concern and care.  Pt daughter notes she gets anxiety attacks over blood sugar dropping, is wanting to go to the hospital more frequently and has noticed more anger issues, wondering if she could be getting dementia. Laura Ferguson would like to speak with Jae Dire in regards to her concerns and what can be done about this.

## 2022-09-24 NOTE — Telephone Encounter (Signed)
Patient daughter Rene Kocher (on [place]) called in and stated that she wanted to discuss some concerning issues about Mrs. Dudash. She would like to keep this conversation confidential from Mrs. Caffey. She can be reached at [phone]. Thank you!

## 2022-09-25 NOTE — Telephone Encounter (Signed)
Spoke with patient's daughter Rene Kocher about concerns.  Recommended a visit with patient and family to discuss further. They will schedule.

## 2022-09-28 ENCOUNTER — Telehealth: Payer: Self-pay | Admitting: Primary Care

## 2022-09-28 DIAGNOSIS — H35373 Puckering of macula, bilateral: Secondary | ICD-10-CM | POA: Diagnosis not present

## 2022-09-28 DIAGNOSIS — E113311 Type 2 diabetes mellitus with moderate nonproliferative diabetic retinopathy with macular edema, right eye: Secondary | ICD-10-CM | POA: Diagnosis not present

## 2022-09-28 DIAGNOSIS — H35033 Hypertensive retinopathy, bilateral: Secondary | ICD-10-CM | POA: Diagnosis not present

## 2022-09-28 DIAGNOSIS — H401132 Primary open-angle glaucoma, bilateral, moderate stage: Secondary | ICD-10-CM | POA: Diagnosis not present

## 2022-09-28 DIAGNOSIS — H43813 Vitreous degeneration, bilateral: Secondary | ICD-10-CM | POA: Diagnosis not present

## 2022-09-28 DIAGNOSIS — E113313 Type 2 diabetes mellitus with moderate nonproliferative diabetic retinopathy with macular edema, bilateral: Secondary | ICD-10-CM | POA: Diagnosis not present

## 2022-09-28 NOTE — Telephone Encounter (Signed)
Patient called in and would like a call in regards to an appointment that Laura Ferguson was suppose to be scheduling for her. Thank you!

## 2022-09-28 NOTE — Telephone Encounter (Signed)
Called and advised patient referral has been placed for GYN, and referrals team will contact her within a week to be scheduled. She will reach out if she doesn't hear form someone in a week.

## 2022-10-04 ENCOUNTER — Ambulatory Visit: Payer: Medicare HMO | Admitting: Primary Care

## 2022-10-05 ENCOUNTER — Telehealth: Payer: Self-pay | Admitting: Primary Care

## 2022-10-05 DIAGNOSIS — E11319 Type 2 diabetes mellitus with unspecified diabetic retinopathy without macular edema: Secondary | ICD-10-CM

## 2022-10-05 MED ORDER — BD PEN NEEDLE NANO 2ND GEN 32G X 4 MM MISC: 3 refills | Status: DC

## 2022-10-05 NOTE — Telephone Encounter (Signed)
Prescription Request  10/05/2022  LOV: 09/21/2022  What is the name of the medication or equipment? Insulin Pen Needle (BD PEN NEEDLE NANO 2ND GEN) 32G X 4 MM MISC   Have you contacted your pharmacy to request a refill? Yes   Which pharmacy would you like this sent to?  [facility] [address] [place] - [address] [place] [address] Phone: [phone] Fax: [phone]    Patient notified that their request is being sent to the clinical staff for review and that they should receive a response within 2 business days.   Please advise at [facility] [phone]

## 2022-10-05 NOTE — Addendum Note (Signed)
Addended by: Doreene Nest on: 10/05/2022 05:58 PM   Modules accepted: Orders

## 2022-10-07 ENCOUNTER — Other Ambulatory Visit: Payer: Self-pay | Admitting: Primary Care

## 2022-10-07 DIAGNOSIS — E11319 Type 2 diabetes mellitus with unspecified diabetic retinopathy without macular edema: Secondary | ICD-10-CM

## 2022-10-08 ENCOUNTER — Telehealth: Payer: Self-pay | Admitting: Primary Care

## 2022-10-08 NOTE — Telephone Encounter (Signed)
I sent a refill of this medication on 10/07/22. Has she called the pharmacy?

## 2022-10-08 NOTE — Telephone Encounter (Signed)
Prescription Request  10/08/2022  LOV: 09/21/2022  What is the name of the medication or equipment? Insulin Glargine-Lixisenatide (SOLIQUA) 100-33 UNT-MCG/ML SOPN   Have you contacted your pharmacy to request a refill? Yes   Which pharmacy would you like this sent to?  [facility] [address] [place] - [address] [place] [address] Phone: [phone] Fax: [phone]    Patient notified that their request is being sent to the clinical staff for review and that they should receive a response within 2 business days.   Please advise at Mobile [phone] (mobile)

## 2022-10-09 DIAGNOSIS — H43813 Vitreous degeneration, bilateral: Secondary | ICD-10-CM | POA: Diagnosis not present

## 2022-10-09 DIAGNOSIS — E113312 Type 2 diabetes mellitus with moderate nonproliferative diabetic retinopathy with macular edema, left eye: Secondary | ICD-10-CM | POA: Diagnosis not present

## 2022-10-09 DIAGNOSIS — H401132 Primary open-angle glaucoma, bilateral, moderate stage: Secondary | ICD-10-CM | POA: Diagnosis not present

## 2022-10-09 DIAGNOSIS — H35033 Hypertensive retinopathy, bilateral: Secondary | ICD-10-CM | POA: Diagnosis not present

## 2022-10-09 DIAGNOSIS — H35373 Puckering of macula, bilateral: Secondary | ICD-10-CM | POA: Diagnosis not present

## 2022-10-09 NOTE — Telephone Encounter (Signed)
Called and left message for patient per dpr.

## 2022-10-11 ENCOUNTER — Telehealth: Payer: Self-pay | Admitting: Primary Care

## 2022-10-11 DIAGNOSIS — D259 Leiomyoma of uterus, unspecified: Secondary | ICD-10-CM

## 2022-10-11 DIAGNOSIS — R102 Pelvic and perineal pain: Secondary | ICD-10-CM

## 2022-10-11 NOTE — Telephone Encounter (Signed)
Laura Ferguson front office mgr asked me to speak with pt and Laura Ferguson who are both on phone; pt gave permission to speak with Laura Ferguson. Laura Ferguson said that Laura Ferguson is not taking pts (Laura) and is sending it back to PCP. Laura Ferguson said pt is still hurting and pt said now more of a soreness in lower rt abd due to hurting for so long. Pt and Laura Ferguson want Laura referral ASAP and will go anywhere. Pt is interested in Laura Ferguson at Laura Ferguson. But will go wherever pt can be seen quickest. Laura Ferguson said to send note to her and she will put in as urgent. Pt appreciative and wants cb to pt at [phone] and Laura Ferguson AT [phone].when Laura appt is scheduled. Pt wants both # called. Pt also wants verification there is an approval from ins co that pt can go to Laura. UC &ED precautions given and pt voiced understanding. Sending note to Laura Gitelman NP.

## 2022-10-11 NOTE — Telephone Encounter (Signed)
FYI: Patient's daughter Octava Pranke (not on [place]) called to find out about a CT scan that her mom is supposed to have. Judy Pimple that she was not on the DPR, rather her sister Rene Kocher is.  Rosey Bath said she would have her sister call back

## 2022-10-11 NOTE — Telephone Encounter (Signed)
Urgent referral placed.  Please notify referrals team.

## 2022-10-12 NOTE — Telephone Encounter (Signed)
Message received from the referrals team stating that the GYN office here in [place] does not have new appointments available.  Please call the patient or her daughter and ask if they are willing to go to [place] or another clinic in [place].

## 2022-10-12 NOTE — Telephone Encounter (Signed)
Spoke with Laura Ferguson in referrals, she advised most GYN offices are closed at noon on Friday and this referral will have to be addressed first thing on Monday morning. She also advised that most GYN new patient appts are booked out a month or so. FYI to PCP

## 2022-10-12 NOTE — Telephone Encounter (Signed)
Per message with Artelia Laroche, patient is okay going to any location that can get her in quickest.

## 2022-10-12 NOTE — Telephone Encounter (Signed)
STAT team has been notified.

## 2022-10-12 NOTE — Telephone Encounter (Signed)
Noted. Will await response on Monday.

## 2022-10-12 NOTE — Telephone Encounter (Signed)
Excellent!!! Much appreciated!

## 2022-10-12 NOTE — Telephone Encounter (Signed)
New referral placed.  Please notify stat referrals team

## 2022-10-12 NOTE — Telephone Encounter (Signed)
Referrals team notified.

## 2022-10-14 ENCOUNTER — Other Ambulatory Visit: Payer: Self-pay | Admitting: Primary Care

## 2022-10-14 ENCOUNTER — Other Ambulatory Visit: Payer: Self-pay | Admitting: Cardiovascular Disease

## 2022-10-14 DIAGNOSIS — E11319 Type 2 diabetes mellitus with unspecified diabetic retinopathy without macular edema: Secondary | ICD-10-CM

## 2022-10-15 ENCOUNTER — Telehealth: Payer: Self-pay | Admitting: Primary Care

## 2022-10-15 ENCOUNTER — Telehealth: Payer: Self-pay

## 2022-10-15 DIAGNOSIS — E11319 Type 2 diabetes mellitus with unspecified diabetic retinopathy without macular edema: Secondary | ICD-10-CM

## 2022-10-15 NOTE — Telephone Encounter (Signed)
See other phone encounter.  

## 2022-10-15 NOTE — Telephone Encounter (Signed)
Patient called in and stated that her Continuous Glucose Sensor (FREESTYLE LIBRE 3 SENSOR) MISC is running out and the pharmacy no longer carries that brand. She stated that if you need more information to call the pharmacy and they can inform on what needs to be sent in.

## 2022-10-15 NOTE — Telephone Encounter (Signed)
I sent plenty of refills for this on 10/14/22. Let daughter know.

## 2022-10-15 NOTE — Telephone Encounter (Signed)
Called and spoke with patients daughter, Rene Kocher. She states that over the weekend the patient CGM was malfunctioning. It kept saying there was an error with reading. Rene Kocher says the patient gets very upset and agitated when it is not reading correctly. They are requesting a replacement be sent in to the pharmacy.

## 2022-10-15 NOTE — Telephone Encounter (Signed)
Patient daughter Laura Ferguson called in and had some questions regarding her moms meter. She stated that they have had an issue with it all weekend, She can be reached at [phone]. Thank you!

## 2022-10-15 NOTE — Telephone Encounter (Signed)
Notified pt daughter of appt day and time for urgent referral.

## 2022-10-16 ENCOUNTER — Encounter: Payer: Self-pay | Admitting: Obstetrics and Gynecology

## 2022-10-16 ENCOUNTER — Other Ambulatory Visit (HOSPITAL_COMMUNITY)
Admission: RE | Admit: 2022-10-16 | Discharge: 2022-10-16 | Disposition: A | Discharge status: Home / Self Care | Payer: Medicare HMO | Source: Ambulatory Visit | Attending: Obstetrics and Gynecology | Admitting: Obstetrics and Gynecology

## 2022-10-16 ENCOUNTER — Ambulatory Visit: Payer: Medicare HMO | Admitting: Obstetrics and Gynecology

## 2022-10-16 VITALS — BP 136/73 | HR 68 | Wt 122.0 lb

## 2022-10-16 DIAGNOSIS — Z1151 Encounter for screening for human papillomavirus (HPV): Secondary | ICD-10-CM | POA: Insufficient documentation

## 2022-10-16 DIAGNOSIS — Z1273 Encounter for screening for malignant neoplasm of ovary: Secondary | ICD-10-CM | POA: Diagnosis not present

## 2022-10-16 DIAGNOSIS — R102 Pelvic and perineal pain: Secondary | ICD-10-CM | POA: Diagnosis not present

## 2022-10-16 DIAGNOSIS — N9489 Other specified conditions associated with female genital organs and menstrual cycle: Secondary | ICD-10-CM | POA: Diagnosis not present

## 2022-10-16 DIAGNOSIS — Z01411 Encounter for gynecological examination (general) (routine) with abnormal findings: Secondary | ICD-10-CM | POA: Insufficient documentation

## 2022-10-16 DIAGNOSIS — D259 Leiomyoma of uterus, unspecified: Secondary | ICD-10-CM

## 2022-10-16 MED ORDER — FREESTYLE LIBRE 2 READER DEVI
0 refills | Status: DC
Start: 2022-10-16 — End: 2023-03-18

## 2022-10-16 MED ORDER — FREESTYLE LIBRE 2 SENSOR MISC
1 refills | Status: DC
Start: 2022-10-16 — End: 2023-03-18

## 2022-10-16 NOTE — Progress Notes (Signed)
Obstetrics and Gynecology New Patient Evaluation  Appointment Date: 10/16/2022  OBGYN Clinic: [facility]  Primary Care Provider: Doreene Nest  Referring Provider: Doreene Nest, NP  Chief Complaint: abdomino-pelvic pain, fibroids   History of Present Illness: Laura Ferguson is a 89 y.o. African-American P8 (LMP: 30s), seen for the above chief complaint. Her past medical history is significant for DM2, h/o CABG, HTN  Patient was having low back pain last year and got an x-ray by her PCP in may that showed a fibroid and she had an August 2023 pelvic u/s by her PCP which confirmed the fibroids; u/s showed 8 x 5 x 5cm uterus with a likley 5cm fundal fibroid and a right adnexal 7cm pendunculated fibroid; endometrial stripe could not be evaluated.   Since the patient was not having any GYN s/s, PCP recommended expectant management.   In early September of this year, patient having abdominal pain and seen by PCP who ordered a CT which showed the fibroids again and pt referred to GYN.  Patient states that she thinks it may be gas pain but it was intense enough when she felt it to see her PCP. No current s/s and pt denies any h/o postmenopausal bleeding/spotting, discharge or any blood in her BMs; she states she has never had a colonoscopy and that she never had an abnormal pap smear in the past.   Review of Systems: Pertinent items noted in HPI and remainder of comprehensive ROS otherwise negative.    Patient Active Problem List   Diagnosis Date Noted   Right lower quadrant abdominal pain 09/21/2022   Pelvic pain 09/21/2022   Uterine fibroid 07/03/2022   GERD (gastroesophageal reflux disease) 07/03/2022   Myalgia 08/04/2020   Fatigue 03/02/2020   Preventative health care 10/13/2019   Other social stressor 08/31/2019   Type 2 diabetes mellitus with retinopathy (HCC) 01/29/2018   Chronic back pain 11/12/2017   Anxiety and depression 10/16/2017    Ankle swelling 06/01/2015   Dizziness 03/26/2015   CAD (coronary artery disease) 12/29/2012   HTN (hypertension) 12/29/2012   Hyperlipidemia 12/29/2012    Past Medical History:  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 Bishop Limbo)   CORONARY ARTERY BYPASS GRAFT  05/09/2007   LIMA to LAD, veing to intermediate; SVG to OM1 & OM2; SVG to acute marginal & distal RCA (Dr. Dorris Fetch)   TRANSTHORACIC ECHOCARDIOGRAM  02/13/2010   EF =>55%, vigorous contraction EF 65%; LA mild-mod dilated; IV normal diameter - normal CVP; trace MR; mild TR; trace AV regurg    Past Obstetrical History:  OB History  Gravida Para Term Preterm AB Living  8  8 8     2   SAB IAB Ectopic Multiple Live Births          8    # Outcome Date GA Lbr Len/2nd Weight Sex Type Anes PTL Lv  8 Term           7 Term           6 Term           5 Term           4 Term           3 Term           2 Term           1 Term             Obstetric Comments  All vaginal deliveries   Past Gynecological History: As per HPI. History of HRT use: No.  Social History:  Social History   Socioeconomic History   Marital status: Widowed    Spouse name: Not on file   Number of  children: 7   Years of education: Not on file   Highest education level: Not on file  Occupational History   Not on file  Tobacco Use   Smoking status: Never   Smokeless tobacco: Never   Tobacco comments:    quit 1960's  smoked very lightly.  Substance and Sexual Activity   Alcohol use: No    Alcohol/week: 0.0 standard drinks of alcohol   Drug use: No   Sexual activity: Not on file  Other Topics Concern   Not on file  Social History Narrative   Married.   Retired.   Works as a Engineer, structural.    Enjoys helping other.    Social Determinants of Health   Financial Resource Strain: Low Risk  (02/09/2022)   Overall Financial Resource Strain (CARDIA)    Difficulty of Paying Living Expenses: Not hard at all  Food Insecurity: No Food Insecurity (05/21/2022)   Hunger Vital Sign    Worried About Running Out of Food in the Last Year: Never true    Ran Out of Food in the Last Year: Never true  Transportation Needs: No Transportation Needs (05/24/2022)   PRAPARE - Administrator, Civil Service (Medical): No    Lack of Transportation (Non-Medical): No  Physical Activity: Inactive (02/08/2021)   Exercise Vital Sign    Days of Exercise per Week: 0 days    Minutes of Exercise per Session: 0 min  Stress: No Stress Concern Present (02/09/2022)   [employer] of Occupational Health - Occupational Stress Questionnaire    Feeling of Stress : Only a little  Social Connections: Moderately Isolated (02/09/2022)   Social Connection and Isolation Panel [NHANES]    Frequency of Communication with Friends and Family: More than three times a week    Frequency of Social Gatherings with Friends and Family: More than three times a week    Attends Religious Services: More than 4 times per year    Active Member of [employer] or Organizations: No    Attends Banker Meetings: Never    Marital Status: Widowed  Intimate Partner Violence: Not At Risk (02/09/2022)   Humiliation, Afraid, Rape,  and Kick questionnaire    Fear of Current or Ex-Partner: No    Emotionally Abused: No    Physically Abused: No    Sexually Abused: No    Family History:  Family History  Problem Relation Age  of Onset   Cancer Mother    Diabetes Sister    Cancer Child     Medications Craig C. Ruddy had no medications administered during this visit. Current Outpatient Medications  Medication Sig Dispense Refill   Acetaminophen (TYLENOL 8 HOUR PO) Take 1 tablet by mouth every 8 (eight) hours as needed.      aspirin 81 MG tablet Take 81 mg by mouth daily.     Azelastine HCl 137 MCG/SPRAY SOLN As needed     Blood Glucose Monitoring Suppl (ACCU-CHEK GUIDE) w/Device KIT Use as instructed to check blood sugar. 1 kit 0   Cholecalciferol (VITAMIN D) 50 MCG (2000 UT) CAPS Take 1 capsule by mouth daily.     Continuous Glucose Receiver (FREESTYLE LIBRE 2 READER) DEVI Use to check blood sugars. 1 each 0   Continuous Glucose Sensor (FREESTYLE LIBRE 2 SENSOR) MISC Apply every 14 days to check blood sugars. 6 each 1   Continuous Glucose Sensor (FREESTYLE LIBRE 3 SENSOR) MISC APPLY SENSOR EVERY 14 DAYS TO CONTINOUSLY MONITOR GLUCOSE 6 each 1   cyclobenzaprine (FLEXERIL) 5 MG tablet TAKE 1 TABLET BY MOUTH THREE TIMES DAILY AS NEEDED FOR MUSCLE SPASM MAY CAUSE DROWSINESS 30 tablet 0   ezetimibe (ZETIA) 10 MG tablet Take 1 tablet by mouth once daily 90 tablet 0   fexofenadine (ALLEGRA) 180 MG tablet Take 180 mg by mouth daily.     glucose blood (ACCU-CHEK GUIDE) test strip USE TO CHECK SUGAR THREE TIMES DAILY AS DIRECTED 250 each 3   Insulin Glargine-Lixisenatide (SOLIQUA) 100-33 UNT-MCG/ML SOPN Inject 22 Units into the skin daily. for diabetes. 30 mL 0   Insulin Pen Needle (BD PEN NEEDLE NANO 2ND GEN) 32G X 4 MM MISC USE 1 PEN NEEDLE WITH INSULIN PEN once daily 100 each 3   losartan (COZAAR) 100 MG tablet Take 1 tablet (100 mg total) by mouth daily. 90 tablet 2   metoprolol succinate (TOPROL-XL) 50 MG 24 hr tablet TAKE  1 TABLET BY MOUTH IN THE MORNING AND 1/2 (ONE-HALF)  TAB IN THE EVENING 135 tablet 3   Multiple Vitamin (MULTIVITAMIN) capsule Take 1 capsule by mouth daily.     pantoprazole (PROTONIX) 40 MG tablet Take 1 tablet by mouth once daily 90 tablet 3   Polyethyl Glycol-Propyl Glycol (SYSTANE OP) Apply 2 drops to eye 2 (two) times daily.      rosuvastatin (CRESTOR) 10 MG tablet Take 1 tablet (10 mg total) by mouth daily. for cholesterol. 90 tablet 3   TRAVATAN Z 0.004 % SOLN ophthalmic solution Place 1 drop into both eyes at bedtime.     No current facility-administered medications for this visit.    Allergies Patient has no known allergies.   Physical Exam:  BP 136/73   Pulse 68   Wt 122 lb (55.3 kg)   BMI 21.61 kg/m  Body mass index is 21.61 kg/m. General appearance: Well nourished, well developed female in no acute distress.  Respiratory: Normal respiratory effort Abdomen: positive bowel sounds and no masses, hernias; diffusely non tender to palpation, non distended Neuro/Psych:  Normal mood and affect.  Skin:  Warm and dry.  Lymphatic:  No inguinal lymphadenopathy.   Cervical exam performed in the presence of a chaperone Pelvic exam: is not limited by body habitus EGBUS: within normal limits, moderate atrophy Vagina: moderate atrophy with friability on speculum exam, no VB or discharge Cervix: near flush with the vaginal wall; dimple seen, not patent Uterus:  8wk sized uterus, mobile,  mildly ttp on bimanual exam.  Adnexa:  no mass, fullness, tenderness Rectovaginal: deferred  Laboratory: none  Radiology:  Images reviewed Narrative & Impression  CLINICAL DATA:  Right lower quadrant pain   EXAM: CT ABDOMEN AND PELVIS WITHOUT CONTRAST   TECHNIQUE: Multidetector CT imaging of the abdomen and pelvis was performed following the standard protocol without IV contrast.   RADIATION DOSE REDUCTION: This exam was performed according to the departmental dose-optimization program  which includes automated exposure control, adjustment of the mA and/or kV according to patient size and/or use of iterative reconstruction technique.   COMPARISON:  Pelvic ultrasound 08/16/2021   FINDINGS: Lower chest: No acute abnormality.   Hepatobiliary: No focal liver abnormality is seen. No gallstones, gallbladder wall thickening, or biliary dilatation.   Pancreas: Unremarkable. No pancreatic ductal dilatation or surrounding inflammatory changes.   Spleen: Normal in size without focal abnormality.   Adrenals/Urinary Tract: Adrenal glands are unremarkable. Kidneys are normal, without renal calculi, focal lesion, or hydronephrosis. Bladder is unremarkable.   Stomach/Bowel: Stomach is within normal limits. Appendix appears normal. No evidence of bowel wall thickening, distention, or inflammatory changes. There are scattered colonic diverticula.   Vascular/Lymphatic: Aortic atherosclerosis. No enlarged abdominal or pelvic lymph nodes.   Reproductive: Calcified uterine fibroids are present including a large exophytic fibroid in the right adnexa which is heavily calcified measuring 6.2 x 6.3 cm similar to prior ultrasound. The ovaries are not well delineated on this study.   Other: No abdominal wall hernia or abnormality. No abdominopelvic ascites.   Musculoskeletal: Multilevel degenerative changes are seen throughout the spine. Chronic T12 compression fractures present.   IMPRESSION: 1. No acute localizing process in the abdomen or pelvis. 2. Colonic diverticulosis. 3. Calcified uterine fibroids. 4. Aortic atherosclerosis.   Aortic Atherosclerosis (ICD10-I70.0).     Electronically Signed   By: Darliss Cheney M.D.   On: 09/21/2022 20:02   Narrative & Impression  CLINICAL DATA:  Evaluate pelvic mass.   EXAM: TRANSABDOMINAL AND TRANSVAGINAL ULTRASOUND OF PELVIS   TECHNIQUE: Both transabdominal and transvaginal ultrasound examinations of the pelvis were  performed. Transabdominal technique was performed for global imaging of the pelvis including uterus, ovaries, adnexal regions, and pelvic cul-de-sac. It was necessary to proceed with endovaginal exam following the transabdominal exam to visualize the uterus, endometrium, bilateral ovaries and bilateral adnexa.   COMPARISON:  None Available.   FINDINGS: Uterus   Measurements: 8.3 cm x 5.1 cm x 5.3 cm = volume: 116.34 mL. A 5.6 cm x 5.0 cm x 4.3 cm heterogeneous hypoechoic soft tissue mass is seen within the uterine fundus. This area contains multiple shadowing calcifications.   Endometrium   Thickness: N/A.  Poorly visualized.   Right ovary   The right ovary is not visualized. A 7.3 cm heterogeneous, partially calcified structure is seen within the right adnexa.   Left ovary   The left ovary is not visualized.   Other findings   No abnormal free fluid.   IMPRESSION: 1. Findings likely consistent with a partially calcified uterine fibroid. 2. Additional findings which may represent a large, exophytic uterine fibroid within the right adnexa.     Electronically Signed   By: Aram Candela M.D.   On: 08/16/2021 23:22   Assessment: patient stable  Plan:  1. Pelvic pain Pap done, as well as urine culture and ca125 testing. I told her that once everything is back that I'll touch base with Gyn Onc to see about any additional follow up and/or if they  feel that they should evaluate her.   2. Uterine leiomyoma, unspecified location  Orders Placed This Encounter  Procedures   Urine Culture    RTC PRN  No follow-ups on file.  Future Appointments  Date Time Provider Department Center  11/06/2022 10:20 AM Lennette Bihari, MD [facility] None  02/12/2023 10:45 AM [facility] ANNUAL WELLNESS VISIT [facility]    Cornelia Copa MD Attending [facility])

## 2022-10-16 NOTE — Telephone Encounter (Signed)
Unable to reach patient. Left voicemail to return call to our office.   

## 2022-10-16 NOTE — Telephone Encounter (Addendum)
Noted.  Please notify patient and her daughter that I sent a prescription for the freestyle libre 2 sensors and reader.  Let them know that the sensors for the 3 are backordered.

## 2022-10-16 NOTE — Progress Notes (Signed)
New  Gyn   CC: Pelvic pain pt takes tylenol for relief.  Pain is more on the right side . Pt states she had imaging study done and showed fibroids. Pain is 10/10x when flared up no pain today. Pt states she sometimes feels as if she has gas.

## 2022-10-16 NOTE — Telephone Encounter (Signed)
Called and spoke with patients daughter, advised of Laura Ferguson message. She asked me to call and speak with patient. Called and advised patient, she stated the pharmacy was unable to fill the refills for the current sensors due to them not being made. Patient states she has her last sensor on right now.   Called and verified with pharmacy, there is a Corporate treasurer on the freestyle 3 sensors, pharmacist stated this is the 3rd week of being backordered and they do not have an estimated time of getting them. Pharmacist stated the only option is to go backwards and order the freestyle 2 reader and sensors, they are able to get these filled no problem.

## 2022-10-16 NOTE — Addendum Note (Signed)
Addended by: Doreene Nest on: 10/16/2022 01:16 PM   Modules accepted: Orders

## 2022-10-17 LAB — URINE CULTURE

## 2022-10-17 LAB — CA 125: Cancer Antigen (CA) 125: 14.1 U/mL (ref 0.0–38.1)

## 2022-10-17 NOTE — Telephone Encounter (Signed)
Called and notified patient and patients daughter.

## 2022-10-22 LAB — CYTOLOGY - PAP
Adequacy: ABSENT
Chlamydia: NEGATIVE
Comment: NEGATIVE
Comment: NEGATIVE
Comment: NEGATIVE
Comment: NORMAL
Diagnosis: NEGATIVE
High risk HPV: NEGATIVE
Neisseria Gonorrhea: NEGATIVE
Trichomonas: NEGATIVE

## 2022-10-23 ENCOUNTER — Telehealth: Payer: Self-pay | Admitting: Primary Care

## 2022-10-23 NOTE — Telephone Encounter (Signed)
Patient is having issues setting up her freestyle libre sensor, asked for a call back when able. Patient is currently using her old sensor and has some questions regarding new sensor. Patient asked for a call back when possible regarding this. Can be reached at [phone]

## 2022-10-25 ENCOUNTER — Telehealth: Payer: Self-pay | Admitting: *Deleted

## 2022-10-25 ENCOUNTER — Other Ambulatory Visit: Payer: Self-pay | Admitting: Pharmacist

## 2022-10-25 NOTE — Telephone Encounter (Signed)
-----   Message from Vernon J sent at 10/25/2022 11:05 AM EDT ----- Regarding: Lab Results Pt daughter called in [facility] ) wanted to know what her next steps are after the pap on 10/1. Mom Charise would like to know. Please call Amonie w/ results and recommendations at [phone].

## 2022-10-25 NOTE — Telephone Encounter (Signed)
Called pts daughter to inform her of results, and also left message on pts phone that her results where all normal and that Dr Vergie Living may reach back out once he is back in the office.

## 2022-10-25 NOTE — Progress Notes (Signed)
Called Ms Trivett regarding concern with new Libre 3 sensor.   Spoke to daughter who states that she has helped her with placing the new sensor. Herself and her husband walked patient through how to apply the new sensor.   She states that patient was applying the Libre2 sensor without any issues previously. Feels that it will just take some practice and getting used to for the [place] 3 sensors.   We did discuss the differences between the [place] 2 and [place] 3 including no requirement to scan, and simpler application process with the [place] 3.   Spoke to patient as well. Her main concern was with the difference in the new sensor compared to the [place] 2. Was confused about the single applicator (rather than the [place] 2 where 2 pieces needed to be fitted together).   Denies current concerns with Josephine Igo 3 currently. Does feel that it may be useful to schedule an in-person   Current sensor expires in 13 days, 11/07/22.  Will schedule patient for an in-person training.  Advised her to bring ALL supplies with her to the visit (all sensors, old and new ones with her receiver/hone)    Future Appointments  Date Time Provider Department Center  11/06/2022 10:20 AM Lennette Bihari, MD [facility] None  11/07/2022 11:30 AM [facility] CCM PHARMACIST [facility]  02/12/2023 10:45 AM [facility] ANNUAL WELLNESS VISIT [facility]   [name], PharmD Clinical Pharmacist [facility] [phone]

## 2022-11-01 DIAGNOSIS — H26493 Other secondary cataract, bilateral: Secondary | ICD-10-CM | POA: Diagnosis not present

## 2022-11-02 ENCOUNTER — Telehealth: Payer: Self-pay

## 2022-11-02 NOTE — Telephone Encounter (Signed)
Left message regarding message from Dr. Vergie Living.

## 2022-11-06 ENCOUNTER — Encounter: Payer: Self-pay | Admitting: Cardiovascular Disease

## 2022-11-06 ENCOUNTER — Telehealth: Payer: Self-pay | Admitting: Primary Care

## 2022-11-06 ENCOUNTER — Other Ambulatory Visit: Payer: Self-pay | Admitting: Pharmacist

## 2022-11-06 ENCOUNTER — Other Ambulatory Visit: Payer: Self-pay | Admitting: Cardiovascular Disease

## 2022-11-06 ENCOUNTER — Ambulatory Visit: Payer: Medicare HMO | Attending: Cardiovascular Disease | Admitting: Cardiovascular Disease

## 2022-11-06 DIAGNOSIS — I10 Essential (primary) hypertension: Secondary | ICD-10-CM | POA: Diagnosis not present

## 2022-11-06 DIAGNOSIS — R002 Palpitations: Secondary | ICD-10-CM

## 2022-11-06 DIAGNOSIS — K219 Gastro-esophageal reflux disease without esophagitis: Secondary | ICD-10-CM

## 2022-11-06 DIAGNOSIS — Z951 Presence of aortocoronary bypass graft: Secondary | ICD-10-CM

## 2022-11-06 DIAGNOSIS — E1159 Type 2 diabetes mellitus with other circulatory complications: Secondary | ICD-10-CM

## 2022-11-06 DIAGNOSIS — E785 Hyperlipidemia, unspecified: Secondary | ICD-10-CM | POA: Diagnosis not present

## 2022-11-06 DIAGNOSIS — Z09 Encounter for follow-up examination after completed treatment for conditions other than malignant neoplasm: Secondary | ICD-10-CM

## 2022-11-06 DIAGNOSIS — Z794 Long term (current) use of insulin: Secondary | ICD-10-CM | POA: Diagnosis not present

## 2022-11-06 DIAGNOSIS — I2510 Atherosclerotic heart disease of native coronary artery without angina pectoris: Secondary | ICD-10-CM | POA: Diagnosis not present

## 2022-11-06 MED ORDER — METOPROLOL SUCCINATE ER 50 MG PO TB24
50.0000 mg | ORAL_TABLET | Freq: Two times a day (BID) | ORAL | 3 refills | Status: DC
Start: 2022-11-06 — End: 2022-11-06

## 2022-11-06 NOTE — Progress Notes (Signed)
Laura Ferguson left a voicemail stating that she needs to change her upcoming appointment on 10/23 (is for CGM application/teaching).  Patient had forgotten about the appointment and had her daughter place a new sensor on Monday, 11/05/22.   Spoke to patient's daughter who also left a similar voicemail. She asks if we can move her appointment to ~2 weeks when her next sensor is due to be placed. She is aware that I am in [facility] office Wed/Thurs.   Called patient and did not get an answer. Left patient voicemail with callback number so we can choose a date to reschedule her visit.  [name], PharmD Clinical Pharmacist [facility] [phone]

## 2022-11-06 NOTE — Patient Instructions (Signed)
Medication Instructions:  Increase Metoprolol 50mg  Twice a day  *If you need a refill on your cardiac medications before your next appointment, please call your pharmacy*   Lab Work: None  If you have labs (blood work) drawn today and your tests are completely normal, you will receive your results only by: MyChart Message (if you have MyChart) OR A paper copy in the mail If you have any lab test that is abnormal or we need to change your treatment, we will call you to review the results.   Testing/Procedures: None    Follow-Up: At [facility], you and your health needs are our priority.  As part of our continuing mission to provide you with exceptional heart care, we have created designated Provider Care Teams.  These Care Teams include your primary Cardiologist (physician) and Advanced Practice Providers (APPs -  Physician Assistants and Nurse Practitioners) who all work together to provide you with the care you need, when you need it.  We recommend signing up for the patient portal called "MyChart".  Sign up information is provided on this After Visit Summary.  MyChart is used to connect with patients for Virtual Visits (Telemedicine).  Patients are able to view lab/test results, encounter notes, upcoming appointments, etc.  Non-urgent messages can be sent to your provider as well.   To learn more about what you can do with MyChart, go to [email or web address].    Your next appointment:   5-6 month(s)  Provider:   Nicki Guadalajara, MD
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
## 2022-11-06 NOTE — Telephone Encounter (Signed)
Patient wanted to give an update regarding her cgm sensor, pt says she was able to get there sensor on and wanted Lillia Abed to be aware. Please advise patient if needed at [phone]

## 2022-11-07 ENCOUNTER — Ambulatory Visit: Payer: Medicare HMO

## 2022-11-07 ENCOUNTER — Other Ambulatory Visit: Payer: Self-pay | Admitting: Obstetrics and Gynecology

## 2022-11-07 ENCOUNTER — Telehealth: Payer: Self-pay

## 2022-11-07 DIAGNOSIS — D259 Leiomyoma of uterus, unspecified: Secondary | ICD-10-CM

## 2022-11-07 DIAGNOSIS — R102 Pelvic and perineal pain: Secondary | ICD-10-CM

## 2022-11-07 NOTE — Progress Notes (Signed)
GYN Note  I spoke to Dr. Pricilla Holm, of GYN Oncology, and she recommends getting an MRI to try and assess the lining of the uterus. If lining appears abnormal or if patient continues to have pain, she recommends going to the OR for an EUA and D&C  Message sent to office to convey this to patient, schedule MRI and follow up visit with me  Cornelia Copa MD Attending Center for [facility] (Faculty Practice) 11/07/2022 Time: [phone]

## 2022-11-07 NOTE — Telephone Encounter (Signed)
Left message for pt daughter Rene Kocher with Dr. Vergie Living recommendations on pts MRI.

## 2022-11-09 ENCOUNTER — Other Ambulatory Visit: Payer: Self-pay | Admitting: Obstetrics and Gynecology

## 2022-11-09 DIAGNOSIS — R102 Pelvic and perineal pain: Secondary | ICD-10-CM

## 2022-11-09 DIAGNOSIS — D219 Benign neoplasm of connective and other soft tissue, unspecified: Secondary | ICD-10-CM

## 2022-11-09 DIAGNOSIS — Z780 Asymptomatic menopausal state: Secondary | ICD-10-CM

## 2022-11-13 ENCOUNTER — Ambulatory Visit
Admission: RE | Admit: 2022-11-13 | Discharge: 2022-11-13 | Disposition: A | Discharge status: Home / Self Care | Payer: Medicare HMO | Source: Ambulatory Visit | Attending: Obstetrics and Gynecology | Admitting: Obstetrics and Gynecology

## 2022-11-13 DIAGNOSIS — Z780 Asymptomatic menopausal state: Secondary | ICD-10-CM | POA: Diagnosis not present

## 2022-11-13 DIAGNOSIS — R102 Pelvic and perineal pain: Secondary | ICD-10-CM | POA: Diagnosis not present

## 2022-11-13 DIAGNOSIS — D219 Benign neoplasm of connective and other soft tissue, unspecified: Secondary | ICD-10-CM

## 2022-11-13 DIAGNOSIS — M160 Bilateral primary osteoarthritis of hip: Secondary | ICD-10-CM | POA: Diagnosis not present

## 2022-11-13 DIAGNOSIS — D259 Leiomyoma of uterus, unspecified: Secondary | ICD-10-CM | POA: Diagnosis not present

## 2022-11-13 MED ORDER — GADOBUTROL 1 MMOL/ML IV SOLN
6.0000 mL | Freq: Once | INTRAVENOUS | Status: AC | PRN
  Administered 2022-11-13: 6 mL via INTRAVENOUS

## 2022-11-16 ENCOUNTER — Encounter: Payer: Self-pay | Admitting: Pharmacist

## 2022-11-16 NOTE — Progress Notes (Signed)
Called Laura Ferguson to confirm appointment for CGM teaching next week. Her daughter had previously requested her appointment be moved because patient had forgotten last appointment and had already placed a new sensor.   Sensor is due for change on Monday. Tentatively scheduled for Wednesday for CGM review/teaching.  Left message with my callback number.   [name], PharmD Clinical Pharmacist [facility] [phone]  Future Appointments  Date Time Provider Department Center  11/21/2022  1:30 PM [facility] CCM PHARMACIST [facility]  12/06/2022  1:50 PM Gridley Bing, MD [facility]  02/12/2023 10:45 AM [facility] ANNUAL WELLNESS VISIT [facility]

## 2022-11-20 DIAGNOSIS — E113313 Type 2 diabetes mellitus with moderate nonproliferative diabetic retinopathy with macular edema, bilateral: Secondary | ICD-10-CM | POA: Diagnosis not present

## 2022-11-20 DIAGNOSIS — H35373 Puckering of macula, bilateral: Secondary | ICD-10-CM | POA: Diagnosis not present

## 2022-11-20 DIAGNOSIS — H401132 Primary open-angle glaucoma, bilateral, moderate stage: Secondary | ICD-10-CM | POA: Diagnosis not present

## 2022-11-20 DIAGNOSIS — H43813 Vitreous degeneration, bilateral: Secondary | ICD-10-CM | POA: Diagnosis not present

## 2022-11-20 DIAGNOSIS — H35033 Hypertensive retinopathy, bilateral: Secondary | ICD-10-CM | POA: Diagnosis not present

## 2022-11-21 ENCOUNTER — Ambulatory Visit: Payer: Medicare HMO

## 2022-11-22 DIAGNOSIS — H903 Sensorineural hearing loss, bilateral: Secondary | ICD-10-CM | POA: Diagnosis not present

## 2022-11-22 DIAGNOSIS — J300 Vasomotor rhinitis: Secondary | ICD-10-CM | POA: Diagnosis not present

## 2022-11-22 DIAGNOSIS — R42 Dizziness and giddiness: Secondary | ICD-10-CM | POA: Diagnosis not present

## 2022-11-27 ENCOUNTER — Telehealth: Payer: Self-pay | Admitting: Obstetrics and Gynecology

## 2022-11-27 NOTE — Telephone Encounter (Signed)
GYN Telephone Note MRI results d/w pt and daughter. I told her that given normal appearing endometrial strip and MRI confirming fibroids that I wouldn't recommend acting on the MRI if she's asymptomatic. She states that she feels much better and feels s/s are gas related and/or back related. Patient still w/o any VB, spotting or discharge.   I told her to let me know if anything changes and I recommend expectant management for now  Cornelia Copa MD Attending Center for [facility] (Faculty Practice) 11/27/2022 Time: [phone]

## 2022-11-28 ENCOUNTER — Ambulatory Visit (INDEPENDENT_AMBULATORY_CARE_PROVIDER_SITE_OTHER): Payer: Medicare HMO | Admitting: Pharmacist

## 2022-11-28 DIAGNOSIS — Z794 Long term (current) use of insulin: Secondary | ICD-10-CM | POA: Diagnosis not present

## 2022-11-28 DIAGNOSIS — E119 Type 2 diabetes mellitus without complications: Secondary | ICD-10-CM | POA: Diagnosis not present

## 2022-11-28 MED ORDER — FREESTYLE LIBRE 3 PLUS SENSOR MISC: 3 refills | Status: DC

## 2022-11-30 NOTE — Progress Notes (Signed)
Saw Laura Ferguson today for a CGM teaching visit per patient request. She is accompanied by her daughter, and has brought her Josephine Igo 2 sensor with her.   Patient has been frustrated with the [facility] 2, as she was previously using the 3 which was easier for her to apply. She has been confused about the multiple components of the [place] 2 applicator and which part the needle is contained.   We reviewed in detail the different components of the [place] 2 sensor, and patient was provided with a labeled picture of the two pieces. We walked through piecing the applicator together, and patient realized what she had been doing wrong.   Patient successfully demonstrated placement of Libre 2 sensor in clinic today and performed teachback without concerns.  She prefers Libre 3 which has been on backorder at her pharmacy, so she has been receiving Libre 2. Patient does confirm that she has both readers at home [facility] 2 reader, [place] 3 reader). We again discussed the difference between [place] 2 and 3. She has one additional Libre 2 sensor remaining, then the hope is we will get her back on [place] 3.   Rx for the [place] 3 plus sent to patient's pharmacy.  Libre 3 production has stopped in favor of newer Libre 3 plus which will continue to be compatible with the same reader/apps. Confirmed with patient's pharmacy they they received the prescription though they do not have the full quantity in stock.   Update, :  Called pharmacy again 11/15 and confirmed that claim went through patients insurance. They report patient picked up her new sensors.  Spoke with Laura Pizzola and her daughter today who confirm possession of 3 plus sensors. Patient is thankful, advised her to call my office line when it is time to place a new sensor should she have any concerns/questions.    [name], PharmD Clinical Pharmacist [facility] [phone]

## 2022-12-06 ENCOUNTER — Ambulatory Visit: Payer: Medicare HMO | Admitting: Obstetrics and Gynecology

## 2022-12-18 DIAGNOSIS — H6063 Unspecified chronic otitis externa, bilateral: Secondary | ICD-10-CM | POA: Diagnosis not present

## 2022-12-18 DIAGNOSIS — J300 Vasomotor rhinitis: Secondary | ICD-10-CM | POA: Diagnosis not present

## 2022-12-18 DIAGNOSIS — H903 Sensorineural hearing loss, bilateral: Secondary | ICD-10-CM | POA: Diagnosis not present

## 2022-12-18 DIAGNOSIS — R42 Dizziness and giddiness: Secondary | ICD-10-CM | POA: Diagnosis not present

## 2022-12-21 DIAGNOSIS — H43813 Vitreous degeneration, bilateral: Secondary | ICD-10-CM | POA: Diagnosis not present

## 2022-12-21 DIAGNOSIS — H35373 Puckering of macula, bilateral: Secondary | ICD-10-CM | POA: Diagnosis not present

## 2022-12-21 DIAGNOSIS — H35033 Hypertensive retinopathy, bilateral: Secondary | ICD-10-CM | POA: Diagnosis not present

## 2022-12-21 DIAGNOSIS — H401132 Primary open-angle glaucoma, bilateral, moderate stage: Secondary | ICD-10-CM | POA: Diagnosis not present

## 2022-12-21 DIAGNOSIS — E113311 Type 2 diabetes mellitus with moderate nonproliferative diabetic retinopathy with macular edema, right eye: Secondary | ICD-10-CM | POA: Diagnosis not present

## 2023-01-12 ENCOUNTER — Other Ambulatory Visit: Payer: Self-pay | Admitting: Cardiovascular Disease

## 2023-01-12 DIAGNOSIS — I10 Essential (primary) hypertension: Secondary | ICD-10-CM

## 2023-01-22 ENCOUNTER — Ambulatory Visit: Payer: Medicare HMO

## 2023-01-22 VITALS — Ht 63.0 in | Wt 121.0 lb

## 2023-01-22 DIAGNOSIS — Z0000 Encounter for general adult medical examination without abnormal findings: Secondary | ICD-10-CM | POA: Diagnosis not present

## 2023-01-22 NOTE — Patient Instructions (Signed)
 Laura Ferguson , Thank you for taking time to come for your Medicare Wellness Visit. I appreciate your ongoing commitment to your health goals. Please review the following plan we discussed and let me know if I can assist you in the future.   Referrals/Orders/Follow-Ups/Clinician Recommendations: none  This is a list of the screening recommended for you and due dates:  Health Maintenance  Topic Date Due   Zoster (Shingles) Vaccine (1 of 2) Never done   DEXA scan (bone density measurement)  Never done   COVID-19 Vaccine (6 - 2024-25 season) 09/16/2022   Flu Shot  04/15/2023*   Eye exam for diabetics  03/08/2023   Hemoglobin A1C  03/21/2023   Complete foot exam   09/21/2023   Medicare Annual Wellness Visit  01/22/2024   Pneumonia Vaccine  Completed   HPV Vaccine  Aged Out   DTaP/Tdap/Td vaccine  Discontinued  *Topic was postponed. The date shown is not the original due date.    Advanced directives: (Declined) Advance directive discussed with you today. Even though you declined this today, please call our office should you change your mind, and we can give you the proper paperwork for you to fill out.  Next Medicare Annual Wellness Visit scheduled for next year: Yes 01/23/2024 @ 11:30am televisit

## 2023-01-22 NOTE — Progress Notes (Signed)
 Subjective:   Laura Ferguson is a 89 y.o. female who presents for Medicare Annual (Subsequent) preventive examination.  Visit Complete: Virtual I connected with  [name] on 01/22/23 by a audio enabled telemedicine application and verified that I am speaking with the correct person using two identifiers.  Patient Location: Home  Provider Location: Office/Clinic  I discussed the limitations of evaluation and management by telemedicine. The patient expressed understanding and agreed to proceed.  Vital Signs: Because this visit was a virtual/telehealth visit, some criteria may be missing or patient reported. Any vitals not documented were not able to be obtained and vitals that have been documented are patient reported.  Patient Medicare AWV questionnaire was completed by the patient on (not done); I have confirmed that all information answered by patient is correct and no changes since this date.  Cardiac Risk Factors include: advanced age (>77men, >82 women);diabetes mellitus;dyslipidemia;hypertension;sedentary lifestyle    Objective:    Today's Vitals   01/22/23 1100  Weight: 121 lb (54.9 kg)  Height: 5' 3 (1.6 m)  PainSc: 0-No pain   Body mass index is 21.43 kg/m.     01/22/2023   11:18 AM 02/09/2022    8:46 AM 02/08/2021    8:17 AM 10/13/2019   11:18 AM 05/19/2019    9:50 AM 11/05/2018    6:26 PM 08/09/2016   10:53 AM  Advanced Directives  Does Patient Have a Medical Advance Directive? No No No No No No No  Would patient like information on creating a medical advance directive?  No - Patient declined Yes (MAU/Ambulatory/Procedural Areas - Information given) Yes (MAU/Ambulatory/Procedural Areas - Information given) No - Patient declined No - Patient declined     Current Medications (verified) Outpatient Encounter Medications as of 01/22/2023  Medication Sig   Acetaminophen (TYLENOL 8 HOUR PO) Take 1 tablet by mouth every 8 (eight) hours as needed.    aspirin 81 MG tablet  Take 81 mg by mouth daily.   Azelastine HCl 137 MCG/SPRAY SOLN As needed   Blood Glucose Monitoring Suppl (ACCU-CHEK GUIDE) w/Device KIT Use as instructed to check blood sugar.   Cholecalciferol (VITAMIN D) 50 MCG (2000 UT) CAPS Take 1 capsule by mouth daily.   Continuous Glucose Receiver (FREESTYLE LIBRE 2 READER) DEVI Use to check blood sugars.   Continuous Glucose Sensor (FREESTYLE LIBRE 2 SENSOR) MISC Apply every 14 days to check blood sugars.   Continuous Glucose Sensor (FREESTYLE LIBRE 3 PLUS SENSOR) MISC Change sensor every 15 days.   Continuous Glucose Sensor (FREESTYLE LIBRE 3 SENSOR) MISC APPLY SENSOR EVERY 14 DAYS TO CONTINOUSLY MONITOR GLUCOSE   cyclobenzaprine  (FLEXERIL ) 5 MG tablet TAKE 1 TABLET BY MOUTH THREE TIMES DAILY AS NEEDED FOR MUSCLE SPASM MAY CAUSE DROWSINESS   ezetimibe  (ZETIA ) 10 MG tablet Take 1 tablet by mouth once daily   fexofenadine (ALLEGRA) 180 MG tablet Take 180 mg by mouth daily.   glucose blood (ACCU-CHEK GUIDE) test strip USE TO CHECK SUGAR THREE TIMES DAILY AS DIRECTED   Insulin  Glargine-Lixisenatide (SOLIQUA ) 100-33 UNT-MCG/ML SOPN Inject 22 Units into the skin daily. for diabetes.   Insulin  Pen Needle (BD PEN NEEDLE NANO 2ND GEN) 32G X 4 MM MISC USE 1 PEN NEEDLE WITH INSULIN  PEN once daily   losartan  (COZAAR ) 100 MG tablet Take 1 tablet by mouth once daily   metoprolol  succinate (TOPROL -XL) 50 MG 24 hr tablet TAKE 1 TABLET BY MOUTH IN THE MORNING AND AT BEDTIME TAKE  WITH  OR  IMMEDIATELY  FOLLOWING  A  MEAL   Multiple Vitamin (MULTIVITAMIN) capsule Take 1 capsule by mouth daily.   pantoprazole  (PROTONIX ) 40 MG tablet Take 1 tablet by mouth once daily   Polyethyl Glycol-Propyl Glycol (SYSTANE OP) Apply 2 drops to eye 2 (two) times daily.    rosuvastatin  (CRESTOR ) 10 MG tablet Take 1 tablet (10 mg total) by mouth daily. for cholesterol.   TRAVATAN Z 0.004 % SOLN ophthalmic solution Place 1 drop into both eyes at bedtime.   No facility-administered  encounter medications on file as of 01/22/2023.    Allergies (verified) Patient has no known allergies.   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 IVAR Sor)   CORONARY ARTERY BYPASS GRAFT  05/09/2007   LIMA to LAD, veing to intermediate; SVG to OM1 & OM2; SVG to acute marginal & distal RCA (Dr. Kerrin)   TRANSTHORACIC ECHOCARDIOGRAM  02/13/2010   EF =>55%, vigorous contraction EF 65%; LA mild-mod dilated; IV normal diameter - normal CVP; trace MR; mild TR; trace AV regurg   Family History  Problem Relation Age of Onset   Cancer Mother    Diabetes Sister    Cancer Child    Social History   Socioeconomic History   Marital status: Widowed    Spouse name: Not on file   Number of children: 7   Years of education: Not on file   Highest education level: Not on file  Occupational History   Not on file  Tobacco Use   Smoking status: Never   Smokeless tobacco: Never   Tobacco comments:    quit 1960's  smoked very lightly.  Substance and Sexual Activity   Alcohol use: No    Alcohol/week: 0.0 standard drinks of alcohol   Drug use: No   Sexual activity: Not on file  Other Topics Concern   Not on file  Social History Narrative   Married.    Retired.   Works as a Engineer, Structural.    Enjoys helping other.    Social Drivers of Corporate Investment Banker Strain: Low Risk  (01/22/2023)   Overall Financial Resource Strain (CARDIA)    Difficulty of Paying Living Expenses: Not hard at all  Food Insecurity: No Food Insecurity (01/22/2023)   Hunger Vital Sign    Worried About Running Out of Food in the Last Year: Never true    Ran Out of Food in the Last Year: Never true  Transportation Needs: No Transportation Needs (01/22/2023)   PRAPARE - Administrator, Civil Service (Medical): No    Lack of Transportation (Non-Medical): No  Physical Activity: Inactive (01/22/2023)   Exercise Vital Sign    Days of Exercise  per Week: 0 days    Minutes of Exercise per Session: 0 min  Stress: No Stress Concern Present (01/22/2023)   [employer] of Occupational Health - Occupational Stress Questionnaire    Feeling of Stress : Only a little  Social Connections: Moderately Isolated (01/22/2023)   Social Connection and Isolation Panel [NHANES]    Frequency of Communication with Friends and Family: More than three times a week    Frequency of Social Gatherings with Friends and Family: Once a week    Attends Religious Services: More than 4 times per year    Active Member of [employer] or Organizations: No    Attends Banker Meetings: Never    Marital Status: Widowed    Tobacco Counseling Counseling given: Not Answered Tobacco comments: quit 1960's  smoked very lightly.  Clinical Intake:  Pre-visit preparation completed: Yes  Pain : No/denies pain Pain Score: 0-No pain    BMI - recorded: 21.43 Nutritional Status: BMI of 19-24  Normal Nutritional Risks: None Diabetes: Yes CBG done?: Yes (BS 279 this am at home) CBG resulted in Enter/ Edit results?: No Did pt. bring in CBG monitor from home?: No  How often do you need to have someone help you when you read instructions, pamphlets, or other written materials from your doctor  or pharmacy?: 1 - Never  Interpreter Needed?: No  Comments: lives alone Information entered by :: B.Susan Arana, LPN  Activities of Daily Living    01/22/2023   11:21 AM 02/09/2022    8:39 AM  In your present state of health, do you have any difficulty performing the following activities:  Hearing? 0 0  Vision? 0 0  Difficulty concentrating or making decisions? 1 0  Comment sometimes forget   Walking or climbing stairs? 0 0  Dressing or bathing? 0 0  Doing errands, shopping? 0 0  Preparing Food and eating ? N N  Using the Toilet? N N  In the past six months, have you accidently leaked urine? N N  Do you have problems with loss of bowel control? N N  Managing your Medications? N N  Managing your Finances? N N  Housekeeping or managing your Housekeeping? N N    Patient Care Team: Gretta Comer POUR, NP as PCP - General (Internal Medicine) Burnard Debby LABOR, MD as PCP - Cardiology (Cardiology) Fate Morna SAILOR, [facility] (Inactive) as Pharmacist (Pharmacist) Myrna Adine Anes, MD as Consulting Physician (Ophthalmology)  Indicate any recent Medical Services you may have received from other than [facility] providers in the past year (date may be approximate).     Assessment:   This is a routine wellness examination for Zanetta.  Hearing/Vision screen Hearing Screening - Comments:: Pt says her hearing is good with hearing aids Vision Screening - Comments:: Pt says her vision is good w/glasses Dr Myrna   Goals Addressed             This Visit's Progress    care coordination activities/ community resource needs.   Not on track    Interventions Today    Flowsheet Row Most Recent Value  Chronic Disease   Chronic disease during today's visit Diabetes  General Interventions   General Interventions Discussed/Reviewed [facility], General Interventions Reviewed  [Care coordination services discussed. Completed SDOH survey.  Referred for transportation assistance. Assessed for  nuring needs.  Discussed and encouraged vaccines]           Patient Stated   Not on track    05/19/2019,  I will continue to walk 2 days a week for about 30 minutes.     Patient Stated   On track    Would like to maintain current routine       Depression Screen    01/22/2023   11:14 AM 07/03/2022   12:05 PM 02/09/2022    8:37 AM 10/12/2021    9:00 AM 02/08/2021    8:20 AM 10/13/2019    2:05 PM 09/22/2019   12:15 PM  PHQ 2/9 Scores  PHQ - 2 Score 6 6 1 6 1 5 6   PHQ- 9 Score 10 7  8  8 17     Fall Risk    01/22/2023   11:08 AM 09/21/2022    4:09 PM 07/03/2022   10:56 AM 03/28/2022   10:05 AM 03/16/2022   12:01 PM  Fall Risk   Falls in the past year? 0 0 0 1 1  Number falls in past yr: 0 0 0 0 0  Injury with Fall? 0 0 0 0 0  Risk for fall due to : No Fall Risks No Fall Risks No Fall Risks History of fall(s) History of fall(s)  Follow up Education provided;Falls prevention discussed Falls evaluation completed Falls evaluation completed Falls evaluation completed Falls evaluation completed    MEDICARE RISK AT HOME: Medicare Risk at Home Any stairs in or around the home?: No If so, are there any without handrails?: No Home free of loose throw rugs in walkways, pet beds, electrical cords, etc?: Yes Adequate lighting in your home to reduce risk of falls?: Yes Life alert?: No Use of a cane, walker or w/c?: No Grab bars in the bathroom?: No Shower chair or bench in shower?: Yes Elevated toilet seat or a handicapped toilet?: No  TIMED UP AND GO:  Was the test performed?  No    Cognitive Function:    05/19/2019    9:54 AM  MMSE - Mini Mental State Exam  Orientation to time 5  Orientation to Place 5  Registration 3  Attention/ Calculation 5  Recall 3  Language- repeat 1        01/22/2023   11:28 AM 02/09/2022    8:45 AM  6CIT Screen  What Year? 0 points 0 points  What month? 0 points 0 points  What time? 0 points 0 points  Count back from 20 0 points 0 points  Months in  reverse 4 points 0 points  Repeat phrase 2 points 0 points  Total Score 6 points 0 points    Immunizations Immunization History  Administered Date(s) Administered   Fluad Quad(high Dose 65+) 10/16/2018, 10/13/2019, 09/28/2021   Influenza,inj,Quad PF,6+ Mos 09/25/2017   PFIZER Comirnaty(Gray Top)Covid-19 Tri-Sucrose Vaccine 07/05/2020   PFIZER(Purple Top)SARS-COV-2 Vaccination 03/15/2019, 04/04/2019, 09/15/2019   Pfizer Covid-19 Vaccine Bivalent Booster 52yrs & up 01/02/2021   Pneumococcal Conjugate-13 10/13/2019   Pneumococcal Polysaccharide-23 10/12/2021    TDAP status: Up to date  Flu Vaccine: pt says she had 2024 (not seen in chart)  Pneumococcal vaccine status: Up to date  Covid-19 vaccine status: Completed vaccines  Qualifies for Shingles Vaccine? Yes   Zostavax completed No  Shingrix Completed?: No.    Education has been provided regarding the importance of this vaccine. Patient has been advised to call insurance company to determine out of pocket expense if they have not yet received this vaccine. Advised may also receive vaccine at local pharmacy or Health Dept. Verbalized acceptance and understanding.  Screening Tests Health Maintenance  Topic Date Due   INFLUENZA VACCINE  04/15/2023 (Originally 08/16/2022)   COVID-19 Vaccine (6 - 2024-25 season) 04/15/2023 (Originally 09/16/2022)   Zoster Vaccines- Shingrix (1 of 2) 04/22/2023 (Originally 06/13/1951)   DEXA SCAN  01/22/2024 (Originally 06/14/1999)   OPHTHALMOLOGY EXAM  03/08/2023   HEMOGLOBIN A1C  03/21/2023   FOOT EXAM  09/21/2023   Medicare Annual Wellness (AWV)  01/22/2024   Pneumonia Vaccine 39+ Years old  Completed   HPV VACCINES  Aged Out   DTaP/Tdap/Td  Discontinued    Health Maintenance  There are no preventive care reminders to display for this patient.   Colorectal cancer screening: No longer required.   Mammogram status: No longer required due to age.  Lung Cancer Screening: (Low Dose CT Chest  recommended if Age 61-80 years, 20 pack-year currently smoking OR have quit w/in 15years.) does not qualify.   Lung Cancer Screening Referral: no  Additional Screening:  Hepatitis C Screening: does not qualify; Completed no  Vision Screening: Recommended annual ophthalmology exams for early detection of glaucoma and other disorders of the eye. Is the patient up to date with their annual eye exam?  Yes  Who is the provider or what is the name of the office in which the patient attends annual eye exams? Dr Adine Novak If pt is not established with a provider, would they like to be referred to a provider to establish care? No .   Dental Screening: Recommended annual dental exams for proper oral hygiene  Diabetic Foot Exam: Diabetic Foot Exam: Completed 09/21/22  Community Resource Referral / Chronic Care Management: CRR required this visit?  No   CCM required this visit?  Appt scheduled with PCP    Plan:     I have personally reviewed and noted the following in the patient's chart:   Medical and social history Use of alcohol, tobacco or illicit drugs  Current medications and supplements including opioid prescriptions. Patient is not currently taking opioid prescriptions. Functional ability and status Nutritional status Physical activity Advanced directives List of other physicians Hospitalizations, surgeries, and ER visits in previous 12 months Vitals Screenings to include cognitive, depression, and falls Referrals and appointments  In addition, I have reviewed and discussed with patient certain preventive protocols, quality metrics, and best practice recommendations. A written personalized care plan for preventive services as well as general preventive health recommendations were provided to patient.    Erminio LITTIE Saris, LPN   08/16/7972   After Visit Summary: (Declined) Due to this being a telephonic visit, with patients personalized plan was offered to patient but patient  Declined AVS at this time   Nurse Notes: Pt talks about doing well being independent, taking care of herself but still grieves loss of six of her children. She relays she has a lot on her mind, is depressed and does not sleep well but is able to maintaining the best she can. Pt declines referral for counseling or medication to help depression. Pt also relays her blood sugars are all over the place and she thinks the insulin  is not helping. Appt scheduled with PCP regarding blood sugars/insulin .

## 2023-01-30 ENCOUNTER — Ambulatory Visit: Payer: Medicare HMO | Admitting: Primary Care

## 2023-02-05 DIAGNOSIS — H401132 Primary open-angle glaucoma, bilateral, moderate stage: Secondary | ICD-10-CM | POA: Diagnosis not present

## 2023-02-05 DIAGNOSIS — H35033 Hypertensive retinopathy, bilateral: Secondary | ICD-10-CM | POA: Diagnosis not present

## 2023-02-05 DIAGNOSIS — E113313 Type 2 diabetes mellitus with moderate nonproliferative diabetic retinopathy with macular edema, bilateral: Secondary | ICD-10-CM | POA: Diagnosis not present

## 2023-02-05 DIAGNOSIS — H43813 Vitreous degeneration, bilateral: Secondary | ICD-10-CM | POA: Diagnosis not present

## 2023-02-05 DIAGNOSIS — H35373 Puckering of macula, bilateral: Secondary | ICD-10-CM | POA: Diagnosis not present

## 2023-02-08 ENCOUNTER — Encounter: Payer: Self-pay | Admitting: Primary Care

## 2023-02-08 ENCOUNTER — Ambulatory Visit (INDEPENDENT_AMBULATORY_CARE_PROVIDER_SITE_OTHER): Payer: Medicare HMO | Admitting: Primary Care

## 2023-02-08 VITALS — BP 158/84 | HR 62 | Temp 98.7°F | Ht 63.0 in | Wt 125.0 lb

## 2023-02-08 DIAGNOSIS — F419 Anxiety disorder, unspecified: Secondary | ICD-10-CM | POA: Diagnosis not present

## 2023-02-08 DIAGNOSIS — Z794 Long term (current) use of insulin: Secondary | ICD-10-CM | POA: Diagnosis not present

## 2023-02-08 DIAGNOSIS — E11319 Type 2 diabetes mellitus with unspecified diabetic retinopathy without macular edema: Secondary | ICD-10-CM | POA: Diagnosis not present

## 2023-02-08 DIAGNOSIS — F32A Depression, unspecified: Secondary | ICD-10-CM

## 2023-02-08 LAB — POCT GLYCOSYLATED HEMOGLOBIN (HGB A1C): Hemoglobin A1C: 8.3 % — AB (ref 4.0–5.6)

## 2023-02-08 MED ORDER — SOLIQUA 100-33 UNT-MCG/ML ~~LOC~~ SOPN: 28.0000 [IU] | PEN_INJECTOR | Freq: Every day | SUBCUTANEOUS | Status: DC

## 2023-02-08 MED ORDER — SERTRALINE HCL 25 MG PO TABS: 25.0000 mg | ORAL_TABLET | Freq: Every day | ORAL | 0 refills | Status: DC

## 2023-02-08 NOTE — Assessment & Plan Note (Signed)
Deteriorated with A1c of 8.3 today.  She seems to be caught in a vicious cycle of hypoglycemia in the morning followed by recurrent consumption of sweets with hyperglycemia in the afternoon.  Recommended she start injecting Soliqua 28 units in the morning to prevent next-day hypoglycemia. Strongly advised that she increase protein during the day to help prevent hypoglycemia.  Stop sugary foods/drinks.  Recommended that she contact us in 2 weeks with an update regarding her morning blood sugars.  Follow-up in 3 months.

## 2023-02-08 NOTE — Patient Instructions (Signed)
Move your Soliqua to the morning or early afternoon.  You may continue 28 units for now.  Start sertraline (Zoloft) 25 mg once daily for anxiety and depression.  Take 1 tablet by mouth every day.  Please call me with an update in your sugars in 2 weeks.  Please schedule a follow up visit for 3 months.  It was a pleasure to see you today!

## 2023-02-08 NOTE — Assessment & Plan Note (Signed)
Ongoing.  Strongly advised that she consider treatment with either therapy, medication, or both.  She declines therapy but is willing to try medication again.  Start Zoloft 25 mg daily.  Discussed potential side effects and instructions for use. Will monitor closely.

## 2023-02-08 NOTE — Progress Notes (Signed)
Subjective:    Patient ID: Laura Ferguson, female    DOB: November 26, 1932, 89 y.o.   MRN: [id number]  Depression         JARIYA REICHOW is a very pleasant 89 y.o. female with a history of CAD, hypertension, uncontrolled type 2 diabetes, hyperlipidemia, anxiety and depression who presents today to discuss glucose levels and follow-up of diabetes and to discuss depression.  1) Type 2 Diabetes:   Current medications include: Soliqua 22 units daily. She is actually injecting 28 units at night.  She is checking her blood glucose continuously:  AM fasting: high 50s and 60s. She will eat sugary foods daily to increase her glucose levels.   Before lunch: high 100s to low 200s  Bedtime: 120-130  Last A1C: 7.9 in September 2024, 8.3 today Last Eye Exam: Up-to-date Last Foot Exam: Up-to-date Pneumonia Vaccination: 2023 Urine Microalbumin: Up-to-date Statin: Rosuvastatin  Dietary changes since last visit: She is eating sweets daily to increase AM glucose readings.   Exercise: None  2) Depression and Anxiety: Chronic history. Has lost several of her children over the years.  She has been evaluated for her symptoms numerous times in our office and has been prescribed Lexapro and Zoloft at different times for which she never took. She has declined therapy numerous times in the past.   Today she mentions that she feels continuously depressed. Symptoms include feeling sad and down, tearfulness, doesn't feel like anyone cares. She lives alone, but her family visits often. She denies SI/HI. She is not interested in meeting with a therapist as she doesn't trust others.   She is willing to try Zoloft again.   Review of Systems  Respiratory:  Negative for shortness of breath.   Cardiovascular:  Negative for chest pain.  Psychiatric/Behavioral:  Positive for depression.        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  Substance and Sexual Activity   Alcohol use: No    Alcohol/week: 0.0 standard drinks of alcohol   Drug use: No   Sexual activity: Not on file  Other Topics Concern   Not on file  Social History Narrative   Married.   Retired.   Works as a Engineer, structural.    Enjoys helping other.    Social Drivers of Health   Financial Resource Strain: Low Risk  (01/22/2023)   Overall Financial Resource Strain (CARDIA)  Difficulty of Paying Living Expenses: Not hard at all  Food Insecurity: No Food Insecurity (01/22/2023)   Hunger Vital Sign    Worried About Running Out of Food in the Last Year: Never true    Ran Out of Food in the Last Year: Never true  Transportation Needs: No Transportation Needs (01/22/2023)   PRAPARE - Administrator, Civil Service (Medical): No    Lack of Transportation (Non-Medical): No  Physical Activity: Inactive (01/22/2023)   Exercise Vital Sign    Days of Exercise per Week: 0 days    Minutes of Exercise per Session: 0 min  Stress: No Stress Concern Present (01/22/2023)   [employer] of Occupational Health - Occupational Stress Questionnaire    Feeling of Stress :  Only a little  Social Connections: Moderately Isolated (01/22/2023)   Social Connection and Isolation Panel [NHANES]    Frequency of Communication with Friends and Family: More than three times a week    Frequency of Social Gatherings with Friends and Family: Once a week    Attends Religious Services: More than 4 times per year    Active Member of [employer] or Organizations: No    Attends Banker Meetings: Never    Marital Status: Widowed  Intimate Partner Violence: Not At Risk (01/22/2023)   Humiliation, Afraid, Rape, and Kick questionnaire    Fear of Current or Ex-Partner: No    Emotionally Abused: No    Physically Abused: No    Sexually Abused: No    Past Surgical History:  Procedure Laterality Date   CARDIAC CATHETERIZATION  05/07/2007   EF 55%, focal mild hypocontractility in mid-distal inferior wall & mid posterolateral wall; severe multivessel CAD - susequent CABGx6 (Dr. Bishop Limbo)   CORONARY ARTERY BYPASS GRAFT  05/09/2007   LIMA to LAD, veing to intermediate; SVG to OM1 & OM2; SVG to acute marginal & distal RCA (Dr. Dorris Fetch)   TRANSTHORACIC ECHOCARDIOGRAM  02/13/2010   EF =>55%, vigorous contraction EF 65%; LA mild-mod dilated; IV normal diameter - normal CVP; trace MR; mild TR; trace AV regurg    Family History  Problem Relation Age of Onset   Cancer Mother    Diabetes Sister    Cancer Child     No Known Allergies  Current Outpatient Medications on File Prior to Visit  Medication Sig Dispense Refill   Acetaminophen (TYLENOL 8 HOUR PO) Take 1 tablet by mouth every 8 (eight) hours as needed.      aspirin 81 MG tablet Take 81 mg by mouth daily.     Azelastine HCl 137 MCG/SPRAY SOLN As needed     Blood Glucose Monitoring Suppl (ACCU-CHEK GUIDE) w/Device KIT Use as instructed to check blood sugar. 1 kit 0   Cholecalciferol (VITAMIN D) 50 MCG (2000 UT) CAPS Take 1 capsule by mouth daily.     Continuous Glucose Receiver (FREESTYLE LIBRE 2 READER) DEVI Use to check  blood sugars. 1 each 0   Continuous Glucose Sensor (FREESTYLE LIBRE 2 SENSOR) MISC Apply every 14 days to check blood sugars. 6 each 1   Continuous Glucose Sensor (FREESTYLE LIBRE 3 PLUS SENSOR) MISC Change sensor every 15 days. 6 each 3   Continuous Glucose Sensor (FREESTYLE LIBRE 3 SENSOR) MISC APPLY SENSOR EVERY 14 DAYS TO CONTINOUSLY MONITOR GLUCOSE 6 each 1   cyclobenzaprine (FLEXERIL) 5 MG tablet TAKE 1 TABLET BY MOUTH THREE TIMES DAILY AS NEEDED FOR MUSCLE SPASM MAY CAUSE DROWSINESS 30 tablet 0   ezetimibe (  ZETIA) 10 MG tablet Take 1 tablet by mouth once daily 30 tablet 0   fexofenadine (ALLEGRA) 180 MG tablet Take 180 mg by mouth daily.     glucose blood (ACCU-CHEK GUIDE) test strip USE TO CHECK SUGAR THREE TIMES DAILY AS DIRECTED 250 each 3   Insulin Glargine-Lixisenatide (SOLIQUA) 100-33 UNT-MCG/ML SOPN Inject 22 Units into the skin daily. for diabetes. 30 mL 0   Insulin Pen Needle (BD PEN NEEDLE NANO 2ND GEN) 32G X 4 MM MISC USE 1 PEN NEEDLE WITH INSULIN PEN once daily 100 each 3   losartan (COZAAR) 100 MG tablet Take 1 tablet by mouth once daily 90 tablet 3   metoprolol succinate (TOPROL-XL) 50 MG 24 hr tablet TAKE 1 TABLET BY MOUTH IN THE MORNING AND AT BEDTIME TAKE  WITH  OR  IMMEDIATELY  FOLLOWING  A  MEAL 180 tablet 3   Multiple Vitamin (MULTIVITAMIN) capsule Take 1 capsule by mouth daily.     pantoprazole (PROTONIX) 40 MG tablet Take 1 tablet by mouth once daily 90 tablet 3   Polyethyl Glycol-Propyl Glycol (SYSTANE OP) Apply 2 drops to eye 2 (two) times daily.      rosuvastatin (CRESTOR) 10 MG tablet Take 1 tablet (10 mg total) by mouth daily. for cholesterol. 90 tablet 3   TRAVATAN Z 0.004 % SOLN ophthalmic solution Place 1 drop into both eyes at bedtime.     No current facility-administered medications on file prior to visit.    BP (!) 158/84   Pulse 62   Temp 98.7 F (37.1 C) (Temporal)   Ht 5\' 3"  (1.6 m)   Wt 125 lb (56.7 kg)   SpO2 98%   BMI 22.14 kg/m  Objective:    Physical Exam Cardiovascular:     Rate and Rhythm: Normal rate and regular rhythm.  Pulmonary:     Effort: Pulmonary effort is normal.     Breath sounds: Normal breath sounds.  Musculoskeletal:     Cervical back: Neck supple.  Skin:    General: Skin is warm and dry.  Neurological:     Mental Status: She is alert and oriented to person, place, and time.  Psychiatric:        Mood and Affect: Mood normal.           Assessment & Plan:  Type 2 diabetes mellitus with retinopathy, with long-term current use of insulin, macular edema presence unspecified, unspecified laterality, unspecified retinopathy severity (HCC) Assessment & Plan: Deteriorated with A1c of 8.3 today.  She seems to be caught in a vicious cycle of hypoglycemia in the morning followed by recurrent consumption of sweets with hyperglycemia in the afternoon.  Recommended she start injecting Soliqua 28 units in the morning to prevent next-day hypoglycemia. Strongly advised that she increase protein during the day to help prevent hypoglycemia.  Stop sugary foods/drinks.  Recommended that she contact us in 2 weeks with an update regarding her morning blood sugars.  Follow-up in 3 months.  Orders: -     POCT glycosylated hemoglobin (Hb A1C)  Anxiety and depression Assessment & Plan: Ongoing.  Strongly advised that she consider treatment with either therapy, medication, or both.  She declines therapy but is willing to try medication again.  Start Zoloft 25 mg daily.  Discussed potential side effects and instructions for use. Will monitor closely.  Orders: -     Sertraline HCl; Take 1 tablet (25 mg total) by mouth daily. for anxiety and depression.  Dispense: 90 tablet; Refill:  0        Doreene Nest, NP

## 2023-02-09 ENCOUNTER — Other Ambulatory Visit: Payer: Self-pay | Admitting: Cardiovascular Disease

## 2023-02-26 ENCOUNTER — Other Ambulatory Visit: Payer: Self-pay | Admitting: Primary Care

## 2023-02-26 ENCOUNTER — Telehealth: Payer: Self-pay | Admitting: Primary Care

## 2023-02-26 DIAGNOSIS — E11319 Type 2 diabetes mellitus with unspecified diabetic retinopathy without macular edema: Secondary | ICD-10-CM

## 2023-02-26 MED ORDER — SOLIQUA 100-33 UNT-MCG/ML ~~LOC~~ SOPN
28.0000 [IU] | PEN_INJECTOR | Freq: Every day | SUBCUTANEOUS | 0 refills | Status: DC
Start: 2023-02-26 — End: 2023-04-05

## 2023-02-26 NOTE — Telephone Encounter (Unsigned)
Copied from CRM [id number]. Topic: Clinical - Medication Refill >> Feb 26, 2023 10:17 AM Corin V wrote: Most Recent Primary Care Visit:  Provider: Doreene Nest  Department: [facility]  Visit Type: OFFICE VISIT  Date: 02/08/2023  Medication: Insulin Glargine-Lixisenatide (SOLIQUA) 100-33 UNT-MCG/ML SOPN  Has the patient contacted their pharmacy? Yes- unable to get through to the pharmacy (Agent: If no, request that the patient contact the pharmacy for the refill. If patient does not wish to contact the pharmacy document the reason why and proceed with request.) (Agent: If yes, when and what did the pharmacy advise?)  Is this the correct pharmacy for this prescription? Yes If no, delete pharmacy and type the correct one.  This is the patient's preferred pharmacy:  [facility] [address] [place] - [address] [place] [address] Phone: [phone] Fax: [phone]   Has the prescription been filled recently? No  Is the patient out of the medication? No- has one dose lest  Has the patient been seen for an appointment in the last year OR does the patient have an upcoming appointment? Yes  Can we respond through MyChart? No  Agent: Please be advised that Rx refills may take up to 3 business days. We ask that you follow-up with your pharmacy.

## 2023-02-26 NOTE — Telephone Encounter (Signed)
Called and spoke with patients daughters, Rene Kocher and Rosey Bath. They have some concern regarding their moms behavior. They stated they see the patient struggling with forgetfulness, uncertainty about certain things, headaches, easily agitated. Patient has told her daughters she is depressed, they are not sure she is taking the Zoloft. They report patient worried about her neighbors watching her. They report her having issues with hearing, they see her having the TV turned up very loud and they are concerned she is not fully understanding what is being said at appointments.   Patients daughters states they do not come to the doctors appointments because she will not let them come back to the exam room with her. They are worried there is something wrong but that the patient Is not telling them the full story of what the provider is saying. They stated the littlest thing either of them do will agitate her and they do not understand this behavior. They are asking to know the providers assessment of the patient.   The patients daughters are requesting to speak directly with Jae Dire regarding their concerns if she is able to accommodate.

## 2023-02-26 NOTE — Telephone Encounter (Signed)
Copied from CRM [id number]. Topic: General - Other >> Feb 26, 2023  1:54 PM Gurney Maxin H wrote: Reason for CRM: Patients daughter calling in to speak with provider regarding mom, states they're noticing some changes and want to speak with Natalia Leatherwood. If she's not available can talk with Aggie Cosier who is also DPR.  Rene Kocher [phone] Aggie Cosier [phone]

## 2023-02-26 NOTE — Telephone Encounter (Signed)
Refills sent to pharmacy.

## 2023-02-27 NOTE — Telephone Encounter (Signed)
Please call patient's daughter:  Please let them know that I am happy to speak with them regarding their concerns as I have some myself.  I can give them a call today (02/12) around 5:15 PM or this Friday between 4:30 PM and 5:30 PM.  Let me know what works best.

## 2023-02-27 NOTE — Telephone Encounter (Signed)
Called and spoke with patients daughters, Rene Kocher is unavailable at either of these times so she requested we ask Laura Ferguson. Spoke with Laura Ferguson she can do the 5:15 pm today.

## 2023-02-27 NOTE — Telephone Encounter (Addendum)
Spoke with patient's daughter, Aggie Cosier, via phone regarding family concerns. The conversation was largely around her declining memory, chronic anxiety, chronic depression.   We discussed the most recent office visit and that therapy and medication were offered. Her daughter mentions that she has not taken the Zoloft as of yet.   Her family monitors her daily, brings her food, had cameras installed her home for monitoring. Her neighbors are also watching out.   We both agreed for either she or Rene Kocher accompany her to her next office visit in late April 2025. They will schedule the appointment.   We will activate her MyChart portal, Aggie Cosier will start monitoring.

## 2023-03-11 DIAGNOSIS — H40003 Preglaucoma, unspecified, bilateral: Secondary | ICD-10-CM | POA: Diagnosis not present

## 2023-03-11 DIAGNOSIS — H401130 Primary open-angle glaucoma, bilateral, stage unspecified: Secondary | ICD-10-CM | POA: Diagnosis not present

## 2023-03-18 ENCOUNTER — Ambulatory Visit: Payer: Self-pay | Admitting: Primary Care

## 2023-03-18 ENCOUNTER — Encounter: Payer: Self-pay | Admitting: Internal Medicine

## 2023-03-18 ENCOUNTER — Ambulatory Visit (INDEPENDENT_AMBULATORY_CARE_PROVIDER_SITE_OTHER): Admitting: Internal Medicine

## 2023-03-18 VITALS — BP 148/70 | HR 58 | Temp 98.5°F | Ht 63.0 in | Wt 123.0 lb

## 2023-03-18 DIAGNOSIS — E11319 Type 2 diabetes mellitus with unspecified diabetic retinopathy without macular edema: Secondary | ICD-10-CM | POA: Diagnosis not present

## 2023-03-18 DIAGNOSIS — R42 Dizziness and giddiness: Secondary | ICD-10-CM | POA: Insufficient documentation

## 2023-03-18 DIAGNOSIS — Z794 Long term (current) use of insulin: Secondary | ICD-10-CM

## 2023-03-18 LAB — POCT GLUCOSE FINGERSTICK: Glucose: 293 — AB (ref 70–99)

## 2023-03-18 NOTE — Telephone Encounter (Signed)
 Daughter Rosey Bath had questions regarding mother's appointment today-will be accompanying her to appointment. RN advised Rosey Bath to make sure she brings her meter to calibrate. Rosey Bath stated she would like to speak with PCP if she is available today at time of appt for acute symptoms with Dr. Alphonsus Sias.    Copied from CRM [id number]. Topic: General - Other >> Mar 18, 2023  9:30 AM Isabell A wrote: Reason for CRM: Daughter - Rosey Bath calling to speak with triage nurse.  Callback number: [phone] Answer Assessment - Initial Assessment Questions 1. REASON FOR CALL or QUESTION: "What is your reason for calling today?" or "How can I best help you?" or "What question do you have that I can help answer?"     Information regarding mother's appointment-on DPR  Protocols used: Information Only Call - No Triage-A-AH

## 2023-03-18 NOTE — Assessment & Plan Note (Signed)
 Clearly vestibular Did well when Dr Andee Poles did a Dix-Hallpike maneuver Suggested she set up with him again

## 2023-03-18 NOTE — Patient Instructions (Signed)
 Please set up with Dr Andee Poles for the vertigo.

## 2023-03-18 NOTE — Telephone Encounter (Signed)
 Please call Rosey Bath and let her know that I should be finished seeing patients around 430.  Have her notify Carollee Herter CMA that she would like to speak with me after the visit.  We will try to coordinate.

## 2023-03-18 NOTE — Assessment & Plan Note (Addendum)
 As far as I can tell --her Laura Ferguson seems to be working okay (but she has the wrong dates in there)--but got 280 when we got 293 Is high every morning and is not compliant with her eating Is on the soliqua 28 units daily

## 2023-03-18 NOTE — Telephone Encounter (Signed)
 Called and spoke with Rosey Bath, advised of [employer]. [facility] CMA notified.

## 2023-03-18 NOTE — Telephone Encounter (Signed)
 Chief Complaint: Blood sugar low and high , back and forth for a long time Symptoms: dizziness, headaches Frequency: on and off "for a while" Pertinent Negatives: Patient denies pain, SOB Disposition: [] ED /[] Urgent Care (no appt availability in office) / [x] Appointment(In office/virtual)/ []  [facility]/ [] Home Care/ [] Refused Recommended Disposition /[] [facility]/ []  Follow-up with PCP Additional Notes: Patient has several concerns-she feels her glucose meter is not working, and her blood sugar has been out of control-sometimes up to 400-500 for "a while". She does endorse some dizziness that has been ongoing, denies any other symptoms. She would like to be seen today in order to test her meter and to also be assessed for current acute symptoms. Appt made with Dr Alphonsus Sias for acute symptoms at 4pm-patient advised she will have her grandson drive her. RN advised patient to call back or go to ED if symptoms become worse.   Copied from CRM [id number]. Topic: Clinical - Red Word Triage >> Mar 18, 2023  8:26 AM Orinda Kenner C wrote: Red Word that prompted transfer to Nurse Triage: Patient [phone] states needs a new glucose meter, the one she it not working. Patient states sugar level was low 62 last night. Blood sugar 164  this morning, patient has not eaten breakfast yet. Patient is having headaches, dizziness, hard to walk, weak, not really shortness of breath. Patient denies pain.  Patient is trying to manage her diabetes, but find it hard to do and wants to schedule an earlier time to see NP, Chestine Spore 04/30/23. Reason for Disposition  [1] Symptoms of high blood sugar (e.g., abnormally thirsty, frequent urination, weight loss) AND [2] not able to test blood glucose  Answer Assessment - Initial Assessment Questions 1. SYMPTOMS: "What symptoms are you concerned about?"     headaches, dizziness, hard to walk, weak, 2. ONSET:  "When did the symptoms start?"     "For a good while-it  seems it has worsened" 3. BLOOD GLUCOSE: "What is your blood glucose level?"      164 this morning, currently 162 4. USUAL RANGE: "What is your blood glucose level usually?" (e.g., usual fasting morning value, usual evening value)     Has been up to 400-500 this week 5. TYPE 1 or 2:  "Do you know what type of diabetes you have?"  (e.g., Type 1, Type 2, Gestational; doesn't know)      2 6. INSULIN: "Do you take insulin?" "What type of insulin(s) do you use? What is the mode of delivery? (syringe, pen; injection or pump) "When did you last give yourself an insulin dose?" (i.e., time or hours/minutes ago) "How much did you give?" (i.e., how many units)     Insulin 28 units daily 7. DIABETES PILLS: "Do you take any pills for your diabetes?" If Yes, ask: "What is the name of the medicine(s) that you take for high blood sugar?"     no 8. OTHER SYMPTOMS: "Do you have any symptoms?" (e.g., fever, frequent urination, difficulty breathing, vomiting)     Dizziness present today 9. LOW BLOOD GLUCOSE TREATMENT: "What have you done so far to treat the low blood glucose level?"     Not low currently 10. FOOD: "When did you last eat or drink?"       Havent eaten anything yet today-last ate "last evening" 11. ALONE: "Are you alone right now or is someone with you?"        alone 12. PREGNANCY: "Is there any chance you are  pregnant?" "When was your last menstrual period?"       no  Protocols used: Diabetes - Low Blood Sugar-A-AH, Diabetes - High Blood Sugar-A-AH

## 2023-03-18 NOTE — Progress Notes (Signed)
 Subjective:    Patient ID: Laura Ferguson, female    DOB: [birth year], 89 y.o.   MRN: [id number]  HPI Here due to dizziness With daughter  Lots of dizziness--"like something is pulling me back" Feels off balance Spinning at times Not clearly related to BP or the sugar variability Sometimes get dizziness turning in bed or looking up No sense that she will pass out Does take fexofenadine for allergies Not every day No falls  Does have Freestyle Libre---sugars generally quite high in AM  No chest pain of note No SOB  States she has started the sertraline--but daughter shakes her head no  Current Outpatient Medications on File Prior to Visit  Medication Sig Dispense Refill   Acetaminophen (TYLENOL 8 HOUR PO) Take 1 tablet by mouth every 8 (eight) hours as needed.      aspirin 81 MG tablet Take 81 mg by mouth daily.     Azelastine HCl 137 MCG/SPRAY SOLN As needed     Blood Glucose Monitoring Suppl (ACCU-CHEK GUIDE) w/Device KIT Use as instructed to check blood sugar. 1 kit 0   Cholecalciferol (VITAMIN D) 50 MCG (2000 UT) CAPS Take 1 capsule by mouth daily.     Continuous Glucose Sensor (FREESTYLE LIBRE 3 PLUS SENSOR) MISC Change sensor every 15 days. 6 each 3   Continuous Glucose Sensor (FREESTYLE LIBRE 3 SENSOR) MISC APPLY SENSOR EVERY 14 DAYS TO CONTINOUSLY MONITOR GLUCOSE 6 each 1   cyclobenzaprine (FLEXERIL) 5 MG tablet TAKE 1 TABLET BY MOUTH THREE TIMES DAILY AS NEEDED FOR MUSCLE SPASM MAY CAUSE DROWSINESS 30 tablet 0   ezetimibe (ZETIA) 10 MG tablet Take 1 tablet (10 mg total) by mouth daily. 90 tablet 2   fexofenadine (ALLEGRA) 180 MG tablet Take 180 mg by mouth daily.     glucose blood (ACCU-CHEK GUIDE) test strip USE TO CHECK SUGAR THREE TIMES DAILY AS DIRECTED 250 each 3   Insulin Glargine-Lixisenatide (SOLIQUA) 100-33 UNT-MCG/ML SOPN Inject 28 Units into the skin daily. for diabetes. 45 mL 0   Insulin Pen Needle (BD PEN NEEDLE NANO 2ND GEN) 32G X 4 MM MISC USE 1 PEN NEEDLE  WITH INSULIN PEN once daily 100 each 3   losartan (COZAAR) 100 MG tablet Take 1 tablet by mouth once daily 90 tablet 3   metoprolol succinate (TOPROL-XL) 50 MG 24 hr tablet TAKE 1 TABLET BY MOUTH IN THE MORNING AND AT BEDTIME TAKE  WITH  OR  IMMEDIATELY  FOLLOWING  A  MEAL 180 tablet 3   Multiple Vitamin (MULTIVITAMIN) capsule Take 1 capsule by mouth daily.     pantoprazole (PROTONIX) 40 MG tablet Take 1 tablet by mouth once daily 90 tablet 3   Polyethyl Glycol-Propyl Glycol (SYSTANE OP) Apply 2 drops to eye 2 (two) times daily.      rosuvastatin (CRESTOR) 10 MG tablet Take 1 tablet (10 mg total) by mouth daily. for cholesterol. 90 tablet 3   sertraline (ZOLOFT) 25 MG tablet Take 1 tablet (25 mg total) by mouth daily. for anxiety and depression. 90 tablet 0   TRAVATAN Z 0.004 % SOLN ophthalmic solution Place 1 drop into both eyes at bedtime.     No current facility-administered medications on file prior to visit.    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 Bishop Limbo)   CORONARY ARTERY BYPASS GRAFT  05/09/2007   LIMA to LAD, veing to intermediate; SVG to OM1 & OM2; SVG to acute marginal & distal RCA (Dr. Dorris Fetch)   TRANSTHORACIC ECHOCARDIOGRAM   02/13/2010   EF =>55%, vigorous contraction EF 65%; LA mild-mod dilated; IV normal diameter - normal CVP; trace MR; mild TR; trace AV regurg    Family History  Problem Relation Age of Onset   Cancer Mother    Diabetes Sister    Cancer Child     Social History   Socioeconomic History   Marital status: Widowed    Spouse name: Not on file   Number of children: 7   Years of education: Not on file   Highest education level: Not on file  Occupational History   Not on file  Tobacco Use   Smoking status: Never   Smokeless tobacco: Never   Tobacco comments:    quit 1960's  smoked very lightly.  Substance and Sexual Activity   Alcohol use: No    Alcohol/week: 0.0 standard drinks of alcohol   Drug use: No   Sexual activity: Not on file  Other Topics Concern   Not on file  Social History Narrative   Married.   Retired.   Works as a Engineer, structural.    Enjoys helping other.    Social Drivers of Corporate investment banker Strain: Low Risk  (01/22/2023)   Overall Financial Resource Strain (CARDIA)    Difficulty of Paying Living Expenses: Not hard at all  Food Insecurity: No Food Insecurity (01/22/2023)   Hunger Vital Sign    Worried About Running Out of Food in the Last Year: Never true    Ran Out of Food in the Last Year: Never true  Transportation Needs: No Transportation Needs (01/22/2023)   PRAPARE - Administrator, Civil Service (Medical): No    Lack of Transportation (Non-Medical): No  Physical Activity: Inactive (01/22/2023)   Exercise Vital Sign    Days of Exercise per Week: 0 days    Minutes of Exercise per Session: 0 min  Stress: No Stress Concern Present (01/22/2023)   [employer] of Occupational Health - Occupational Stress Questionnaire    Feeling of Stress : Only a little  Social Connections: Moderately Isolated (01/22/2023)   Social Connection and Isolation Panel [NHANES]    Frequency of Communication with Friends and Family: More than three times a week     Frequency of Social Gatherings with Friends and Family: Once a week    Attends Religious Services: More than 4 times per year    Active Member of [employer] or Organizations: No    Attends Banker Meetings: Never    Marital Status: Widowed  Intimate Partner Violence: Not At Risk (01/22/2023)   Humiliation, Afraid, Rape, and Kick questionnaire    Fear  of Current or Ex-Partner: No    Emotionally Abused: No    Physically Abused: No    Sexually Abused: No   Review of Systems Gets ringing.  Hearing aides---not wearing now Notes a lot of stress    Objective:   Physical Exam Constitutional:      Appearance: Normal appearance.  Cardiovascular:     Rate and Rhythm: Normal rate and regular rhythm.     Heart sounds: No murmur heard.    No gallop.  Pulmonary:     Effort: Pulmonary effort is normal.     Breath sounds: Normal breath sounds. No wheezing or rales.  Musculoskeletal:     Cervical back: Neck supple.  Lymphadenopathy:     Cervical: No cervical adenopathy.  Neurological:     Mental Status: She is alert and oriented to person, place, and time.     Cranial Nerves: Cranial nerves 2-12 are intact.     Coordination: Romberg sign negative.     Comments: Antalgic gait ---can't extend right knee fully No ataxia             Assessment & Plan:

## 2023-03-18 NOTE — Telephone Encounter (Signed)
 Rich, I have spoken with the patient's daughters regarding concerns for dementia.  She has a personal history of noncompliance and does not follow instructions for Soliqua.  Just FYI.  See phone note dated 02/27/2023.

## 2023-03-19 DIAGNOSIS — H35033 Hypertensive retinopathy, bilateral: Secondary | ICD-10-CM | POA: Diagnosis not present

## 2023-03-19 DIAGNOSIS — H35373 Puckering of macula, bilateral: Secondary | ICD-10-CM | POA: Diagnosis not present

## 2023-03-19 DIAGNOSIS — E113311 Type 2 diabetes mellitus with moderate nonproliferative diabetic retinopathy with macular edema, right eye: Secondary | ICD-10-CM | POA: Diagnosis not present

## 2023-03-19 DIAGNOSIS — H43813 Vitreous degeneration, bilateral: Secondary | ICD-10-CM | POA: Diagnosis not present

## 2023-03-19 DIAGNOSIS — H401132 Primary open-angle glaucoma, bilateral, moderate stage: Secondary | ICD-10-CM | POA: Diagnosis not present

## 2023-03-22 DIAGNOSIS — R42 Dizziness and giddiness: Secondary | ICD-10-CM | POA: Diagnosis not present

## 2023-03-28 DIAGNOSIS — E113313 Type 2 diabetes mellitus with moderate nonproliferative diabetic retinopathy with macular edema, bilateral: Secondary | ICD-10-CM | POA: Diagnosis not present

## 2023-03-28 DIAGNOSIS — Z961 Presence of intraocular lens: Secondary | ICD-10-CM | POA: Diagnosis not present

## 2023-03-28 DIAGNOSIS — H401131 Primary open-angle glaucoma, bilateral, mild stage: Secondary | ICD-10-CM | POA: Diagnosis not present

## 2023-03-28 DIAGNOSIS — Z9889 Other specified postprocedural states: Secondary | ICD-10-CM | POA: Diagnosis not present

## 2023-04-05 ENCOUNTER — Ambulatory Visit (INDEPENDENT_AMBULATORY_CARE_PROVIDER_SITE_OTHER): Admitting: Primary Care

## 2023-04-05 ENCOUNTER — Encounter: Payer: Self-pay | Admitting: Primary Care

## 2023-04-05 VITALS — BP 148/90 | HR 45 | Temp 97.7°F | Ht 63.0 in | Wt 124.1 lb

## 2023-04-05 DIAGNOSIS — F419 Anxiety disorder, unspecified: Secondary | ICD-10-CM

## 2023-04-05 DIAGNOSIS — Z794 Long term (current) use of insulin: Secondary | ICD-10-CM | POA: Diagnosis not present

## 2023-04-05 DIAGNOSIS — E11319 Type 2 diabetes mellitus with unspecified diabetic retinopathy without macular edema: Secondary | ICD-10-CM

## 2023-04-05 DIAGNOSIS — F32A Depression, unspecified: Secondary | ICD-10-CM

## 2023-04-05 MED ORDER — LANTUS SOLOSTAR 100 UNIT/ML ~~LOC~~ SOPN
15.0000 [IU] | PEN_INJECTOR | Freq: Every day | SUBCUTANEOUS | 0 refills | Status: DC
Start: 2023-04-05 — End: 2023-05-09

## 2023-04-05 NOTE — Patient Instructions (Addendum)
 Stop taking insulin glargine-lixisenatide Laura Ferguson) medication for diabetes.   Start taking insulin glargine (Lantus) medication for diabetes. Inject 15 units into the skin every morning.   Schedule a follow up visit in 1 month for diabetes.  It was a pleasure to see you today!

## 2023-04-05 NOTE — Assessment & Plan Note (Addendum)
 Mood appears much improved today, although she is a poor historian and do suspect underlying memory changes. Unclear if she is taking sertraline. Will contact patient's daughter.  Continue to closely monitor.

## 2023-04-05 NOTE — Assessment & Plan Note (Signed)
 Changing regimen today given her bouts of hypoglycemia and age.   Stop Soliqua.  Start Lantus 15 units daily.   Continue to monitor blood glucose levels. Will contact patient's daughter to discuss changes in regimen.   Close follow up in 1 month.

## 2023-04-05 NOTE — Progress Notes (Signed)
 Subjective:    Patient ID: Laura Ferguson, female    DOB: 1932-08-30, 89 y.o.   MRN: [id number]  HPI  Laura Ferguson is a very pleasant 89 y.o. female with a history of uncontrolled type 2 diabetes, hypertension, hyperlipidemia, CAD, anxiety and depression, chronic back pain who presents today for follow up of diabetes and hyperglycemia.   She is alone today.    Current medications include: Soliqua 28 units daily. She increased her Soliqua to 30 units daily about 1 week ago, due to "high" readings.   She checks her glucose levels continuously which runs:  AM fasting: 60s Before Dinner: 200s  She is overall feeling well today. Denies concerns.    Last A1C: 7.9 in September 2024, 8.3 in January 2025 Last Eye Exam: Due Last Foot Exam: UTD Pneumonia Vaccination: UTD Urine Microalbumin: UTD Statin: rosuvastatin   Diet currently consists of:  Breakfast: Cereal, eggs, tomatoes  Lunch: Chicken nuggets, sandwiches, crackers Dinner: Home cooked meals from family  Snacks: Cookies Beverages: Ginger Ale, water   Exercise: Active with exercise bike at home   BP Readings from Last 3 Encounters:  04/05/23 (!) 148/90  03/18/23 (!) 148/70  02/08/23 (!) 158/84      Review of Systems  Respiratory:  Negative for shortness of breath.   Cardiovascular:  Negative for chest pain.  Neurological:  Negative for dizziness.  Psychiatric/Behavioral:  The patient is not nervous/anxious.          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  Substance and Sexual Activity   Alcohol use: No    Alcohol/week: 0.0 standard drinks of alcohol   Drug use: No   Sexual activity: Not on file  Other Topics Concern   Not on file  Social History Narrative   Married.   Retired.   Works as a Engineer, structural.    Enjoys helping other.    Social Drivers of Corporate investment banker Strain: Low Risk  (01/22/2023)   Overall Financial Resource Strain (CARDIA)    Difficulty of Paying Living Expenses: Not hard at all  Food Insecurity: No Food Insecurity (01/22/2023)   Hunger Vital Sign    Worried About Running Out of Food in the Last Year: Never true    Ran Out of Food in the Last Year: Never true  Transportation Needs: No Transportation Needs (01/22/2023)   PRAPARE - Administrator, Civil Service (Medical): No  Lack of Transportation (Non-Medical): No  Physical Activity: Inactive (01/22/2023)   Exercise Vital Sign    Days of Exercise per Week: 0 days    Minutes of Exercise per Session: 0 min  Stress: No Stress Concern Present (01/22/2023)   [employer] of Occupational Health - Occupational Stress Questionnaire    Feeling of Stress : Only a little  Social Connections: Moderately Isolated (01/22/2023)   Social Connection and Isolation Panel [NHANES]    Frequency of Communication with Friends and Family: More than three times a week    Frequency of Social Gatherings with Friends and Family: Once a week    Attends Religious Services: More than 4 times per year    Active Member of [employer] or Organizations: No    Attends Tax inspector Meetings: Never    Marital Status: Widowed  Intimate Partner Violence: Not At Risk (01/22/2023)   Humiliation, Afraid, Rape, and Kick questionnaire    Fear of Current or Ex-Partner: No    Emotionally Abused: No    Physically Abused: No    Sexually Abused: No    Past Surgical History:  Procedure Laterality Date   CARDIAC CATHETERIZATION  05/07/2007   EF 55%, focal mild hypocontractility in mid-distal inferior wall & mid posterolateral wall; severe multivessel CAD - susequent CABGx6 (Dr. Bishop Limbo)   CORONARY ARTERY BYPASS GRAFT  05/09/2007   LIMA to LAD, veing to intermediate; SVG to OM1 & OM2; SVG to acute marginal & distal RCA (Dr. Dorris Fetch)   TRANSTHORACIC ECHOCARDIOGRAM  02/13/2010   EF =>55%, vigorous contraction EF 65%; LA mild-mod dilated; IV normal diameter - normal CVP; trace MR; mild TR; trace AV regurg    Family History  Problem Relation Age of Onset   Cancer Mother    Diabetes Sister    Cancer Child     No Known Allergies  Current Outpatient Medications on File Prior to Visit  Medication Sig Dispense Refill   Acetaminophen (TYLENOL 8 HOUR PO) Take 1 tablet by mouth every 8 (eight) hours as needed.      aspirin 81 MG tablet Take 81 mg by mouth daily.     Azelastine HCl 137 MCG/SPRAY SOLN As needed     Blood Glucose Monitoring Suppl (ACCU-CHEK GUIDE) w/Device KIT Use as instructed to check blood sugar. 1 kit 0   Cholecalciferol (VITAMIN D) 50 MCG (2000 UT) CAPS Take 1 capsule by mouth daily.     Continuous Glucose Sensor (FREESTYLE LIBRE 3 PLUS SENSOR) MISC Change sensor every 15 days. 6 each 3   Continuous Glucose Sensor (FREESTYLE LIBRE 3 SENSOR) MISC APPLY SENSOR EVERY 14 DAYS TO CONTINOUSLY MONITOR GLUCOSE 6 each 1   cyclobenzaprine (FLEXERIL) 5 MG tablet TAKE 1 TABLET BY MOUTH THREE TIMES DAILY AS NEEDED FOR MUSCLE SPASM MAY CAUSE DROWSINESS 30 tablet 0   ezetimibe (ZETIA) 10 MG tablet Take 1 tablet (10 mg total) by mouth daily. 90 tablet 2    fexofenadine (ALLEGRA) 180 MG tablet Take 180 mg by mouth daily.     glucose blood (ACCU-CHEK GUIDE) test strip USE TO CHECK SUGAR THREE TIMES DAILY AS DIRECTED 250 each 3   Insulin Pen Needle (BD PEN NEEDLE NANO 2ND GEN) 32G X 4 MM MISC USE 1 PEN NEEDLE WITH INSULIN PEN once daily 100 each 3   losartan (COZAAR) 100 MG tablet Take 1 tablet by mouth once daily 90 tablet 3   metoprolol succinate (TOPROL-XL) 50 MG 24 hr tablet TAKE 1 TABLET BY MOUTH  IN THE MORNING AND AT BEDTIME TAKE  WITH  OR  IMMEDIATELY  FOLLOWING  A  MEAL 180 tablet 3   Multiple Vitamin (MULTIVITAMIN) capsule Take 1 capsule by mouth daily.     pantoprazole (PROTONIX) 40 MG tablet Take 1 tablet by mouth once daily 90 tablet 3   Polyethyl Glycol-Propyl Glycol (SYSTANE OP) Apply 2 drops to eye 2 (two) times daily.      rosuvastatin (CRESTOR) 10 MG tablet Take 1 tablet (10 mg total) by mouth daily. for cholesterol. 90 tablet 3   sertraline (ZOLOFT) 25 MG tablet Take 1 tablet (25 mg total) by mouth daily. for anxiety and depression. 90 tablet 0   TRAVATAN Z 0.004 % SOLN ophthalmic solution Place 1 drop into both eyes at bedtime.     No current facility-administered medications on file prior to visit.    BP (!) 148/90 (BP Location: Left Arm, Patient Position: Sitting, Cuff Size: Normal)   Pulse (!) 45   Temp 97.7 F (36.5 C) (Oral)   Ht 5\' 3"  (1.6 m)   Wt 124 lb 2 oz (56.3 kg)   SpO2 96%   BMI 21.99 kg/m  Objective:   Physical Exam Cardiovascular:     Rate and Rhythm: Normal rate and regular rhythm.  Pulmonary:     Effort: Pulmonary effort is normal.     Breath sounds: Normal breath sounds.  Musculoskeletal:     Cervical back: Neck supple.  Skin:    General: Skin is warm and dry.  Neurological:     Mental Status: She is alert and oriented to person, place, and time.  Psychiatric:        Mood and Affect: Mood normal.     Comments: Smiling. Mood much improved.            Assessment & Plan:  Type 2 diabetes  mellitus with retinopathy, with long-term current use of insulin, macular edema presence unspecified, unspecified laterality, unspecified retinopathy severity (HCC) Assessment & Plan: Changing regimen today given her bouts of hypoglycemia and age.   Stop Soliqua.  Start Lantus 15 units daily.   Continue to monitor blood glucose levels. Will contact patient's daughter to discuss changes in regimen.   Close follow up in 1 month.   Orders: -     Lantus SoloStar; Inject 15 Units into the skin daily. for diabetes.  Dispense: 15 mL; Refill: 0  Anxiety and depression Assessment & Plan: Mood appears much improved today, although she is a poor historian and do suspect underlying memory changes. Unclear if she is taking sertraline. Will contact patient's daughter.  Continue to closely monitor.          Doreene Nest, NP

## 2023-04-13 ENCOUNTER — Other Ambulatory Visit: Payer: Self-pay | Admitting: Cardiovascular Disease

## 2023-04-13 DIAGNOSIS — E785 Hyperlipidemia, unspecified: Secondary | ICD-10-CM

## 2023-04-15 ENCOUNTER — Telehealth: Payer: Self-pay | Admitting: Cardiovascular Disease

## 2023-04-15 DIAGNOSIS — I10 Essential (primary) hypertension: Secondary | ICD-10-CM

## 2023-04-15 DIAGNOSIS — E785 Hyperlipidemia, unspecified: Secondary | ICD-10-CM

## 2023-04-15 MED ORDER — LOSARTAN POTASSIUM 100 MG PO TABS: 100.0000 mg | ORAL_TABLET | Freq: Every day | ORAL | 1 refills | Status: AC

## 2023-04-15 MED ORDER — ROSUVASTATIN CALCIUM 10 MG PO TABS: 10.0000 mg | ORAL_TABLET | Freq: Every day | ORAL | 1 refills | Status: AC

## 2023-04-15 NOTE — Telephone Encounter (Signed)
*  STAT* If patient is at the pharmacy, call can be transferred to refill team.   1. Which medications need to be refilled? (please list name of each medication and dose if known)   losartan (COZAAR) 100 MG tablet   rosuvastatin (CRESTOR) 10 MG tablet  2. Which pharmacy/location (including street and city if local pharmacy) is medication to be sent to? [facility] [address] [place], [place] - [address] Phone: [phone]  Fax: [phone]     3. Do they need a 30 day or 90 day supply? 90

## 2023-04-15 NOTE — Telephone Encounter (Signed)
 Pt's medications were sent to pt's pharmacy as requested. Confirmation received.

## 2023-04-16 ENCOUNTER — Telehealth: Payer: Self-pay | Admitting: Primary Care

## 2023-04-16 NOTE — Telephone Encounter (Signed)
 Rosey Bath notified of [employer]. She will get HR to fax paperwork.

## 2023-04-16 NOTE — Telephone Encounter (Signed)
 Copied from CRM [id number]. Topic: General - Inquiry >> Apr 16, 2023  8:45 AM [place] wrote: Reason for CRM: patient daughter Laura Ferguson looking to get FMLA papers filled out so she can come to the appointments with Laura Ferguson  (mother) [phone]

## 2023-04-16 NOTE — Telephone Encounter (Signed)
 Happy to complete. Have Rosey Bath speak with her HR department and have them fax [place] the paperwork.

## 2023-04-26 ENCOUNTER — Telehealth: Payer: Self-pay | Admitting: Primary Care

## 2023-04-26 NOTE — Telephone Encounter (Signed)
 Copied from CRM [id number]. Topic: General - Other >> Apr 26, 2023 11:36 AM Nyra Capes wrote: Reason for CRM: patient daughter Rosey Bath calling in asking if FMLA papers have been sighed and faxed.    Rosey Bath phone  [phone]  ok to leave detailed message.

## 2023-04-26 NOTE — Telephone Encounter (Signed)
 Left message advising patient we have not received any FMLA paperwork from her employer yet. Provided fax number for them to fax to the office.

## 2023-04-29 ENCOUNTER — Telehealth: Payer: Self-pay | Admitting: Primary Care

## 2023-04-29 NOTE — Telephone Encounter (Signed)
 Copied from CRM [id number]. Topic: General - Call Back - No Documentation >> Apr 29, 2023  3:10 PM Freya Jesus wrote: Reason for CRM: Patient daughter is calling to speak with the nurse regarding patient FMLA paperwork that was faxed to the clinic today. She's requesting a call back as soon as possible. Phone: [phone].

## 2023-04-30 ENCOUNTER — Ambulatory Visit: Admitting: Primary Care

## 2023-04-30 DIAGNOSIS — H43813 Vitreous degeneration, bilateral: Secondary | ICD-10-CM | POA: Diagnosis not present

## 2023-04-30 DIAGNOSIS — E113312 Type 2 diabetes mellitus with moderate nonproliferative diabetic retinopathy with macular edema, left eye: Secondary | ICD-10-CM | POA: Diagnosis not present

## 2023-04-30 DIAGNOSIS — Z961 Presence of intraocular lens: Secondary | ICD-10-CM | POA: Diagnosis not present

## 2023-04-30 DIAGNOSIS — H35373 Puckering of macula, bilateral: Secondary | ICD-10-CM | POA: Diagnosis not present

## 2023-04-30 DIAGNOSIS — H401132 Primary open-angle glaucoma, bilateral, moderate stage: Secondary | ICD-10-CM | POA: Diagnosis not present

## 2023-04-30 DIAGNOSIS — H35033 Hypertensive retinopathy, bilateral: Secondary | ICD-10-CM | POA: Diagnosis not present

## 2023-04-30 NOTE — Telephone Encounter (Signed)
 FMLA for care taker forms received for completion for patient. Patient has been informed that process may take up to 5 business days.  Employer Name [employer] Reason for being out: Caregiver for parent Any inpatient care: Any Planned appointment?  Patient is requesting start date of 4.20.25 Patient is requesting end date of 4.20.26  Or patient is requesting duration of   4hrs  with 2 episodes every 1week.   Verified with patient that it is ok to leave Voicemail updates on  Mobile [phone] (mobile)  Patient would like to have mailed to home address on file when form is completed.   Fax number form should be sent to is [phone]  FMLA forms placed on providers desk for review.  Aaron Aas

## 2023-04-30 NOTE — Telephone Encounter (Addendum)
 Completed forms and placed on Laura Ferguson's desk.

## 2023-04-30 NOTE — Telephone Encounter (Signed)
 Left message to return call to office. Need to follow up regarding FMLA/disability forms.

## 2023-05-01 NOTE — Telephone Encounter (Signed)
 Completed FMLA forms have been faxed. I reached out to the patients daughter and told her I put a copy of the forms for her in the mail with the address on file. A copy of the forms have been sent to scan.

## 2023-05-06 ENCOUNTER — Telehealth: Payer: Self-pay

## 2023-05-06 NOTE — Telephone Encounter (Signed)
 [email or web address]

## 2023-05-09 ENCOUNTER — Ambulatory Visit (INDEPENDENT_AMBULATORY_CARE_PROVIDER_SITE_OTHER): Admitting: Primary Care

## 2023-05-09 ENCOUNTER — Encounter: Payer: Self-pay | Admitting: Primary Care

## 2023-05-09 VITALS — BP 152/76 | HR 63 | Temp 97.6°F | Ht 63.0 in | Wt 120.4 lb

## 2023-05-09 DIAGNOSIS — R413 Other amnesia: Secondary | ICD-10-CM

## 2023-05-09 DIAGNOSIS — M545 Low back pain, unspecified: Secondary | ICD-10-CM

## 2023-05-09 DIAGNOSIS — Z794 Long term (current) use of insulin: Secondary | ICD-10-CM

## 2023-05-09 DIAGNOSIS — E11319 Type 2 diabetes mellitus with unspecified diabetic retinopathy without macular edema: Secondary | ICD-10-CM | POA: Diagnosis not present

## 2023-05-09 DIAGNOSIS — G8929 Other chronic pain: Secondary | ICD-10-CM | POA: Diagnosis not present

## 2023-05-09 LAB — POCT GLYCOSYLATED HEMOGLOBIN (HGB A1C): Hemoglobin A1C: 9.4 % — AB (ref 4.0–5.6)

## 2023-05-09 MED ORDER — LANTUS SOLOSTAR 100 UNIT/ML ~~LOC~~ SOPN: 20.0000 [IU] | PEN_INJECTOR | Freq: Every day | SUBCUTANEOUS | Status: DC

## 2023-05-09 MED ORDER — CYCLOBENZAPRINE HCL 5 MG PO TABS: 5.0000 mg | ORAL_TABLET | Freq: Every evening | ORAL | 0 refills | Status: DC | PRN

## 2023-05-09 NOTE — Patient Instructions (Addendum)
 We increased your insulin  to 20 units daily.   Please call me with blood sugar readings in 2 weeks.   You will receive a phone call from the nurse case manager and the foot doctor.  Please schedule a follow up visit for 3 months.  It was a pleasure to see you today!

## 2023-05-09 NOTE — Assessment & Plan Note (Addendum)
 Uncontrolled with A1c of 9.4 today.  Increase Lantus  to 20 units.  Foot exam today.  Referral placed for nurse case manager given memory changes and uncontrolled glucose readings.  Referral placed for podiatry for footcare.  Family will closely watch glucose levels and update in a few weeks.   Follow up in 3 months.

## 2023-05-09 NOTE — Progress Notes (Signed)
 Subjective:    Patient ID: Laura Ferguson, female    DOB: 1932/08/31, 89 y.o.   MRN: [id number]  HPI  Laura Ferguson is a very pleasant 89 y.o. female with a history of type 2 diabetes, CAD, hypertension, hyperlipidemia, anxiety who presents today for follow-up of diabetes.  She is also requesting referral to podiatry for toenail trimming.   Both of her daughters joined us  today to help with HPI given concerns of memory changes. She lives alone. Daughters are concerned about her memory and living alone.   Current medications include: Lantus  15 units nightly  She is checking her blood glucose continuously and is getting readings of:  AM fasting: 120-150 Before Dinner: 130s, sometimes 300s. Bedtime: 200s  Last A1C: 8.3 in January 2025, 9.4 today. Last Eye Exam: Due Last Foot Exam: UTD Pneumonia Vaccination: 2021 Urine Microalbumin: UTD Statin: Rosuvastatin   Dietary changes since last visit: Home cooked meals.    Exercise: Walking some.   BP Readings from Last 3 Encounters:  05/09/23 (!) 152/76  04/05/23 (!) 148/90  03/18/23 (!) 148/70       Review of Systems  Respiratory:  Negative for shortness of breath.   Cardiovascular:  Negative for chest pain.  Neurological:  Negative for dizziness and numbness.         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  Substance and Sexual Activity   Alcohol use: No    Alcohol/week: 0.0 standard drinks of alcohol   Drug use: No   Sexual activity: Not on file  Other Topics Concern   Not on file  Social History Narrative   Married.   Retired.   Works as a Engineer, structural.    Enjoys helping other.    Social Drivers of Corporate investment banker Strain: Low Risk  (01/22/2023)   Overall Financial Resource Strain (CARDIA)    Difficulty of Paying Living Expenses: Not hard at all  Food Insecurity: No Food Insecurity (01/22/2023)   Hunger Vital Sign    Worried About Running Out of Food in the Last Year: Never true    Ran Out of Food in the Last Year: Never true  Transportation Needs: No Transportation Needs (01/22/2023)   PRAPARE - Administrator, Civil Service (Medical): No    Lack of Transportation (Non-Medical): No  Physical Activity: Inactive (01/22/2023)   Exercise Vital Sign    Days of Exercise per Week: 0 days  Minutes of Exercise per Session: 0 min  Stress: No Stress Concern Present (01/22/2023)   [employer] of Occupational Health - Occupational Stress Questionnaire    Feeling of Stress : Only a little  Social Connections: Moderately Isolated (01/22/2023)   Social Connection and Isolation Panel [NHANES]    Frequency of Communication with Friends and Family: More than three times a week    Frequency of Social Gatherings with Friends and Family: Once a week    Attends Religious Services: More than 4 times per year    Active Member of [employer] or Organizations: No    Attends Banker Meetings: Never    Marital Status: Widowed  Intimate Partner Violence: Not At Risk (01/22/2023)   Humiliation, Afraid, Rape, and Kick questionnaire     Fear of Current or Ex-Partner: No    Emotionally Abused: No    Physically Abused: No    Sexually Abused: No    Past Surgical History:  Procedure Laterality Date   CARDIAC CATHETERIZATION  05/07/2007   EF 55%, focal mild hypocontractility in mid-distal inferior wall & mid posterolateral wall; severe multivessel CAD - susequent CABGx6 (Dr. Electa Grieve)   CORONARY ARTERY BYPASS GRAFT  05/09/2007   LIMA to LAD, veing to intermediate; SVG to OM1 & OM2; SVG to acute marginal & distal RCA (Dr. Luna Salinas)   TRANSTHORACIC ECHOCARDIOGRAM  02/13/2010   EF =>55%, vigorous contraction EF 65%; LA mild-mod dilated; IV normal diameter - normal CVP; trace MR; mild TR; trace AV regurg    Family History  Problem Relation Age of Onset   Cancer Mother    Diabetes Sister    Cancer Child     No Known Allergies  Current Outpatient Medications on File Prior to Visit  Medication Sig Dispense Refill   Acetaminophen (TYLENOL 8 HOUR PO) Take 1 tablet by mouth every 8 (eight) hours as needed.      aspirin 81 MG tablet Take 81 mg by mouth daily.     Azelastine HCl 137 MCG/SPRAY SOLN As needed     Blood Glucose Monitoring Suppl (ACCU-CHEK GUIDE) w/Device KIT Use as instructed to check blood sugar. 1 kit 0   Cholecalciferol (VITAMIN D) 50 MCG (2000 UT) CAPS Take 1 capsule by mouth daily.     Continuous Glucose Sensor (FREESTYLE LIBRE 3 PLUS SENSOR) MISC Change sensor every 15 days. 6 each 3   Continuous Glucose Sensor (FREESTYLE LIBRE 3 SENSOR) MISC APPLY SENSOR EVERY 14 DAYS TO CONTINOUSLY MONITOR GLUCOSE 6 each 1   ezetimibe  (ZETIA ) 10 MG tablet Take 1 tablet (10 mg total) by mouth daily. 90 tablet 2   fexofenadine (ALLEGRA) 180 MG tablet Take 180 mg by mouth daily.     glucose blood (ACCU-CHEK GUIDE) test strip USE TO CHECK SUGAR THREE TIMES DAILY AS DIRECTED 250 each 3   Insulin  Pen Needle (BD PEN NEEDLE NANO 2ND GEN) 32G X 4 MM MISC USE 1 PEN NEEDLE WITH INSULIN  PEN once daily 100 each 3   losartan  (COZAAR )  100 MG tablet Take 1 tablet (100 mg total) by mouth daily. 90 tablet 1   metoprolol  succinate (TOPROL -XL) 50 MG 24 hr tablet TAKE 1 TABLET BY MOUTH IN THE MORNING AND AT BEDTIME TAKE  WITH  OR  IMMEDIATELY  FOLLOWING  A  MEAL 180 tablet 3   Multiple Vitamin (MULTIVITAMIN) capsule Take 1 capsule by mouth daily.     pantoprazole  (PROTONIX ) 40 MG tablet Take 1 tablet by mouth once daily 90 tablet  3   Polyethyl Glycol-Propyl Glycol (SYSTANE OP) Apply 2 drops to eye 2 (two) times daily.      rosuvastatin  (CRESTOR ) 10 MG tablet Take 1 tablet (10 mg total) by mouth daily. for cholesterol. 90 tablet 1   sertraline  (ZOLOFT ) 25 MG tablet Take 1 tablet (25 mg total) by mouth daily. for anxiety and depression. 90 tablet 0   TRAVATAN Z 0.004 % SOLN ophthalmic solution Place 1 drop into both eyes at bedtime.     No current facility-administered medications on file prior to visit.    BP (!) 152/76   Pulse 63   Temp 97.6 F (36.4 C) (Oral)   Ht 5\' 3"  (1.6 m)   Wt 120 lb 6 oz (54.6 kg)   SpO2 98%   BMI 21.32 kg/m  Objective:   Physical Exam Cardiovascular:     Rate and Rhythm: Normal rate and regular rhythm.  Pulmonary:     Effort: Pulmonary effort is normal.     Breath sounds: Normal breath sounds.  Musculoskeletal:     Cervical back: Neck supple.  Skin:    General: Skin is warm and dry.  Neurological:     Mental Status: She is alert and oriented to person, place, and time.  Psychiatric:        Mood and Affect: Mood normal.           Assessment & Plan:  Type 2 diabetes mellitus with retinopathy, with long-term current use of insulin , macular edema presence unspecified, unspecified laterality, unspecified retinopathy severity (HCC) Assessment & Plan: Uncontrolled with A1c of 9.4 today.  Increase Lantus  to 20 units.  Foot exam today.  Referral placed for nurse case manager given memory changes and uncontrolled glucose readings.  Referral placed for podiatry for footcare.  Family  will closely watch glucose levels and update in a few weeks.   Follow up in 3 months.  Orders: -     POCT glycosylated hemoglobin (Hb A1C) -     Lantus  SoloStar; Inject 20 Units into the skin daily. for diabetes. -     Ambulatory referral to Podiatry -     AMB Referral [facility] Care Management  Chronic bilateral low back pain without sciatica -     Cyclobenzaprine  HCl; Take 1 tablet (5 mg total) by mouth at bedtime as needed for muscle spasms.  Dispense: 30 tablet; Refill: 0  Memory changes -     AMB Referral [facility] Care Management        Gabriel John, NP

## 2023-05-10 ENCOUNTER — Telehealth: Payer: Self-pay | Admitting: *Deleted

## 2023-05-10 ENCOUNTER — Telehealth: Payer: Self-pay | Admitting: Primary Care

## 2023-05-10 NOTE — Telephone Encounter (Signed)
 Pt's daughter called, LVMTCB to speak with office regarding questions on medications.   Copied from CRM [id number]. Topic: Clinical - Medication Question >> May 10, 2023  4:35 PM Chuck Crater wrote: Reason for CRM: Patient daughter is requesting a callback from provider. She states that the Pharmacy called for a refill on Insulin  and she wants to make sure that it wasn't refilled yet for insulin  glargine (LANTUS  SOLOSTAR) 100 UNIT/ML Solostar Pen. She also wants to know what other medications she has refilled.

## 2023-05-10 NOTE — Progress Notes (Signed)
 Complex Care Management Note  Care Guide Note 05/10/2023 Name: ALA KRATZ MRN: [id number] DOB: 1932-08-13  SALISHA BARDSLEY is a 89 y.o. year old female who sees Gabriel John, NP for primary care. I reached out to Curley Double by phone today to offer complex care management services.  Ms. Curet was given information about Complex Care Management services today including:   The Complex Care Management services include support from the care team which includes your Nurse Care Manager, Clinical Social Worker, or Pharmacist.  The Complex Care Management team is here to help remove barriers to the health concerns and goals most important to you. Complex Care Management services are voluntary, and the patient may decline or stop services at any time by request to their care team member.   Complex Care Management Consent Status: Patient agreed to services and verbal consent obtained.   Follow up plan:  Telephone appointment with complex care management team member scheduled for:  05/17/2023  Encounter Outcome:  Patient Scheduled  Kandis Ormond, CMA [employer]  [facility], [facility] Guide Direct Dial: [phone]  Fax: [phone] Website: [email or web address]

## 2023-05-13 NOTE — Telephone Encounter (Signed)
 No, I did not refill the Lantus  insulin  as they mentioned she had plenty at home. Do they need a refill of Lantus ?  I did refill her cyclobenzaprine  muscle relaxer on 05/09/2023.  She takes this as needed for her back pain.  This was a refill request from her pharmacy.  Her cardiologist prescribes her Zetia  cholesterol pill, losartan  blood pressure pill, metoprolol  succinate blood pressure/heart pill, pantoprazole  heartburn pill, rosuvastatin  cholesterol pill.  She may have her medications on auto refill for which will automatically refill after every 90 days.

## 2023-05-14 ENCOUNTER — Ambulatory Visit (INDEPENDENT_AMBULATORY_CARE_PROVIDER_SITE_OTHER): Admitting: Internal Medicine

## 2023-05-14 ENCOUNTER — Encounter: Payer: Self-pay | Admitting: Internal Medicine

## 2023-05-14 ENCOUNTER — Ambulatory Visit: Payer: Self-pay

## 2023-05-14 VITALS — BP 110/70 | HR 66 | Temp 98.4°F | Ht 63.0 in | Wt 118.0 lb

## 2023-05-14 DIAGNOSIS — Z794 Long term (current) use of insulin: Secondary | ICD-10-CM

## 2023-05-14 DIAGNOSIS — E11319 Type 2 diabetes mellitus with unspecified diabetic retinopathy without macular edema: Secondary | ICD-10-CM

## 2023-05-14 LAB — POCT GLUCOSE FINGERSTICK: Glucose: 430 — AB (ref 70–99)

## 2023-05-14 MED ORDER — LANTUS SOLOSTAR 100 UNIT/ML ~~LOC~~ SOPN: 30.0000 [IU] | PEN_INJECTOR | Freq: Every day | SUBCUTANEOUS | 0 refills | Status: DC

## 2023-05-14 NOTE — Progress Notes (Signed)
 Subjective:    Patient ID: Laura Ferguson, female    DOB: 12/28/1932, 89 y.o.   MRN: [id number]  HPI Here with daughter for high sugars  Sugars have been out of control LIbre reading high--but not giving a number No illness or fever Brings in fast food at times--but tries to be careful Does drink sugared ginger ale daily and ?sugared tea  Does take insulin  injection every day Did increase from 15-20--thinks she is doing it right  Voiding a lot  Current Outpatient Medications on File Prior to Visit  Medication Sig Dispense Refill   aspirin 81 MG tablet Take 81 mg by mouth daily.     Blood Glucose Monitoring Suppl (ACCU-CHEK GUIDE) w/Device KIT Use as instructed to check blood sugar. 1 kit 0   Cholecalciferol (VITAMIN D) 50 MCG (2000 UT) CAPS Take 1 capsule by mouth daily.     Continuous Glucose Sensor (FREESTYLE LIBRE 3 PLUS SENSOR) MISC Change sensor every 15 days. 6 each 3   Continuous Glucose Sensor (FREESTYLE LIBRE 3 SENSOR) MISC APPLY SENSOR EVERY 14 DAYS TO CONTINOUSLY MONITOR GLUCOSE 6 each 1   cyclobenzaprine  (FLEXERIL ) 5 MG tablet Take 1 tablet (5 mg total) by mouth at bedtime as needed for muscle spasms. 30 tablet 0   ezetimibe  (ZETIA ) 10 MG tablet Take 1 tablet (10 mg total) by mouth daily. 90 tablet 2   glucose blood (ACCU-CHEK GUIDE) test strip USE TO CHECK SUGAR THREE TIMES DAILY AS DIRECTED 250 each 3   insulin  glargine (LANTUS  SOLOSTAR) 100 UNIT/ML Solostar Pen Inject 20 Units into the skin daily. for diabetes.     Insulin  Pen Needle (BD PEN NEEDLE NANO 2ND GEN) 32G X 4 MM MISC USE 1 PEN NEEDLE WITH INSULIN  PEN once daily 100 each 3   losartan  (COZAAR ) 100 MG tablet Take 1 tablet (100 mg total) by mouth daily. 90 tablet 1   metoprolol  succinate (TOPROL -XL) 50 MG 24 hr tablet TAKE 1 TABLET BY MOUTH IN THE MORNING AND AT BEDTIME TAKE  WITH  OR  IMMEDIATELY  FOLLOWING  A  MEAL 180 tablet 3   mometasone (NASONEX) 50 MCG/ACT nasal spray Place 2 sprays into the nose daily.      Multiple Vitamin (MULTIVITAMIN) capsule Take 1 capsule by mouth daily.     pantoprazole  (PROTONIX ) 40 MG tablet Take 1 tablet by mouth once daily 90 tablet 3   rosuvastatin  (CRESTOR ) 10 MG tablet Take 1 tablet (10 mg total) by mouth daily. for cholesterol. 90 tablet 1   TRAVATAN Z 0.004 % SOLN ophthalmic solution Place 1 drop into both eyes at bedtime.     Acetaminophen (TYLENOL 8 HOUR PO) Take 1 tablet by mouth every 8 (eight) hours as needed.      Azelastine HCl 137 MCG/SPRAY SOLN As needed     fexofenadine (ALLEGRA) 180 MG tablet Take 180 mg by mouth daily.     Polyethyl Glycol-Propyl Glycol (SYSTANE OP) Apply 2 drops to eye 2 (two) times daily.      sertraline  (ZOLOFT ) 25 MG tablet Take 1 tablet (25 mg total) by mouth daily. for anxiety and depression. 90 tablet 0   No current facility-administered medications on file prior to visit.    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 Electa Grieve)   CORONARY ARTERY BYPASS GRAFT  05/09/2007   LIMA to LAD, veing to intermediate; SVG to OM1 & OM2; SVG to acute marginal & distal RCA (Dr. Luna Salinas)   TRANSTHORACIC ECHOCARDIOGRAM  02/13/2010   EF =>55%, vigorous contraction EF 65%; LA mild-mod dilated;  IV normal diameter - normal CVP; trace MR; mild TR; trace AV regurg    Family History  Problem Relation Age of Onset   Cancer Mother    Diabetes Sister    Cancer Child     Social History   Socioeconomic History   Marital status: Widowed    Spouse name: Not on file   Number of children: 7   Years of education: Not on file   Highest education level: Not on file  Occupational History   Not on file  Tobacco Use   Smoking status: Never   Smokeless tobacco: Never   Tobacco comments:    quit 1960's  smoked very lightly.  Substance and Sexual Activity   Alcohol use: No    Alcohol/week: 0.0 standard drinks of alcohol   Drug use: No   Sexual activity: Not on file  Other Topics Concern   Not on file  Social History Narrative   Married.   Retired.   Works as a Engineer, structural.    Enjoys helping other.    Social Drivers of Corporate investment banker Strain: Low Risk  (01/22/2023)   Overall Financial Resource Strain (CARDIA)    Difficulty of Paying Living Expenses: Not hard at all  Food Insecurity: No Food Insecurity (01/22/2023)   Hunger Vital Sign    Worried About Running Out of Food in the Last Year: Never true    Ran Out of Food in the Last Year: Never true  Transportation Needs: No Transportation Needs (01/22/2023)   PRAPARE - Administrator, Civil Service (Medical): No    Lack of Transportation (Non-Medical): No  Physical Activity: Inactive (01/22/2023)   Exercise Vital Sign    Days of Exercise per Week: 0 days    Minutes of Exercise per Session: 0 min  Stress: No Stress Concern Present (01/22/2023)   [employer] of Occupational Health - Occupational Stress Questionnaire    Feeling of Stress : Only a little  Social Connections: Moderately Isolated (01/22/2023)   Social Connection and Isolation Panel [NHANES]    Frequency of Communication with Friends and Family: More than three times a week    Frequency of Social Gatherings with Friends and Family: Once a week     Attends Religious Services: More than 4 times per year    Active Member of [employer] or Organizations: No    Attends Banker Meetings: Never    Marital Status: Widowed  Intimate Partner Violence: Not At Risk (01/22/2023)   Humiliation, Afraid, Rape, and Kick questionnaire    Fear of Current or Ex-Partner: No  Emotionally Abused: No    Physically Abused: No    Sexually Abused: No   Review of Systems Some nausea Some balance issues as well--discussed dehydration    Objective:   Physical Exam Constitutional:      Appearance: Normal appearance.  Neurological:     Mental Status: She is alert.  Psychiatric:     Comments: Adamant that she remembers her insulin  Poor insight about intake---sugar in drinks, etc. Somewhat defensive            Assessment & Plan:

## 2023-05-14 NOTE — Telephone Encounter (Signed)
 Patient's daughter, Leward Record, called to see if she could get patient in today because she states patient has been having drops in blood sugar and is getting upset. Leward Record is uncertain of symptoms and specific information, stating she believes her mother stated that her glucometer is not working. Patient is not with Leward Record at this time. This RN attempted to contact patient twice with no answer. Leward Record states she will go to patient's house to check on her and this RN advised urgent care (after giving juice) if patient is symptomatic. Patient's daughter states she will call back in if patient is not symptomatic to determine best plan of care.  Copied from CRM [id number]. Topic: Clinical - Red Word Triage >> May 14, 2023  9:17 AM Rosamond Comes wrote: Red Word that prompted transfer to Nurse Triage: patient daughter Leward Record calling in stating patient blood sugar has dropped, started yesterday and awake all night, not feeling good this morning.

## 2023-05-14 NOTE — Assessment & Plan Note (Signed)
 Very poor control Will increase the lantus  to 30 units daily Urged the family to monitor what she buys and cut out all sugared products

## 2023-05-14 NOTE — Patient Instructions (Addendum)
 Please cut out all sugar in drinks or foods. Drink lots of water till your sugars are back under 200 again (may take a while) Limit potatoes, pasta, rice and switch to keto bread if you eat bread.  Increase the insulin  to 30 units daily  Diabetes Mellitus and Nutrition, Adult When you have diabetes, or diabetes mellitus, it is very important to have healthy eating habits because your blood sugar (glucose) levels are greatly affected by what you eat and drink. Eating healthy foods in the right amounts, at about the same times every day, can help you: Manage your blood glucose. Lower your risk of heart disease. Improve your blood pressure. Reach or maintain a healthy weight. What can affect my meal plan? Every person with diabetes is different, and each person has different needs for a meal plan. Your health care provider may recommend that you work with a dietitian to make a meal plan that is best for you. Your meal plan may vary depending on factors such as: The calories you need. The medicines you take. Your weight. Your blood glucose, blood pressure, and cholesterol levels. Your activity level. Other health conditions you have, such as heart or kidney disease. How do carbohydrates affect me? Carbohydrates, also called carbs, affect your blood glucose level more than any other type of food. Eating carbs raises the amount of glucose in your blood. It is important to know how many carbs you can safely have in each meal. This is different for every person. Your dietitian can help you calculate how many carbs you should have at each meal and for each snack. How does alcohol affect me? Alcohol can cause a decrease in blood glucose (hypoglycemia), especially if you use insulin  or take certain diabetes medicines by mouth. Hypoglycemia can be a life-threatening condition. Symptoms of hypoglycemia, such as sleepiness, dizziness, and confusion, are similar to symptoms of having too much alcohol. Do  not drink alcohol if: Your health care provider tells you not to drink. You are pregnant, may be pregnant, or are planning to become pregnant. If you drink alcohol: Limit how much you have to: 0-1 drink a day for women. 0-2 drinks a day for men. Know how much alcohol is in your drink. In the [place] one drink equals one 12 oz bottle of beer (355 mL), one 5 oz glass of wine (148 mL), or one 1 oz glass of hard liquor (44 mL). Keep yourself hydrated with water, diet soda, or unsweetened iced tea. Keep in mind that regular soda, juice, and other mixers may contain a lot of sugar and must be counted as carbs. What are tips for following this plan?  Reading food labels Start by checking the serving size on the Nutrition Facts label of packaged foods and drinks. The number of calories and the amount of carbs, fats, and other nutrients listed on the label are based on one serving of the item. Many items contain more than one serving per package. Check the total grams (g) of carbs in one serving. Check the number of grams of saturated fats and trans fats in one serving. Choose foods that have a low amount or none of these fats. Check the number of milligrams (mg) of salt (sodium) in one serving. Most people should limit total sodium intake to less than 2,300 mg per day. Always check the nutrition information of foods labeled as "low-fat" or "nonfat." These foods may be higher in added sugar or refined carbs and should be avoided. Talk  to your dietitian to identify your daily goals for nutrients listed on the label. Shopping Avoid buying canned, pre-made, or processed foods. These foods tend to be high in fat, sodium, and added sugar. Shop around the outside edge of the grocery store. This is where you will most often find fresh fruits and vegetables, bulk grains, fresh meats, and fresh dairy products. Cooking Use low-heat cooking methods, such as baking, instead of high-heat cooking methods, such as deep  frying. Cook using healthy oils, such as olive, canola, or sunflower oil. Avoid cooking with butter, cream, or high-fat meats. Meal planning Eat meals and snacks regularly, preferably at the same times every day. Avoid going long periods of time without eating. Eat foods that are high in fiber, such as fresh fruits, vegetables, beans, and whole grains. Eat 4-6 oz (112-168 g) of lean protein each day, such as lean meat, chicken, fish, eggs, or tofu. One ounce (oz) (28 g) of lean protein is equal to: 1 oz (28 g) of meat, chicken, or fish. 1 egg.  cup (62 g) of tofu. Eat some foods each day that contain healthy fats, such as avocado, nuts, seeds, and fish. What foods should I eat? Fruits Berries. Apples. Oranges. Peaches. Apricots. Plums. Grapes. Mangoes. Papayas. Pomegranates. Kiwi. Cherries. Vegetables Leafy greens, including lettuce, spinach, kale, chard, collard greens, mustard greens, and cabbage. Beets. Cauliflower. Broccoli. Carrots. Green beans. Tomatoes. Peppers. Onions. Cucumbers. Brussels sprouts. Grains Whole grains, such as whole-wheat or whole-grain bread, crackers, tortillas, cereal, and pasta. Unsweetened oatmeal. Quinoa. Brown or wild rice. Meats and other proteins Seafood. Poultry without skin. Lean cuts of poultry and beef. Tofu. Nuts. Seeds. Dairy Low-fat or fat-free dairy products such as milk, yogurt, and cheese. The items listed above may not be a complete list of foods and beverages you can eat and drink. Contact a dietitian for more information. What foods should I avoid? Fruits Fruits canned with syrup. Vegetables Canned vegetables. Frozen vegetables with butter or cream sauce. Grains Refined white flour and flour products such as bread, pasta, snack foods, and cereals. Avoid all processed foods. Meats and other proteins Fatty cuts of meat. Poultry with skin. Breaded or fried meats. Processed meat. Avoid saturated fats. Dairy Full-fat yogurt, cheese, or  milk. Beverages Sweetened drinks, such as soda or iced tea. The items listed above may not be a complete list of foods and beverages you should avoid. Contact a dietitian for more information. Questions to ask a health care provider Do I need to meet with a certified diabetes care and education specialist? Do I need to meet with a dietitian? What number can I call if I have questions? When are the best times to check my blood glucose? Where to find more information: American Diabetes Association: diabetes.org Academy of Nutrition and Dietetics: eatright[employer] of Diabetes and Digestive and Kidney Diseases: StageSync.si Association of Diabetes Care & Education Specialists: diabeteseducator.org Summary It is important to have healthy eating habits because your blood sugar (glucose) levels are greatly affected by what you eat and drink. It is important to use alcohol carefully. A healthy meal plan will help you manage your blood glucose and lower your risk of heart disease. Your health care provider may recommend that you work with a dietitian to make a meal plan that is best for you. This information is not intended to replace advice given to you by your health care provider. Make sure you discuss any questions you have with your health care provider. Document Revised:  08/04/2019 Document Reviewed: 08/05/2019 Elsevier Patient Education  2024 [employer].

## 2023-05-14 NOTE — Telephone Encounter (Signed)
 Noted.

## 2023-05-15 NOTE — Telephone Encounter (Signed)
 Patient was seen by another provider yesterday and all information was given .  No further action needed at this time.

## 2023-05-17 ENCOUNTER — Other Ambulatory Visit: Payer: Self-pay

## 2023-05-17 NOTE — Patient Outreach (Signed)
 Complex Care Management   Visit Note  05/17/2023  Name:  Laura Ferguson MRN: [id number] DOB: July 17, 1932  Situation: Referral received for Complex Care Management related to  diabetes and memory issues  I obtained verbal consent from  daughter/ designated party release, Laura Ferguson .  Visit completed with daughter  on the phone.  This telephone outreach was requested to be made with Daughter, Laura Ferguson. Daughter states patient is having issues with managing her diabetes. She states patient's blood sugars have been, "all over the place" and she's having difficulty with her free style libre sensors working correctly. She states this has caused much frustration for her mother.  She states they have attempted to get assistance with the sensors by going to the [facility] and having pharmacist provide some education.   Daughter confirms patient has a backup glucometer to manually check blood sugar.  Daughter states she is concerned about patient's memory and her living along. She states patient still seems to be able to safely care for herself. Daughter states patient is not driving so she and her sister assist with doctors appointments, errands, etc. Daughter states patient is hard of hearing however does not wear her hearing aids consistently. Daughter states patient has lost 6  children over the last 6 years.  She states she is concern patient may be dealing with depression due to loss.  Daughter unable to complete assessment. Will need to speak with patient to get verbal approval to continue case management follow up then complete assessment.   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

## 2023-05-21 ENCOUNTER — Other Ambulatory Visit: Payer: Self-pay

## 2023-05-21 DIAGNOSIS — Z139 Encounter for screening, unspecified: Secondary | ICD-10-CM

## 2023-05-22 ENCOUNTER — Encounter: Payer: Self-pay | Admitting: Cardiovascular Disease

## 2023-05-22 ENCOUNTER — Ambulatory Visit: Payer: Medicare HMO | Attending: Cardiovascular Disease | Admitting: Cardiovascular Disease

## 2023-05-22 VITALS — BP 114/68 | HR 56 | Ht 63.0 in | Wt 114.0 lb

## 2023-05-22 DIAGNOSIS — I2510 Atherosclerotic heart disease of native coronary artery without angina pectoris: Secondary | ICD-10-CM

## 2023-05-22 DIAGNOSIS — R002 Palpitations: Secondary | ICD-10-CM | POA: Diagnosis not present

## 2023-05-22 DIAGNOSIS — E785 Hyperlipidemia, unspecified: Secondary | ICD-10-CM

## 2023-05-22 DIAGNOSIS — Z951 Presence of aortocoronary bypass graft: Secondary | ICD-10-CM | POA: Diagnosis not present

## 2023-05-22 DIAGNOSIS — I10 Essential (primary) hypertension: Secondary | ICD-10-CM | POA: Diagnosis not present

## 2023-05-22 DIAGNOSIS — K219 Gastro-esophageal reflux disease without esophagitis: Secondary | ICD-10-CM

## 2023-05-22 DIAGNOSIS — E1159 Type 2 diabetes mellitus with other circulatory complications: Secondary | ICD-10-CM

## 2023-05-22 DIAGNOSIS — Z794 Long term (current) use of insulin: Secondary | ICD-10-CM

## 2023-05-22 NOTE — Patient Instructions (Signed)
 Medication Instructions:  NO CHANGES *If you need a refill on your cardiac medications before your next appointment, please call your pharmacy*  Lab Work: [facility] AND FASTING LIPID PANEL TODAY If you have labs (blood work) drawn today and your tests are completely normal, you will receive your results only by: MyChart Message (if you have MyChart) OR A paper copy in the mail If you have any lab test that is abnormal or we need to change your treatment, we will call you to review the results.  Testing/Procedures: NO TESTING  Follow-Up: At [facility], you and your health needs are our priority.  As part of our continuing mission to provide you with exceptional heart care, our providers are all part of one team.  This team includes your primary Cardiologist (physician) and Advanced Practice Providers or APPs (Physician Assistants and Nurse Practitioners) who all work together to provide you with the care you need, when you need it.  Your next appointment:   6-8 month(s)  Provider:   Sammy Crisp, MD

## 2023-05-22 NOTE — Patient Outreach (Signed)
 Complex Care Management   Visit Note  05/22/2023  Name:  Laura Ferguson MRN: [id number] DOB: 20-Jan-1932  Situation: Referral received for Complex Care Management related to  diabetes  I obtained verbal consent from Patient.  Visit completed with patient  on the phone.  Unable to complete full assessment with patient.  Patient rescheduled for follow up call with RN case manager in 1 week.   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   LIMA to LAD, SVG to ramus intermedius, SVG to OM1 & OM2, SVG to acute marginal, SVG to distal RCA   Type 2 diabetes mellitus (HCC)     Assessment: Patient Reported Symptoms:  Cognitive Cognitive Status: Alert and oriented to person, place, and time, Insightful and able to interpret abstract concepts, Normal speech and language skills Cognitive/Intellectual Conditions Management [RPT]: None reported or documented in medical history or problem list      Neurological Neurological Review of Symptoms: Dizziness    HEENT HEENT Symptoms Reported: Nosebleed, Runny nose      Cardiovascular      Respiratory Respiratory Symptoms Reported: No symptoms reported Respiratory Conditions: Seasonal allergies  Endocrine      Gastrointestinal        Genitourinary      Integumentary      Musculoskeletal          Psychosocial Psychosocial Symptoms Reported: Depression - if selected complete PHQ 2-9 Additional Psychological Details: Patient reports losing 5 sons and 1 daughter over the last 5-6 years.  Patient states she is trying really hard to manage her diabetes however having a lot of  problems with her free style libre sensors not working correctly. Behavioral Health Conditions: Depression Behavioral Management Strategies: Medication therapy, Support system Behavioral Health Self-Management Outcome: 3 (uncertain) Major Change/Loss/Stressor/Fears (CP): Death of a loved one, Medical condition, self Behaviors When Feeling Stressed/Fearful: tearful, stays to herself Techniques to Cope with Loss/Stress/Change: Diversional activities, Medication, Withdraw Quality of Family Relationships: supportive Do you feel physically threatened by others?: No      05/21/2023    4:16 PM  Depression screen PHQ 2/9  Decreased Interest 3  Down, Depressed, Hopeless 3  PHQ - 2 Score 6  Altered sleeping 3  Tired, decreased energy 0  Change in appetite 2  Feeling bad or failure about yourself  1  Trouble concentrating 0  Moving slowly or fidgety/restless 2  Suicidal thoughts 0  PHQ-9 Score 14    There were no vitals filed for this visit.  Medications Reviewed Today     Reviewed by Arjun Hard E, RN (Registered Nurse) on 05/21/23 at 1636  Med List Status: <None>   Medication Order Taking? Sig Documenting Provider Last Dose Status Informant  Acetaminophen (TYLENOL 8 HOUR PO) [id number] Yes Take 1 tablet by mouth every 8 (eight) hours as needed.  [provider] Taking Active Self  aspirin 81 MG tablet [id number] Yes Take 81 mg by mouth daily. [provider] Taking Active Self  Azelastine HCl 137 MCG/SPRAY SOLN [id number] Yes As needed [provider] Taking Active  Blood Glucose Monitoring Suppl (ACCU-CHEK GUIDE) w/Device KIT [id number]  Use as instructed to check blood sugar. Emilie Harden, MD  Active   Cholecalciferol (VITAMIN D) 50 MCG (2000 UT) CAPS [id number] Yes Take 1 capsule by mouth daily. [provider] Taking Active   Continuous Glucose Sensor (FREESTYLE LIBRE 3 PLUS SENSOR) MISC [id number]  Change sensor every 15 days. Gabriel John,  NP  Active   Continuous Glucose Sensor (FREESTYLE LIBRE 3 SENSOR) [place] [id number]  APPLY SENSOR EVERY 14 DAYS TO CONTINOUSLY MONITOR GLUCOSE Clark, Katherine K, NP  Active   cyclobenzaprine  (FLEXERIL ) 5 MG tablet [id number] Yes Take 1 tablet (5 mg total) by mouth at bedtime as needed for muscle spasms. Gabriel John, NP Taking Active   ezetimibe  (ZETIA ) 10 MG tablet [id number] Yes Take 1 tablet (10 mg total) by mouth daily. Millicent Ally, MD Taking Active   fexofenadine (ALLEGRA) 180 MG tablet [id number] Yes Take 180 mg by mouth daily. [provider] Taking Active   glucose blood (ACCU-CHEK GUIDE) test strip [id number]  USE TO CHECK SUGAR THREE TIMES DAILY AS DIRECTED Emilie Harden, MD  Active   insulin  glargine (LANTUS  SOLOSTAR) 100 UNIT/ML Solostar Pen [id number] Yes Inject 30 Units into the skin daily. for diabetes. Helaine Llanos, MD Taking Active   Insulin  Pen Needle (BD PEN NEEDLE NANO 2ND GEN) 32G X 4 MM MISC [id number]  USE 1 PEN NEEDLE WITH INSULIN  PEN once daily Clark, Katherine K, NP  Active   losartan  (COZAAR ) 100 MG tablet [id number] Yes Take 1 tablet (100 mg total) by mouth daily. Millicent Ally, MD Taking Active   metoprolol  succinate (TOPROL -XL) 50 MG 24 hr tablet [id number] Yes TAKE 1 TABLET BY MOUTH IN THE MORNING AND AT BEDTIME TAKE  WITH  OR  IMMEDIATELY  FOLLOWING  A  MEAL Millicent Ally, MD Taking Active   mometasone (NASONEX) 50 MCG/ACT nasal spray [id number] Yes Place 2 sprays into the nose daily. [provider] Taking Active   Multiple Vitamin (MULTIVITAMIN) capsule [id number] Yes Take 1 capsule by mouth daily. [provider] Taking Active Self  pantoprazole  (PROTONIX ) 40 MG tablet [id number] Yes Take 1 tablet by mouth once daily Millicent Ally, MD Taking Active   Polyethyl Glycol-Propyl Glycol (SYSTANE OP) [id number] Yes Apply 2 drops to eye 2 (two) times daily.  [provider] Taking Active Self  rosuvastatin  (CRESTOR ) 10 MG tablet  [id number] Yes Take 1 tablet (10 mg total) by mouth daily. for cholesterol. Millicent Ally, MD Taking Active   sertraline  (ZOLOFT ) 25 MG tablet [id number] Yes Take 1 tablet (25 mg total) by mouth daily. for anxiety and depression. Gabriel John, NP Taking Active   TRAVATAN Z 0.004 % SOLN ophthalmic solution [id number] Yes Place 1 drop into both eyes at bedtime. [provider] Taking Active Self           Med Note Caroline Cinnamon, Wilton Hasting Dec 05, 2014  2:09 PM)              Recommendation:   PCP Follow-up  Follow Up Plan:   Telephone follow-up in 1 month with RN care management  Verba Girt RN, BSN, CCM [facility]  [facility], Population Health Case Manager Phone: [phone]
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
## 2023-05-23 LAB — CBC
Hematocrit: 37.3 % (ref 34.0–46.6)
Hemoglobin: 11.6 g/dL (ref 11.1–15.9)
MCH: 30.2 pg (ref 26.6–33.0)
MCHC: 31.1 g/dL — ABNORMAL LOW (ref 31.5–35.7)
MCV: 97 fL (ref 79–97)
Platelets: 164 10*3/uL (ref 150–450)
RBC: 3.84 x10E6/uL (ref 3.77–5.28)
RDW: 12.9 % (ref 11.7–15.4)
WBC: 4.2 10*3/uL (ref 3.4–10.8)

## 2023-05-23 LAB — COMPREHENSIVE METABOLIC PANEL WITH GFR
ALT: 28 IU/L (ref 0–32)
AST: 26 IU/L (ref 0–40)
Albumin: 4.5 g/dL (ref 3.7–4.7)
Alkaline Phosphatase: 91 IU/L (ref 44–121)
BUN/Creatinine Ratio: 23 (ref 12–28)
BUN: 31 mg/dL — ABNORMAL HIGH (ref 8–27)
Bilirubin Total: 0.5 mg/dL (ref 0.0–1.2)
CO2: 18 mmol/L — ABNORMAL LOW (ref 20–29)
Calcium: 9.7 mg/dL (ref 8.7–10.3)
Chloride: 105 mmol/L (ref 96–106)
Creatinine, Ser: 1.32 mg/dL — ABNORMAL HIGH (ref 0.57–1.00)
Globulin, Total: 2.7 g/dL (ref 1.5–4.5)
Glucose: 383 mg/dL — ABNORMAL HIGH (ref 70–99)
Potassium: 5.4 mmol/L — ABNORMAL HIGH (ref 3.5–5.2)
Sodium: 141 mmol/L (ref 134–144)
Total Protein: 7.2 g/dL (ref 6.0–8.5)
eGFR: 39 mL/min/{1.73_m2} — ABNORMAL LOW (ref >59)

## 2023-05-23 LAB — LIPID PANEL
Chol/HDL Ratio: 1.8 ratio (ref 0.0–4.4)
Cholesterol, Total: 126 mg/dL (ref 100–199)
HDL: 71 mg/dL (ref >39)
LDL Chol Calc (NIH): 42 mg/dL (ref 0–99)
Triglycerides: 58 mg/dL (ref 0–149)
VLDL Cholesterol Cal: 13 mg/dL (ref 5–40)

## 2023-05-23 LAB — TSH: TSH: 0.483 u[IU]/mL (ref 0.450–4.500)

## 2023-05-24 ENCOUNTER — Telehealth: Payer: Self-pay

## 2023-05-24 ENCOUNTER — Encounter: Payer: Self-pay | Admitting: Cardiovascular Disease

## 2023-05-24 NOTE — Progress Notes (Unsigned)
 Complex Care Management Note Care Guide Note  05/24/2023 Name: Laura Ferguson MRN: [id number] DOB: 03-Jan-1933   Complex Care Management Outreach Attempts: An unsuccessful telephone outreach was attempted today to offer the patient information about available complex care management services.  Follow Up Plan:  Additional outreach attempts will be made to offer the patient complex care management information and services.   Encounter Outcome:  No Answer  Lenton Rail , RMA     [place]  [facility], [facility] Guide  Direct Dial: [phone]  Website: [email or web address]

## 2023-05-24 NOTE — Telephone Encounter (Signed)
 Patient returned call

## 2023-05-27 NOTE — Progress Notes (Signed)
 Complex Care Management Note  Care Guide Note 05/27/2023 Name: Laura Ferguson MRN: [id number] DOB: 1932/08/16  Laura Ferguson is a 89 y.o. year old female who sees Gabriel John, NP for primary care. I reached out to Curley Double by phone today to offer complex care management services.  [name] was given information about Complex Care Management services today including:   The Complex Care Management services include support from the care team which includes your Nurse Care Manager, Clinical Social Worker, or Pharmacist.  The Complex Care Management team is here to help remove barriers to the health concerns and goals most important to you. Complex Care Management services are voluntary, and the patient may decline or stop services at any time by request to their care team member.   Complex Care Management Consent Status: Patient agreed to services and verbal consent obtained.   Follow up plan:  Telephone appointment with complex care management team member scheduled for:  06/18/2023  Encounter Outcome:  Patient Scheduled  Lenton Rail , RMA     [place]  [facility] Guide  Direct Dial: [phone]  Website: [email or web address]

## 2023-05-28 ENCOUNTER — Ambulatory Visit: Admitting: Primary Care

## 2023-05-29 ENCOUNTER — Other Ambulatory Visit: Payer: Self-pay | Admitting: Cardiovascular Disease

## 2023-05-30 ENCOUNTER — Other Ambulatory Visit: Payer: Self-pay

## 2023-05-30 ENCOUNTER — Ambulatory Visit: Payer: Self-pay

## 2023-05-30 VITALS — Wt 114.0 lb

## 2023-05-30 DIAGNOSIS — E1110 Type 2 diabetes mellitus with ketoacidosis without coma: Secondary | ICD-10-CM

## 2023-05-30 NOTE — Patient Outreach (Signed)
 Complex Care Management   Visit Note  05/30/2023  Name:  Laura Ferguson MRN: [id number] DOB: 20-Jun-1932  Situation: Referral received for Complex Care Management related to diabetes I obtained verbal consent from Patient.  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   LIMA to LAD, SVG to ramus intermedius, SVG to OM1 & OM2, SVG to acute marginal, SVG to distal RCA   Type 2 diabetes mellitus (HCC)     Assessment: Patient Reported Symptoms:  Cognitive Cognitive Status: Alert and oriented to person, place, and time, Insightful and able to interpret abstract concepts, Normal speech and language skills Cognitive/Intellectual Conditions Management [RPT]: None reported or documented in medical history or problem list      Neurological Neurological Review of Symptoms: Dizziness Neurological Conditions:  (patient reports history of vertigo) Neurological Management Strategies: Routine screening Neurological Self-Management Outcome: 4 (good)  HEENT HEENT Symptoms Reported: Runny nose, Nosebleed (reports occasional mild nose bleed) HEENT Management Strategies: Routine screening    Cardiovascular Cardiovascular Symptoms Reported: No symptoms reported Does patient have uncontrolled Hypertension?: No Cardiovascular Conditions: Hypertension, Coronary artery disease Cardiovascular Management Strategies: Routine screening, Medication therapy, Diet modification Weight: 114 lb (51.7 kg) Cardiovascular Self-Management Outcome: 4 (good)  Respiratory Respiratory Symptoms Reported: No  symptoms reported Respiratory Conditions: Seasonal allergies Respiratory Self-Management Outcome: 4 (good)  Endocrine Patient reports the following symptoms related to hypoglycemia or hyperglycemia : No symptoms reported Is patient diabetic?: Yes Is patient checking blood sugars at home?: Yes Endocrine Conditions: Diabetes Endocrine Management Strategies: Routine screening, Medication therapy Endocrine Self-Management Outcome: 3 (uncertain) Endocrine Comment: Patient states her blood sugars drop <70 every morning around 3 am. Reports blood sugar this morning at 3 am dropped to 61.  Patient states she treated blood sugar with sweetened drink.  She states her blood sugars can range from 59 to 300. Patient still voices concern about the freestyle libre and it calculating her blood sugars correctly.  Patient reports having a glucometer that she can check her blood sugars with is she is concerned about the CGM accuracy. Patient states she is scheduled for a follow up with her primary care provider on 06/04/23.  Gastrointestinal Gastrointestinal Symptoms Reported: Diarrhea, Other Other Gastrointestinal Symptoms: GERD Gastrointestinal Conditions: Diarrhea Gastrointestinal Management Strategies: Medication therapy Gastrointestinal Self-Management Outcome: 4 (good) Gastrointestinal Comment: Patient states she had a few days of diarrhea after eating out at a restaurant.  She states the diarrhea is subsiding. Denies any nausea and vomiting.    Genitourinary Genitourinary Symptoms Reported: No symptoms reported    Integumentary Integumentary Symptoms Reported: No symptoms reported    Musculoskeletal Musculoskelatal Symptoms Reviewed: No symptoms reported        Psychosocial Psychosocial Symptoms Reported: Not assessed            05/21/2023    4:16 PM  Depression screen PHQ 2/9  Decreased Interest 3  Down, Depressed, Hopeless 3  PHQ - 2 Score 6  Altered sleeping 3  Tired, decreased energy 0   Change in appetite 2  Feeling  bad or failure about yourself  1  Trouble concentrating 0  Moving slowly or fidgety/restless 2  Suicidal thoughts 0  PHQ-9 Score 14    There were no vitals filed for this visit.  Medications Reviewed Today     Reviewed by Shalini Mair E, RN (Registered Nurse) on 05/30/23 at 1148  Med List Status: <None>   Medication Order Taking? Sig Documenting Provider Last Dose Status Informant  Acetaminophen (TYLENOL 8 HOUR PO) [id number] No Take 1 tablet by mouth every 8 (eight) hours as needed.  [provider] Taking Active Self  aspirin 81 MG tablet [id number] No Take 81 mg by mouth daily. [provider] Taking Active Self  Azelastine HCl 137 MCG/SPRAY SOLN [id number] No As needed [provider] Taking Active   Blood Glucose Monitoring Suppl (ACCU-CHEK GUIDE) w/Device KIT [id number] No Use as instructed to check blood sugar. Emilie Harden, MD Taking Active   Cholecalciferol (VITAMIN D) 50 MCG (2000 UT) CAPS [id number] No Take 1 capsule by mouth daily. [provider] Taking Active   Continuous Glucose Sensor (FREESTYLE LIBRE 3 PLUS SENSOR) MISC [id number] No Change sensor every 15 days. Gabriel John, NP Taking Active   Continuous Glucose Sensor (FREESTYLE LIBRE 3 SENSOR) [place] [id number] No APPLY SENSOR EVERY 14 DAYS TO CONTINOUSLY MONITOR GLUCOSE Gabriel John, NP Taking Active   cyclobenzaprine  (FLEXERIL ) 5 MG tablet [id number] No Take 1 tablet (5 mg total) by mouth at bedtime as needed for muscle spasms. Gabriel John, NP Taking Active   ezetimibe  (ZETIA ) 10 MG tablet [id number] No Take 1 tablet (10 mg total) by mouth daily. Millicent Ally, MD Taking Active   fexofenadine (ALLEGRA) 180 MG tablet [id number] No Take 180 mg by mouth daily. [provider] Taking Active   glucose blood (ACCU-CHEK GUIDE) test strip [id number] No USE TO CHECK SUGAR THREE TIMES DAILY AS DIRECTED Emilie Harden, MD Taking Active    insulin  glargine (LANTUS  SOLOSTAR) 100 UNIT/ML Solostar Pen [id number] No Inject 30 Units into the skin daily. for diabetes. Helaine Llanos, MD Taking Active   Insulin  Pen Needle (BD PEN NEEDLE NANO 2ND GEN) 32G X 4 MM MISC [id number] No USE 1 PEN NEEDLE WITH INSULIN  PEN once daily Clark, Katherine K, NP Taking Active   losartan  (COZAAR ) 100 MG tablet [id number] No Take 1 tablet (100 mg total) by mouth daily. Millicent Ally, MD Taking Active   metoprolol  succinate (TOPROL -XL) 50 MG 24 hr tablet [id number] No TAKE 1 TABLET BY MOUTH IN THE MORNING AND AT BEDTIME TAKE  WITH  OR  IMMEDIATELY  FOLLOWING  A  MEAL Millicent Ally, MD Taking Active   mometasone (NASONEX) 50 MCG/ACT nasal spray [id number] No Place 2 sprays into the nose daily. [provider] Taking Active   Multiple Vitamin (MULTIVITAMIN) capsule [id number] No Take 1 capsule by mouth daily. [provider] Taking Active Self  pantoprazole  (PROTONIX ) 40 MG tablet [id number]  Take 1 tablet by mouth once daily Millicent Ally, MD  Active   Polyethyl Glycol-Propyl Glycol (SYSTANE OP) [id number] No Apply 2 drops to eye 2 (two) times daily.  [provider] Taking Active Self  rosuvastatin  (CRESTOR ) 10 MG tablet [id number] No Take 1 tablet (10 mg total) by mouth daily. for cholesterol. Millicent Ally, MD Taking Active   sertraline  (ZOLOFT ) 25 MG tablet [id number] No Take 1 tablet (25 mg total) by mouth daily. for anxiety and depression. Gabriel John, NP Taking Active   TRAVATAN  Z 0.004 % SOLN ophthalmic solution [id number] No Place 1 drop into both eyes at bedtime. [provider] Taking Active Self           Med Note Caroline Cinnamon, Wilton Hasting Dec 05, 2014  2:09 PM)              Recommendation:   PCP Follow-up  Follow Up Plan:   Telephone follow-up in 1 month with RN case manager  Verba Girt RN, BSN, CCM [place]  [facility], Population Health Case Manager Phone:  [phone]

## 2023-05-30 NOTE — Patient Instructions (Signed)
 Visit Information  Thank you for taking time to visit with me today. Please don't hesitate to contact me if I can be of assistance to you before our next scheduled appointment.  Your next care management appointment is by telephone on 07/04/23 at 11 am  Telephone follow-up with RN Case manager  Please call the care guide team at [phone] if you need to cancel, schedule, or reschedule an appointment.   Please call the Suicide and Crisis Lifeline: 988 call 1-800-273-TALK (toll free, 24 hour hotline) if you are experiencing a Mental Health or Behavioral Health Crisis or need someone to talk to.  Braylee Lal RN, BSN, CCM Laura Ferguson  [facility], Population Health Case Manager Phone: [phone]  Diabetes: Self-Care  Diabetes mellitus, or diabetes, is a long-term (chronic) disease. It happens when your body doesn't properly use the sugar, or glucose, that's released from food after you eat. Diabetes may happen if: Your pancreas doesn't make enough of a hormone called insulin . Your body doesn't react like it should to the insulin  that it makes. Insulin  lets sugar go into the cells in your body. This gives you energy. If you have diabetes, the sugar can't get into the cells. This causes high blood sugar. How to treat and manage diabetes You may need to take insulin  or other medicines each day to keep your blood sugar in balance. If you need to take insulin , you'll learn how to give it to yourself as a shot. You may need to change the amount of insulin  you take based on the foods you eat. You'll need to check your blood sugar levels with a glucose monitor. The readings will show if you have low or high blood sugar. In general, you should have these blood sugar levels: Before meals: 80-130 mg/dL (4.4-7.2 mmol/L). After meals: below 180 mg/dL (10 mmol/L). Hemoglobin A1c (HbA1c) level: less than 7%. Your health care provider will set treatment goals for you. Keep all  follow-up visits. Your provider will watch you closely to make sure treatment is working. Follow these instructions at home: Diabetes medicines Take your medicines every day as told by your provider. List your medicines here: Name of medicine: ______________________________ Amount (dose): _______________ Time (a.m./p.m.): _______________ Notes: ___________________________________ Name of medicine: ______________________________ Amount (dose): _______________ Time (a.m./p.m.): _______________ Notes: ___________________________________ Name of medicine: ______________________________ Amount (dose): _______________ Time (a.m./p.m.): _______________ Notes: ___________________________________ Insulin  If you use insulin , list the types of insulin  you use here: Insulin  type: ______________________________ Amount (dose): _______________ Time (a.m./p.m.): _______________Notes: ___________________________________ Insulin  type: ______________________________ Amount (dose): _______________ Time (a.m./p.m.): _______________ Notes: ___________________________________ Insulin  type: ______________________________ Amount (dose): _______________ Time (a.m./p.m.): _______________ Notes: ___________________________________ Insulin  type: ______________________________ Amount (dose): _______________ Time (a.m./p.m.): _______________ Notes: ___________________________________ Insulin  type: ______________________________ Amount (dose): _______________ Time (a.m./p.m.): _______________ Notes: ___________________________________ Managing blood sugar  Check your blood sugar levels with a glucose monitor as told by your provider. Write down the times that you check your levels here: Time: _______________ Notes: ___________________________________ Time: _______________ Notes: ___________________________________ Time: _______________ Notes: ___________________________________ Time: _______________ Notes:  ___________________________________ Time: _______________ Notes: ___________________________________ Time: _______________ Notes: ___________________________________  Low blood sugar Low blood sugar is when the amount of sugar in your blood is at or below 70 mg/dL (3.9 mmol/L). Symptoms may include: Feeling: Hungry. Sweaty and clammy. Irritable or easily upset. Dizzy. Sleepy. Having: A fast heartbeat. A headache. A change in your eyesight. Numbness around your mouth, lips, or tongue. Having trouble with: Moving your body, or coordination. Sleeping. Treating low blood sugar If you have  low blood sugar, eat or drink something with sugar in it right away. If you can think clearly and swallow safely, follow the 15:15 rule: Take 15 gramsof a fast-acting carbohydrate (carb). Options include: 4 oz (120 mL) of fruit juice. 4 oz (120 mL) of soda (not diet soda). A few pieces of hard candy. Check food labels to see how many pieces to eat. 1 Tbsp (15 mL) of sugar or honey. 4 glucose tablets. 1 tube of glucose gel. Check your blood sugar 15 minutes after you take the carb. If your blood sugar is still at or below 70 mg/dL (3.9 mmol/L), take 15 grams of a carb again. If your blood sugar doesn't go above 70 mg/dL (3.9 mmol/L) after 3 tries, get help right away. After your blood sugar goes back to normal, eat a meal or a snack within 1 hour. Treating very low blood sugar If your blood sugar is less than 54 mg/dL (3 mmol/L), it's an emergency. Get help right away. If you can't eat or drink, you will need to be given glucagon. A family member or friend should learn how to check your blood sugar and give you glucagon. Ask your provider if you should keep a glucagon kit at home. You may also need to be treated in a hospital. Questions to ask your health care provider Should I talk with a diabetes educator? What tools will I need to care for myself at home? What medicines do I need? When should I  take them? How often do I need to check my blood sugar levels? What number can I call if I have questions? When is my follow-up visit? Where can I find a support group for people with diabetes? Where to find more information American Diabetes Association (ADA): diabetes.org Association of Diabetes Care and Education Specialists: diabeteseducator.org Contact a health care provider if: Your blood sugar is at or above 240 mg/dL (40.1 mmol/L) for 2 days in a row. You've been sick or have had a fever for 2 days or more, and you're not getting better. For more than 6 hours: You can't eat or drink. You feel nauseous. You throw up. You have diarrhea. Get help right away if: Your blood sugar is below 54 mg/dL (3 mmol/L). You get confused. You have trouble thinking clearly. You have trouble breathing. These symptoms may be an emergency. Get help right away. Call 911. Do not wait to see if the symptoms will go away. Do not drive yourself to the hospital. This information is not intended to replace advice given to you by your health care provider. Make sure you discuss any questions you have with your health care provider. Document Revised: 05/18/2022 Document Reviewed: 05/18/2022 Elsevier Patient Education  2024 [employer].

## 2023-06-04 ENCOUNTER — Ambulatory Visit: Admitting: Podiatry

## 2023-06-04 ENCOUNTER — Ambulatory Visit: Payer: Self-pay | Admitting: Primary Care

## 2023-06-04 ENCOUNTER — Encounter: Payer: Self-pay | Admitting: Primary Care

## 2023-06-04 ENCOUNTER — Ambulatory Visit (INDEPENDENT_AMBULATORY_CARE_PROVIDER_SITE_OTHER): Admitting: Primary Care

## 2023-06-04 VITALS — BP 136/78 | HR 53 | Temp 97.2°F | Ht 63.0 in | Wt 123.0 lb

## 2023-06-04 DIAGNOSIS — E11319 Type 2 diabetes mellitus with unspecified diabetic retinopathy without macular edema: Secondary | ICD-10-CM | POA: Diagnosis not present

## 2023-06-04 DIAGNOSIS — Z794 Long term (current) use of insulin: Secondary | ICD-10-CM | POA: Diagnosis not present

## 2023-06-04 LAB — POCT GLUCOSE FINGERSTICK: Glucose: 325 — AB (ref 70–99)

## 2023-06-04 NOTE — Assessment & Plan Note (Signed)
 Improving based on review of CGM, today's glucose reading in the office was 325. There are hardly any episodes of hypoglycemia on CGM.  Increase Lantus  to 35 units.  Follow up in 2 months.

## 2023-06-04 NOTE — Patient Instructions (Addendum)
 Increase Lantus  to 35 units once daily for diabetes.  Schedule a follow up visit for 2 months for diabetes check.  It was a pleasure to see you today!

## 2023-06-04 NOTE — Progress Notes (Signed)
 Subjective:    Patient ID: Laura Ferguson, female    DOB: 08-Aug-1932, 89 y.o.   MRN: [id number]  HPI  Laura Ferguson is a very pleasant 89 y.o. female with a history of CAD, hypertension, diabetes, anxiety depression, fatigue who presents today   Evaluated by Dr. Joelle Musca on 05/14/2023 for hyperglycemia.  Prior to this visit she was evaluated by me on 05/09/2023 where Lantus  was increased to 20 units daily due to A1C of 9.4.  During her visit with Dr. Joelle Musca she was unable to provide exact readings for her blood sugars, blood sugar that day in the office was 430, Lantus  was increased to 30 units daily.  Since her evaluation with Dr. Letvak she has increased her Lantus  to 30 units daily. She is checking her glucose levels continuously with her Freestyle Libre, sugars have improved. She's feeling a lot better, less stressed.   Her glucose time in range over the last 7 days is 50%, time in range over last 14 days is 41%, time in range over last 30 days is 34%. Average glucose over the last 7-30 days is mid 200s. She's had 2% of glucose levels with hypoglycemia episodes within 30 days. She is working to improve her diet, has increased salt intake recently.   Wt Readings from Last 3 Encounters:  06/04/23 123 lb (55.8 kg)  05/30/23 114 lb (51.7 kg)  05/22/23 114 lb (51.7 kg)      Review of Systems  Respiratory:  Negative for shortness of breath.   Cardiovascular:  Negative for chest pain.  Endocrine: Negative for polydipsia and polyuria.  Neurological:  Negative for dizziness.         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  Substance and Sexual Activity   Alcohol use: No    Alcohol/week: 0.0 standard drinks of alcohol   Drug use: No   Sexual activity: Not on file  Other Topics Concern   Not on file  Social History Narrative   Married.   Retired.   Works as a Engineer, structural.    Enjoys helping other.    Social Drivers of Corporate investment banker Strain: Low Risk  (01/22/2023)   Overall Financial Resource Strain (CARDIA)    Difficulty of Paying Living Expenses: Not hard at all  Food Insecurity: No Food Insecurity (05/30/2023)   Hunger Vital Sign    Worried About Running Out of Food in the Last Year: Never  true    Ran Out of Food in the Last Year: Never true  Transportation Needs: No Transportation Needs (05/30/2023)   PRAPARE - Administrator, Civil Service (Medical): No    Lack of Transportation (Non-Medical): No  Physical Activity: Inactive (01/22/2023)   Exercise Vital Sign    Days of Exercise per Week: 0 days    Minutes of Exercise per Session: 0 min  Stress: No Stress Concern Present (01/22/2023)   [employer] of Occupational Health - Occupational Stress Questionnaire    Feeling of Stress : Only a little  Social Connections: Moderately Isolated (01/22/2023)   Social Connection and Isolation Panel [NHANES]    Frequency of Communication with Friends and Family: More than three times a week    Frequency of Social Gatherings with Friends and Family: Once a week    Attends Religious Services: More than 4  times per year    Active Member of [employer] or Organizations: No    Attends Banker Meetings: Never    Marital Status: Widowed  Intimate Partner Violence: Not At Risk (05/30/2023)   Humiliation, Afraid, Rape, and Kick questionnaire    Fear of Current or Ex-Partner: No    Emotionally Abused: No    Physically Abused: No    Sexually Abused: No    Past Surgical History:  Procedure Laterality Date   CARDIAC CATHETERIZATION  05/07/2007   EF 55%, focal mild hypocontractility in mid-distal inferior wall & mid posterolateral wall; severe multivessel CAD - susequent CABGx6 (Dr. Electa Grieve)   CORONARY ARTERY BYPASS GRAFT  05/09/2007   LIMA to LAD, veing to intermediate; SVG to OM1 & OM2; SVG to acute marginal & distal RCA (Dr. Luna Salinas)   TRANSTHORACIC ECHOCARDIOGRAM  02/13/2010   EF =>55%, vigorous contraction EF 65%; LA mild-mod dilated; IV normal diameter - normal CVP; trace MR; mild TR; trace AV regurg    Family History  Problem Relation Age of Onset   Cancer Mother    Diabetes Sister    Cancer Child     No Known Allergies  Current Outpatient Medications on File Prior to Visit  Medication Sig Dispense Refill   Acetaminophen (TYLENOL 8 HOUR PO) Take 1 tablet by mouth every 8 (eight) hours as needed.      aspirin 81 MG tablet Take 81 mg by mouth daily.     Blood Glucose Monitoring Suppl (ACCU-CHEK GUIDE) w/Device KIT Use as instructed to check blood sugar. 1 kit 0   Cholecalciferol (VITAMIN D) 50 MCG (2000 UT) CAPS Take 1 capsule by mouth daily.     Continuous Glucose Sensor (FREESTYLE LIBRE 3 PLUS SENSOR) MISC Change sensor every 15 days. 6 each 3   Continuous Glucose Sensor (FREESTYLE LIBRE 3 SENSOR) MISC APPLY SENSOR EVERY 14 DAYS TO CONTINOUSLY MONITOR GLUCOSE 6 each 1   cyclobenzaprine  (FLEXERIL ) 5 MG tablet Take 1 tablet (5 mg total) by mouth at bedtime as needed for muscle spasms. 30 tablet 0   ezetimibe  (ZETIA ) 10 MG tablet Take 1 tablet (10 mg total) by mouth daily. 90  tablet 2   fexofenadine (ALLEGRA) 180 MG tablet Take 180 mg by mouth daily.     glucose blood (ACCU-CHEK GUIDE) test strip USE TO CHECK SUGAR THREE TIMES DAILY AS DIRECTED 250 each 3   insulin  glargine (LANTUS  SOLOSTAR) 100 UNIT/ML Solostar Pen Inject 30 Units into the skin daily. for diabetes. 1 mL 0   Insulin  Pen Needle (BD PEN NEEDLE NANO 2ND GEN) 32G X  4 MM MISC USE 1 PEN NEEDLE WITH INSULIN  PEN once daily 100 each 3   losartan  (COZAAR ) 100 MG tablet Take 1 tablet (100 mg total) by mouth daily. 90 tablet 1   metoprolol  succinate (TOPROL -XL) 50 MG 24 hr tablet TAKE 1 TABLET BY MOUTH IN THE MORNING AND AT BEDTIME TAKE  WITH  OR  IMMEDIATELY  FOLLOWING  A  MEAL 180 tablet 3   mometasone (NASONEX) 50 MCG/ACT nasal spray Place 2 sprays into the nose daily.     Multiple Vitamin (MULTIVITAMIN) capsule Take 1 capsule by mouth daily.     pantoprazole  (PROTONIX ) 40 MG tablet Take 1 tablet by mouth once daily 90 tablet 0   Polyethyl Glycol-Propyl Glycol (SYSTANE OP) Apply 2 drops to eye 2 (two) times daily.      rosuvastatin  (CRESTOR ) 10 MG tablet Take 1 tablet (10 mg total) by mouth daily. for cholesterol. 90 tablet 1   sertraline  (ZOLOFT ) 25 MG tablet Take 1 tablet (25 mg total) by mouth daily. for anxiety and depression. 90 tablet 0   TRAVATAN Z 0.004 % SOLN ophthalmic solution Place 1 drop into both eyes at bedtime.     Azelastine HCl 137 MCG/SPRAY SOLN As needed (Patient not taking: Reported on 06/04/2023)     No current facility-administered medications on file prior to visit.    BP 136/78   Pulse (!) 53   Temp (!) 97.2 F (36.2 C) (Temporal)   Ht 5\' 3"  (1.6 m)   Wt 123 lb (55.8 kg)   SpO2 99%   BMI 21.79 kg/m  Objective:   Physical Exam Cardiovascular:     Rate and Rhythm: Normal rate and regular rhythm.  Pulmonary:     Effort: Pulmonary effort is normal.     Breath sounds: Normal breath sounds.  Musculoskeletal:     Cervical back: Neck supple.  Skin:    General: Skin is warm and  dry.  Neurological:     Mental Status: She is alert and oriented to person, place, and time.  Psychiatric:        Mood and Affect: Mood normal.           Assessment & Plan:  Type 2 diabetes mellitus with retinopathy, with long-term current use of insulin , macular edema presence unspecified, unspecified laterality, unspecified retinopathy severity (HCC) Assessment & Plan: Improving based on review of CGM, today's glucose reading in the office was 325. There are hardly any episodes of hypoglycemia on CGM.  Increase Lantus  to 35 units.  Follow up in 2 months.     Orders: -     POCT Glucose Fingerstick        Gabriel John, NP

## 2023-06-06 ENCOUNTER — Telehealth: Payer: Self-pay

## 2023-06-06 NOTE — Progress Notes (Signed)
 Care Guide Pharmacy Note  06/06/2023 Name: [name] MRN: [id number] DOB: [birth year]  Referred By: Gabriel John, NP Reason for referral: Complex Care Management (Outreach to schedule with Pharm d )   Laura Ferguson is a 89 y.o. year old female who is a primary care patient of Gabriel John, NP.  [name] was referred to the pharmacist for assistance related to: DMII  Successful contact was made with the patient to discuss pharmacy services including being ready for the pharmacist to call at least 5 minutes before the scheduled appointment time and to have medication bottles and any blood pressure readings ready for review. The patient agreed to meet with the pharmacist via Face to Face  on (date/time).06/14/2023  Lenton Rail , RMA     [place]  [facility], [facility] Guide  Direct Dial: [phone]  Website: [email or web address]

## 2023-06-06 NOTE — Progress Notes (Signed)
 Care Guide Pharmacy Note  06/06/2023 Name: Laura Ferguson MRN: [id number] DOB: [birth year]  Referred By: Gabriel John, NP Reason for referral: Complex Care Management (Outreach to schedule with Pharm d )   Laura Ferguson is a 89 y.o. year old female who is a primary care patient of Gabriel John, NP.  Laura Ferguson was referred to the pharmacist for assistance related to: DMII  An unsuccessful telephone outreach was attempted today to contact the patient who was referred to the pharmacy team for assistance with medication assistance. Additional attempts will be made to contact the patient.  Lenton Rail , RMA     [facility]  [facility], [facility] Guide  Direct Dial: [phone]  Website: [email or web address]

## 2023-06-11 DIAGNOSIS — H35373 Puckering of macula, bilateral: Secondary | ICD-10-CM | POA: Diagnosis not present

## 2023-06-11 DIAGNOSIS — H43813 Vitreous degeneration, bilateral: Secondary | ICD-10-CM | POA: Diagnosis not present

## 2023-06-11 DIAGNOSIS — Z961 Presence of intraocular lens: Secondary | ICD-10-CM | POA: Diagnosis not present

## 2023-06-11 DIAGNOSIS — H401132 Primary open-angle glaucoma, bilateral, moderate stage: Secondary | ICD-10-CM | POA: Diagnosis not present

## 2023-06-11 DIAGNOSIS — H35033 Hypertensive retinopathy, bilateral: Secondary | ICD-10-CM | POA: Diagnosis not present

## 2023-06-11 DIAGNOSIS — E113313 Type 2 diabetes mellitus with moderate nonproliferative diabetic retinopathy with macular edema, bilateral: Secondary | ICD-10-CM | POA: Diagnosis not present

## 2023-06-13 DIAGNOSIS — H6123 Impacted cerumen, bilateral: Secondary | ICD-10-CM | POA: Diagnosis not present

## 2023-06-13 DIAGNOSIS — R42 Dizziness and giddiness: Secondary | ICD-10-CM | POA: Diagnosis not present

## 2023-06-13 DIAGNOSIS — R1314 Dysphagia, pharyngoesophageal phase: Secondary | ICD-10-CM | POA: Diagnosis not present

## 2023-06-13 DIAGNOSIS — J301 Allergic rhinitis due to pollen: Secondary | ICD-10-CM | POA: Diagnosis not present

## 2023-06-14 ENCOUNTER — Ambulatory Visit

## 2023-06-17 ENCOUNTER — Telehealth: Payer: Self-pay

## 2023-06-17 DIAGNOSIS — H35033 Hypertensive retinopathy, bilateral: Secondary | ICD-10-CM | POA: Diagnosis not present

## 2023-06-17 DIAGNOSIS — H35373 Puckering of macula, bilateral: Secondary | ICD-10-CM | POA: Diagnosis not present

## 2023-06-17 DIAGNOSIS — H401132 Primary open-angle glaucoma, bilateral, moderate stage: Secondary | ICD-10-CM | POA: Diagnosis not present

## 2023-06-17 DIAGNOSIS — Z961 Presence of intraocular lens: Secondary | ICD-10-CM | POA: Diagnosis not present

## 2023-06-17 DIAGNOSIS — H43813 Vitreous degeneration, bilateral: Secondary | ICD-10-CM | POA: Diagnosis not present

## 2023-06-17 DIAGNOSIS — E113311 Type 2 diabetes mellitus with moderate nonproliferative diabetic retinopathy with macular edema, right eye: Secondary | ICD-10-CM | POA: Diagnosis not present

## 2023-06-17 NOTE — Telephone Encounter (Signed)
 Patient called in and changed her mind, she will attend appt on 06/19/23. Can disregard previous message.

## 2023-06-17 NOTE — Telephone Encounter (Signed)
 Copied from CRM [id number]. Topic: General - Other >> Jun 17, 2023  3:08 PM Laura Ferguson wrote: Reason for CRM: Patient is wanting to speak with a nurse regarding her upcoming appointment with the pharmacist on 06/19/2023.  [phone]

## 2023-06-17 NOTE — Telephone Encounter (Signed)
 Patient needs to change appt with pharmacy team for tomorrow. Requesting a callback to reschedule.

## 2023-06-18 ENCOUNTER — Other Ambulatory Visit: Payer: Self-pay | Admitting: *Deleted

## 2023-06-19 ENCOUNTER — Ambulatory Visit (INDEPENDENT_AMBULATORY_CARE_PROVIDER_SITE_OTHER): Admitting: Pharmacist

## 2023-06-19 ENCOUNTER — Other Ambulatory Visit: Payer: Self-pay | Admitting: Otolaryngology

## 2023-06-19 DIAGNOSIS — K219 Gastro-esophageal reflux disease without esophagitis: Secondary | ICD-10-CM

## 2023-06-19 DIAGNOSIS — E11319 Type 2 diabetes mellitus with unspecified diabetic retinopathy without macular edema: Secondary | ICD-10-CM

## 2023-06-19 DIAGNOSIS — R1310 Dysphagia, unspecified: Secondary | ICD-10-CM

## 2023-06-19 DIAGNOSIS — Z794 Long term (current) use of insulin: Secondary | ICD-10-CM

## 2023-06-19 NOTE — Patient Outreach (Signed)
 Complex Care Management   Visit Note  06/19/2023 late entry  Name:  Laura Ferguson MRN: [id number] DOB: Jun 03, 1932  Situation: Referral received for Complex Care Management related to Mental/Behavioral Health diagnosis Grief and Loss. I obtained verbal consent from Patient.  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   LIMA to LAD, SVG to ramus intermedius, SVG to OM1 & OM2, SVG to acute marginal, SVG to distal RCA   Type 2 diabetes mellitus (HCC)     Assessment: Patient Reported Symptoms:  Cognitive Cognitive Status: Alert and oriented to person, place, and time, Insightful and able to interpret abstract concepts, Normal speech and language skills Cognitive/Intellectual Conditions Management [RPT]: None reported or documented in medical history or problem list      Neurological Neurological Review of Symptoms: Dizziness Neurological Conditions:  (hx of vertigo) Neurological Management Strategies: Routine screening Neurological Self-Management Outcome: 4 (good)  HEENT HEENT Symptoms Reported: Not assessed      Cardiovascular Cardiovascular Symptoms Reported: No symptoms reported    Respiratory Respiratory Symptoms Reported: No symptoms reported    Endocrine Patient reports the following symptoms related to hypoglycemia or hyperglycemia : No symptoms reported Is patient diabetic?: Yes Is patient checking blood sugars at home?: Yes Endocrine Conditions: Diabetes Endocrine Management Strategies: Routine screening, Medication therapy Endocrine  Self-Management Outcome: 3 (uncertain)  Gastrointestinal Gastrointestinal Symptoms Reported: Not assessed      Genitourinary Genitourinary Symptoms Reported: No symptoms reported    Integumentary Integumentary Symptoms Reported: No symptoms reported    Musculoskeletal Musculoskelatal Symptoms Reviewed: No symptoms reported        Psychosocial Psychosocial Symptoms Reported: Depression - if selected complete PHQ 2-9 Additional Psychological Details: Patient reports loss of 5 sons and 1 daughter over the last 5-6 years. Patient also discussed difficulty managing her medical conditions Behavioral Health Conditions: Depression Behavioral Management Strategies: Medication therapy, Support system Major Change/Loss/Stressor/Fears (CP): Medical condition, self, Death of a loved one Behaviors When Feeling Stressed/Fearful: tearful at times, church family supportive Techniques to [employer] with Loss/Stress/Change: Diversional activities, Medication, Withdraw Quality of Family Relationships: supportive, helpful, involved Do you feel physically threatened by others?: No      06/18/2023    1:01 PM  Depression screen PHQ 2/9  Decreased Interest 2  Down, Depressed, Hopeless 3  PHQ - 2 Score 5  Altered sleeping 1  Tired, decreased energy 0  Change in appetite 1  Feeling bad or failure about yourself  3  Trouble concentrating 0  Moving slowly or fidgety/restless 1  Suicidal thoughts 0  PHQ-9 Score 11  Difficult doing work/chores Somewhat difficult      05/09/2023    9:08 AM 07/03/2022   12:06 PM 03/02/2020   12:38 PM  GAD 7 : Generalized Anxiety Score  Nervous, Anxious, on Edge 3 2 2   Control/stop worrying 3 3 3   Worry too much - different things 3 3 3   Trouble relaxing 3 3 2   Restless 3 3 2   Easily annoyed or irritable 3 3 0  Afraid - awful might happen 3 3 1   Total GAD 7 Score 21 20 13   Anxiety Difficulty Very difficult Somewhat difficult Not difficult at all      There were no  vitals filed for this visit.  Medications Reviewed Today     Reviewed by Ave Leisure, LCSW (Social Worker) on 06/19/23 at [phone]  Med List Status: <None>   Medication Order Taking? Sig Documenting Provider Last Dose Status Informant  Acetaminophen (TYLENOL 8 HOUR PO) [id number] Yes Take 1 tablet by mouth every 8 (eight) hours as needed.  [provider] Taking Active Self  aspirin 81 MG tablet [id number] Yes Take 81 mg by mouth daily. [provider] Taking Active Self  Azelastine HCl 137 MCG/SPRAY SOLN [id number] No As needed  Patient not taking: Reported on 06/18/2023   [provider] Not Taking Active   Blood Glucose Monitoring Suppl (ACCU-CHEK GUIDE) w/Device KIT [id number] Yes Use as instructed to check blood sugar. Emilie Harden, MD Taking Active   Cholecalciferol (VITAMIN D) 50 MCG (2000 UT) CAPS [id number] Yes Take 1 capsule by mouth daily. [provider] Taking Active   Continuous Glucose Sensor (FREESTYLE LIBRE 3 PLUS SENSOR) MISC [id number] Yes Change sensor every 15 days. Gabriel John, NP Taking Active   Continuous Glucose Sensor (FREESTYLE LIBRE 3 SENSOR) [place] [id number] Yes APPLY SENSOR EVERY 14 DAYS TO CONTINOUSLY MONITOR GLUCOSE Clark, Katherine K, NP Taking Active   cyclobenzaprine  (FLEXERIL ) 5 MG tablet [id number] Yes Take 1 tablet (5 mg total) by mouth at bedtime as needed for muscle spasms. Gabriel John, NP Taking Active   ezetimibe  (ZETIA ) 10 MG tablet [id number] Yes Take 1 tablet (10 mg total) by mouth daily. Millicent Ally, MD Taking Active   fexofenadine (ALLEGRA) 180 MG tablet [id number] Yes Take 180 mg by mouth daily. [provider] Taking Active   glucose blood (ACCU-CHEK GUIDE) test strip [id number] Yes USE TO CHECK SUGAR THREE TIMES DAILY AS DIRECTED Emilie Harden, MD Taking Active   insulin  glargine (LANTUS  SOLOSTAR) 100 UNIT/ML Solostar Pen [id number] Yes Inject 30 Units into the skin daily. for diabetes.  Helaine Llanos, MD Taking Active   Insulin  Pen Needle (BD PEN NEEDLE NANO 2ND GEN) 32G X 4 MM MISC [id number] Yes USE 1 PEN NEEDLE WITH INSULIN  PEN once daily Clark, Katherine K, NP Taking Active   losartan  (COZAAR ) 100 MG tablet [id number] Yes Take 1 tablet (100 mg total) by mouth daily. Millicent Ally, MD Taking Active   metoprolol  succinate (TOPROL -XL) 50 MG 24 hr tablet [id number] Yes TAKE 1 TABLET BY MOUTH IN THE MORNING AND AT BEDTIME TAKE  WITH  OR  IMMEDIATELY  FOLLOWING  A  MEAL Millicent Ally, MD Taking Active   mometasone (NASONEX) 50 MCG/ACT nasal spray [id number] Yes Place 2 sprays into the nose daily. [provider] Taking Active   Multiple Vitamin (MULTIVITAMIN) capsule [id number] Yes Take 1 capsule by mouth daily. [provider] Taking Active Self  pantoprazole  (PROTONIX ) 40 MG tablet [id number] Yes Take 1 tablet by mouth once daily Millicent Ally, MD Taking Active   Polyethyl Glycol-Propyl Glycol (SYSTANE OP) [id number] Yes Apply 2 drops to eye 2 (two) times daily.  [provider] Taking Active Self  rosuvastatin  (CRESTOR ) 10 MG tablet [id number] Yes Take 1 tablet (10 mg total) by mouth daily. for cholesterol. Millicent Ally, MD Taking Active   sertraline  (ZOLOFT ) 25 MG tablet [id number] Yes Take 1 tablet (25 mg total) by  mouth daily. for anxiety and depression. Gabriel John, NP Taking Active   TRAVATAN Z 0.004 % SOLN ophthalmic solution [id number] Yes Place 1 drop into both eyes at bedtime. [provider] Taking Active Self           Med Note Caroline Cinnamon, Wilton Hasting Dec 05, 2014  2:09 PM)              Recommendation:   PCP Follow-up Continue to consider participation in Grief Counseling   Follow Up Plan:   Telephone follow up appointment date/time:  07/02/23 9am  Victoriano Campion, LCSW [place]  [facility], [facility] Licensed Clinical Social Worker Care Coordinator  Direct Dial: [phone]

## 2023-06-19 NOTE — Patient Instructions (Addendum)
 Visit Information  Thank you for taking time to visit with me today. Please don't hesitate to contact me if I can be of assistance to you before our next scheduled appointment.  Our next appointment is by telephone on 07/02/23 at 9am Please call the care guide team at [phone] if you need to cancel or reschedule your appointment.   Following is a copy of your care plan:   Goals Addressed             This Visit's Progress    VBCI Social Work Care Plan       Problems:   Disease Management support and education needs related to Depression: depressed mood, hopelessness, and suicidal thoughts without plan and Grief  CSW Clinical Goal(s):   Over the next 90 days the Patient will demonstrate a reduction in symptoms related to Grief .  Interventions:  Mental Health:  Evaluation of current treatment plan related to Depression: depressed mood, hopelessness, and Grief Active listening / Reflection utilized Behavioral Activation reviewed Emotional Support Provided related to multiple losses in her life and discussed coping strategies to combat symptoms of depression Motivational Interviewing employed PHQ2/PHQ9 completed GAD 7 completed Suicidal Ideation/Homicidal Ideation assessed: patient confirmed thoughts of hopelessness denies plan of self harm or harms due to her strong spiritual beliefs Discussed plan of contacting 48 in the event of a mental health crisis  Patient Goals/Self-Care Activities:  Patient will consider participation in Grief Counseling             Patient to continue plan to prioritize self care Plan:   Telephone follow up appointment with care management team member scheduled for:  07/02/23        Please call the Suicide and Crisis Lifeline: 988 if you are experiencing a Mental Health or Behavioral Health Crisis or need someone to talk to.  Patient verbalizes understanding of instructions and care plan provided today and agrees to view in MyChart. Active  MyChart status and patient understanding of how to access instructions and care plan via MyChart confirmed with patient.     Raydel Hosick, LCSW [place]  [facility], [facility] Licensed Clinical Social Worker Care Coordinator  Direct Dial: [phone]

## 2023-06-19 NOTE — Progress Notes (Unsigned)
 06/20/2023 Name: Laura Ferguson MRN: [id number] DOB: 03-17-1932  Subjective  Chief Complaint  Patient presents with   Diabetes    Reason for visit: ?  Laura Ferguson is a 89 y.o. female with a history of diabetes (type 2), who presents today for an initial visit related to CGM setup.  Care Team: Primary Care Provider: Clark, Katherine K, NP  Medication Access/Adherence: Prescription drug coverage: Payor: [employer] MEDICARE / Plan: [employer] MEDICARE HMO / Product Type: *No Product type* / .  - Reports that all medications are affordable. Most expensive is Libre sensors at ~$74/month  Since Last visit / History of Present Illness: ?  Today patient had brought in 3 different [place] reader devices. One [place], and [place], one of which is visibly broken. All 3 readers are dead/uncharged. Patient is currently wearing an expired Libre 3 sensor on her arm, though notes she has not successfully used her Libre reader in at least a few weeks.   Upon charging [employer] reader, the date is correctly set to today. Though, When plugged in/downloaded, report shows future dates so it is not clear as to when this data is from.   Confirms adequate testing supplies at home/functioning glucometer.   Reported DM Regimen: ?  Lantus  30 units daily      Reported Diet: Patient typically eats 2 meals per day.  Breakfast: Does not like to have breakfast. Sometimes eggs or cheese and toast Lunch: Keto bread sandwich  Dinner: Potaties, spinach, meat (chicken wings)  Bedtime = Midnight Wakes around 7 am      _______________________________________________  Objective    Vitals:  Wt Readings from Last 3 Encounters:  06/04/23 123 lb (55.8 kg)  05/30/23 114 lb (51.7 kg)  05/22/23 114 lb (51.7 kg)   BP Readings from Last 3 Encounters:  06/04/23 136/78  05/22/23 114/68  05/14/23 110/70   Pulse Readings from Last 3 Encounters:  06/04/23 (!) 53  05/22/23 (!) 56  05/14/23 66     Labs:?  Lab Results   Component Value Date   HGBA1C 9.4 (A) 05/09/2023   HGBA1C 8.3 (A) 02/08/2023   HGBA1C 7.9 (A) 09/21/2022   GLUCOSE 325 (A) 06/04/2023   MICRALBCREAT 4.4 07/03/2022   MICRALBCREAT 10.9 06/27/2015   CREATININE 1.32 (H) 05/22/2023   CREATININE 1.60 (H) 09/21/2022   CREATININE 1.52 (H) 09/21/2022   GFR 29.59 (L) 07/03/2022   GFR 36.03 (L) 09/28/2021   GFR 33.42 (L) 04/06/2021    Lab Results  Component Value Date   CHOL 126 05/22/2023   LDLCALC 42 05/22/2023   LDLCALC 39 02/02/2022   LDLCALC 104 (H) 04/06/2021   HDL 71 05/22/2023   TRIG 58 05/22/2023   TRIG 63 02/02/2022   TRIG 121.0 04/06/2021   ALT 28 05/22/2023   ALT 38 (H) 09/21/2022   AST 26 05/22/2023   AST 29 09/21/2022      Chemistry      Component Value Date/Time   NA 141 05/22/2023 1119   K 5.4 (H) 05/22/2023 1119   CL 105 05/22/2023 1119   CO2 18 (L) 05/22/2023 1119   BUN 31 (H) 05/22/2023 1119   CREATININE 1.32 (H) 05/22/2023 1119   CREATININE 1.52 (H) 09/21/2022 1642      Component Value Date/Time   CALCIUM  9.7 05/22/2023 1119   ALKPHOS 91 05/22/2023 1119   AST 26 05/22/2023 1119   ALT 28 05/22/2023 1119   BILITOT 0.5 05/22/2023 1119  The ASCVD Risk score (Arnett DK, et al., 2019) failed to calculate for the following reasons:   The 2019 ASCVD risk score is only valid for ages 1 to 50  Assessment and Plan:   1. Diabetes, type 2: uncontrolled per last A1c of 9.4% (03/11/23), increased from previous 8.3.%. Glycemic patterns unclear given recent confusion with her CGM device(s) though concern present for hyper and hypoglycemia per patient report today and at previous PCP OV. Patient agreeable to discarding visibly broken Libre3 reader today to avoid confusion. Working Freight [employer] reader was charged, sensor was placed in clinic and was connected successfully to her reader.  Continue medications today without changes.  Patient provided with direct callback# if any concerns or issues with current Libre 3  sensor.   Reviewed s/sx/tx hypoglycemia   Follow Up Patient given direct line for questions regarding her [facility] device   Future Appointments  Date Time Provider Department Center  07/02/2023  9:00 AM Land, [place] M, [place] [facility] None  07/04/2023 11:00 AM Green, Davina E, RN [facility] None  07/05/2023  1:00 PM [facility]  08/06/2023  2:00 PM Gabriel John, NP [facility]  09/23/2023  8:15 AM Luella Sager, DPM [facility]  01/23/2024 11:30 AM [facility] ANNUAL WELLNESS VISIT 1 [facility]    Daron Ellen, PharmD Clinical Pharmacist [facility] [phone]

## 2023-06-20 ENCOUNTER — Ambulatory Visit: Admitting: Podiatry

## 2023-06-20 DIAGNOSIS — E11319 Type 2 diabetes mellitus with unspecified diabetic retinopathy without macular edema: Secondary | ICD-10-CM

## 2023-06-20 DIAGNOSIS — Z794 Long term (current) use of insulin: Secondary | ICD-10-CM | POA: Diagnosis not present

## 2023-06-20 DIAGNOSIS — E119 Type 2 diabetes mellitus without complications: Secondary | ICD-10-CM

## 2023-06-20 NOTE — Progress Notes (Signed)
 Subjective: Laura Ferguson presents today referred by Gabriel John, NP for diabetic foot evaluation.  Patient relates many year history of diabetes.  Patient denies any history of foot wounds.  Patient denies any history of numbness, tingling, burning, pins/needles sensations.  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   LIMA to LAD, SVG to ramus intermedius, SVG to OM1 & OM2, SVG to acute marginal, SVG to distal RCA   Type 2 diabetes mellitus (HCC)     Patient Active Problem List   Diagnosis Date Noted   Vertigo 03/18/2023   Right lower quadrant abdominal pain 09/21/2022   Pelvic pain 09/21/2022   Uterine fibroid 07/03/2022   GERD (gastroesophageal reflux disease) 07/03/2022   Myalgia 08/04/2020   Fatigue 03/02/2020   Preventative health care 10/13/2019   Other social stressor 08/31/2019   Type 2 diabetes mellitus with retinopathy (HCC) 01/29/2018   Chronic back pain 11/12/2017   Anxiety and depression 10/16/2017   Ankle swelling 06/01/2015   Dizziness 03/26/2015   CAD (coronary artery disease) 12/29/2012   HTN (hypertension) 12/29/2012   Hyperlipidemia 12/29/2012    Past Surgical History:  Procedure Laterality Date   CARDIAC CATHETERIZATION  05/07/2007   EF 55%, focal mild hypocontractility in mid-distal inferior wall & mid posterolateral wall; severe multivessel CAD - susequent CABGx6 (Dr. Electa Grieve)   CORONARY ARTERY BYPASS GRAFT  05/09/2007   LIMA to LAD, veing to intermediate; SVG to OM1 & OM2; SVG to acute marginal & distal RCA (Dr. Luna Salinas)   TRANSTHORACIC ECHOCARDIOGRAM  02/13/2010   EF =>55%,  vigorous contraction EF 65%; LA mild-mod dilated; IV normal diameter - normal CVP; trace MR; mild TR; trace AV regurg    Current Outpatient Medications on File Prior to Visit  Medication Sig Dispense Refill   Acetaminophen (TYLENOL 8 HOUR PO) Take 1 tablet by mouth every 8 (eight) hours as needed.      aspirin 81 MG tablet Take 81 mg by mouth daily.     Azelastine HCl 137 MCG/SPRAY SOLN As needed (Patient not taking: Reported on 06/18/2023)     Blood Glucose Monitoring Suppl (ACCU-CHEK GUIDE) w/Device KIT Use as instructed to check blood sugar. 1 kit 0   Cholecalciferol (VITAMIN D) 50 MCG (2000 UT) CAPS Take 1 capsule by mouth daily.     Continuous Glucose Sensor (FREESTYLE LIBRE 3 PLUS SENSOR) MISC Change sensor every 15 days. 6 each 3   Continuous Glucose Sensor (FREESTYLE LIBRE 3 SENSOR) MISC APPLY SENSOR EVERY 14 DAYS TO CONTINOUSLY MONITOR GLUCOSE 6 each 1   cyclobenzaprine  (FLEXERIL ) 5 MG tablet Take 1 tablet (5 mg total) by mouth at bedtime as needed for muscle spasms. 30 tablet 0   ezetimibe  (ZETIA ) 10 MG tablet Take 1 tablet (10 mg total) by mouth daily. 90 tablet 2   fexofenadine (ALLEGRA) 180 MG tablet Take 180 mg by mouth daily.     glucose blood (ACCU-CHEK GUIDE) test strip USE TO CHECK SUGAR THREE TIMES DAILY AS DIRECTED 250 each 3   insulin  glargine (LANTUS  SOLOSTAR) 100 UNIT/ML Solostar Pen Inject 30 Units into  the skin daily. for diabetes. 1 mL 0   Insulin  Pen Needle (BD PEN NEEDLE NANO 2ND GEN) 32G X 4 MM MISC USE 1 PEN NEEDLE WITH INSULIN  PEN once daily 100 each 3   losartan  (COZAAR ) 100 MG tablet Take 1 tablet (100 mg total) by mouth daily. 90 tablet 1   metoprolol  succinate (TOPROL -XL) 50 MG 24 hr tablet TAKE 1 TABLET BY MOUTH IN THE MORNING AND AT BEDTIME TAKE  WITH  OR  IMMEDIATELY  FOLLOWING  A  MEAL 180 tablet 3   mometasone (NASONEX) 50 MCG/ACT nasal spray Place 2 sprays into the nose daily.     Multiple Vitamin (MULTIVITAMIN) capsule Take 1 capsule by mouth daily.      pantoprazole  (PROTONIX ) 40 MG tablet Take 1 tablet by mouth once daily 90 tablet 0   Polyethyl Glycol-Propyl Glycol (SYSTANE OP) Apply 2 drops to eye 2 (two) times daily.      rosuvastatin  (CRESTOR ) 10 MG tablet Take 1 tablet (10 mg total) by mouth daily. for cholesterol. 90 tablet 1   sertraline  (ZOLOFT ) 25 MG tablet Take 1 tablet (25 mg total) by mouth daily. for anxiety and depression. 90 tablet 0   TRAVATAN Z 0.004 % SOLN ophthalmic solution Place 1 drop into both eyes at bedtime.     No current facility-administered medications on file prior to visit.     No Known Allergies  Social History   Occupational History   Not on file  Tobacco Use   Smoking status: Never   Smokeless tobacco: Never   Tobacco comments:    quit 1960's  smoked very lightly.  Substance and Sexual Activity   Alcohol use: No    Alcohol/week: 0.0 standard drinks of alcohol   Drug use: No   Sexual activity: Not on file    Family History  Problem Relation Age of Onset   Cancer Mother    Diabetes Sister    Cancer Child     Immunization History  Administered Date(s) Administered   Fluad Quad(high Dose 65+) 10/16/2018, 10/13/2019, 09/28/2021   Influenza,inj,Quad PF,6+ Mos 09/25/2017   PFIZER Comirnaty(Gray Top)Covid-19 Tri-Sucrose Vaccine 07/05/2020   PFIZER(Purple Top)SARS-COV-2 Vaccination 03/15/2019, 04/04/2019, 09/15/2019   Pfizer Covid-19 Vaccine Bivalent Booster 32yrs & up 01/02/2021   Pneumococcal Conjugate-13 10/13/2019   Pneumococcal Polysaccharide-23 10/12/2021    Review of systems: Positive Findings in bold print.  Constitutional:  chills, fatigue, fever, sweats, weight change Communication: Nurse, learning disability, sign Presenter, broadcasting, hand writing, iPad/Android device Head: headaches, head injury Eyes: changes in vision, eye pain, glaucoma, cataracts, macular degeneration, diplopia, glare,  light sensitivity, eyeglasses or contacts, blindness Ears nose mouth throat: hearing impaired, hearing  aids,  ringing in ears, deaf, sign language,  vertigo, nosebleeds,  rhinitis,  cold sores, snoring, swollen glands Cardiovascular: HTN, edema, arrhythmia, pacemaker in place, defibrillator in place, chest pain/tightness, chronic anticoagulation, blood clot, heart failure, MI Peripheral Vascular: leg cramps, varicose veins, blood clots, lymphedema, varicosities Respiratory:  asthma, difficulty breathing, denies congestion, SOB, wheezing, cough, emphysema Gastrointestinal: change in appetite or weight, abdominal pain, constipation, diarrhea, nausea, vomiting, vomiting blood, change in bowel habits, abdominal pain, jaundice, rectal bleeding, hemorrhoids, GERD Genitourinary:  nocturia,  pain on urination, polyuria,  blood in urine, Foley catheter, urinary urgency, ESRD on hemodialysis Musculoskeletal: amputation, cramping, stiff joints, painful joints, decreased joint motion, fractures, OA, gout, hemiplegia, paraplegia, uses cane, wheelchair bound, uses walker, uses rollator Skin: +changes in toenails, color change, dryness, itching, mole changes,  rash, wound(s) Neurological: headaches, numbness in  feet, paresthesias in feet, burning in feet, fainting,  seizures, change in speech, migraines, memory problems/poor historian, cerebral palsy, weakness, paralysis, CVA, TIA Endocrine: diabetes, hypothyroidism, hyperthyroidism,  goiter, dry mouth, flushing, heat intolerance, cold intolerance,  excessive thirst, denies polyuria,  nocturia Hematological:  easy bleeding, excessive bleeding, easy bruising, enlarged lymph nodes, on long term blood thinner, history of past transusions Allergy/immunological:  hives, eczema, frequent infections, multiple drug allergies, seasonal allergies, transplant recipient, multiple food allergies Psychiatric:  anxiety, depression, mood disorder, suicidal ideations, hallucinations, insomnia  Objective: There were no vitals filed for this visit. Vascular Examination: Capillary  refill time less than 3 seconds x 10 digits.  Dorsalis pedis pulses palpable 2 out of 4.  Posterior tibial pulses palpable 2 out of 4.  Digital hair not present x 10 digits.  Skin temperature gradient WNL b/l.  Dermatological Examination: Skin with normal turgor, texture and tone b/l  Toenails 1-5 b/l discolored, thick, dystrophic with subungual debris and pain with palpation to nailbeds due to thickness of nails.  Musculoskeletal: Muscle strength 5/5 to all LE muscle groups.  Neurological: Sensation intact with 10 gram monofilament.  Vibratory sensation intact.  Assessment: NIDDM Encounter for diabetic foot examination  Plan: Discussed diabetic foot care principles. Literature dispensed on today. Patient to continue soft, supportive shoe gear daily. Patient to report any pedal injuries to medical professional immediately. Follow up one year. Patient/POA to call should there be a concern in the interim.

## 2023-06-26 ENCOUNTER — Other Ambulatory Visit: Payer: Self-pay

## 2023-06-27 ENCOUNTER — Other Ambulatory Visit: Payer: Self-pay | Admitting: Primary Care

## 2023-06-27 DIAGNOSIS — E11319 Type 2 diabetes mellitus with unspecified diabetic retinopathy without macular edema: Secondary | ICD-10-CM

## 2023-06-28 ENCOUNTER — Other Ambulatory Visit: Payer: Self-pay | Admitting: Primary Care

## 2023-06-28 DIAGNOSIS — E119 Type 2 diabetes mellitus without complications: Secondary | ICD-10-CM

## 2023-06-28 DIAGNOSIS — E11319 Type 2 diabetes mellitus with unspecified diabetic retinopathy without macular edema: Secondary | ICD-10-CM

## 2023-06-28 MED ORDER — LANTUS SOLOSTAR 100 UNIT/ML ~~LOC~~ SOPN: 30.0000 [IU] | PEN_INJECTOR | Freq: Every day | SUBCUTANEOUS | 0 refills | Status: DC

## 2023-06-28 MED ORDER — FREESTYLE LIBRE 3 PLUS SENSOR MISC: 3 refills | Status: DC

## 2023-06-28 NOTE — Telephone Encounter (Unsigned)
 Copied from CRM [id number]. Topic: Clinical - Medication Refill >> Jun 28, 2023  1:49 PM Savanna F wrote: Medication: Continuous Glucose Sensor (FREESTYLE LIBRE 3 PLUS SENSOR) MISC,  insulin  glargine (LANTUS  SOLOSTAR) 100 UNIT/ML Solostar Pen  Has the patient contacted their pharmacy? Yes (Agent: If no, request that the patient contact the pharmacy for the refill. If patient does not wish to contact the pharmacy document the reason why and proceed with request.) (Agent: If yes, when and what did the pharmacy advise?)  This is the patient's preferred pharmacy:  [facility] [address] [place] - [address] [place] [address] Phone: [phone] Fax: [phone]  Is this the correct pharmacy for this prescription? Yes If no, delete pharmacy and type the correct one.   Has the prescription been filled recently? No  Is the patient out of the medication? Yes, she has been out of the insulin  and sensors since 06/27/2023  Has the patient been seen for an appointment in the last year OR does the patient have an upcoming appointment? Yes  Can we respond through MyChart? No, please cal her at [phone] when this is sent for her  Agent: Please be advised that Rx refills may take up to 3 business days. We ask that you follow-up with your pharmacy.

## 2023-07-01 ENCOUNTER — Telehealth: Payer: Self-pay

## 2023-07-01 NOTE — Telephone Encounter (Signed)
 Patient needs PA started for Lantus . Please submit.

## 2023-07-01 NOTE — Telephone Encounter (Signed)
 Copied from CRM [id number]. Topic: General - Other >> Jul 01, 2023  4:19 PM Albertha Alosa wrote: Reason for CRM: Karey Suthers called in wanting to speak with Nurse is asking for someone to give her a callback TODAY as it is very important regarding patients medication  [phone]

## 2023-07-02 ENCOUNTER — Other Ambulatory Visit (INDEPENDENT_AMBULATORY_CARE_PROVIDER_SITE_OTHER): Payer: Self-pay | Admitting: Pharmacist

## 2023-07-02 ENCOUNTER — Other Ambulatory Visit: Payer: Self-pay | Admitting: *Deleted

## 2023-07-02 ENCOUNTER — Other Ambulatory Visit: Payer: Self-pay | Admitting: Pharmacist

## 2023-07-02 DIAGNOSIS — E11319 Type 2 diabetes mellitus with unspecified diabetic retinopathy without macular edema: Secondary | ICD-10-CM

## 2023-07-02 DIAGNOSIS — Z794 Long term (current) use of insulin: Secondary | ICD-10-CM

## 2023-07-02 NOTE — Patient Instructions (Signed)
 Visit Information  Thank you for taking time to visit with me today. Please don't hesitate to contact me if I can be of assistance to you before our next scheduled appointment.  Your next care management appointment is by telephone on 07/16/23 at 11am  Telephone follow up appointment date/time:  07/16/23  Please call the care guide team at [phone] if you need to cancel, schedule, or reschedule an appointment.   Please call the Suicide and Crisis Lifeline: 988 if you are experiencing a Mental Health or Behavioral Health Crisis or need someone to talk to.  Manvir Thorson, LCSW [place]  [facility], [facility] Licensed Clinical Social Worker  Direct Dial: [phone]

## 2023-07-02 NOTE — Progress Notes (Signed)
 Brief Telephone Documentation Reason for Call: Patient left message regarding request  Summary of Call: Called primary number on file. Patient's daughter states she was inquiring about an insulin  refill for her mother. Pharmacy told her that she has no refills remaining.   Called Laura Ferguson's number (home) though reached her voicemail. Left message requesting call back when able to discuss her Laura Ferguson. Provided direct callback number on patient's voicemail.   Messaged PCP regarding insulin  refill request.   Daron Ellen, PharmD Clinical Pharmacist [facility] [phone]

## 2023-07-02 NOTE — Progress Notes (Signed)
 Brief Telephone Documentation Reason for Call: Patient returned call regarding Libre/insulin   Summary of Call: [facility] sensor has stayed on without issue over the past 2 weeks.  Reviewed Libre sensor data with patient via the phone which was somewhat challenging. Per patient report:  Average glucose (Last 7 Days): 232 mg/dL Average glucose by time of day:     12 am - 6 am: 202       6 am - 12 pm: 224      12 pm - 6 pm: 287      6 pm - 12 pm: 222  Low Glucose Events in the last 7 days = 24  Concerned that she may be using an old (opened) insulin  the past day or so. Reports when I have some left over, I don't throw it away, I save it.  Advised her to avoid using expired insulin , will msg PCP.   Follow Up: Patient given direct line for further questions/concerns.  Daron Ellen, PharmD Clinical Pharmacist [facility] [phone]

## 2023-07-02 NOTE — Patient Outreach (Signed)
 Complex Care Management   Visit Note  07/02/2023  Name:  Laura Ferguson MRN: [id number] DOB: [birth year]  Situation: Referral received for Complex Care Management related to Mental/Behavioral Health diagnosis Grief and loss I obtained verbal consent from Patient.  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   LIMA to LAD, SVG to ramus intermedius, SVG to OM1 & OM2, SVG to acute marginal, SVG to distal RCA   Type 2 diabetes mellitus (HCC)     Assessment: Patient Reported Symptoms:  Cognitive Cognitive Status: Alert and oriented to person, place, and time, Normal speech and language skills Cognitive/Intellectual Conditions Management [RPT]: None reported or documented in medical history or problem list      Neurological Neurological Review of Symptoms: No symptoms reported    HEENT HEENT Symptoms Reported: Other: Other HEENT Symptoms/Conditions: wax build up HEENT Management Strategies: Routine screening    Cardiovascular Cardiovascular Symptoms Reported: No symptoms reported    Respiratory Respiratory Symptoms Reported: No symptoms reported Respiratory Conditions: Seasonal allergies  Endocrine Patient reports the following symptoms related to hypoglycemia or hyperglycemia : No symptoms reported Is patient diabetic?: Yes Endocrine Conditions: Diabetes Endocrine Management Strategies: Routine screening, Medication therapy Endocrine Comment: Patient states that she is running out of insulin -last does today- plans to follow up with the pharmacist today CSW will  folllow up as well  Gastrointestinal Gastrointestinal Symptoms Reported: No symptoms reported      Genitourinary Genitourinary Symptoms Reported: No symptoms reported    Integumentary Integumentary Symptoms Reported: No symptoms reported    Musculoskeletal Musculoskelatal Symptoms Reviewed: No symptoms reported        Psychosocial Psychosocial Symptoms Reported: Depression - if selected complete PHQ 2-9 Additional Psychological Details: Continued difficulty managing losses in her life-mood has improved , sleep has improved, socialization/getting out more hs increased Behavioral Health Conditions: Depression Behavioral Management Strategies: Coping strategies, Medication therapy, Support system Behavioral Health Self-Management Outcome: 4 (good) Major Change/Loss/Stressor/Fears (CP): Medical condition, self Behaviors When Feeling Stressed/Fearful: withdraws, tearful Techniques to Cope with Loss/Stress/Change: Diversional activities, Medication, Spiritual practice(s), Withdraw Quality of Family Relationships: involved, supportive, helpful Do you feel physically threatened by others?: No      06/18/2023    1:01 PM  Depression screen PHQ 2/9  Decreased Interest 2  Down, Depressed, Hopeless 3  PHQ - 2 Score 5  Altered sleeping 1  Tired, decreased energy 0  Change in appetite 1  Feeling bad or failure about yourself  3  Trouble concentrating 0  Moving slowly or fidgety/restless 1  Suicidal thoughts 0  PHQ-9 Score 11  Difficult doing work/chores Somewhat difficult    Vitals:   07/02/23 0942  BP: 116/84  Pulse: 84    Medications Reviewed Today   Medications were not reviewed in this encounter     Recommendation:   PCP Follow-up Continue Current Plan of Care Contact providers office/pharmacist regarding medication re-fill  Follow Up Plan:   Telephone follow up appointment date/time:  07/16/23   Michaelle Adolphus, LCSW [place]  [facility],  Population  Health Licensed Acupuncturist: [phone]

## 2023-07-03 ENCOUNTER — Other Ambulatory Visit (HOSPITAL_COMMUNITY): Payer: Self-pay

## 2023-07-03 ENCOUNTER — Telehealth: Payer: Self-pay

## 2023-07-03 NOTE — Telephone Encounter (Signed)
 Pharmacy Patient Advocate Encounter   Received notification from Physician's Office that prior authorization for FreeStyle Libre 3 plus sensors is required/requested.   Insurance verification completed.   The patient is insured through [place] .   Per test claim: Refill too soon. PA is not needed at this time. Medication was filled 05/12/23 for a 90 day supply. Next eligible fill date is 07/26/23.

## 2023-07-03 NOTE — Telephone Encounter (Signed)
 Copied from CRM [id number]. Topic: General - Call Back - No Documentation >> Jul 02, 2023  1:53 PM Chuck Crater wrote: Reason for CRM: Patient is returning a call from the office. She stated that someone called her and the phone hung up.  I believe this CRM was sent to us  Product manager at [employer]) in error.  I believe the CRM was supposed to go to [employer] at [facility].  I spoke with patient and she states she spoke with someone yesterday and does not need to be transferred to [employer] at [facility] today.

## 2023-07-04 ENCOUNTER — Other Ambulatory Visit (HOSPITAL_COMMUNITY): Payer: Self-pay

## 2023-07-04 ENCOUNTER — Other Ambulatory Visit: Payer: Self-pay

## 2023-07-04 ENCOUNTER — Telehealth: Payer: Self-pay

## 2023-07-04 NOTE — Telephone Encounter (Signed)
 Pharmacy Patient Advocate Encounter   Received notification from Physician's Office that prior authorization for Lantus  solostar is required/requested.   Insurance verification completed.   The patient is insured through [place] .   Per test claim: Refill too soon. PA is not needed at this time. Medication was filled 07/02/23. Next eligible fill date is 07/25/23.

## 2023-07-04 NOTE — Patient Instructions (Signed)
 Visit Information  Thank you for taking time to visit with me today. Please don't hesitate to contact me if I can be of assistance to you before our next scheduled appointment.  Your next care management appointment is by telephone on 07/25/23 at 11 am  Telephone follow-up in 1 month with Adelia Adolphus, RN  Please call the care guide team at [phone] if you need to cancel, schedule, or reschedule an appointment.   Please call the Suicide and Crisis Lifeline: 988 call 1-800-273-TALK (toll free, 24 hour hotline) if you are experiencing a Mental Health or Behavioral Health Crisis or need someone to talk to.  Verba Girt RN, BSN, CCM [employer], Population Health Case Manager Phone: [phone]

## 2023-07-04 NOTE — Patient Outreach (Signed)
 Complex Care Management   Visit Note  07/04/2023  Name:  Laura Ferguson MRN: [id number] DOB: 1932/05/07  Situation: Referral received for Complex Care Management related to Diabetes/ depression I obtained verbal consent from Patient.  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   LIMA to LAD, SVG to ramus intermedius, SVG to OM1 & OM2, SVG to acute marginal, SVG to distal RCA   Type 2 diabetes mellitus (HCC)     Assessment: Patient Reported Symptoms:  Cognitive Cognitive Status: Alert and oriented to person, place, and time, Normal speech and language skills      Neurological Neurological Review of Symptoms: No symptoms reported    HEENT HEENT Symptoms Reported: No symptoms reported      Cardiovascular Cardiovascular Symptoms Reported: No symptoms reported    Respiratory Respiratory Symptoms Reported: No symptoms reported    Endocrine Patient reports the following symptoms related to hypoglycemia or hyperglycemia : No symptoms reported Is patient diabetic?: Yes Endocrine Conditions: Diabetes Endocrine Management Strategies: Routine screening, Medication therapy, Medical device, Diet modification Endocrine Comment: Patient states the sensor she is wearing currently is out. She states the reading says sensor ended. Patient reports she continues to have issues with her sensor running out to soon.  She states she has replaced the sensors frequently and now she doesn't have anymore. She states the practice pharmacist Llana Rile, has been following up with her  regarding her regarding her CGM. Patient states this continues to be very frustrating for her. Patient states her blood sugars range in the 200's.  She reports recent low of 62.  Patient states she is very concerned about going to sleep at night because she is afraid her blood sugars may drop.  Gastrointestinal Gastrointestinal Symptoms Reported: No symptoms reported      Genitourinary Genitourinary Symptoms Reported: No symptoms reported    Integumentary Integumentary Symptoms Reported: No symptoms reported    Musculoskeletal Musculoskelatal Symptoms Reviewed: No symptoms reported        Psychosocial Psychosocial Symptoms Reported: No symptoms reported Additional Psychological Details: Patient reports ongoing follow up with Chrystal Land, Child psychotherapist.  Reports her mood has improved.            06/18/2023    1:01 PM  Depression screen PHQ 2/9  Decreased Interest 2  Down, Depressed, Hopeless 3  PHQ - 2 Score 5  Altered sleeping 1  Tired, decreased energy 0  Change in appetite 1  Feeling bad or failure about yourself  3  Trouble concentrating 0  Moving slowly or fidgety/restless 1  Suicidal thoughts 0  PHQ-9 Score 11  Difficult doing work/chores Somewhat difficult    There were no vitals filed for this visit.  Medications Reviewed Today     Reviewed by Bernardette Waldron E, RN (Registered Nurse) on 07/04/23 at 1136  Med List Status: <None>   Medication Order Taking? Sig Documenting Provider Last Dose Status Informant  Acetaminophen (TYLENOL 8 HOUR PO) [id number] No Take 1 tablet by mouth every 8 (eight) hours as  needed.  [provider] Taking Active Self  aspirin 81 MG tablet [id number] No Take 81 mg by mouth daily. [provider] Taking Active Self  Azelastine HCl 137 MCG/SPRAY SOLN [id number] No As needed  Patient not taking: Reported on 06/18/2023   [provider] Not Taking Active   Blood Glucose Monitoring Suppl (ACCU-CHEK GUIDE) w/Device KIT  [id number] No Use as instructed to check blood sugar. Emilie Harden, MD Taking Active   Cholecalciferol (VITAMIN D) 50 MCG (2000 UT) CAPS [id number] No Take 1 capsule by mouth daily. [provider] Taking Active   Continuous Glucose Sensor (FREESTYLE LIBRE 3 PLUS SENSOR) MISC [id number]  Change sensor every 15 days. Gabriel John, NP  Active   Continuous Glucose Sensor (FREESTYLE LIBRE 3 SENSOR) [place] [id number] No APPLY SENSOR EVERY 14 DAYS TO CONTINOUSLY MONITOR GLUCOSE Gabriel John, NP Taking Active   cyclobenzaprine  (FLEXERIL ) 5 MG tablet [id number] No Take 1 tablet (5 mg total) by mouth at bedtime as needed for muscle spasms. Gabriel John, NP Taking Active   ezetimibe  (ZETIA ) 10 MG tablet [id number] No Take 1 tablet (10 mg total) by mouth daily. Millicent Ally, MD Taking Active   fexofenadine (ALLEGRA) 180 MG tablet [id number] No Take 180 mg by mouth daily. [provider] Taking Active   glucose blood (ACCU-CHEK GUIDE) test strip [id number] No USE TO CHECK SUGAR THREE TIMES DAILY AS DIRECTED Emilie Harden, MD Taking Active   insulin  glargine (LANTUS  SOLOSTAR) 100 UNIT/ML Solostar Pen [id number]  Inject 30 Units into the skin daily. for diabetes. Gabriel John, NP  Active   Insulin  Pen Needle (BD PEN NEEDLE NANO 2ND GEN) 32G X 4 MM MISC [id number] No USE 1 PEN NEEDLE WITH INSULIN  PEN once daily Clark, Katherine K, NP Taking Active   losartan  (COZAAR ) 100 MG tablet [id number] No Take 1 tablet (100 mg total) by mouth daily. Millicent Ally, MD Taking Active   metoprolol  succinate (TOPROL -XL) 50 MG 24 hr tablet [id number] No TAKE 1 TABLET BY MOUTH IN THE MORNING AND AT BEDTIME TAKE  WITH  OR  IMMEDIATELY  FOLLOWING  A  MEAL Millicent Ally, MD Taking Active   mometasone (NASONEX) 50 MCG/ACT nasal spray [id number] No Place 2 sprays into the nose daily. [provider] Taking Active   Multiple Vitamin (MULTIVITAMIN) capsule [id number] No Take 1 capsule by mouth  daily. [provider] Taking Active Self  pantoprazole  (PROTONIX ) 40 MG tablet [id number] No Take 1 tablet by mouth once daily Millicent Ally, MD Taking Active   Polyethyl Glycol-Propyl Glycol (SYSTANE OP) [id number] No Apply 2 drops to eye 2 (two) times daily.  [provider] Taking Active Self  rosuvastatin  (CRESTOR ) 10 MG tablet [id number] No Take 1 tablet (10 mg total) by mouth daily. for cholesterol. Millicent Ally, MD Taking Active   sertraline  (ZOLOFT ) 25 MG tablet [id number] No Take 1 tablet (25 mg total) by mouth daily. for anxiety and depression. Gabriel John, NP Taking Active   TRAVATAN Z 0.004 % SOLN ophthalmic solution [id number] No Place 1 drop into both eyes at bedtime. [provider] Taking Active Self           Med Note Caroline Cinnamon, Wilton Hasting Dec 05, 2014  2:09 PM)              Recommendation:   PCP Follow-up  Follow Up Plan:   Telephone follow-up in 1 month.  Patient transferring to [place]  Week, RN for ongoing case management follow up. Patient agreeable to transfer and ongoing follow up.   Verba Girt RN, BSN, CCM [employer], Population Health Case Manager Phone: [phone]

## 2023-07-04 NOTE — Telephone Encounter (Signed)
 I was informed by the patients daughter that PA was needed for LANTUS , not the freestlye sensors. We are aware she was requesting refill on the freestlye sensors too soon.  Please check and see about PA FOR LANTUS . Thanks.

## 2023-07-05 ENCOUNTER — Ambulatory Visit
Admission: RE | Admit: 2023-07-05 | Discharge: 2023-07-05 | Disposition: A | Discharge status: Home / Self Care | Source: Ambulatory Visit | Attending: Otolaryngology | Admitting: Otolaryngology

## 2023-07-05 DIAGNOSIS — R1310 Dysphagia, unspecified: Secondary | ICD-10-CM | POA: Insufficient documentation

## 2023-07-05 DIAGNOSIS — K219 Gastro-esophageal reflux disease without esophagitis: Secondary | ICD-10-CM | POA: Diagnosis not present

## 2023-07-05 NOTE — Progress Notes (Signed)
 Modified Barium Swallow Study  Patient Details  Name: Laura Ferguson MRN: [id number] Date of Birth: 17-Oct-1932  Today's Date: 07/05/2023  Modified Barium Swallow completed.  Full report located under Chart Review in the Imaging Section.  History of Present Illness Pt is an 89 yo female w/ PMH including back pain, CAD, HTN, hyperlipidemia, DM2, CABG x6, situational sadness s/p deaths of children and friend in recent years. She is HOH and doesn't always wear her HAs. Pt is eating a regular diet at home w/ no intentional weight loss. She denied any h/o pneumonia; no stroke. No CXR Imaging per chart for 5+ years.   She lives Alone. She endorses GERD/REFLUX s/s including Phlegm and mild throat clearing intermittently not at meals. She is on a PPI. She is careful eating certain foods(chews meats well); wear Full Dentures.   Clinical Impression Patient presents with functional oropharyngeal swallowing for Age, and in setting of Baseline h/o Esophageal phase Dysmotility, GERD/REFLUX on PPI. No oropharyngeal phase dysphagia noted at this exam. No laryngeal penetration nor aspiration noted at this study today. ANY Esophageal phase deficits can impact the oropharyngeal phase of swallowing as well as increase risk for Regurgitation of material superiorly from the Esophagus.   Oral phase is characterized by adequate lip closure, bolus preparation/mastication and containment, and anterior to posterior transit. Swallow initiation occurs primarily at the level of the valleculae w/ inconsistent spillage from the valleculae w/ liquids.  Pharyngeal phase is noted for adequate tongue base retraction, adequate hyolaryngeal excursion, and adequate pharyngeal constriction. Pharyngeal stripping wave is complete. Epiglottic inversion is complete w/ the majority of trials during the study. No laryngeal penetration nor aspiration occurred during this study. Any trace(normal) pharyngeal residue occurring cleared b/t trials w/  independent swallowing.   Amplitude/duration of cricopharyngeus opening appeared [facility] for bolus clearance. There was adequate/complete clearance through the upper cervical Esophagus until at ~C6-7 in which slight+ bolus stasis was noted w/ some increased textures (viewable at the level of the shoulders). Of note, the cricopharyngeus muscle area appeared min+ prominent AND there was the beginning of a diverticulum just below UES along posterior wall. Pt does have h/o GERD/RELUX and is on a PPI.  Pt was shown the video and educated on the results/recommendations from the study immediately after. Factors that may increase risk of adverse event in presence of aspiration Roderick Civatte & Jessy Morocco 2021): Respiratory or GI disease (Advanced Age)  Swallow Evaluation Recommendations Recommendations: PO diet PO Diet Recommendation: Regular; Thin liquids (Level 0) -- (cut/chopped meats; moistened foods -- avoid problematic foods) Liquid Administration via: Cup (less straw use = less air swallowed). No carbonated drinks at meals = less air swallowed Medication Administration: Whole meds with liquid (1 at a time; or in a Puree for ease of clearing if needed) Supervision: Patient able to self-feed Swallowing strategies: Minimize environmental distractions; Slow rate; Small bites/sips; Follow solids with liquids; Moisten foods well Postural changes: Position pt fully upright for meals; Stay upright 30-60 min after meals; Out of bed for meals (REFLUX/GERD precautions) Oral care recommendations: Oral care BID (2x/day); Pt independent with oral care Recommended consults: Consider GI consultation for ongoing education re: Esophageal phase motility/dysmotility and its impact; Consider esophageal assessment; Dietician if needed      Darla Edward, MS, CCC-SLP Speech Language Pathologist Rehab Services; [facility] [phone] (ascom) Leaman Abe 07/05/2023,4:06 PM

## 2023-07-12 ENCOUNTER — Other Ambulatory Visit: Payer: Self-pay | Admitting: Cardiovascular Disease

## 2023-07-16 ENCOUNTER — Other Ambulatory Visit: Payer: Self-pay | Admitting: *Deleted

## 2023-07-16 NOTE — Patient Instructions (Signed)
 Visit Information  Thank you for taking time to visit with me today. Please don't hesitate to contact me if I can be of assistance to you before our next scheduled appointment.  Your next care management appointment is by telephone on 07/31/23 at 11am  Telephone follow-up 07/31/23 at 11am with LCSW  Please call the care guide team at [phone] if you need to cancel, schedule, or reschedule an appointment.   Please call the Suicide and Crisis Lifeline: 988 if you are experiencing a Mental Health or Behavioral Health Crisis or need someone to talk to.  Vanetta Rule, LCSW [place]  [facility], [facility] Licensed Clinical Social Worker  Direct Dial: [phone]

## 2023-07-16 NOTE — Patient Outreach (Signed)
 Complex Care Management   Visit Note  07/16/2023  Name:  Laura Ferguson MRN: [id number] DOB: 07-24-1932  Situation: Referral received for Complex Care Management related to Mental/Behavioral Health diagnosis depression I obtained verbal consent from Patient.  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   LIMA to LAD, SVG to ramus intermedius, SVG to OM1 & OM2, SVG to acute marginal, SVG to distal RCA   Type 2 diabetes mellitus (HCC)     Assessment: Patient Reported Symptoms:  Cognitive Cognitive Status: Alert and oriented to person, place, and time, Normal speech and language skills      Neurological Neurological Review of Symptoms: No symptoms reported    HEENT HEENT Symptoms Reported: No symptoms reported      Cardiovascular Cardiovascular Symptoms Reported: No symptoms reported    Respiratory Respiratory Symptoms Reported: No symptoms reported    Endocrine Endocrine Symptoms Reported: No symptoms reported Is patient diabetic?: Yes Is patient checking blood sugars at home?: Yes List most recent blood sugar readings, include date and time of day: 07/16/23 blood sugar 91 fasting 10am Endocrine Self-Management Outcome: 3 (uncertain) Endocrine Comment: confirmed that patient now has her sensor, she has spoken to [facility] and they will replace it in 7/17  Gastrointestinal Gastrointestinal Symptoms Reported: No symptoms reported      Genitourinary Genitourinary Symptoms Reported: No symptoms reported    Integumentary Integumentary Symptoms Reported: No symptoms  reported    Musculoskeletal Musculoskelatal Symptoms Reviewed: No symptoms reported        Psychosocial Psychosocial Symptoms Reported: Sadness - if selected complete PHQ 2-9 Additional Psychological Details: mood tends to fluctuate-states that she has worries about her medications which has resolved Behavioral Management Strategies: Coping strategies, Medication therapy, Support group Major Change/Loss/Stressor/Fears (CP): Medical condition, self Behaviors When Feeling Stressed/Fearful: withdraws, tearful, re-framing, positive self talk Techniques to Cope with Loss/Stress/Change: Diversional activities, Spiritual practice(s), Withdraw Quality of Family Relationships: involved, supportive, helpful Do you feel physically threatened by others?: No      06/18/2023    1:01 PM  Depression screen PHQ 2/9  Decreased Interest 2  Down, Depressed, Hopeless 3  PHQ - 2 Score 5  Altered sleeping 1  Tired, decreased energy 0  Change in appetite 1  Feeling bad or failure about yourself  3  Trouble concentrating 0  Moving slowly or fidgety/restless 1  Suicidal thoughts 0  PHQ-9 Score 11  Difficult doing work/chores Somewhat difficult    There were no vitals filed for this visit.  Medications Reviewed Today   Medications were not reviewed in this encounter     Recommendation:   PCP Follow-up Specialty provider follow-up as scheduled Please continue to consider ongoing mental health follow up  Follow Up Plan:   Telephone follow up appointment date/time:  07/31/23  Lenn Mean, LCSW [place]  [facility], [facility] Licensed Clinical Social Worker  Direct Dial: [phone]

## 2023-07-23 DIAGNOSIS — H35033 Hypertensive retinopathy, bilateral: Secondary | ICD-10-CM | POA: Diagnosis not present

## 2023-07-23 DIAGNOSIS — H401132 Primary open-angle glaucoma, bilateral, moderate stage: Secondary | ICD-10-CM | POA: Diagnosis not present

## 2023-07-23 DIAGNOSIS — Z961 Presence of intraocular lens: Secondary | ICD-10-CM | POA: Diagnosis not present

## 2023-07-23 DIAGNOSIS — E113312 Type 2 diabetes mellitus with moderate nonproliferative diabetic retinopathy with macular edema, left eye: Secondary | ICD-10-CM | POA: Diagnosis not present

## 2023-07-23 DIAGNOSIS — H43813 Vitreous degeneration, bilateral: Secondary | ICD-10-CM | POA: Diagnosis not present

## 2023-07-23 DIAGNOSIS — H35373 Puckering of macula, bilateral: Secondary | ICD-10-CM | POA: Diagnosis not present

## 2023-07-25 ENCOUNTER — Other Ambulatory Visit: Payer: Self-pay

## 2023-07-25 NOTE — Patient Outreach (Signed)
 Complex Care Management   Visit Note  07/25/2023  Name:  Laura Ferguson MRN: [id number] DOB: [birth year]  Situation: Referral received for Complex Care Management related to Diabetes with Complications I obtained verbal consent from Patient.  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   LIMA to LAD, SVG to ramus intermedius, SVG to OM1 & OM2, SVG to acute marginal, SVG to distal RCA   Type 2 diabetes mellitus (HCC)     Assessment: Patient Reported Symptoms:  Cognitive Cognitive Status: Alert and oriented to person, place, and time, Insightful and able to interpret abstract concepts, Normal speech and language skills   Health Maintenance Behaviors: Annual physical exam, Spiritual practice(s), Healthy diet Healing Pattern: Average Health Facilitated by: Prayer/meditation, Rest  Neurological Neurological Review of Symptoms: No symptoms reported Neurological Management Strategies: Routine screening  HEENT HEENT Symptoms Reported: No symptoms reported HEENT Management Strategies: Routine screening, Medication therapy HEENT Comment: Ears recently cleaned out, Eye injection yesterday    Cardiovascular Cardiovascular Symptoms Reported: No symptoms reported Does patient have uncontrolled Hypertension?: No Cardiovascular Management Strategies: Routine screening, Medication therapy, Diet modification (low salt diet) Weight: 120 lb (54.4 kg) Cardiovascular Self-Management Outcome: 3 (uncertain)  Respiratory Respiratory Symptoms Reported: No symptoms reported    Endocrine  Endocrine Symptoms Reported: Hypoglycemia, Blurry vision Is patient diabetic?: Yes Is patient checking blood sugars at home?: Yes List most recent blood sugar readings, include date and time of day: FBG today 68 able to verbalize correct treatment advised healthy snack before bedtime Endocrine Comment: using Libre  Gastrointestinal Gastrointestinal Symptoms Reported: No symptoms reported Other Gastrointestinal Symptoms: GERD and constipation managed with medication Additional Gastrointestinal Details: Completed swallowing study - no aspiration, discussed recommendations Gastrointestinal Management Strategies: Medication therapy    Genitourinary Genitourinary Symptoms Reported: No symptoms reported    Integumentary Integumentary Symptoms Reported: No symptoms reported    Musculoskeletal Musculoskelatal Symptoms Reviewed: No symptoms reported Additional Musculoskeletal Details: Arthritis but states active, stretches, no pain Musculoskeletal Management Strategies: Activity, Adequate rest, Routine screening Falls in the past year?: No Number of falls in past year: 1 or less Was there an injury with Fall?: No Fall Risk Category Calculator: 0 Patient Fall Risk Level: Low Fall Risk Patient at Risk for Falls Due to: History of fall(s) Fall risk Follow up: Falls evaluation completed, Falls prevention discussed (reports climbing advised to ask family for assist to avoid climbing)  Psychosocial Additional Psychological Details: states doing better, finds LCSW apppointments helpful, not taking sertraline  regularly, afraid of side effects, the [place] is helping  PCP notified Behavioral Management Strategies: Coping strategies, Adequate rest, Counseling, Exercise, Medication therapy, Support system Behavioral Health Self-Management Outcome: 4 (good) Major Change/Loss/Stressor/Fears (CP): Medical condition, family, Medical condition, self, Death of a loved one Techniques to Cope with  Loss/Stress/Change: Diversional activities, Exercise, Medication, Spiritual practice(s) Quality of Family Relationships: helpful, involved, supportive Do you feel physically threatened by others?: No      07/25/2023   11:46 AM  Depression screen PHQ 2/9  Decreased Interest 0  Down, Depressed,  Hopeless 1  PHQ - 2 Score 1  Altered sleeping 0  Tired, decreased energy 0  Change in appetite 1  Feeling bad or failure about yourself  0  Trouble concentrating 0  Moving slowly or fidgety/restless 0  Suicidal thoughts 0  PHQ-9 Score 2  Difficult doing work/chores Somewhat difficult    Vitals:   07/25/23 1112 07/25/23 1133  BP: (!) 94/59   Pulse:  93    Medications Reviewed Today     Reviewed by Devra Lands, RN (Registered Nurse) on 07/25/23 at 1119  Med List Status: <None>   Medication Order Taking? Sig Documenting Provider Last Dose Status Informant  Acetaminophen (TYLENOL 8 HOUR PO) [id number] Yes Take 1 tablet by mouth every 8 (eight) hours as needed.  [provider]  Active Self  aspirin 81 MG tablet [id number] Yes Take 81 mg by mouth daily. [provider]  Active Self  Azelastine HCl 137 MCG/SPRAY SOLN [id number]  As needed  Patient not taking: Reported on 06/18/2023   [provider]  Active   Blood Glucose Monitoring Suppl (ACCU-CHEK GUIDE) w/Device KIT [id number] Yes Use as instructed to check blood sugar. Trixie File, MD  Active   Cholecalciferol (VITAMIN D) 50 MCG (2000 UT) CAPS [id number] Yes Take 1 capsule by mouth daily. [provider]  Active   Continuous Glucose Sensor (FREESTYLE LIBRE 3 PLUS SENSOR) MISC [id number] Yes Change sensor every 15 days. Gretta Comer POUR, NP  Active   Continuous Glucose Sensor (FREESTYLE LIBRE 3 SENSOR) [place] [id number]  APPLY SENSOR EVERY 14 DAYS TO CONTINOUSLY MONITOR GLUCOSE Clark, Katherine K, NP  Active   cyclobenzaprine  (FLEXERIL ) 5 MG tablet [id number] Yes Take 1 tablet (5 mg total) by mouth at  bedtime as needed for muscle spasms. Gretta Comer POUR, NP  Active   ezetimibe  (ZETIA ) 10 MG tablet [id number] Yes Take 1 tablet (10 mg total) by mouth daily. Burnard Debby LABOR, MD  Active   fexofenadine (ALLEGRA) 180 MG tablet [id number] Yes Take 180 mg by mouth daily. [provider]  Active   glucose blood (ACCU-CHEK GUIDE) test strip [id number] Yes USE TO CHECK SUGAR THREE TIMES DAILY AS DIRECTED Trixie File, MD  Active   insulin  glargine (LANTUS  SOLOSTAR) 100 UNIT/ML Solostar Pen [id number] Yes Inject 30 Units into the skin daily. for diabetes. Gretta Comer POUR, NP  Active   Insulin  Pen Needle (BD PEN NEEDLE NANO 2ND GEN) 32G X 4 MM MISC [id number] Yes USE 1 PEN NEEDLE WITH INSULIN  PEN once daily Clark, Katherine K, NP  Active   losartan  (COZAAR ) 100 MG tablet [id number] Yes Take 1 tablet (100 mg total) by mouth daily. Burnard Debby LABOR, MD  Active   metoprolol  succinate (TOPROL -XL) 50 MG 24 hr tablet [id number] Yes TAKE 1 TABLET BY MOUTH IN THE MORNING AND AT BEDTIME TAKE  WITH  OR  IMMEDIATELY  FOLLOWING  A  MEAL Burnard Debby LABOR, MD  Active   mometasone (NASONEX) 50 MCG/ACT nasal spray [id number]  Place 2 sprays into the nose daily.  Patient not taking: Reported on 07/25/2023   [provider]  Active   Multiple Vitamin (MULTIVITAMIN) capsule [id number] Yes Take 1 capsule by mouth daily. [provider]  Active Self  pantoprazole  (PROTONIX ) 40 MG tablet [id number] Yes Take 1 tablet by mouth once daily Burnard Debby LABOR, MD  Active   Polyethyl Glycol-Propyl Glycol (SYSTANE OP) [id number] Yes Apply 2 drops to eye 2 (two) times daily.  [provider]  Active  Self  rosuvastatin  (CRESTOR ) 10 MG tablet [id number] Yes Take 1 tablet (10 mg total) by mouth daily. for cholesterol. Burnard Debby LABOR, MD  Active   sertraline  (ZOLOFT ) 25 MG tablet [id number] Yes Take 1 tablet (25 mg total) by mouth daily. for anxiety and depression.  Patient taking differently: Take 25 mg by mouth  daily. Patient states not taking all of the time, only 2-3x week, would like to stop since doing better, made aware to be taking regularly for benefit, PCP notified   Clark, Katherine K, NP  Active   TRAVATAN Z 0.004 % SOLN ophthalmic solution [id number] Yes Place 1 drop into both eyes at bedtime. [provider]  Active Self           Med Note FELIPE, MELISSA B   Sun Dec 05, 2014  2:09 PM)              Recommendation:   PCP Follow-up Continue Current Plan of Care  Follow Up Plan:   Telephone follow-up in 1 month  Nestora Duos, MSN, RN [facility]  [facility], [facility] RN Care Manager Direct Dial: [phone] Fax: [phone]

## 2023-07-25 NOTE — Patient Instructions (Signed)
 Visit Information  Thank you for taking time to visit with me today. Please don't hesitate to contact me if I can be of assistance to you before our next scheduled appointment.  Your next care management appointment is by telephone on 08/22/2023 at 11:00 am  Telephone follow-up in 1 month  Please call the care guide team at [phone] if you need to cancel, schedule, or reschedule an appointment.   Please call the Suicide and Crisis Lifeline: 988 call the [place]  National Suicide Prevention Lifeline: (224)484-3764 or TTY: 612-417-5160 TTY 303 819 9589) to talk to a trained counselor call 1-800-273-TALK (toll free, 24 hour hotline) go to [facility] [address], [place] [phone]) call 911 if you are experiencing a Mental Health or Behavioral Health Crisis or need someone to talk to.  Nestora Duos, MSN, RN [place]  [facility], [facility] RN Care Manager Direct Dial: [phone] Fax: [phone]  Hypotension - Low Blood Pressure As your heart beats, it forces blood through your body. This force is called blood pressure. If you have hypotension, you have low blood pressure.  When your blood pressure is too low, you may not get enough blood to your brain or other parts of your body. This may cause you to feel weak, light-headed, have a fast heartbeat, or even faint. Low blood pressure may be harmless, or it may cause serious problems. What are the causes? Blood loss. Not enough water in the body (dehydration). Heart problems. Hormone problems. Pregnancy. A very bad infection. Not having enough of certain nutrients. Very bad allergic reactions. Certain medicines. What increases the risk? Age. The risk increases as you get older. Conditions that affect the heart or the brain and spinal cord (central nervous system). What are the signs or symptoms? Feeling: Weak. Light-headed. Dizzy. Tired  (fatigued). Blurred vision. Fast heartbeat. Fainting, in very bad cases. How is this treated? Changing your diet. This may involve drinking more water or including more salt (sodium) in your diet by eating high-salt foods. Taking medicines to raise your blood pressure. Changing how much you take (the dosage) of some of your medicines. Wearing compression stockings. These stockings help to prevent blood clots and reduce swelling in your legs. In some cases, you may need to go to the hospital to: Receive fluids through an IV tube. Receive donated blood through an IV tube (transfusion). Get treated for an infection or heart problems, if this applies. Be monitored while medicines that you are taking wear off. Follow these instructions at home: Eating and drinking  Drink enough fluids to keep your pee (urine) pale yellow. Eat a healthy diet. Follow instructions from your doctor about what you can eat or drink. A healthy diet includes: Fresh fruits and vegetables. Whole grains. Low-fat (lean) meats. Low-fat dairy products. If told, include more salt in your diet. Do not add extra salt to your diet unless your doctor tells you to. Eat small meals often. Avoid standing up quickly after you eat. Medicines Take over-the-counter and prescription medicines only as told by your doctor. Follow instructions from your doctor about changing how much you take of your medicines, if this applies. Do not stop or change any of your medicines on your own. General instructions  Wear compression stockings as told by your doctor. Get up slowly from lying down or sitting. Avoid hot showers and a lot of heat as told by your doctor. Return to your normal activities when your doctor says that it is safe.  Do not smoke or use any products that contain nicotine or tobacco. If you need help quitting, ask your doctor. Keep all follow-up visits. Contact a doctor if: You vomit. You have watery poop  (diarrhea). You have a fever for more than 2-3 days. You feel more thirsty than normal. You feel weak and tired. Get help right away if: You have chest pain. You have a fast or uneven heartbeat. You lose feeling (have numbness) in any part of your body. You cannot move your arms or your legs. You have trouble talking. You get sweaty or feel light-headed. You faint. You have trouble breathing. You have trouble staying awake. You feel mixed up (confused). These symptoms may be an emergency. Get help right away. Call 911. Do not wait to see if the symptoms will go away. Do not drive yourself to the hospital. Summary Hypotension is also called low blood pressure. It is when the force of blood pumping through your body is too weak. Hypotension may be harmless, or it may cause serious problems. Treatment may include changing your diet and medicines, and wearing compression stockings. In very bad cases, you may need to go to the hospital. This information is not intended to replace advice given to you by your health care provider. Make sure you discuss any questions you have with your health care provider. Document Revised: 08/22/2020 Document Reviewed: 08/22/2020 Elsevier Patient Education  2024 Elsevier Inc.  HYPOGLYCEMIA - Low Blood Sugar Hypoglycemia is when the amount of sugar, or glucose, in your blood is too low. Low blood sugar can happen if you have diabetes or if you don't have diabetes. It may be an emergency. Work with your health care provider to make and change your meal plan as needed. This can help prevent low blood sugar. What can increase my risk? You may be more likely to get low blood sugar if: You take insulin  or other diabetes medicines. You skip or delay a meal or snack. You get sick. How can low blood sugar affect me? Mild symptoms Mild cases may not cause symptoms. If you do have symptoms, they may include: Hunger or feeling like you may vomit. Sweating and  feeling cold to the touch. Feeling dizzy or light-headed. Being sleepy or having trouble sleeping. A fast heart rate. A headache. Blurry eyesight. Mood changes. These include feeling worried, nervous, or easily annoyed. Tingling or numbness around your mouth, lips, or tongue. If a mild case of low blood sugar isn't treated, it can become moderate or severe. Moderate symptoms If you have a moderate case, you may: Feel confused. Have changes in the way you act or move. Feel weak. Have an uneven heartbeat. Severe symptoms Having very low blood sugar is an emergency. It can cause: Fainting. Seizures. A coma. Death. What nutrition changes can I make? Work with your provider or an expert in healthy eating called a dietitian to make a meal plan. Eat meals at set times. Have snacks between meals, as told by your provider. Donot skip or delay meals or snacks. What other actions can I take to prevent low blood sugar?  Work closely with your provider to manage your blood sugar. Make sure you know: What your blood sugar should be. How and when to check your blood sugar. The symptoms of low blood sugar. Be sure to eat food when you drink alcohol. When you're sick, check your blood sugar more often. Make a sick day plan in advance with your provider. Follow this plan  when you can't eat or drink like normal. Always check your blood sugar before, during, and after exercise. How is this treated? Treating low blood sugar If you have low blood sugar, eat or drink something with sugar in it right away. The food or drink should have 15 grams of a fast-acting carbohydrate (carb). Options include: 4 oz (120 mL) of fruit juice. 4 oz (120 mL) of soda (not diet soda). A few pieces of hard candy. Check food labels to see how many pieces to eat. 1 Tbsp (15 mL) of sugar or honey. 4 glucose tablets. 1 tube of glucose gel. Treating low blood sugar if you have diabetes If you're alert and can swallow  safely, follow the 15:15 rule: Take 15 grams of a fast-acting carb. Talk with your provider about how much carb you should take. Check your blood sugar 15 minutes after you take the carb. If your blood sugar is still at or below 70 mg/dL (3.9 mmol/L), take 15 grams of a carb again. If your blood sugar doesn't go above 70 mg/dL (3.9 mmol/L) after 3 tries, get help right away. After your blood sugar goes back to normal, eat a meal or a snack within 1 hour. Treating very low blood sugar If your blood sugar is less than 54 mg/dL (3 mmol/L), it's an emergency. Get help right away. If you can't eat or drink, you will need to be given glucagon. A family member or friend should learn how to check your blood sugar and give you glucagon. Ask your provider if you should keep a glucagon kit at home. You may also need to be treated in a hospital. Where to find more information American Diabetes Association (ADA): diabetes.[employer] of Diabetes and Digestive and Kidney Diseases (NIDDK): StageSync.si Association of Diabetes Care & Education Specialists: diabeteseducator.org Contact a health care provider if: You have diabetes and are having trouble keeping your blood sugar in the right range. You have low blood sugar often. Get help right away if: You can't get your blood sugar above 70 mg/dL (3.9 mmol/L) after 3 tries. Your blood sugar is below 54 mg/dL (3 mmol/L). You faint. You have a seizure. These symptoms may be an emergency. Call 911 right away. Do not wait to see if the symptoms will go away. Do not drive yourself to the hospital. This information is not intended to replace advice given to you by your health care provider. Make sure you discuss any questions you have with your health care provider. Document Revised: 03/22/2022 Document Reviewed: 03/22/2022 Elsevier Patient Education  2024 [employer].  Advance Directive  Advance directives are legal papers that state your  wishes about health care decisions. They let your wishes be known to family, friends, and health care providers if you become unable to speak for yourself.  You should write these papers out over time rather than all at once. They can be changed and updated at any time. The types of advance directives include: Medical power of attorney (POA). Living wills. Do not resuscitate (DNR) or do not attempt resuscitation (DNAR) orders. What are a health care proxy and medical POA? A health care proxy is also called a health care agent. It's a person you choose to make medical decisions for you when you can't make them for yourself. In most cases, a proxy is a trusted friend or family member. A medical POA is legal paperwork that names your proxy. It may need to be: Signed. Notarized. Dated. Copied. Witnessed.  Added to your medical record. You may also want to choose someone to handle your money if you can't do so. This is called a durable POA for finances. It's separate from a medical POA. You may choose your health care proxy or someone else to act as your agent in money matters. If you don't have a proxy, or if the proxy may not be acting in your best interest, a court may choose a guardian to act on your behalf. What is a living will? A living will is legal paperwork that states your wishes about medical care. Providers should keep a copy of it in your medical record. You may want to give a copy to family members or friends. You can also keep a card in your wallet to let loved ones know you have a living will and where they can find it. A living will may be used if: You're very sick with something that will end your life. You become disabled. You can't make decisions or speak for yourself. Your living will should include whether: To use or not use life support equipment. This may include machines to filter your blood or to help you breathe. You want a DNR or DNAR order. This tells providers not to  use CPR if your heart or breathing stops. To use or not use tube feeding. You want to be given foods and fluids. You want a type of comfort care called palliative care. This may be given when the goal for treatment becomes comfort rather than a cure. You want to donate your organs and tissues. A living will doesn't say what to do with your money and property if you pass away. What is a DNR or DNAR? A DNR or DNAR order is a request not to have CPR. If you don't have one of these orders, a provider will try to help you if your heart stops or you stop breathing.  If you plan to have surgery, talk with your provider about your DNR or DNAR order. What happens if I don't have an advance directive? Each state has its own laws about advance directives. Some states assign family decision makers to act on your behalf if you don't have an advance directive.  Check with your provider, attorney, or state representative about the laws in your state. Where to find more information Each state has its own laws about advance directives. You can look up these laws at: https://rodriguez-phillips.com/ This information is not intended to replace advice given to you by your health care provider. Make sure you discuss any questions you have with your health care provider. Document Revised: 05/21/2022 Document Reviewed: 05/21/2022 Elsevier Patient Education  2024 [employer].

## 2023-07-31 ENCOUNTER — Other Ambulatory Visit: Payer: Self-pay | Admitting: *Deleted

## 2023-07-31 NOTE — Patient Instructions (Signed)
 Visit Information  Thank you for taking time to visit with me today. Please don't hesitate to contact me if I can be of assistance to you before our next scheduled appointment.  Your next care management appointment is by telephone on 08/22/23 at 11am   Please call the care guide team at [phone] if you need to cancel, schedule, or reschedule an appointment.   Please call 911 if you are experiencing a Mental Health or Behavioral Health Crisis or need someone to talk to.  Zayven Powe, LCSW [place]  [facility], [facility] Licensed Clinical Social Worker  Direct Dial: [phone]

## 2023-07-31 NOTE — Patient Outreach (Signed)
 Complex Care Management   Visit Note  07/31/2023  Name:  Laura Ferguson MRN: [id number] DOB: 27-Jun-1932  Situation: Referral received for Complex Care Management related to Mental/Behavioral Health diagnosis Grief and Loss. Patient confirms improvement in moodI obtained verbal consent from Patient.  Visit completed with patient  on the phone.  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   LIMA to LAD, SVG to ramus intermedius, SVG to OM1 & OM2, SVG to acute marginal, SVG to distal RCA   Type 2 diabetes mellitus (HCC)     Assessment: Patient Reported Symptoms:  Cognitive Cognitive Status: Alert and oriented to person, place, and time, Insightful and able to interpret abstract concepts, Normal speech and language skills      Neurological Neurological Review of Symptoms: No symptoms reported    HEENT HEENT Symptoms Reported: No symptoms reported      Cardiovascular Cardiovascular Symptoms Reported: No symptoms reported    Respiratory Respiratory Symptoms Reported: No symptoms reported    Endocrine Endocrine Symptoms Reported: Blurry vision Is patient diabetic?: Yes Is patient checking blood sugars at home?: Yes List most recent blood sugar readings, include date and time of day: 121 fasting  07/31/23 8am Endocrine Comment: using libre  Gastrointestinal Gastrointestinal Symptoms Reported: No symptoms reported      Genitourinary Genitourinary Symptoms Reported: No symptoms reported    Integumentary Integumentary Symptoms Reported: No symptoms reported    Musculoskeletal Musculoskelatal Symptoms Reviewed:  No symptoms reported        Psychosocial Additional Psychological Details: patient' s that she is doing better, gettiing out more, going to to church Behavioral Management Strategies: Coping strategies, Counseling, Medication therapy, Support system Major Change/Loss/Stressor/Fears (CP): Medical condition, self Behaviors When Feeling Stressed/Fearful: getting out more-driving, visitingwith family Techniques to [place] with Loss/Stress/Change: Diversional activities, Spiritual practice(s), Medication, Exercise Quality of Family Relationships: helpful, involved, supportive Do you feel physically threatened by others?: No      07/25/2023   11:46 AM  Depression screen PHQ 2/9  Decreased Interest 0  Down, Depressed, Hopeless 1  PHQ - 2 Score 1  Altered sleeping 0  Tired, decreased energy 0  Change in appetite 1  Feeling bad or failure about yourself  0  Trouble concentrating 0  Moving slowly or fidgety/restless 0  Suicidal thoughts 0  PHQ-9 Score 2  Difficult doing work/chores Somewhat difficult    There were no vitals filed for this visit.  Medications Reviewed Today   Medications were not reviewed in this encounter     Recommendation:   PCP Follow-up Specialty provider follow-up 09/22/33 diabetic foot doctor Continue to utilize coping mechanisms discussed today to manage depression  Follow Up Plan:   Telephone follow-up 08/29/23  Lenn Mean, LCSW [place]  [facility], [facility] Licensed Clinical Social Worker  Direct Dial: [phone]

## 2023-08-06 ENCOUNTER — Ambulatory Visit: Payer: Self-pay | Admitting: Primary Care

## 2023-08-06 ENCOUNTER — Encounter: Payer: Self-pay | Admitting: Primary Care

## 2023-08-06 ENCOUNTER — Ambulatory Visit (INDEPENDENT_AMBULATORY_CARE_PROVIDER_SITE_OTHER): Admitting: Primary Care

## 2023-08-06 ENCOUNTER — Telehealth: Payer: Self-pay | Admitting: Primary Care

## 2023-08-06 VITALS — BP 128/72 | HR 58 | Temp 97.6°F | Ht 63.0 in | Wt 118.0 lb

## 2023-08-06 DIAGNOSIS — I10 Essential (primary) hypertension: Secondary | ICD-10-CM

## 2023-08-06 DIAGNOSIS — K219 Gastro-esophageal reflux disease without esophagitis: Secondary | ICD-10-CM

## 2023-08-06 DIAGNOSIS — E785 Hyperlipidemia, unspecified: Secondary | ICD-10-CM | POA: Diagnosis not present

## 2023-08-06 DIAGNOSIS — Z659 Problem related to unspecified psychosocial circumstances: Secondary | ICD-10-CM | POA: Diagnosis not present

## 2023-08-06 DIAGNOSIS — I2510 Atherosclerotic heart disease of native coronary artery without angina pectoris: Secondary | ICD-10-CM | POA: Diagnosis not present

## 2023-08-06 DIAGNOSIS — Z794 Long term (current) use of insulin: Secondary | ICD-10-CM | POA: Diagnosis not present

## 2023-08-06 DIAGNOSIS — E2839 Other primary ovarian failure: Secondary | ICD-10-CM

## 2023-08-06 DIAGNOSIS — M545 Low back pain, unspecified: Secondary | ICD-10-CM

## 2023-08-06 DIAGNOSIS — Z0000 Encounter for general adult medical examination without abnormal findings: Secondary | ICD-10-CM | POA: Diagnosis not present

## 2023-08-06 DIAGNOSIS — F419 Anxiety disorder, unspecified: Secondary | ICD-10-CM | POA: Diagnosis not present

## 2023-08-06 DIAGNOSIS — E11319 Type 2 diabetes mellitus with unspecified diabetic retinopathy without macular edema: Secondary | ICD-10-CM | POA: Diagnosis not present

## 2023-08-06 DIAGNOSIS — G8929 Other chronic pain: Secondary | ICD-10-CM

## 2023-08-06 DIAGNOSIS — F32A Depression, unspecified: Secondary | ICD-10-CM

## 2023-08-06 LAB — POCT GLYCOSYLATED HEMOGLOBIN (HGB A1C): Hemoglobin A1C: 9.4 % — AB (ref 4.0–5.6)

## 2023-08-06 NOTE — Assessment & Plan Note (Signed)
 Improved.  Continue Tylenol PRN.

## 2023-08-06 NOTE — Assessment & Plan Note (Signed)
 Remains uncontrolled with A1c of 9.4 today.  Poor historian regarding glucose readings.  Unfortunately, she does not bring her meter for review. Unclear if she is actually experiencing hypoglycemic episodes or not.  Given her frailty, and prior history of hypoglycemia, will avoid insulin  dose adjustments at this point.  I will have our pharmacy team connect with her to download CGM readings and develop a safe plan of action. Continue Lantus  30 units yeah daily for now.

## 2023-08-06 NOTE — Assessment & Plan Note (Signed)
 Improved per patient.  Inconsistent use of sertraline . Discussed benefits of taking regularly. She will think about it.  Continue with social work appointments.

## 2023-08-06 NOTE — Telephone Encounter (Unsigned)
 Copied from CRM [id number]. Topic: General - Other >> Aug 06, 2023  4:45 PM Donee H wrote: Reason for CRM: Patient is requesting for Comer Foerster nurse to give her a callback regarding today's visit. Patient stated saw Comer Gaskins today and was asked a couple of questions and she would like to respond to what was being asked. Patient would not go into detail regarding questions but stated she thought about it when she returned home and need to speak with the nurse or doctor. Callback number [phone]

## 2023-08-06 NOTE — Assessment & Plan Note (Signed)
 Reviewed lipid panel from May 2025.  Continue Zetia  10 mg and rosuvastatin  10 mg daily.

## 2023-08-06 NOTE — Telephone Encounter (Signed)
 Laura Ferguson, can we get her back on the schedule with your team for diabetes follow up?   I evaluated her today, A1C was 9.4, unchanged from last time. She didn't have her CGM reader during her appointment.  Managed on Lantus  30 units. Poor historian with glucose levels.

## 2023-08-06 NOTE — Assessment & Plan Note (Signed)
 Asymptomatic.  Following with cardiology, office notes reviewed from May 2025. Continue Asprin 81 mg daily, lipid control, BP control.

## 2023-08-06 NOTE — Patient Instructions (Signed)
 My pharmacist will reach out to you soon regarding your blood sugar levels.  Please schedule a follow up visit for 3 months.  It was a pleasure to see you today!

## 2023-08-06 NOTE — Assessment & Plan Note (Signed)
 Controlled.  Continue pantoprazole 40 mg daily.

## 2023-08-06 NOTE — Progress Notes (Signed)
 Subjective:    Patient ID: Laura Ferguson, female    DOB: Jul 17, 1932, 89 y.o.   MRN: [id number]  HPI  Laura Ferguson is a very pleasant 89 y.o. female with a history of hypertension, CAD, type 2 diabetes, hyperlipidemia who presents today for complete physical and follow up of chronic conditions.  She checks glucose readings consistently with CGM, freestyle Libre. She does not have her meter with her today. Glucose readings have been high sometimes, and in the 50s other times.   Immunizations:  -Shingles: Never completed -Pneumonia: Completed Prevnar 13 in 2021, Pneumovax 23 in 2023  Diet: Fair diet.  Exercise: No regular exercise.  Eye exam: Completes annually  Dental exam: Completes semi-annually    Mammogram: Completed years ago, declines given age Bone Density Scan: Completed years ago.   BP Readings from Last 3 Encounters:  08/06/23 128/72  07/25/23 (!) 94/59  07/02/23 116/84       Review of Systems  Constitutional:  Negative for unexpected weight change.  HENT:  Negative for rhinorrhea.   Respiratory:  Negative for cough and shortness of breath.   Cardiovascular:  Negative for chest pain.  Gastrointestinal:  Negative for constipation and diarrhea.  Genitourinary:  Negative for difficulty urinating.  Musculoskeletal:  Positive for arthralgias.  Skin:  Negative for rash.  Allergic/Immunologic: Negative for environmental allergies.  Neurological:  Negative for dizziness and headaches.  Psychiatric/Behavioral:  The patient is not nervous/anxious.          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  Substance and Sexual Activity   Alcohol use: No    Alcohol/week: 0.0 standard drinks of alcohol   Drug use: No   Sexual activity: Not on file  Other Topics Concern   Not on file  Social History Narrative   Married.   Retired.   Works as a Engineer, structural.    Enjoys helping other.    Social Drivers of Corporate investment banker Strain: Low Risk  (01/22/2023)   Overall Financial Resource Strain (CARDIA)    Difficulty of Paying Living Expenses: Not hard at all  Food Insecurity: No Food Insecurity (07/25/2023)   Hunger Vital Sign    Worried About Running Out of Food in the Last Year: Never true    Ran Out of Food in the Last Year: Never true  Transportation Needs: No Transportation Needs (07/25/2023)   PRAPARE - Administrator, Civil Service (Medical):  No    Lack of Transportation (Non-Medical): No  Physical Activity: Inactive (01/22/2023)   Exercise Vital Sign    Days of Exercise per Week: 0 days    Minutes of Exercise per Session: 0 min  Stress: No Stress Concern Present (01/22/2023)   [employer] of Occupational Health - Occupational Stress Questionnaire    Feeling of Stress : Only a little  Social Connections: Moderately Isolated (01/22/2023)   Social Connection and Isolation Panel    Frequency of Communication with Friends and Family: More than three times a week    Frequency of Social Gatherings with Friends and Family: Once a week    Attends Religious Services: More than 4 times  per year    Active Member of [employer] or Organizations: No    Attends Banker Meetings: Never    Marital Status: Widowed  Intimate Partner Violence: Not At Risk (07/25/2023)   Humiliation, Afraid, Rape, and Kick questionnaire    Fear of Current or Ex-Partner: No    Emotionally Abused: No    Physically Abused: No    Sexually Abused: No    Past Surgical History:  Procedure Laterality Date   CARDIAC CATHETERIZATION  05/07/2007   EF 55%, focal mild hypocontractility in mid-distal inferior wall & mid posterolateral wall; severe multivessel CAD - susequent CABGx6 (Dr. IVAR Sor)   CORONARY ARTERY BYPASS GRAFT  05/09/2007   LIMA to LAD, veing to intermediate; SVG to OM1 & OM2; SVG to acute marginal & distal RCA (Dr. Kerrin)   TRANSTHORACIC ECHOCARDIOGRAM  02/13/2010   EF =>55%, vigorous contraction EF 65%; LA mild-mod dilated; IV normal diameter - normal CVP; trace MR; mild TR; trace AV regurg    Family History  Problem Relation Age of Onset   Cancer Mother    Diabetes Sister    Cancer Child     No Known Allergies  Current Outpatient Medications on File Prior to Visit  Medication Sig Dispense Refill   Acetaminophen (TYLENOL 8 HOUR PO) Take 1 tablet by mouth every 8 (eight) hours as needed.      aspirin 81 MG tablet Take 81 mg by mouth daily.     Azelastine HCl 137 MCG/SPRAY SOLN As needed     Blood Glucose Monitoring Suppl (ACCU-CHEK GUIDE) w/Device KIT Use as instructed to check blood sugar. 1 kit 0   Cholecalciferol (VITAMIN D) 50 MCG (2000 UT) CAPS Take 1 capsule by mouth daily.     Continuous Glucose Sensor (FREESTYLE LIBRE 3 PLUS SENSOR) MISC Change sensor every 15 days. 6 each 3   Continuous Glucose Sensor (FREESTYLE LIBRE 3 SENSOR) MISC APPLY SENSOR EVERY 14 DAYS TO CONTINOUSLY MONITOR GLUCOSE 6 each 1   ezetimibe  (ZETIA ) 10 MG tablet Take 1 tablet (10 mg total) by mouth daily. 90 tablet 2   fexofenadine (ALLEGRA) 180 MG tablet Take 180 mg by mouth daily.      glucose blood (ACCU-CHEK GUIDE) test strip USE TO CHECK SUGAR THREE TIMES DAILY AS DIRECTED 250 each 3   insulin  glargine (LANTUS  SOLOSTAR) 100 UNIT/ML Solostar Pen Inject 30 Units into the skin daily. for diabetes. 30 mL 0   Insulin  Pen Needle (BD PEN NEEDLE NANO 2ND GEN) 32G X 4 MM MISC USE 1 PEN NEEDLE WITH INSULIN  PEN once daily 100 each 3   losartan  (COZAAR ) 100 MG tablet Take 1 tablet (100 mg total) by mouth daily. 90 tablet 1   metoprolol  succinate (TOPROL -XL) 50 MG 24 hr tablet TAKE 1 TABLET BY  MOUTH IN THE MORNING AND AT BEDTIME TAKE  WITH  OR  IMMEDIATELY  FOLLOWING  A  MEAL 180 tablet 3   Multiple Vitamin (MULTIVITAMIN) capsule Take 1 capsule by mouth daily.     pantoprazole  (PROTONIX ) 40 MG tablet Take 1 tablet by mouth once daily 90 tablet 3   Polyethyl Glycol-Propyl Glycol (SYSTANE OP) Apply 2 drops to eye 2 (two) times daily.      rosuvastatin  (CRESTOR ) 10 MG tablet Take 1 tablet (10 mg total) by mouth daily. for cholesterol. 90 tablet 1   sertraline  (ZOLOFT ) 25 MG tablet Take 1 tablet (25 mg total) by mouth daily. for anxiety and depression. 90 tablet 0   mometasone (NASONEX) 50 MCG/ACT nasal spray Place 2 sprays into the nose daily. (Patient not taking: Reported on 08/06/2023)     TRAVATAN Z 0.004 % SOLN ophthalmic solution Place 1 drop into both eyes at bedtime.     No current facility-administered medications on file prior to visit.    BP 128/72   Pulse (!) 58   Temp 97.6 F (36.4 C) (Temporal)   Ht 5' 3 (1.6 m)   Wt 118 lb (53.5 kg)   SpO2 98%   BMI 20.90 kg/m  Objective:   Physical Exam HENT:     Right Ear: Tympanic membrane and ear canal normal.     Left Ear: Tympanic membrane and ear canal normal.  Eyes:     Pupils: Pupils are equal, round, and reactive to light.  Cardiovascular:     Rate and Rhythm: Normal rate and regular rhythm.  Pulmonary:     Effort: Pulmonary effort is normal.     Breath sounds: Normal breath sounds.  Abdominal:     General: Bowel  sounds are normal.     Palpations: Abdomen is soft.     Tenderness: There is no abdominal tenderness.  Musculoskeletal:        General: Normal range of motion.     Cervical back: Neck supple.  Skin:    General: Skin is warm and dry.  Neurological:     Mental Status: She is alert and oriented to person, place, and time.     Cranial Nerves: No cranial nerve deficit.     Deep Tendon Reflexes:     Reflex Scores:      Patellar reflexes are 2+ on the right side and 2+ on the left side. Psychiatric:        Mood and Affect: Mood normal.           Assessment & Plan:  Preventative health care Assessment & Plan: Discussed shingles vaccine.   Mammogram declined given age. Bone density scan declined by patient. Colonoscopy N/A given age  Discussed the importance of a healthy diet and regular exercise in order for weight loss, and to reduce the risk of further co-morbidity.  Exam stable. Labs reviewed.  Follow up in 1 year for repeat physical.    Type 2 diabetes mellitus with retinopathy, with long-term current use of insulin , macular edema presence unspecified, unspecified laterality, unspecified retinopathy severity (HCC) Assessment & Plan: Remains uncontrolled with A1c of 9.4 today.  Poor historian regarding glucose readings.  Unfortunately, she does not bring her meter for review. Unclear if she is actually experiencing hypoglycemic episodes or not.  Given her frailty, and prior history of hypoglycemia, will avoid insulin  dose adjustments at this point.  I will have our pharmacy team connect with her to download CGM readings and develop a safe  plan of action. Continue Lantus  30 units yeah daily for now.  Orders: -     POCT glycosylated hemoglobin (Hb A1C)  Coronary artery disease involving native coronary artery of native heart without angina pectoris Assessment & Plan: Asymptomatic.  Following with cardiology, office notes reviewed from May 2025. Continue Asprin 81  mg daily, lipid control, BP control.    Gastroesophageal reflux disease, unspecified whether esophagitis present Assessment & Plan: Controlled.  Continue pantoprazole  40 mg daily    Anxiety and depression Assessment & Plan: Improved per patient.  Inconsistent use of sertraline  as she takes when I feel like I'm going to get upset.  Discussed the importance of consistent use of sertraline  for better treatment of anxiety/depression.   She will think about resuming sertraline  25 mg and update if she does.   Chronic bilateral low back pain without sciatica Assessment & Plan: Improved.  Continue Tylenol PRN.    Hyperlipidemia, unspecified hyperlipidemia type Assessment & Plan: Reviewed lipid panel from May 2025.  Continue Zetia  10 mg and rosuvastatin  10 mg daily.   Other social stressor Assessment & Plan: Improved per patient.  Inconsistent use of sertraline . Discussed benefits of taking regularly. She will think about it.  Continue with social work appointments.    Primary hypertension Assessment & Plan: Controlled.  Continue metoprolol  succinate 50 mg twice daily, losartan  100 mg daily. CMP reviewed from May 2025.         Dannisha Eckmann K Fatisha Rabalais, NP

## 2023-08-06 NOTE — Assessment & Plan Note (Signed)
 Improved per patient.  Inconsistent use of sertraline  as she takes when I feel like I'm going to get upset.  Discussed the importance of consistent use of sertraline  for better treatment of anxiety/depression.   She will think about resuming sertraline  25 mg and update if she does.

## 2023-08-06 NOTE — Assessment & Plan Note (Addendum)
 Discussed shingles vaccine.   Mammogram declined given age. Bone density scan declined by patient. Colonoscopy N/A given age  Discussed the importance of a healthy diet and regular exercise in order for weight loss, and to reduce the risk of further co-morbidity.  Exam stable. Labs reviewed.  Follow up in 1 year for repeat physical.

## 2023-08-06 NOTE — Assessment & Plan Note (Signed)
 Controlled.  Continue metoprolol  succinate 50 mg twice daily, losartan  100 mg daily. CMP reviewed from May 2025.

## 2023-08-07 NOTE — Telephone Encounter (Signed)
 Called patient states she thought about the scan that Laura Ferguson wanted her to do for her bones and she is agreeable to doing it. She prefers location in [place].

## 2023-08-07 NOTE — Telephone Encounter (Signed)
 Please call patient:  Let her know that I placed an order for the bone density scan at the [facility] well breast center in [place].  She will need to call them to schedule.

## 2023-08-08 ENCOUNTER — Other Ambulatory Visit: Payer: Self-pay | Admitting: Primary Care

## 2023-08-08 ENCOUNTER — Telehealth: Payer: Self-pay | Admitting: Primary Care

## 2023-08-08 DIAGNOSIS — Z1231 Encounter for screening mammogram for malignant neoplasm of breast: Secondary | ICD-10-CM

## 2023-08-08 NOTE — Telephone Encounter (Signed)
 Called patient and reviewed all information. Patient verbalized understanding. Will call if any further questions.

## 2023-08-08 NOTE — Telephone Encounter (Signed)
 Copied from CRM [id number]. Topic: General - Call Back - No Documentation >> Aug 08, 2023  8:29 AM Avram MATSU wrote: Reason for CRM: Daughter of patient would like a callback from the provider about her moms visit yesterday. She stated she has a few questions and concerns. She stated you can call her or the sister teresa.  [phone](mzhpwj after 12:30)  [phone])

## 2023-08-08 NOTE — Telephone Encounter (Signed)
 Please call patient's daughter and learn about their concerns. Thanks!  I reached out to our pharmacist to see if she can download the patient's blood glucose readings from her meter to find out if she is actually dropping on her blood sugars.  Her diabetes levels were uncontrolled so we need to make an adjustment but I would like to consult with our pharmacist first given her medical history and history of drops in blood sugars.   We will be in touch once we receive a response from our pharmacist.

## 2023-08-08 NOTE — Telephone Encounter (Signed)
 Called and spoke with patients daughter Verneita, advised of Meredith message. Verneita is requesting to speak with Mallie directly regarding her mothers overall health. She would like a a run down of Kates physical assessment of her mother being her primary care provider. Reviewed the message regarding waiting on a response from the pharmacist about diabetes management. Stated she was not only interested in knowing about the diabetes management but she wanted to hear Meredith assessment of her overall. She was adamant about wanting to speak directly with Mallie and not another member of the staff.

## 2023-08-12 ENCOUNTER — Telehealth: Payer: Self-pay | Admitting: Primary Care

## 2023-08-12 DIAGNOSIS — R1314 Dysphagia, pharyngoesophageal phase: Secondary | ICD-10-CM | POA: Diagnosis not present

## 2023-08-12 DIAGNOSIS — R42 Dizziness and giddiness: Secondary | ICD-10-CM | POA: Diagnosis not present

## 2023-08-12 DIAGNOSIS — H6122 Impacted cerumen, left ear: Secondary | ICD-10-CM | POA: Diagnosis not present

## 2023-08-12 DIAGNOSIS — J301 Allergic rhinitis due to pollen: Secondary | ICD-10-CM | POA: Diagnosis not present

## 2023-08-12 DIAGNOSIS — H903 Sensorineural hearing loss, bilateral: Secondary | ICD-10-CM | POA: Diagnosis not present

## 2023-08-12 NOTE — Telephone Encounter (Signed)
 Patient's daughter is asking why her mom needs a mamogram, does not understand why someone at her age needs one.

## 2023-08-12 NOTE — Telephone Encounter (Addendum)
 Please let patient's daughter know that she wanted one. We discussed this in great detail and she wanted to proceed. My recommendation was for the bone density scan which I do recommend. She does not have to do the mammogram.  Let them know that I will give one of them a call one day this week regarding additional questions from last week.

## 2023-08-13 ENCOUNTER — Encounter: Payer: Self-pay | Admitting: Pharmacist

## 2023-08-13 NOTE — Telephone Encounter (Signed)
 Unable to reach patients daughter, Angeline. Left voicemail to return call to our office.

## 2023-08-14 NOTE — Telephone Encounter (Addendum)
Called patients daughter and reviewed all information. She verbalized understanding. Will call if any further questions.  

## 2023-08-15 NOTE — Telephone Encounter (Signed)
 Spoke with patient's daughter regarding all concerns about her mother. All questions answered.   We will be getting pharmacy involved to help managed diabetes.

## 2023-08-19 ENCOUNTER — Encounter: Payer: Self-pay | Admitting: Pharmacist

## 2023-08-19 NOTE — Progress Notes (Signed)
 Brief Telephone Documentation Reason for Call: Patient's daughter returning call  Summary of Call: Zebedee Baptist, patients daughter, returned call to schedule appointment for [facility]   Follow Up: Scheduled for office visit Wednesday, 3:00 pm.  Daughter provided direct line for questions/concerns/scheduling   Manuelita FABIENE Kobs, PharmD Clinical Pharmacist [facility] [phone]

## 2023-08-21 ENCOUNTER — Ambulatory Visit: Admitting: Pharmacist

## 2023-08-21 DIAGNOSIS — E11319 Type 2 diabetes mellitus with unspecified diabetic retinopathy without macular edema: Secondary | ICD-10-CM

## 2023-08-21 NOTE — Progress Notes (Unsigned)
 08/26/2023 Name: Laura Ferguson MRN: [id number] DOB: February 20, 1932  Subjective  Chief Complaint  Patient presents with   Diabetes   Reason for visit: ?  Laura Ferguson is a 89 y.o. female with a history of diabetes (type 2), who presents today for an initial diabetes pharmacotherapy visit.? Pertinent PMH also includes CAD, HTN, GERD, HLD, dizziness/vertigo.  Known DM Complications: nephropathy, retinopathy  Care Team: Primary Care Provider: Clark, Katherine K, NP  Medication Access/Adherence: Prescription drug coverage: Payor: HUMANA MEDICARE / Plan: HUMANA MEDICARE HMO / Product Type: *No Product type* / .  - Reports that all medications are affordable.  - Medication Adherence: Patient denies missing doses of their medication.    Since Last visit / History of Present Illness: ?  Patient reports implementing plan from last visit with PCP. Denies missing any doses of insulin . Denies self-adjusting doses of insulin .   Reported DM Regimen: ?  Lantus  30 units once daily    DM medications tried in the past:?  Metformin  (renal dysfunction) Glipizide  Soliqua  (lows 2/2 self-adjusting dose)  SMBG: Libre 3+ (reader) ?  Downloaded today.  CGM Report (Date Range 7/24 - 8/6) ABG 283 mg/dL; GMI 89.8%; Variability 30%; TIR 15%, high 16%, very high 69%, low 0%, very low 0%     Hypo/Hyperglycemia: ?  Symptoms of hypoglycemia since last visit:? x2 instances in the past couple of weeks. High 60s Peppermints/hard candy when <65.  Symptoms of hyperglycemia since last visit:? denies  Urinates one time per night usually.   Reported Diet: Patient typically eats ~2 meals per day Wakes ~7 am. Bedtime = ~midnight. Breakfast (8am): oatmeal, cheerios, egg, sometimes french toast Lunch: Often skips (States she skips lunch due to business rather than not wanting to eat/low appetite.Gets busy doing something and wants to finish) Dinner: Photographer of meat/chicken wings, mashed potaties, mixed  veggies Beverages: Gingerale (diet), coffee (sweet n low)  DM Prevention:  Statin: Taking; moderate intensity.?  History of chronic kidney disease? yes History of albuminuria? No record of UACR ACE/ARB - Taking losartan  100 mg/d; Urine MA/CR Ratio - Never checked.  Last eye exam: 03/07/22; Retinopathy Present Last foot exam: 05/09/2023 Tobacco Use: Never smoker   Cardiovascular Risk Reduction History of clinical ASCVD? yes History of heart failure? no History of hyperlipidemia? yes Current BMI: 20.9 kg/m2 (Ht 63 in, Wt 53.5 kg) Taking statin? yes; moderate intensity (rosuvastatin  10) Taking aspirin? indicated (secondary prevention); Taking   Taking SGLT-2i? no Taking GLP- 1 RA? no      _______________________________________________  Objective    Review of Systems:? Limited in the setting of virtual visit  Constitutional:? No fever, chills or unintentional weight loss  GI:? No nausea, vomiting, constipation, diarrhea, abdominal pain, dyspepsia, change in bowel habits  Endocrine:? No polyuria, polyphagia or blurred vision    Physical Examination:  Vitals:  Wt Readings from Last 3 Encounters:  08/06/23 118 lb (53.5 kg)  07/25/23 120 lb (54.4 kg)  06/04/23 123 lb (55.8 kg)   BP Readings from Last 3 Encounters:  08/22/23 111/60  08/06/23 128/72  07/25/23 (!) 94/59   Pulse Readings from Last 3 Encounters:  08/06/23 (!) 58  07/25/23 93  07/02/23 84     Labs:?  Lab Results  Component Value Date   HGBA1C 9.4 (A) 08/06/2023   HGBA1C 9.4 (A) 05/09/2023   HGBA1C 8.3 (A) 02/08/2023   GLUCOSE 325 (A) 06/04/2023   CREATININE 1.32 (H) 05/22/2023   CREATININE 1.60 (H) 09/21/2022   CREATININE  1.52 (H) 09/21/2022   GFR 29.59 (L) 07/03/2022   GFR 36.03 (L) 09/28/2021   GFR 33.42 (L) 04/06/2021    Lab Results  Component Value Date   CHOL 126 05/22/2023   LDLCALC 42 05/22/2023   LDLCALC 39 02/02/2022   LDLCALC 104 (H) 04/06/2021   HDL 71 05/22/2023   TRIG 58 05/22/2023    TRIG 63 02/02/2022   TRIG 121.0 04/06/2021   ALT 28 05/22/2023   ALT 38 (H) 09/21/2022   AST 26 05/22/2023   AST 29 09/21/2022      Chemistry      Component Value Date/Time   NA 141 05/22/2023 1119   K 5.4 (H) 05/22/2023 1119   CL 105 05/22/2023 1119   CO2 18 (L) 05/22/2023 1119   BUN 31 (H) 05/22/2023 1119   CREATININE 1.32 (H) 05/22/2023 1119   CREATININE 1.52 (H) 09/21/2022 1642      Component Value Date/Time   CALCIUM  9.7 05/22/2023 1119   ALKPHOS 91 05/22/2023 1119   AST 26 05/22/2023 1119   ALT 28 05/22/2023 1119   BILITOT 0.5 05/22/2023 1119       The ASCVD Risk score (Arnett DK, et al., 2019) failed to calculate for the following reasons:   The 2019 ASCVD risk score is only valid for ages 54 to 83  Assessment and Plan:   1. Diabetes, type 2: severely uncontrolled per last A1c of 9.4% (08/06/23), unchanged from previous. Goal <7.5% without hypoglycemia. Reasonable goal <8% if unable to achieve 7.5% given medical complexity (cognitive changes, significant hx hypoglycemia, hx self-adjustment of medication doses).   Non-insulin  therapies have been limited by renal dysfunction and hx self-adjustment of medication resulting in significant/frequent lows. At this point, insulin  adjustment is limited by fasting at goal some days though w mealtime choices frequently driving sugars >599 with fastings >200 mg/dL. Would benefit from prandial coverage though do not feel additional insulin  rx is a safe choice at this time.  CGM data No further lows which is a significant improvement though overall glycemic control significantly worsened with GMI 10.1%.  Current Regimen: Lantus  30 units daily  Diet: Inconsistent meal patterns. Frequent skipping of meals places her at risk of lows with insulin  titration. Continue Lantus  30 units daily Start Tradjenta  5 mg once daily. No renal adjustments needed. Reviewed s/sx/tx hypoglycemia Future Consideration: GLP1-RA:  With ongoing weight  reduction 2/2 skipping meals, would have hesitations with GLP1. Xultophy is technically available via Novo PAP. (Basal insulin +victoza) DPP4i: reasonable. Expect less A1c-benefit than GLP1 though also less weight reduction compared to GLP1 which is ideal. Okay in renal dysfunction.  SGLT2i: Reasonable though concern with hx dizziness/instability. Could consider losartan  reduction -- UACR not up to date, unclear if controlled.  Metformin : Renal dysfunction, not appropriate.  SU: Not ideal given risk of hypoglycemia on insulin  + frequently skips meals TZD: Possible increase in fracture risk with chronic use though not unreasonable to trial.     Follow Up Follow up with clinical pharmacist via phone in 3-4 weeks Patient given direct line for questions regarding medication therapy  Future Appointments  Date Time Provider Department Center  08/29/2023  1:00 PM Ermalinda Penton M, [place] [facility] None  09/04/2023  1:00 PM [facility]  09/04/2023  1:20 PM [facility]A [facility]  09/05/2023  3:00 PM Devra Lands, RN [facility] None  09/18/2023  2:00 PM [facility] PHARMACIST [facility]  09/23/2023  8:15 AM Galaway, Delon CROME, DPM [facility]  11/06/2023  2:20 PM Gretta Comer POUR, NP [facility]  11/20/2023  2:20 PM End, Lonni, MD [facility] None  01/23/2024 11:30 AM [facility] ANNUAL WELLNESS VISIT 1 [facility]    Manuelita FABIENE Kobs, PharmD Clinical Pharmacist [facility] [phone]

## 2023-08-22 ENCOUNTER — Other Ambulatory Visit: Payer: Self-pay

## 2023-08-22 NOTE — Patient Instructions (Addendum)
 Visit Information  Thank you for taking time to visit with me today. Please don't hesitate to contact me if I can be of assistance to you before our next scheduled appointment.  Your next care management appointment is by telephone on 09/05/2023 at 3:00 pm  Telephone follow-up 2 Seona Clemenson  Please call the care guide team at [phone] if you need to cancel, schedule, or reschedule an appointment.   Please call the Suicide and Crisis Lifeline: 988 call the [place]  National Suicide Prevention Lifeline: (385)607-4862 or TTY: (314)158-4842 TTY 340-052-2430) to talk to a trained counselor call 1-800-273-TALK (toll free, 24 hour hotline) go to [facility] [address], [place] [phone]) call 911 if you are experiencing a Mental Health or Behavioral Health Crisis or need someone to talk to.  Nestora Duos, MSN, RN [place]  [facility], [facility] RN Care Manager Direct Dial: [phone] Fax: [phone]  Diabetes Mellitus and Nutrition, Adult When you have diabetes, or diabetes mellitus, it is very important to have healthy eating habits because your blood sugar (glucose) levels are greatly affected by what you eat and drink. Eating healthy foods in the right amounts, at about the same times every day, can help you: Manage your blood glucose. Lower your risk of heart disease. Improve your blood pressure. Reach or maintain a healthy weight. What can affect my meal plan? Every person with diabetes is different, and each person has different needs for a meal plan. Your health care provider may recommend that you work with a dietitian to make a meal plan that is best for you. Your meal plan may vary depending on factors such as: The calories you need. The medicines you take. Your weight. Your blood glucose, blood pressure, and cholesterol levels. Your activity level. Other health conditions you have, such as heart or  kidney disease. How do carbohydrates affect me? Carbohydrates, also called carbs, affect your blood glucose level more than any other type of food. Eating carbs raises the amount of glucose in your blood. It is important to know how many carbs you can safely have in each meal. This is different for every person. Your dietitian can help you calculate how many carbs you should have at each meal and for each snack. How does alcohol affect me? Alcohol can cause a decrease in blood glucose (hypoglycemia), especially if you use insulin  or take certain diabetes medicines by mouth. Hypoglycemia can be a life-threatening condition. Symptoms of hypoglycemia, such as sleepiness, dizziness, and confusion, are similar to symptoms of having too much alcohol. Do not drink alcohol if: Your health care provider tells you not to drink. You are pregnant, may be pregnant, or are planning to become pregnant. If you drink alcohol: Limit how much you have to: 0-1 drink a day for women. 0-2 drinks a day for men. Know how much alcohol is in your drink. In the [place] one drink equals one 12 oz bottle of beer (355 mL), one 5 oz glass of wine (148 mL), or one 1 oz glass of hard liquor (44 mL). Keep yourself hydrated with water, diet soda, or unsweetened iced tea. Keep in mind that regular soda, juice, and other mixers may contain a lot of sugar and must be counted as carbs. What are tips for following this plan?  Reading food labels Start by checking the serving size on the Nutrition Facts label of packaged foods and drinks. The number of calories and the amount of carbs, fats, and  other nutrients listed on the label are based on one serving of the item. Many items contain more than one serving per package. Check the total grams (g) of carbs in one serving. Check the number of grams of saturated fats and trans fats in one serving. Choose foods that have a low amount or none of these fats. Check the number of milligrams  (mg) of salt (sodium) in one serving. Most people should limit total sodium intake to less than 2,300 mg per day. Always check the nutrition information of foods labeled as low-fat or nonfat. These foods may be higher in added sugar or refined carbs and should be avoided. Talk to your dietitian to identify your daily goals for nutrients listed on the label. Shopping Avoid buying canned, pre-made, or processed foods. These foods tend to be high in fat, sodium, and added sugar. Shop around the outside edge of the grocery store. This is where you will most often find fresh fruits and vegetables, bulk grains, fresh meats, and fresh dairy products. Cooking Use low-heat cooking methods, such as baking, instead of high-heat cooking methods, such as deep frying. Cook using healthy oils, such as olive, canola, or sunflower oil. Avoid cooking with butter, cream, or high-fat meats. Meal planning Eat meals and snacks regularly, preferably at the same times every day. Avoid going long periods of time without eating. Eat foods that are high in fiber, such as fresh fruits, vegetables, beans, and whole grains. Eat 4-6 oz (112-168 g) of lean protein each day, such as lean meat, chicken, fish, eggs, or tofu. One ounce (oz) (28 g) of lean protein is equal to: 1 oz (28 g) of meat, chicken, or fish. 1 egg.  cup (62 g) of tofu. Eat some foods each day that contain healthy fats, such as avocado, nuts, seeds, and fish. What foods should I eat? Fruits Berries. Apples. Oranges. Peaches. Apricots. Plums. Grapes. Mangoes. Papayas. Pomegranates. Kiwi. Cherries. Vegetables Leafy greens, including lettuce, spinach, kale, chard, collard greens, mustard greens, and cabbage. Beets. Cauliflower. Broccoli. Carrots. Green beans. Tomatoes. Peppers. Onions. Cucumbers. Brussels sprouts. Grains Whole grains, such as whole-wheat or whole-grain bread, crackers, tortillas, cereal, and pasta. Unsweetened oatmeal. Quinoa. Brown or  wild rice. Meats and other proteins Seafood. Poultry without skin. Lean cuts of poultry and beef. Tofu. Nuts. Seeds. Dairy Low-fat or fat-free dairy products such as milk, yogurt, and cheese. The items listed above may not be a complete list of foods and beverages you can eat and drink. Contact a dietitian for more information. What foods should I avoid? Fruits Fruits canned with syrup. Vegetables Canned vegetables. Frozen vegetables with butter or cream sauce. Grains Refined white flour and flour products such as bread, pasta, snack foods, and cereals. Avoid all processed foods. Meats and other proteins Fatty cuts of meat. Poultry with skin. Breaded or fried meats. Processed meat. Avoid saturated fats. Dairy Full-fat yogurt, cheese, or milk. Beverages Sweetened drinks, such as soda or iced tea. The items listed above may not be a complete list of foods and beverages you should avoid. Contact a dietitian for more information. Questions to ask a health care provider Do I need to meet with a certified diabetes care and education specialist? Do I need to meet with a dietitian? What number can I call if I have questions? When are the best times to check my blood glucose? Where to find more information: American Diabetes Association: diabetes.org Academy of Nutrition and Dietetics: eatright.[employer] of Diabetes and Digestive and  Kidney Diseases: StageSync.si Association of Diabetes Care & Education Specialists: diabeteseducator.org Summary It is important to have healthy eating habits because your blood sugar (glucose) levels are greatly affected by what you eat and drink. It is important to use alcohol carefully. A healthy meal plan will help you manage your blood glucose and lower your risk of heart disease. Your health care provider may recommend that you work with a dietitian to make a meal plan that is best for you. This information is not intended to replace advice  given to you by your health care provider. Make sure you discuss any questions you have with your health care provider. Document Revised: 08/04/2019 Document Reviewed: 08/05/2019 Elsevier Patient Education  2024 Elsevier Inc.  Diabetes Action Plan A diabetes action plan is a way for you to manage your symptoms of diabetes, also called diabetes mellitus. The plan is color-coded to guide you on what actions to take based on any symptoms you're having. If you have symptoms in the red zone, you need medical care right away. If you have symptoms in the yellow zone, your diabetes isn't under control, and you may need to make some changes. If you have symptoms in the green zone, you're doing well. Understanding diabetes can take time. Follow the treatment plan that you created with your health care provider. Know the target range for your blood sugar, also called glucose. Review your plan each time you visit your provider. The target range for my blood sugar level is __________________________ mg/dL. Red zone Get medical help right away if you have any of the following symptoms: A blood sugar test result that's below 54 mg/dL (3 mmol/L). A blood sugar test result that's at or above 240 mg/dL (86.6 mmol/L) for 2 days in a row along with: Extreme thirst and frequent peeing. Confusion or trouble thinking clearly. Moderate or large ketone levels in your pee (urine). Feeling tired or having no energy. Trouble breathing. Sickness or a fever for 2 or more days that's not getting better. These symptoms may be an emergency. Call 911 right away. Do not wait to see if the symptoms will go away. Do not drive yourself to the hospital. If you have very low blood sugar, also called severe hypoglycemia, and you can't eat or drink, you may need glucagon. Make sure a family member or close friend knows how to check your blood sugar and how to give you glucagon. You may need to be treated in a hospital for this  condition. Yellow zone If you have any of the following symptoms, your diabetes isn't under control, and you may need to make some changes: A blood sugar test result that's at or above 240 mg/dL (86.6 mmol/L) for 2 days in a row. Blood sugar test results that are below 70 mg/dL (3.9 mmol/L). Other symptoms of hypoglycemia, such as: Shaking or feeling light-headed. Confusion or irritability. Feeling hungry. Having a fast heartbeat. If you have any yellow zone symptoms: Treat your hypoglycemia by eating or drinking 15 grams of a rapid-acting carbohydrate. Follow the 15:15 rule: Take 15 grams of a rapid-acting carbohydrate, such as: 1 tube of glucose gel. 4 glucose pills. 4 oz (120 mL) of fruit juice. 4 oz (120 mL) of regular (not diet) soda. Check your blood sugar again 15 minutes after you take the carbohydrate. If the second blood sugar test is still at or below 70 mg/dL (3.9 mmol/L), take 15 grams of a carbohydrate again. If your blood sugar doesn't increase above  70 mg/dL (3.9 mmol/L) after 3 tries, get medical help right away. After your blood sugar returns to normal, eat a meal or a snack within 1 hour. Keep taking your daily medicines as told by your provider. Check your blood sugar more often than you normally would. Write down your results. Call your provider if you have trouble keeping your blood sugar in your target range. Green zone These signs mean you're doing well and can continue what you're doing to manage your diabetes: Your blood sugar is within your personal target range. For most people, a blood sugar level before a meal should be 80-130 mg/dL (4.4-7.2 mmol/L). You feel well, and you're able to do daily activities. If you're in the green zone, continue to manage your diabetes as told by your provider. To do this: Eat a healthy diet. Exercise regularly. Check your blood sugar as told. Take your medicines only as told. Where to find more information American  Diabetes Association (ADA): diabetes.org Association of Diabetes Care & Education Specialists (ADCES): adces.org/diabetes-education-dsmes This information is not intended to replace advice given to you by your health care provider. Make sure you discuss any questions you have with your health care provider. Document Revised: 08/22/2022 Document Reviewed: 08/22/2022 Elsevier Patient Education  2024 Elsevier Inc.   Prolonged Grief Grief is a normal response to the death of someone close to you. Feelings of fear, anger, and guilt can affect almost everyone who loses a loved one. It is also common to have symptoms of depression while you are grieving. These include problems with sleep, loss of appetite, and lack of energy. They may last for Emmauel Hallums or months after a loss. Prolonged grief is different from normal grief or depression. Normal grieving involves sadness and feelings of loss, but those feelings get better and heal over time. Prolonged grief is a severe type of grief that lasts for a long time, usually for several months to a year or longer. It interferes with your ability to function normally. Prolonged grief may require treatment from a mental health care provider. What are the causes? The cause of this condition is not known. It is not clear why some people continue to struggle with grief and others do not. What increases the risk? You are more likely to develop this condition if: The death of your loved one was sudden or unexpected. The death of your loved one was due to a violent event. Your loved one died from suicide. Your loved one was a child or a young person. You were very close to your loved one, or you were dependent on him or her. You have a history of depression or anxiety. You have very little to no support from others. What are the signs or symptoms? Symptoms of this condition include: Feeling disbelief or having a lack of emotion (numbness). Being unable to enjoy good  memories of your loved one. Needing to avoid anything or anyone that reminds you of your loved one. Being unable to stop thinking about the death. Feeling intense anger, loneliness, helplessness, or guilt. Feeling that your life is meaningless and empty, and having trouble moving on with your life. How is this diagnosed? This condition may be diagnosed based on: Your symptoms. Prolonged grief will be diagnosed if you have ongoing symptoms of grief for 6 months for children and 12 months or longer for adults. The effect of symptoms on your life. You may be diagnosed with this condition if your symptoms are interfering with your ability  to live your life. Your health care provider may recommend that you see a mental health care provider. Many symptoms of depression are similar to the symptoms of prolonged grief. It is important to be evaluated for prolonged grief along with other mental health conditions. How is this treated? This condition is most commonly treated with talk therapy. This therapy is offered by a mental health specialist (psychiatrist). During therapy: You will learn healthy ways to cope with the loss of your loved one. Your mental health care provider may recommend antidepressant medicines. Follow these instructions at home: Lifestyle  Take care of yourself. Eat on a regular basis, and maintain a healthy diet. Eat plenty of fruits, vegetables, lean protein, and whole grains. Try to get some exercise each day. Aim for 30 minutes of exercise on most days of the week. Keep a consistent sleep schedule. Try to get 8 or more hours of sleep each night. Start doing the things that you used to enjoy. Do not use drugs or alcohol to ease your symptoms. Spend time with friends and loved ones. General instructions Take over-the-counter and prescription medicines only as told by your health care provider. Consider joining a grief (bereavement) support group to help you deal with your  loss. Keep all follow-up visits. This is important. Contact a health care provider if: Your symptoms prevent you from functioning normally. Your symptoms do not get better with treatment. Get help right away if: You have serious thoughts about hurting yourself or someone else. You have suicidal feelings. Get help right away if you feel like you may hurt yourself or others, or have thoughts about taking your own life. Go to your nearest emergency room or: Call 911. Call the National Suicide Prevention Lifeline at (808)874-6453 or 988. This is open 24 hours a day. Text the Crisis Text Line at 850-663-0164. Summary Prolonged grief is a severe type of grief that lasts for a long time. This grief is not likely to go away on its own. Get the help you need. Some griefs are more difficult than others and can cause this condition. You may need a certain type of treatment to help you recover if the loss of your loved one was sudden, violent, or due to suicide. You may feel guilty about moving on with your life. Getting help does not mean that you are forgetting your loved one. It means that you are taking care of yourself. Prolonged grief is best treated with talk therapy. Medicines may also be prescribed. Seek the help you need, and find support that will help you recover. This information is not intended to replace advice given to you by your health care provider. Make sure you discuss any questions you have with your health care provider. Document Revised: 08/22/2020 Document Reviewed: 08/22/2020 Elsevier Patient Education  2024 [employer].

## 2023-08-22 NOTE — Patient Outreach (Signed)
 Complex Care Management   Visit Note  08/22/2023  Name:  Laura Ferguson MRN: [id number] DOB: May 21, 1932  Situation: Referral received for Complex Care Management related to Diabetes with Complications and Depression I obtained verbal consent from Patient.  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   LIMA to LAD, SVG to ramus intermedius, SVG to OM1 & OM2, SVG to acute marginal, SVG to distal RCA   Type 2 diabetes mellitus (HCC)     Assessment: Patient Reported Symptoms:  Cognitive Cognitive Status: Normal speech and language skills, Alert and oriented to person, place, and time Cognitive/Intellectual Conditions Management [RPT]: None reported or documented in medical history or problem list   Health Maintenance Behaviors: Annual physical exam, Spiritual practice(s), Healthy diet Healing Pattern: Average Health Facilitated by: Prayer/meditation, Rest  Neurological Neurological Review of Symptoms: Dizziness Neurological Comment: reports dizziness at times, checks CGM to verify BG not low or trending down, gets up slowly  HEENT HEENT Symptoms Reported: Not assessed      Cardiovascular Cardiovascular Symptoms Reported: No symptoms reported Does patient have uncontrolled Hypertension?: No Cardiovascular Management Strategies: Routine screening, Medication therapy Cardiovascular Comment: reports checking BP before BP meds will delay if low  Respiratory      Endocrine Endocrine Symptoms Reported: No symptoms reported Is patient diabetic?: Yes Is patient checking  blood sugars at home?: Yes List most recent blood sugar readings, include date and time of day: CGM, initially stated changed last night and not working but was able to provide FBG and current BG - FBG 63, treated low appropriately, range BG 63 to 352, discussed unchanged A1C, discouraged, states tries ot do what she is supposed to re diet and takes her meds, support provided Endocrine Self-Management Outcome: 3 (uncertain)  Gastrointestinal Gastrointestinal Symptoms Reported: No symptoms reported Gastrointestinal Management Strategies: Medication therapy    Genitourinary Genitourinary Symptoms Reported: Other Other Genitourinary Symptoms: reports ovary/pelvic pain, PCP notified by RNCM, no pain or changes with urination    Integumentary Integumentary Symptoms Reported: Not assessed    Musculoskeletal Musculoskelatal Symptoms Reviewed: Not assessed Musculoskeletal Management Strategies: Medication therapy Musculoskeletal Comment: tylenol prn Falls in the past year?: No Number of falls in past year: 1 or less Was there an injury with Fall?: No Fall Risk Category Calculator: 0 Patient Fall Risk Level: Low Fall Risk Patient at Risk for Falls Due to: Impaired balance/gait, Impaired vision, Orthopedic patient Fall risk Follow up: Falls evaluation completed, Falls prevention discussed  Psychosocial Psychosocial Symptoms Reported: Depression - if selected complete PHQ 2-9, Report of significant loss, deaths, abandonment, traumatic incidents, Sadness - if selected complete PHQ 2-9 Additional Psychological Details: Mood was significant focus of visit - patient report getting out and enjoying self at times but depressive thoughts regarding losses/death of children, own health, initially very evasive and declining to discuss then stated people taking advantage of her kindness, try to pull the wool over my eyes would not elaborate, denied abuse, not afraid of anyone. Denied any intention to harm self -  I would  be hell bound, no forgiveness for that. Declined to take Sertraline  regulalry - wants to manage herself despite discussion regarding need ot take regularly to have any affect on mood. Aware LCSW call 8/14 Behavioral Management Strategies: Coping strategies, Exercise (Spiritual beliefs) Behavioral Health Self-Management Outcome: 3 (uncertain) Major Change/Loss/Stressor/Fears (CP): Medical condition, self, Death of a loved one Techniques to Cope with Loss/Stress/Change: Diversional activities, Spiritual practice(s) Quality of Family Relationships: helpful, involved, supportive Do you feel physically threatened by others?: No      08/22/2023   11:41 AM  Depression screen PHQ 2/9  Decreased Interest 0  Down, Depressed, Hopeless 1  PHQ - 2 Score 1  Suicidal thoughts 0    Vitals:   08/22/23 1127  BP: 111/60    Medications Reviewed Today     Reviewed by Devra Lands, RN (Registered Nurse) on 08/22/23 at 1115  Med List Status: <None>   Medication Order Taking? Sig Documenting Provider Last Dose Status Informant  Acetaminophen (TYLENOL 8 HOUR PO) [id number] Yes Take 1 tablet by mouth every 8 (eight) hours as needed.  [provider]  Active Self  aspirin 81 MG tablet [id number] Yes Take 81 mg by mouth daily. [provider]  Active Self  Azelastine HCl 137 MCG/SPRAY SOLN [id number]  As needed  Patient not taking: Reported on 08/22/2023   [provider]  Active   Blood Glucose Monitoring Suppl (ACCU-CHEK GUIDE) w/Device KIT [id number] Yes Use as instructed to check blood sugar. Trixie File, MD  Active   Cholecalciferol (VITAMIN D) 50 MCG (2000 UT) CAPS [id number] Yes Take 1 capsule by mouth daily. [provider]  Active   Continuous Glucose Sensor (FREESTYLE LIBRE 3 PLUS SENSOR) MISC [id number] Yes Change sensor every 15 days. Gretta Comer POUR, NP  Active   Continuous Glucose Sensor (FREESTYLE LIBRE 3 SENSOR) [place] [id number]  APPLY SENSOR  EVERY 14 DAYS TO CONTINOUSLY MONITOR GLUCOSE Clark, Katherine K, NP  Active   ezetimibe  (ZETIA ) 10 MG tablet [id number] Yes Take 1 tablet (10 mg total) by mouth daily. Burnard Debby LABOR, MD  Active   fexofenadine (ALLEGRA) 180 MG tablet [id number] Yes Take 180 mg by mouth daily. [provider]  Active   glucose blood (ACCU-CHEK GUIDE) test strip [id number] Yes USE TO CHECK SUGAR THREE TIMES DAILY AS DIRECTED Trixie File, MD  Active   insulin  glargine (LANTUS  SOLOSTAR) 100 UNIT/ML Solostar Pen [id number] Yes Inject 30 Units into the skin daily. for diabetes. Gretta Comer POUR, NP  Active   Insulin  Pen Needle (BD PEN NEEDLE NANO 2ND GEN) 32G X 4 MM MISC [id number] Yes USE 1 PEN NEEDLE WITH INSULIN  PEN once daily Clark, Katherine K, NP  Active   losartan  (COZAAR ) 100 MG tablet [id number] Yes Take 1 tablet (100 mg total) by mouth daily. Burnard Debby LABOR, MD  Active   metoprolol  succinate (TOPROL -XL) 50 MG 24 hr tablet [id number] Yes TAKE 1 TABLET BY MOUTH IN THE MORNING AND AT BEDTIME TAKE  WITH  OR  IMMEDIATELY  FOLLOWING  A  MEAL Burnard Debby LABOR, MD  Active   mometasone (NASONEX) 50 MCG/ACT nasal spray [id number]  Place 2 sprays into the nose daily.  Patient not taking: Reported on 08/06/2023   [provider]  Active   Multiple Vitamin (MULTIVITAMIN) capsule [id number] Yes Take 1 capsule by mouth daily. [provider]  Active Self  pantoprazole  (PROTONIX ) 40 MG tablet [id number] Yes Take 1 tablet by mouth once daily Burnard Debby LABOR, MD  Active  Polyethyl Glycol-Propyl Glycol (SYSTANE OP) [id number] Yes Apply 2 drops to eye 2 (two) times daily.  [provider]  Active Self  rosuvastatin  (CRESTOR ) 10 MG tablet [id number] Yes Take 1 tablet (10 mg total) by mouth daily. for cholesterol. Burnard Debby LABOR, MD  Active   sertraline  (ZOLOFT ) 25 MG tablet [id number] Yes Take 1 tablet (25 mg total) by mouth daily. for anxiety and depression.  Patient taking differently: Take 25 mg by  mouth daily. for anxiety and depression. Patient not taking regularly - discussed necessary to take daily to improve anxiety and depression, declined   Gretta, Katherine K, NP  Active   TRAVATAN Z 0.004 % SOLN ophthalmic solution [id number] Yes Place 1 drop into both eyes at bedtime. [provider]  Active Self           Med Note FELIPE, ELEANOR KATHEE Repress Dec 05, 2014  2:09 PM)              Recommendation:   PCP Follow-up LCSW follow up 08/29/2023  Follow Up Plan:   Telephone follow-up 09/05/2023 at 3:00 pm  Nestora Duos, MSN, RN [place]  [facility], [facility] Health RN Care Manager Direct Dial: [phone] Fax: [phone]

## 2023-08-26 MED ORDER — LINAGLIPTIN 5 MG PO TABS
5.0000 mg | ORAL_TABLET | Freq: Every day | ORAL | 0 refills | Status: DC
Start: 2023-08-26 — End: 2023-11-25

## 2023-08-28 ENCOUNTER — Other Ambulatory Visit: Payer: Self-pay

## 2023-08-28 NOTE — Progress Notes (Signed)
 Spoke with patient, Laura Ferguson, via telephone regarding new prescription for Tradjenta  (liraglutide) 5 mg tablets.   Patient reports having difficulty with her Libre sensor yesterday, but has been able to receive readings this morning. Her post-prandial glucose was 243 mg/dL, patient had not taken her Lantus  for the day, but was planning on injecting after the call.   In regards to Tradjenta , counseled the patient on how to properly take the medication (one pill once daily, with or without regards to meals), adverse effects of the medication, and the pharmacy that it was sent to.   Patient states that she will be able to pick up the prescription sometime today, and is planning on starting it tomorrow.   Provided the patient with opportunity to ask questions and encouragement to call the back if she had any further concerns.   Woodie Jock, PharmD PGY1 Pharmacy Resident

## 2023-08-29 ENCOUNTER — Other Ambulatory Visit: Payer: Self-pay | Admitting: *Deleted

## 2023-08-29 NOTE — Patient Outreach (Signed)
 Complex Care Management   Visit Note  08/29/2023  Name:  Laura Ferguson MRN: [id number] DOB: [birth year]  Situation: Referral received for Complex Care Management related to Mental/Behavioral Health diagnosis , depression-Grief and Loss. Patient confirms improvement in moodI obtained verbal consent from Patient.  Visit completed with patient  on the phone.  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   LIMA to LAD, SVG to ramus intermedius, SVG to OM1 & OM2, SVG to acute marginal, SVG to distal RCA   Type 2 diabetes mellitus (HCC)     Assessment: Patient Reported Symptoms:  Cognitive Cognitive Status: Normal speech and language skills, Alert and oriented to person, place, and time Cognitive/Intellectual Conditions Management [RPT]: None reported or documented in medical history or problem list   Health Maintenance Behaviors: Annual physical exam, Spiritual practice(s) Healing Pattern: Average Health Facilitated by: Prayer/meditation  Neurological Neurological Review of Symptoms: Dizziness Neurological Management Strategies: Routine screening Neurological Comment: reports continued dizziness, reports making efforts to increase fluids and to eat additional protein  HEENT HEENT Symptoms Reported: No symptoms reported HEENT Management Strategies: Routine screening, Medication therapy HEENT Comment: receives eye injection-next appt in September    Cardiovascular Cardiovascular Symptoms Reported: No symptoms reported    Respiratory      Endocrine Endocrine Symptoms Reported: No symptoms reported Is patient  diabetic?: Yes Is patient checking blood sugars at home?: Yes List most recent blood sugar readings, include date and time of day: uses free style libre-working with pharmacist on usage-picking up medication today Blood sugar was 123 fasting 08/29/23 7:00am Endocrine Self-Management Outcome: 3 (uncertain)  Gastrointestinal Gastrointestinal Symptoms Reported: No symptoms reported      Genitourinary Genitourinary Symptoms Reported: No symptoms reported    Integumentary Integumentary Symptoms Reported: No symptoms reported    Musculoskeletal Musculoskelatal Symptoms Reviewed: No symptoms reported        Psychosocial Psychosocial Symptoms Reported: Depression - if selected complete PHQ 2-9 Additional Psychological Details: Continues to report continued depressed mood-reports increased anxiety with the amount of medical appointments-does not want to get out bed, tearful Behavioral Management Strategies: Coping strategies, Exercise Major Change/Loss/Stressor/Fears (CP): Medical condition, self Behaviors When Feeling Stressed/Fearful: getting out more, prioritizing, visiitng with familly, prayer Techniques to Cope with Loss/Stress/Change: Diversional activities, Spiritual practice(s) Quality of Family Relationships: helpful, involved, supportive Do you feel physically threatened by others?: No      08/22/2023   11:41 AM  Depression screen PHQ 2/9  Decreased Interest 0  Down, Depressed, Hopeless 1  PHQ - 2 Score 1  Suicidal thoughts 0    There were no vitals filed for this visit.  Medications Reviewed Today     Reviewed by Ermalinda Lenn HERO, LCSW (Social Worker) on 08/29/23 at 1313  Med List Status: <None>   Medication Order Taking? Sig Documenting Provider Last Dose Status Informant  Acetaminophen (TYLENOL 8 HOUR PO) [id number]  Take 1 tablet by mouth every 8 (eight) hours as needed.  [provider]  Active Self  aspirin 81 MG tablet [id number]  Take 81 mg by mouth daily.  [provider]  Active Self  Azelastine HCl 137 MCG/SPRAY SOLN [id number]  As needed  Patient not taking: Reported on 08/22/2023   [provider]  Active   Blood Glucose Monitoring Suppl (ACCU-CHEK GUIDE) w/Device KIT [id number]  Use as instructed to check blood sugar. Trixie File, MD  Active   Cholecalciferol (VITAMIN D) 50 MCG (2000 UT) CAPS [id number]  Take 1 capsule by mouth daily. [provider]  Active   Continuous Glucose Sensor (FREESTYLE LIBRE 3 PLUS SENSOR) MISC [id number]  Change sensor every 15 days. Gretta Comer POUR, NP  Active   Continuous Glucose Sensor (FREESTYLE LIBRE 3 SENSOR) [place] [id number]  APPLY SENSOR EVERY 14 DAYS TO CONTINOUSLY MONITOR GLUCOSE Gretta Comer POUR, NP  Active   ezetimibe  (ZETIA ) 10 MG tablet [id number]  Take 1 tablet (10 mg total) by mouth daily. Burnard Debby LABOR, MD  Active   fexofenadine [facility]) 180 MG tablet [id number]  Take 180 mg by mouth daily. [provider]  Active   glucose blood (ACCU-CHEK GUIDE) test strip [id number]  USE TO CHECK SUGAR THREE TIMES DAILY AS DIRECTED Trixie File, MD  Active   insulin  glargine (LANTUS  SOLOSTAR) 100 UNIT/ML Solostar Pen [id number]  Inject 30 Units into the skin daily. for diabetes. Gretta Comer POUR, NP  Active   Insulin  Pen Needle (BD PEN NEEDLE NANO 2ND GEN) 32G X 4 MM MISC [id number]  USE 1 PEN NEEDLE WITH INSULIN  PEN once daily Clark, Katherine K, NP  Active   linagliptin  (TRADJENTA ) 5 MG TABS tablet [id number]  Take 1 tablet (5 mg total) by mouth daily. for diabetes. Clark, Katherine K, NP  Active   losartan  (COZAAR ) 100 MG tablet [id number]  Take 1 tablet (100 mg total) by mouth daily. Burnard Debby LABOR, MD  Active   metoprolol  succinate (TOPROL -XL) 50 MG 24 hr tablet [id number]  TAKE 1 TABLET BY MOUTH IN THE MORNING AND AT BEDTIME TAKE  WITH  OR  IMMEDIATELY  FOLLOWING  A  MEAL Burnard Debby LABOR, MD  Active   mometasone (NASONEX) 50 MCG/ACT nasal spray [id number]  Place 2  sprays into the nose daily.  Patient not taking: Reported on 08/06/2023   [provider]  Active   Multiple Vitamin (MULTIVITAMIN) capsule [id number]  Take 1 capsule by mouth daily. [provider]  Active Self  pantoprazole  (PROTONIX ) 40 MG tablet [id number]  Take 1 tablet by mouth once daily Burnard Debby LABOR, MD  Active   Polyethyl Glycol-Propyl Glycol (SYSTANE OP) [id number]  Apply 2 drops to eye 2 (two) times daily.  [provider]  Active Self  rosuvastatin  (CRESTOR ) 10 MG tablet [id number]  Take 1 tablet (10 mg total) by mouth daily. for cholesterol. Burnard Debby LABOR, MD  Active   sertraline  (ZOLOFT ) 25 MG tablet [id number]  Take 1 tablet (25 mg total) by mouth daily. for anxiety and depression.  Patient taking differently: Take 25 mg by mouth daily. for anxiety and depression. Patient not taking regularly - discussed necessary to take daily to improve anxiety and depression, declined   Gretta, Katherine K, NP  Active   TRAVATAN Z 0.004 % SOLN ophthalmic solution [id number]  Place 1 drop into both eyes at bedtime. [provider]  Active Self           Med Note FELIPE ELEANOR KATHEE Austin Dec 05, 2014  2:09 PM)              Recommendation:   PCP Follow-up 11/06/23 [facility] 09/05/23  Pharmacist 09/18/23    Follow Up Plan:   Telephone follow up appointment date/time:  09/11/23 10am  Kenidee Cregan Ermalinda HUGHS [place]  [facility], [facility] Licensed Clinical Social Worker  Direct Dial: [phone]

## 2023-08-29 NOTE — Patient Instructions (Signed)
 Visit Information  Thank you for taking time to visit with me today. Please don't hesitate to contact me if I can be of assistance to you before our next scheduled appointment.  Your next care management appointment is by telephone on 09/11/23 at 10am    Please call the care guide team at [phone] if you need to cancel, schedule, or reschedule an appointment.   Please call the Suicide and Crisis Lifeline: 988 call the [place]  National Suicide Prevention Lifeline: 6410029890 or TTY: (908)386-9802 TTY 647-072-3683) to talk to a trained counselor call 1-800-273-TALK (toll free, 24 hour hotline) if you are experiencing a Mental Health or Behavioral Health Crisis or need someone to talk to.  Jeannemarie Sawaya, LCSW [place]  [facility], [facility] Health Licensed Clinical Social Worker  Direct Dial: [phone]

## 2023-08-30 ENCOUNTER — Telehealth: Payer: Self-pay

## 2023-08-30 NOTE — Telephone Encounter (Signed)
 Spoke with patient, she has additional questions regarding recent phone call visit with Katrina Bitcon. Requesting a call back from pharmacy team.

## 2023-08-30 NOTE — Telephone Encounter (Signed)
 Copied from CRM #[id number]. Topic: General - Other >> Aug 30, 2023  2:43 PM Macario HERO wrote: Reason for CRM: Patient called requesting a call back from her provider's nurse regarding medical records. Patient was crying and I offered to speak with a nurse she declined.  Advised will call before the end of the day.

## 2023-09-02 ENCOUNTER — Telehealth: Payer: Self-pay

## 2023-09-02 DIAGNOSIS — H35033 Hypertensive retinopathy, bilateral: Secondary | ICD-10-CM | POA: Diagnosis not present

## 2023-09-02 DIAGNOSIS — H401132 Primary open-angle glaucoma, bilateral, moderate stage: Secondary | ICD-10-CM | POA: Diagnosis not present

## 2023-09-02 DIAGNOSIS — E113311 Type 2 diabetes mellitus with moderate nonproliferative diabetic retinopathy with macular edema, right eye: Secondary | ICD-10-CM | POA: Diagnosis not present

## 2023-09-02 DIAGNOSIS — Z961 Presence of intraocular lens: Secondary | ICD-10-CM | POA: Diagnosis not present

## 2023-09-02 DIAGNOSIS — R42 Dizziness and giddiness: Secondary | ICD-10-CM

## 2023-09-02 DIAGNOSIS — H43813 Vitreous degeneration, bilateral: Secondary | ICD-10-CM | POA: Diagnosis not present

## 2023-09-02 DIAGNOSIS — H35373 Puckering of macula, bilateral: Secondary | ICD-10-CM | POA: Diagnosis not present

## 2023-09-02 NOTE — Telephone Encounter (Signed)
 Copied from CRM #[id number]. Topic: General - Other >> Sep 02, 2023  2:22 PM Roselie BROCKS wrote: Reason for CRM: [facility] called for referral to be sent to [facility] Neurology for patient

## 2023-09-03 ENCOUNTER — Other Ambulatory Visit: Payer: Self-pay | Admitting: Pharmacist

## 2023-09-03 NOTE — Progress Notes (Signed)
 Brief Telephone Documentation Reason for Call: Patient left message regarding question for pharmacist  Summary of Call: Patient calls today tearful stating that she is overwhelmed with everything lately.  Notes she is taking a lot of pills, has been getting a lot of letters in the mail from [facility] and she is worried there may be issues with her coverage. She is confused about referrals/establishing care with [facility] cardiologist. Did not start her new diabetes medication because of how overwhelmed she has been in recent weeks.   Feels all of these things together have worsened her depression. Is tearful often. Feels burdensome to her healthcare providers and caregivers a lot of which stems from her wanting to be independent but struggling with the cumulation of everything.   Plan: Discussed pillpacks as a good option to take medication organization off of her plate though each time this was brought up, patient would drift to other concerns/worries. Continue to have this discussion at future encounters.   Advised her to have her daughters read some of the mail she has been getting from [facility]. If they do not understand the letters, she is welcome to call us  or leave a copy of the letter with the front desk.  Reviewed upcoming appointment dates with her in regard to PCP and cardiology later this year. Assured her she is currently scheduled w Dr. Mady at [facility] cards.   Future Appointments  Date Time Provider Department Center  09/05/2023  3:00 PM Devra Lands, RN [facility] None  09/11/2023 10:00 AM Land, [place] M, [place] [facility] None  09/18/2023  2:00 PM [facility] PHARMACIST [facility]  09/23/2023  8:15 AM Gaynel Delon CROME, DPM [facility]  11/06/2023  2:20 PM Gretta Comer POUR, NP [facility]  11/20/2023  2:20 PM End, Lonni, MD [facility] None  01/23/2024 11:30 AM [facility] ANNUAL WELLNESS VISIT 1 [facility]     Follow Up: Patient given direct line for further  questions/concerns.  Manuelita FABIENE Kobs, PharmD Clinical Pharmacist [facility] [phone]

## 2023-09-03 NOTE — Telephone Encounter (Addendum)
 Can we find out some additional information from [facility]? What is the reason for the referral? Was patient recently seen in their office? Can we get office notes? And do they mean Dr. Maree?

## 2023-09-03 NOTE — Telephone Encounter (Signed)
 Left voicemail for [facility] CMA to return call with clarification to the below questions.

## 2023-09-04 ENCOUNTER — Other Ambulatory Visit

## 2023-09-04 ENCOUNTER — Encounter

## 2023-09-05 ENCOUNTER — Telehealth: Payer: Self-pay

## 2023-09-05 ENCOUNTER — Other Ambulatory Visit: Payer: Self-pay | Admitting: Pharmacist

## 2023-09-05 NOTE — Telephone Encounter (Signed)
 Left voicemail for [facility] CMA to return call with clarification to the below questions.

## 2023-09-05 NOTE — Patient Instructions (Signed)
 [name] - I am sorry I was unable to reach you today for our scheduled appointment. I work with Gretta Comer POUR, NP and am calling to support your healthcare needs. Please contact me at [phone] at your earliest convenience. I look forward to speaking with you soon.   Thank you,  Nestora Duos, MSN, RN [facility]  [facility], [facility] RN Care Manager Direct Dial: [phone] Fax: [phone]

## 2023-09-05 NOTE — Progress Notes (Signed)
 Brief Telephone Documentation Reason for Call: Patient left message regarding letters in the mail from [place]  Summary of Call: Patient notes she read through the letters she has been getting in the mail.  She believes that they require signature/review by her PCP.  Reports her daughters are aware. Will plan to bring letters to clinic tomorrow or early next week.   Follow Up: Patient given direct line for further questions/concerns.  Manuelita FABIENE Kobs, PharmD Clinical Pharmacist [facility] [phone]

## 2023-09-05 NOTE — Telephone Encounter (Signed)
 Received call from [facility], they stated a fax has been sent over with the requested information.  Will be on the lookout for this to come through.

## 2023-09-10 NOTE — Telephone Encounter (Signed)
 See in media dated 09/03/23 notes from [facility], patient was seen in their office by Dr. Milissa on 08/12/23. Requesting referral to Dr. Maree at [facility] for Migraine/dizziness.

## 2023-09-11 ENCOUNTER — Other Ambulatory Visit: Payer: Self-pay | Admitting: *Deleted

## 2023-09-11 ENCOUNTER — Other Ambulatory Visit: Payer: Self-pay

## 2023-09-11 NOTE — Patient Outreach (Addendum)
 Complex Care Management   Visit Note  09/11/2023  Name:  Laura Ferguson MRN: [id number] DOB: [birth year]  Situation: Referral received for Complex Care Management related to Mental/Behavioral Health diagnosis , depression-Grief and Loss. Patient confirms improvement in mood I obtained verbal consent from Patient.  Visit completed with patient  on the phone.   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   LIMA to LAD, SVG to ramus intermedius, SVG to OM1 & OM2, SVG to acute marginal, SVG to distal RCA   Type 2 diabetes mellitus (HCC)     Assessment: Patient Reported Symptoms:  Cognitive Cognitive Status: Normal speech and language skills, Poor judgment in daily scenarios, Alert and oriented to person, place, and time Cognitive/Intellectual Conditions Management [RPT]: None reported or documented in medical history or problem list   Health Maintenance Behaviors: Annual physical exam, Spiritual practice(s) Healing Pattern: Average Health Facilitated by: Prayer/meditation  Neurological Neurological Review of Symptoms: Dizziness Neurological Management Strategies: Routine screening Neurological Self-Management Outcome: 4 (good) Neurological Comment: Reports that she stops and will steady herself and then keeps going  HEENT HEENT Symptoms Reported: No symptoms reported      Cardiovascular Cardiovascular Symptoms Reported: No symptoms reported    Respiratory Respiratory Symptoms Reported: No symptoms reported    Endocrine Endocrine Symptoms Reported: No symptoms reported Is patient diabetic?: Yes Is patient  checking blood sugars at home?: Yes List most recent blood sugar readings, include date and time of day: uses free style libre-09/11/23 10:21am 143 Endocrine Comment: patient states that she does not want to take the tradjeta-feels that she is on too much medicine-encouraged patient to take medication as prescribed and to discuss medication concerns with provider and pharmacist. Patient now agreeable to start taking the medication today  Gastrointestinal Gastrointestinal Symptoms Reported: No symptoms reported      Genitourinary Genitourinary Symptoms Reported: No symptoms reported    Integumentary Integumentary Symptoms Reported: No symptoms reported    Musculoskeletal Musculoskelatal Symptoms Reviewed: No symptoms reported        Psychosocial Psychosocial Symptoms Reported: Depression - if selected complete PHQ 2-9 Additional Psychological Details: Patient states that her mood has improved, she is getting out more , going to beach with her family this weekend,  cleaned out closet of 2 sons that passed away Behavioral Management Strategies: Coping strategies, Exercise Behavioral Health Self-Management Outcome: 4 (good) Major Change/Loss/Stressor/Fears (CP): Medical condition, self, Death of a loved one Behaviors When Feeling Stressed/Fearful: getting out more, visiting with family, prayer Techniques to [place] with Loss/Stress/Change: Diversional activities Quality of Family Relationships: helpful, involved, supportive Do you feel physically threatened by others?: No    09/11/2023    PHQ2-9 Depression Screening   Little interest or pleasure in doing things    Feeling down, depressed, or hopeless    PHQ-2 - Total Score    Trouble falling or staying asleep, or sleeping too much    Feeling tired or having little energy    Poor appetite or overeating     Feeling bad about yourself - or that you are a failure or  have let yourself or your family down    Trouble concentrating on things, such  as reading the newspaper or watching television    Moving or speaking so slowly that other people could have noticed.  Or the opposite - being so fidgety or restless that you have been moving around a lot more than usual    Thoughts that you would be better off dead, or hurting yourself in some way    PHQ2-9 Total Score    If you checked off any problems, how difficult have these problems made it for you to do your work, take care of things at home, or get along with other people    Depression Interventions/Treatment      There were no vitals filed for this visit.  Medications Reviewed Today     Reviewed by Ermalinda Lenn HERO, LCSW (Social Worker) on 09/11/23 at 1026  Med List Status: <None>   Medication Order Taking? Sig Documenting Provider Last Dose Status Informant  Acetaminophen (TYLENOL 8 HOUR PO) [id number] Yes Take 1 tablet by mouth every 8 (eight) hours as needed.  [provider]  Active Self  aspirin 81 MG tablet [id number] Yes Take 81 mg by mouth daily. [provider]  Active Self  Azelastine HCl 137 MCG/SPRAY SOLN [id number]  As needed  Patient not taking: Reported on 09/11/2023   [provider]  Active   Blood Glucose Monitoring Suppl (ACCU-CHEK GUIDE) w/Device KIT [id number] Yes Use as instructed to check blood sugar. Trixie File, MD  Active   Cholecalciferol (VITAMIN D) 50 MCG (2000 UT) CAPS [id number] Yes Take 1 capsule by mouth daily. [provider]  Active   Continuous Glucose Sensor (FREESTYLE LIBRE 3 PLUS SENSOR) MISC [id number] Yes Change sensor every 15 days. Gretta Comer POUR, NP  Active   Continuous Glucose Sensor (FREESTYLE LIBRE 3 SENSOR) [place] [id number] Yes APPLY SENSOR EVERY 14 DAYS TO CONTINOUSLY MONITOR GLUCOSE Clark, Katherine K, NP  Active   ezetimibe  (ZETIA ) 10 MG tablet [id number] Yes Take 1 tablet (10 mg total) by mouth daily. Burnard Debby LABOR, MD  Active   fexofenadine (ALLEGRA) 180 MG tablet [id number] Yes Take 180 mg by  mouth daily. [provider]  Active   glucose blood (ACCU-CHEK GUIDE) test strip [id number] Yes USE TO CHECK SUGAR THREE TIMES DAILY AS DIRECTED Trixie File, MD  Active   insulin  glargine (LANTUS  SOLOSTAR) 100 UNIT/ML Solostar Pen [id number] Yes Inject 30 Units into the skin daily. for diabetes. Gretta Comer POUR, NP  Active   Insulin  Pen Needle (BD PEN NEEDLE NANO 2ND GEN) 32G X 4 MM MISC [id number] Yes USE 1 PEN NEEDLE WITH INSULIN  PEN once daily Clark, Katherine K, NP  Active   linagliptin  (TRADJENTA ) 5 MG TABS tablet [id number]  Take 1 tablet (5 mg total) by mouth daily. for diabetes.  Patient not taking: Reported on 09/11/2023   Clark, Katherine K, NP  Active   losartan  (COZAAR ) 100 MG tablet [id number] Yes Take 1 tablet (100 mg total) by mouth daily. Burnard Debby LABOR, MD  Active   metoprolol  succinate (TOPROL -XL) 50 MG 24 hr tablet [id number] Yes TAKE 1 TABLET BY MOUTH IN THE MORNING AND AT BEDTIME TAKE  WITH  OR  IMMEDIATELY  FOLLOWING  A  MEAL Burnard Debby LABOR, MD  Active   mometasone (NASONEX) 50 MCG/ACT nasal spray [id number]  Place 2 sprays into the nose daily.  Patient not taking: Reported on 09/11/2023   [provider]  Active  Multiple Vitamin (MULTIVITAMIN) capsule [id number] Yes Take 1 capsule by mouth daily. [provider]  Active Self  pantoprazole  (PROTONIX ) 40 MG tablet [id number] Yes Take 1 tablet by mouth once daily Burnard Debby LABOR, MD  Active   Polyethyl Glycol-Propyl Glycol (SYSTANE OP) [id number] Yes Apply 2 drops to eye 2 (two) times daily.  [provider]  Active Self  rosuvastatin  (CRESTOR ) 10 MG tablet [id number] Yes Take 1 tablet (10 mg total) by mouth daily. for cholesterol. Burnard Debby LABOR, MD  Active   sertraline  (ZOLOFT ) 25 MG tablet [id number]  Take 1 tablet (25 mg total) by mouth daily. for anxiety and depression.  Patient not taking: Reported on 09/11/2023   Clark, Katherine K, NP  Active   TRAVATAN Z 0.004 % SOLN ophthalmic  solution [id number] Yes Place 1 drop into both eyes at bedtime. [provider]  Active Self           Med Note FELIPE, ELEANOR KATHEE Repress Dec 05, 2014  2:09 PM)              Recommendation:   PCP Follow-up Discuss concerns with taking tradjenta  with provider,  reports confusion with dosing . CSW informed pharmacist to assist with clarification  Follow Up Plan:   Telephone follow up appointment date/time:  09/25/23     Lenn Mean, LCSW   [facility], [facility] Licensed Clinical Social Worker  Direct Dial: [phone]

## 2023-09-11 NOTE — Addendum Note (Signed)
 Addended by: Chele Cornell K on: 09/11/2023 01:24 PM   Modules accepted: Orders

## 2023-09-11 NOTE — Patient Instructions (Signed)
 Visit Information  Thank you for taking time to visit with me today. Please don't hesitate to contact me if I can be of assistance to you before our next scheduled appointment.  Your next care management appointment is by telephone on 09/25/23 at 10am   Please call the care guide team at [phone] if you need to cancel, schedule, or reschedule an appointment.   Please call the Suicide and Crisis Lifeline: 988 call the [place]  National Suicide Prevention Lifeline: (708) 793-1739 or TTY: (315) 324-6689 TTY (386)117-4193) to talk to a trained counselor call 1-800-273-TALK (toll free, 24 hour hotline) call 911 if you are experiencing a Mental Health or Behavioral Health Crisis or need someone to talk to. Alaa Eyerman, LCSW [place]  [facility], [facility] Licensed Clinical Social Worker  Direct Dial: [phone]

## 2023-09-11 NOTE — Telephone Encounter (Signed)
 Noted, referral placed.

## 2023-09-12 ENCOUNTER — Telehealth: Payer: Self-pay

## 2023-09-12 NOTE — Telephone Encounter (Signed)
 LVM for patients daughter Angeline to return call to office in regards to PAP paperwork. Need to know if anything has been dropped off yet or if it will be soon.

## 2023-09-13 ENCOUNTER — Other Ambulatory Visit: Payer: Self-pay | Admitting: Primary Care

## 2023-09-13 ENCOUNTER — Telehealth: Payer: Self-pay

## 2023-09-13 ENCOUNTER — Encounter: Payer: Self-pay | Admitting: Pharmacist

## 2023-09-13 DIAGNOSIS — E11319 Type 2 diabetes mellitus with unspecified diabetic retinopathy without macular edema: Secondary | ICD-10-CM

## 2023-09-13 DIAGNOSIS — F32A Depression, unspecified: Secondary | ICD-10-CM

## 2023-09-13 NOTE — Telephone Encounter (Signed)
 Copied from CRM [id number]. Topic: General - Other >> Sep 13, 2023 10:13 AM Rosina BIRCH wrote: Reason for CRM: patient daughter returning a call to [facility] and she stated she did not drop the paperwork off to the receptionist because her mother is private with her things. The daughter stated she did not think the nurse needed the paperwork. The daughter want to talk to the nurse about it and if she need to come by she will.  CB [phone]

## 2023-09-13 NOTE — Telephone Encounter (Signed)
 Called and spoke with regina, clarified that PPW patient was referring to was already addressed by Mallie. Nothing further needed at this time.

## 2023-09-13 NOTE — Progress Notes (Signed)
 Brief Telephone Documentation Reason for Call: Patient left message w social work team regarding question for pharmacist   Summary of Call: Attempted to call patient to address her questions on new medication, Tradjenta .  Left voicemail.  Patient has my direct callback number for questions as needed.

## 2023-09-17 ENCOUNTER — Telehealth: Payer: Self-pay

## 2023-09-17 NOTE — Telephone Encounter (Signed)
 Patient was identified as falling into the [place] Measure - Diabetes.   Patient was: Appointment already scheduled for:  09/20/23 with pcp and 09/18/23 with pharmacy . Referred to pharmacy for chronic disease management.

## 2023-09-18 ENCOUNTER — Other Ambulatory Visit: Admitting: Pharmacist

## 2023-09-18 ENCOUNTER — Other Ambulatory Visit: Admitting: *Deleted

## 2023-09-18 DIAGNOSIS — Z794 Long term (current) use of insulin: Secondary | ICD-10-CM

## 2023-09-18 DIAGNOSIS — E11319 Type 2 diabetes mellitus with unspecified diabetic retinopathy without macular edema: Secondary | ICD-10-CM

## 2023-09-18 NOTE — Patient Instructions (Signed)
 Visit Information  Thank you for taking time to visit with me today. Please don't hesitate to contact me if I can be of assistance to you before our next scheduled appointment.  Your next care management appointment is by telephone on 09/25/23 at 10am    Please call the care guide team at [phone] if you need to cancel, schedule, or reschedule an appointment.   Please call the Suicide and Crisis Lifeline: 988 call the [place]  National Suicide Prevention Lifeline: (818) 629-0798 or TTY: 262-526-2367 TTY 913 317 9089) to talk to a trained counselor call 1-800-273-TALK (toll free, 24 hour hotline) if you are experiencing a Mental Health or Behavioral Health Crisis or need someone to talk to.  Kahliya Fraleigh, LCSW [place]  [facility], [facility] Licensed Clinical Social Worker  Direct Dial: [phone]

## 2023-09-18 NOTE — Patient Outreach (Signed)
 Complex Care Management   Visit Note  09/18/2023  Name:  Laura Ferguson MRN: [id number] DOB: 12-Sep-1932  Situation: Referral received for Complex Care Management related to Mental/Behavioral Health diagnosis , depression-Grief and Loss. Patient discussed feeling overwhelmed with medical appointments and medciation changes. I obtained verbal consent from Patient. Visit completed with patient on the phone.   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   LIMA to LAD, SVG to ramus intermedius, SVG to OM1 & OM2, SVG to acute marginal, SVG to distal RCA   Type 2 diabetes mellitus (HCC)     Assessment:  Patient Reported Symptoms:  Cognitive Cognitive Status: Normal speech and language skills, Poor judgment in daily scenarios, Alert and oriented to person, place, and time Cognitive/Intellectual Conditions Management [RPT]: None reported or documented in medical history or problem list   Health Maintenance Behaviors: Annual physical exam, Spiritual practice(s) Healing Pattern: Average Health Facilitated by: Prayer/meditation  Neurological Neurological Review of Symptoms: Dizziness Neurological Management Strategies: Routine screening Neurological Self-Management Outcome: 4 (good) Neurological Comment: reports continued plan to to stop and steady self  HEENT HEENT Symptoms Reported: No symptoms reported      Cardiovascular Cardiovascular Symptoms Reported: No symptoms reported    Respiratory Respiratory Symptoms Reported: No symptoms reported    Endocrine Endocrine Symptoms Reported: No symptoms reported Is patient  diabetic?: Yes Is patient checking blood sugars at home?: Yes List most recent blood sugar readings, include date and time of day: patient states that she is very upset today regarding her medications-09/17/25 4:28 250 Endocrine Comment: continues to report concerns regarding the Tradjenta , states that she has not started taking it yet-will take questions regarding the medicine  Gastrointestinal Gastrointestinal Symptoms Reported: Not assessed      Genitourinary Genitourinary Symptoms Reported: No symptoms reported    Integumentary Integumentary Symptoms Reported: No symptoms reported    Musculoskeletal Musculoskelatal Symptoms Reviewed: No symptoms reported        Psychosocial Psychosocial Symptoms Reported: Depression - if selected complete PHQ 2-9, Anxiety - if selected complete GAD Additional Psychological Details: Patient discussed feeling overwhelmed with medication changes and medical appointments Behavioral Management Strategies: Coping strategies Major Change/Loss/Stressor/Fears (CP): Medical condition, self Behaviors When Feeling Stressed/Fearful: patient plans to discuss her concerns with her provider on 09/20/23 Techniques to Cope with Loss/Stress/Change: Diversional activities Quality of Family Relationships: helpful, involved, supportive    09/18/2023    PHQ2-9 Depression Screening   Little interest or pleasure in doing things    Feeling down, depressed, or hopeless    PHQ-2 - Total Score    Trouble falling or staying asleep, or sleeping too much    Feeling tired or having little energy    Poor appetite or overeating     Feeling bad about yourself - or that you are a failure or have let yourself or your family down    Trouble concentrating on things, such as reading the newspaper or watching television    Moving or speaking so slowly that other people could have noticed.  Or the opposite - being so fidgety or  restless that you have been moving around a lot more than usual     Thoughts that you would be better off dead, or hurting yourself in some way    PHQ2-9 Total Score    If you checked off any problems, how difficult have these problems made it for you to do your work, take care of things at home, or get along with other people    Depression Interventions/Treatment      There were no vitals filed for this visit.  Medications Reviewed Today     Reviewed by Ermalinda Lenn HERO, LCSW (Social Worker) on 09/18/23 at 1625  Med List Status: <None>   Medication Order Taking? Sig Documenting Provider Last Dose Status Informant  Acetaminophen (TYLENOL 8 HOUR PO) [id number]  Take 1 tablet by mouth every 8 (eight) hours as needed.  [provider]  Active Self  aspirin 81 MG tablet [id number]  Take 81 mg by mouth daily. [provider]  Active Self  Azelastine HCl 137 MCG/SPRAY SOLN [id number]  As needed  Patient not taking: Reported on 09/11/2023   [provider]  Active   Blood Glucose Monitoring Suppl (ACCU-CHEK GUIDE) w/Device KIT [id number]  Use as instructed to check blood sugar. Trixie File, MD  Active   Cholecalciferol (VITAMIN D) 50 MCG (2000 UT) CAPS [id number]  Take 1 capsule by mouth daily. [provider]  Active   Continuous Glucose Sensor (FREESTYLE LIBRE 3 PLUS SENSOR) MISC [id number]  Change sensor every 15 days. Gretta Comer POUR, NP  Active   Continuous Glucose Sensor (FREESTYLE LIBRE 3 SENSOR) [place] [id number]  APPLY SENSOR EVERY 14 DAYS TO CONTINOUSLY MONITOR GLUCOSE Gretta Comer POUR, NP  Active   ezetimibe  (ZETIA ) 10 MG tablet [id number]  Take 1 tablet (10 mg total) by mouth daily. Burnard Debby LABOR, MD  Active   fexofenadine [facility]) 180 MG tablet [id number]  Take 180 mg by mouth daily. [provider]  Active   glucose blood (ACCU-CHEK GUIDE) test strip [id number]  USE TO CHECK SUGAR THREE TIMES DAILY AS DIRECTED Trixie File, MD  Active   Insulin  Pen Needle (BD PEN NEEDLE NANO 2ND GEN) 32G X 4 MM MISC  [id number]  USE 1 PEN NEEDLE WITH INSULIN  PEN once daily Clark, Katherine K, NP  Active   LANTUS  SOLOSTAR 100 UNIT/ML Solostar Pen [id number]  INJECT 30 UNITS SUBCUTANEOUSLY ONCE DAILY FOR DIABETES Clark, Katherine K, NP  Active   linagliptin  (TRADJENTA ) 5 MG TABS tablet [id number]  Take 1 tablet (5 mg total) by mouth daily. for diabetes.  Patient not taking: Reported on 09/11/2023   Clark, Katherine K, NP  Active   losartan  (COZAAR ) 100 MG tablet [id number]  Take 1 tablet (100 mg total) by mouth daily. Burnard Debby LABOR, MD  Active   metoprolol  succinate (TOPROL -XL) 50 MG 24 hr tablet [id number]  TAKE 1 TABLET BY MOUTH IN THE MORNING AND AT BEDTIME TAKE  WITH  OR  IMMEDIATELY  FOLLOWING  A  MEAL Burnard Debby LABOR, MD  Active   mometasone (NASONEX) 50 MCG/ACT nasal spray [id number]  Place 2 sprays into the nose daily.  Patient not taking: Reported on 09/11/2023   [provider]  Active   Multiple Vitamin (MULTIVITAMIN) capsule [id number]  Take 1 capsule by mouth daily. [provider]  Active Self  pantoprazole  (PROTONIX ) 40 MG tablet [id number]  Take 1 tablet by mouth once daily Burnard Debby LABOR, MD  Active   Polyethyl Glycol-Propyl Glycol (SYSTANE OP) [id number]  Apply 2 drops to eye 2 (two) times daily.  [provider]  Active Self  rosuvastatin  (CRESTOR ) 10 MG tablet [id number]  Take 1 tablet (10 mg total) by mouth daily. for cholesterol. Burnard Debby LABOR, MD  Active   sertraline  (ZOLOFT ) 25 MG tablet [id number]  Take 1 tablet (25 mg total) by mouth daily. for anxiety and depression.  Patient not taking: Reported on 09/11/2023   Clark, Katherine K, NP  Active   TRAVATAN Z 0.004 % SOLN ophthalmic solution [id number]  Place 1 drop into both eyes at bedtime. [provider]  Active Self           Med Note FELIPE, ELEANOR KATHEE Repress Dec 05, 2014  2:09 PM)              Recommendation:   PCP Follow-up  Follow Up Plan:   Telephone follow up appointment date/time:  09/25/23  10am  Eulia Hatcher, LCSW [place]  [facility], [facility] Licensed Clinical Social Worker  Direct Dial: [phone]

## 2023-09-18 NOTE — Progress Notes (Addendum)
 Brief Telephone Documentation Reason for Call: Patient left message regarding question for pharmacist  Summary of Call: Called patient to follow up on questions regarding previously-prescribed diabetes medication.   Taking 30 units of insulin  daily.  Has not started Tradjenta  yet at this time. No specific reasoning. Relates this to her depression. Motivation has been lower. Feels she is on a lot of medications.  Does plan on stating this medication tomorrow. Continues to note high FBG on Libre.   Some stomach upset recently with unclear cause. No vomiting. Has been drinking plenty of water.  Patient confirms she is planning on coming to PCP appointment on Friday.   Follow Up: Patient given direct line for further questions/concerns.  Laura Ferguson, PharmD Clinical Pharmacist [facility] [phone]

## 2023-09-20 ENCOUNTER — Ambulatory Visit: Admitting: Primary Care

## 2023-09-20 ENCOUNTER — Encounter: Payer: Self-pay | Admitting: Primary Care

## 2023-09-20 VITALS — BP 142/80 | HR 121 | Temp 98.2°F | Ht 63.0 in | Wt 116.0 lb

## 2023-09-20 DIAGNOSIS — F419 Anxiety disorder, unspecified: Secondary | ICD-10-CM

## 2023-09-20 DIAGNOSIS — Z794 Long term (current) use of insulin: Secondary | ICD-10-CM

## 2023-09-20 DIAGNOSIS — E11319 Type 2 diabetes mellitus with unspecified diabetic retinopathy without macular edema: Secondary | ICD-10-CM | POA: Diagnosis not present

## 2023-09-20 DIAGNOSIS — Z659 Problem related to unspecified psychosocial circumstances: Secondary | ICD-10-CM | POA: Diagnosis not present

## 2023-09-20 DIAGNOSIS — F32A Depression, unspecified: Secondary | ICD-10-CM | POA: Diagnosis not present

## 2023-09-20 NOTE — Assessment & Plan Note (Signed)
 Remains uncontrolled.  Strongly advised she start the sertraline  (Zoloft ) medication that was prescribed in January 2025. She agrees.  New prescription sent to pharmacy.  Close follow-up next month.

## 2023-09-20 NOTE — Assessment & Plan Note (Addendum)
 Uncontrolled with A1C of 9.4 in July 2025.  Remains uncontrolled based on CGM review. We discussed in great detail her prescribed diabetes regimen.   Continue Lantus  30 units. Start Tradjenta  5 mg daily  Close follow up with pharmacy call for next week.  Follow up next month with us .

## 2023-09-20 NOTE — Assessment & Plan Note (Signed)
 Uncontrolled.  We went over her upcoming list of appointments which include mostly phone visits but to in person visits. Appointment scheduled printed and handed to patient.  Start sertraline  25 mg daily.  We will also call her early next week to do a complete medication reconciliation as both she and her daughter are uncertain regarding which medication she is actually taking.

## 2023-09-20 NOTE — Patient Instructions (Signed)
 Start taking linagliptin  (Tradjenta ) 5 mg once daily for diabetes.  Continue Lantus  30 units daily for diabetes.  Start sertraline  (Zoloft ) 25 mg once daily for depression/anxiety.  We will see you as scheduled next month!

## 2023-09-20 NOTE — Progress Notes (Signed)
 Subjective:    Patient ID: Laura Ferguson, female    DOB: 09-17-1932, 89 y.o.   MRN: [id number]  AIRABELLA Ferguson is a very pleasant 89 y.o. female with a history of CAD, hypertension, uncontrolled type 2 diabetes, GERD, anxiety depression who presents today to discuss medications.  Her daughter joins us  today who assists with HPI.  She is followed by social work for complex care Insurance account manager and by pharmacy for uncontrolled diabetes.  During her most recent call with social work she expressed feeling overwhelmed with medical appointments and medication changes so she was asked to schedule appoint with PCP.  Historically and currently she is a poor historian regarding her medications and overall health care plan, particularly pertaining to diabetes.  She typically comes alone to appointments even though it has been requested she bring family along.  Evaluated by pharmacy on 08/21/2023 for follow-up of uncontrolled type 2 diabetes.  During this visit her Lantus  30 units was continued and Tradjenta  5 mg once daily was initiated.  The patient called several times over the last month with questions regarding her regimen including statements such as feeling overwhelmed.  Pharmacy contacted patient on 09/18/2023 revealing diabetes medication plan.  During this phone call she admitted to not starting Tradjenta  as recommended.  She is taking 2/3 of her prescribed medications. She never started taking Tradjenta . She is injecting Lantus  30 units in the AM. She is on CGM and is seeing readings ranging from 76-226. She claims that she was never told why she needs to take Tradjenta . She's very overwhelmed with multiple appointments. She remains stressed. She does not take Zoloft , never has.  She does not have a prescription at home.  She continues to experience dizziness. Evaluated by ENT and a referral was placed for neurology.     Review of Systems  Constitutional:  Positive for fatigue.  Respiratory:  Negative for  shortness of breath.   Cardiovascular:  Negative for chest pain.  Neurological:  Positive for dizziness.  Psychiatric/Behavioral:  The patient is nervous/anxious.          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  Substance and Sexual Activity   Alcohol use: No    Alcohol/week: 0.0 standard drinks of alcohol   Drug use: No   Sexual activity: Not on file  Other Topics Concern   Not on file  Social History Narrative   Married.   Retired.   Works as a  Caregiver.    Enjoys helping other.    Social Drivers of Corporate investment banker Strain: Low Risk  (01/22/2023)   Overall Financial Resource Strain (CARDIA)    Difficulty of Paying Living Expenses: Not hard at all  Food Insecurity: No Food Insecurity (08/22/2023)   Hunger Vital Sign    Worried About Running Out of Food in the Last Year: Never true    Ran Out of Food in the Last Year: Never true  Transportation Needs: No Transportation Needs (08/22/2023)   PRAPARE - Administrator, Civil Service (Medical): No    Lack of Transportation (Non-Medical): No  Physical  Activity: Inactive (01/22/2023)   Exercise Vital Sign    Days of Exercise per Week: 0 days    Minutes of Exercise per Session: 0 min  Stress: No Stress Concern Present (01/22/2023)   [employer] of Occupational Health - Occupational Stress Questionnaire    Feeling of Stress : Only a little  Social Connections: Moderately Isolated (01/22/2023)   Social Connection and Isolation Panel    Frequency of Communication with Friends and Family: More than three times a week    Frequency of Social Gatherings with Friends and Family: Once a week    Attends Religious Services: More than 4 times per year    Active Member of [employer] or Organizations: No    Attends Banker Meetings: Never    Marital Status: Widowed  Intimate Partner Violence: Not At Risk (08/22/2023)   Humiliation, Afraid, Rape, and Kick questionnaire    Fear of Current or Ex-Partner: No    Emotionally Abused: No    Physically Abused: No    Sexually Abused: No    Past Surgical History:  Procedure Laterality Date   CARDIAC CATHETERIZATION  05/07/2007   EF 55%, focal mild hypocontractility in mid-distal inferior wall & mid posterolateral wall; severe multivessel CAD - susequent CABGx6 (Dr. IVAR Sor)   CORONARY ARTERY BYPASS GRAFT  05/09/2007   LIMA to LAD, veing to intermediate; SVG to OM1 & OM2; SVG to acute marginal & distal RCA (Dr. Kerrin)   TRANSTHORACIC ECHOCARDIOGRAM  02/13/2010   EF =>55%, vigorous contraction EF 65%; LA mild-mod dilated; IV normal diameter - normal CVP; trace MR; mild TR; trace AV regurg    Family History  Problem Relation Age of Onset   Cancer Mother    Diabetes Sister    Cancer Child     No Known Allergies  Current Outpatient Medications on File Prior to Visit  Medication Sig Dispense Refill   Acetaminophen (TYLENOL 8 HOUR PO) Take 1 tablet by mouth every 8 (eight) hours as needed.      aspirin 81 MG tablet Take 81 mg by mouth daily.     Azelastine HCl 137 MCG/SPRAY SOLN As needed      Blood Glucose Monitoring Suppl (ACCU-CHEK GUIDE) w/Device KIT Use as instructed to check blood sugar. 1 kit 0   Cholecalciferol (VITAMIN D) 50 MCG (2000 UT) CAPS Take 1 capsule by mouth daily.     Continuous Glucose Sensor (FREESTYLE LIBRE 3 PLUS SENSOR) MISC Change sensor every 15 days. 6 each 3   Continuous Glucose Sensor (FREESTYLE LIBRE 3 SENSOR) MISC APPLY SENSOR EVERY 14 DAYS TO CONTINOUSLY MONITOR GLUCOSE 6 each 1   ezetimibe  (ZETIA ) 10 MG tablet Take 1 tablet (10 mg total) by mouth daily. 90 tablet 2   fexofenadine (ALLEGRA) 180 MG tablet Take 180 mg by mouth daily.  glucose blood (ACCU-CHEK GUIDE) test strip USE TO CHECK SUGAR THREE TIMES DAILY AS DIRECTED 250 each 3   Insulin  Pen Needle (BD PEN NEEDLE NANO 2ND GEN) 32G X 4 MM MISC USE 1 PEN NEEDLE WITH INSULIN  PEN once daily 100 each 3   LANTUS  SOLOSTAR 100 UNIT/ML Solostar Pen INJECT 30 UNITS SUBCUTANEOUSLY ONCE DAILY FOR DIABETES 30 mL 0   losartan  (COZAAR ) 100 MG tablet Take 1 tablet (100 mg total) by mouth daily. 90 tablet 1   metoprolol  succinate (TOPROL -XL) 50 MG 24 hr tablet TAKE 1 TABLET BY MOUTH IN THE MORNING AND AT BEDTIME TAKE  WITH  OR  IMMEDIATELY  FOLLOWING  A  MEAL 180 tablet 3   mometasone (NASONEX) 50 MCG/ACT nasal spray Place 2 sprays into the nose daily.     Multiple Vitamin (MULTIVITAMIN) capsule Take 1 capsule by mouth daily.     pantoprazole  (PROTONIX ) 40 MG tablet Take 1 tablet by mouth once daily 90 tablet 3   Polyethyl Glycol-Propyl Glycol (SYSTANE OP) Apply 2 drops to eye 2 (two) times daily.      rosuvastatin  (CRESTOR ) 10 MG tablet Take 1 tablet (10 mg total) by mouth daily. for cholesterol. 90 tablet 1   sertraline  (ZOLOFT ) 25 MG tablet Take 1 tablet (25 mg total) by mouth daily. for anxiety and depression. 90 tablet 0   TRAVATAN Z 0.004 % SOLN ophthalmic solution Place 1 drop into both eyes at bedtime.     linagliptin  (TRADJENTA ) 5 MG TABS tablet Take 1 tablet (5 mg total) by mouth daily. for diabetes.  (Patient not taking: Reported on 09/20/2023) 90 tablet 0   No current facility-administered medications on file prior to visit.    BP (!) 142/80   Pulse (!) 121   Temp 98.2 F (36.8 C) (Temporal)   Ht 5' 3 (1.6 m)   Wt 116 lb (52.6 kg)   SpO2 99%   BMI 20.55 kg/m  Objective:   Physical Exam Cardiovascular:     Rate and Rhythm: Normal rate and regular rhythm.  Pulmonary:     Effort: Pulmonary effort is normal.     Breath sounds: Normal breath sounds.  Musculoskeletal:     Cervical back: Neck supple.  Skin:    General: Skin is warm and dry.  Neurological:     Mental Status: She is alert and oriented to person, place, and time.  Psychiatric:        Mood and Affect: Mood normal.     Physical Exam        Assessment & Plan:  Type 2 diabetes mellitus with retinopathy, with long-term current use of insulin , macular edema presence unspecified, unspecified laterality, unspecified retinopathy severity (HCC) Assessment & Plan: Uncontrolled with A1C of 9.4 in July 2025.  Remains uncontrolled based on CGM review. We discussed in great detail her prescribed diabetes regimen.   Continue Lantus  30 units. Start Tradjenta  5 mg daily  Close follow up with pharmacy call for next week.  Follow up next month with us .   Anxiety and depression Assessment & Plan: Remains uncontrolled.  Strongly advised she start the sertraline  (Zoloft ) medication that was prescribed in January 2025. She agrees.  New prescription sent to pharmacy.  Close follow-up next month.   Other social stressor Assessment & Plan: Uncontrolled.  We went over her upcoming list of appointments which include mostly phone visits but to in person visits. Appointment scheduled printed and handed to patient.  Start sertraline  25 mg daily.  We  will also call her early next week to do a complete medication reconciliation as both she and her daughter are uncertain regarding which medication she is actually  taking.     Assessment and Plan Assessment & Plan       Comer MARLA Gaskins, NP      History of Present Illness

## 2023-09-23 ENCOUNTER — Encounter: Payer: Self-pay | Admitting: Podiatry

## 2023-09-23 ENCOUNTER — Ambulatory Visit (INDEPENDENT_AMBULATORY_CARE_PROVIDER_SITE_OTHER): Admitting: Podiatry

## 2023-09-23 ENCOUNTER — Telehealth: Payer: Self-pay

## 2023-09-23 DIAGNOSIS — M2042 Other hammer toe(s) (acquired), left foot: Secondary | ICD-10-CM

## 2023-09-23 DIAGNOSIS — Z794 Long term (current) use of insulin: Secondary | ICD-10-CM | POA: Diagnosis not present

## 2023-09-23 DIAGNOSIS — M79675 Pain in left toe(s): Secondary | ICD-10-CM | POA: Diagnosis not present

## 2023-09-23 DIAGNOSIS — B351 Tinea unguium: Secondary | ICD-10-CM

## 2023-09-23 DIAGNOSIS — M79674 Pain in right toe(s): Secondary | ICD-10-CM

## 2023-09-23 DIAGNOSIS — L84 Corns and callosities: Secondary | ICD-10-CM

## 2023-09-23 DIAGNOSIS — E11319 Type 2 diabetes mellitus with unspecified diabetic retinopathy without macular edema: Secondary | ICD-10-CM

## 2023-09-23 DIAGNOSIS — M2041 Other hammer toe(s) (acquired), right foot: Secondary | ICD-10-CM

## 2023-09-23 DIAGNOSIS — B353 Tinea pedis: Secondary | ICD-10-CM | POA: Diagnosis not present

## 2023-09-23 DIAGNOSIS — M2011 Hallux valgus (acquired), right foot: Secondary | ICD-10-CM | POA: Diagnosis not present

## 2023-09-23 DIAGNOSIS — M2012 Hallux valgus (acquired), left foot: Secondary | ICD-10-CM

## 2023-09-23 MED ORDER — CLOTRIMAZOLE-BETAMETHASONE 1-0.05 % EX CREA: TOPICAL_CREAM | CUTANEOUS | 1 refills | Status: DC

## 2023-09-23 NOTE — Telephone Encounter (Signed)
 Copied from CRM #[id number]. Topic: General - Other >> Sep 23, 2023  3:53 PM Rosina BIRCH wrote: Reason for CRM: patient called stating she want the doctor to call [facility] to give them some information regarding a referral MD [phone]

## 2023-09-23 NOTE — Telephone Encounter (Signed)
 Unable to reach patient. Left voicemail to return call to our office.

## 2023-09-23 NOTE — Progress Notes (Signed)
 Subjective:  Patient ID: Laura Ferguson, female    DOB: 1932-05-06,  MRN: [id number]  89 y.o. female presents to clinic with  preventative diabetic foot care for painful elongated mycotic toenails 1-5 bilaterally which are tender when wearing enclosed shoe gear. Pain is relieved with periodic professional debridement. No chief complaint on file.  New problem(s): None   PCP is Gretta Comer POUR, NP. ARNETTA 09/20/2023.  No Known Allergies  Review of Systems: Negative except as noted in the HPI.   Objective:  Laura Ferguson is a pleasant 89 y.o. female WD, WN in NAD. AAO x 3.  Vascular Examination: Vascular status intact b/l with palpable pedal pulses LLE; faintly palpable pulses RLE. Pedal hair absent. CFT immediate b/l. No edema. No pain with calf compression b/l. Skin temperature gradient WNL b/l.   Neurological Examination: Sensation grossly intact b/l with 10 gram monofilament. Vibratory sensation intact b/l.   Dermatological Examination: Pedal skin with normal turgor, texture and tone b/l. Toenails 1-5 b/l thick, discolored, elongated with subungual debris and pain on dorsal palpation. Hyperkeratotic lesion submet head 1 left foot. Preulcerative lesion noted submet head 1 right foot. There is visible subdermal hemorrhage. There is no surrounding erythema, no edema, no drainage, no odor, no fluctuance.  Musculoskeletal Examination: Muscle strength 5/5 to b/l LE. HAV with bunion deformity noted b/l LE. Hammertoe(s) 2-5 b/l.  Radiographs: None  Last A1c:      Latest Ref Rng & Units 08/06/2023    2:14 PM 05/09/2023    9:09 AM 02/08/2023   11:11 AM  Hemoglobin A1C  Hemoglobin-A1c 4.0 - 5.6 % 9.4  9.4  8.3      Assessment:   1. Pain due to onychomycosis of toenails of both feet   2. Tinea pedis of both feet   3. Pre-ulcerative calluses   4. Hallux valgus, acquired, bilateral   5. Acquired hammertoes of both feet   6. Type 2 diabetes mellitus with retinopathy, with long-term  current use of insulin , macular edema presence unspecified, unspecified laterality, unspecified retinopathy severity (HCC)    Plan:   Meds ordered this encounter  Medications   clotrimazole -betamethasone  (LOTRISONE ) cream    Sig: Apply to both feet and between toes bid x 6 weeks.    Dispense:  45 g    Refill:  1   Orders Placed This Encounter  Procedures   For home use only DME Other see comment    To [facility]: Dispense one pair extra depth shoes and 3 pair custom insoles.  Offload submet head 1 bilaterally.    Length of Need:   12 Months    -Patient was evaluated today. All questions/concerns addressed on today's visit. -Continue foot and shoe inspections daily. Monitor blood glucose per PCP/Endocrinologist's recommendations. -Rx to be sent to [facility] for one pair diabetic shoes and 3 pair custom insoles. To [facility] [address] [place], [place] [address] Phone: [phone] Fax: [phone]. -Toenails 1-5 b/l were debrided in length and girth with sterile nail nippers and dremel without iatrogenic bleeding.  -Hyperkeratotic lesion(s) submet head 1 left foot debrided with dremel bur without incident. Total number debrided=1. -Preulcerative lesion pared submet head 1 right foot utilizing sterile scalpel blade. Total number pared=1. -Rx refilled for Lotrisone  Cream 1%/0.05% to be applied to affected foot/feet twice daily for 6 weeks. -Patient/POA to call should there be question/concern in the interim.  Return in about 3 months (around 12/23/2023).  Delon LITTIE Merlin, DPM      [place] LOCATION: [address], [place] [address]                   Office [phone]   [facility] LOCATION: [address] [place], [place] [address] Office [phone]

## 2023-09-23 NOTE — Telephone Encounter (Signed)
 Patient called back in, spoke with her she states [facility] needs the referral for neurology.   Please check status of neurology referral placed on 09/11/23

## 2023-09-23 NOTE — Patient Instructions (Signed)
 To prevent reinfection, spray shoes with lysol every evening.  Clean tub or shower with bleach based cleanser.  Athlete's Foot Athlete's foot (tinea pedis) is a fungal infection of the skin on your feet. It often occurs on the skin that is between or underneath the toes. It can also occur on the soles of your feet. The infection can spread from person to person (is contagious). It can also spread when a person's bare feet come in contact with the fungus on shower floors or on items such as shoes. What are the causes? This condition is caused by a fungus that grows in warm, moist places. You can get athlete's foot by sharing shoes, shower stalls, towels, and wet floors with someone who is infected. Not washing your feet or changing your socks often enough can also lead to athlete's foot. What increases the risk? This condition is more likely to develop in: Men. People who have a weak body defense system (immune system). People who have diabetes. People who use public showers, such as at a gym. People who wear heavy-duty shoes, such as industrial or military shoes. Seasons with warm, humid weather. What are the signs or symptoms? Symptoms of this condition include: Itchy areas between your toes or on the soles of your feet. White, flaky, or scaly areas between your toes or on the soles of your feet. Very itchy small blisters between your toes or on the soles of your feet. Small cuts in your skin. These cuts can become infected. Thick or discolored toenails. How is this diagnosed? This condition may be diagnosed with a physical exam and a review of your medical history. Your health care provider may also take a skin or toenail sample to examine under a microscope. How is this treated? This condition is treated with antifungal medicines. These may be applied as powders, ointments, or creams. In severe cases, an oral antifungal medicine may be given. Follow these instructions at  home: Medicines Apply or take over-the-counter and prescription medicines only as told by your health care provider. Apply your antifungal medicine as told by your health care provider. Do not stop using the antifungal even if your condition improves. Foot care Do not scratch your feet. Keep your feet dry: Wear cotton or wool socks. Change your socks every day or if they become wet. Wear shoes that allow air to flow, such as sandals or canvas tennis shoes. Wash and dry your feet, including the area between your toes. Also, wash and dry your feet: Every day or as told by your health care provider. After exercising. General instructions Do not let others use towels, shoes, nail clippers, or other personal items that touch your feet. Protect your feet by wearing sandals in wet areas, such as locker rooms and shared showers. Keep all follow-up visits. This is important. If you have diabetes, keep your blood sugar under control. Contact a health care provider if: You have a fever. You have swelling, soreness, warmth, or redness in your foot. Your feet are not getting better with treatment. Your symptoms get worse. You have new symptoms. You have severe pain. Summary Athlete's foot (tinea pedis) is a fungal infection of the skin on your feet. It often occurs on skin that is between or underneath the toes. This condition is caused by a fungus that grows in warm, moist places. Symptoms include white, flaky, or scaly areas between your toes or on the soles of your feet. This condition is treated with antifungal medicines.   Keep your feet clean. Always dry them thoroughly. This information is not intended to replace advice given to you by your health care provider. Make sure you discuss any questions you have with your health care provider. Document Revised: 04/24/2020 Document Reviewed: 04/24/2020 Elsevier Patient Education  2024 Elsevier Inc.  

## 2023-09-24 ENCOUNTER — Encounter: Payer: Self-pay | Admitting: *Deleted

## 2023-09-24 NOTE — Telephone Encounter (Signed)
 Referral has been faxed to [facility].   MyChart Letter and Message sent to the patient with referral information and scheduling instructions.

## 2023-09-24 NOTE — Telephone Encounter (Signed)
 LVM per dpr advising patient referral has been sent.

## 2023-09-25 ENCOUNTER — Other Ambulatory Visit: Payer: Self-pay | Admitting: *Deleted

## 2023-09-25 NOTE — Patient Outreach (Signed)
 Complex Care Management   Visit Note  09/25/2023  Name:  Laura Ferguson MRN: [id number] DOB: [birth year]  Situation: Referral received for Complex Care Management related to Mental/Behavioral Health diagnosis Depression, grief and loss I obtained verbal consent from Patient.  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   LIMA to LAD, SVG to ramus intermedius, SVG to OM1 & OM2, SVG to acute marginal, SVG to distal RCA   Type 2 diabetes mellitus (HCC)     Assessment: Patient Reported Symptoms:  Cognitive Cognitive Status: Normal speech and language skills, Poor judgment in daily scenarios, Alert and oriented to person, place, and time Cognitive/Intellectual Conditions Management [RPT]: None reported or documented in medical history or problem list   Health Maintenance Behaviors: Annual physical exam Healing Pattern: Average Health Facilitated by: Prayer/meditation  Neurological Neurological Review of Symptoms: No symptoms reported Neurological Management Strategies: Medication therapy Neurological Comment: dizziness has improved,  HEENT HEENT Symptoms Reported: No symptoms reported      Cardiovascular Cardiovascular Symptoms Reported: No symptoms reported    Respiratory Respiratory Symptoms Reported: No symptoms reported    Endocrine Endocrine Symptoms Reported: No symptoms reported Is patient diabetic?: Yes Is patient checking blood sugars at home?: Yes List most recent blood sugar readings, include date and time of day: 73 09/25/23 6am now it is 102 09/25/23  10:27am Endocrine Comment: reports taking her medication as prescribed, she is also in need of diabetic shoes-has prescription  Gastrointestinal Gastrointestinal Symptoms Reported: Abdominal pain or discomfort, Diarrhea Other Gastrointestinal Symptoms: resolved now-upset stomach when on vacation at the beach-experienced some diarrhea Gastrointestinal Management Strategies: Medication therapy    Genitourinary Genitourinary Symptoms Reported: No symptoms reported    Integumentary Integumentary Symptoms Reported: No symptoms reported    Musculoskeletal Musculoskelatal Symptoms Reviewed: No symptoms reported Additional Musculoskeletal Details: patient states that she does not get as dizzy anymore, more steady on her feet now Musculoskeletal Management Strategies: Medication therapy      Psychosocial Psychosocial Symptoms Reported: No symptoms reported Additional Psychological Details: depression has improved-having better days-sleeping better and taing her medication as prescribed-listening to gospel music -refuses to attend the senior cetner activities-declines anything that may bring me down   Behaviors When Feeling Stressed/Fearful: listens to [employer], plants flowers, visits with friends at the nursing home      09/25/2023    PHQ2-9 Depression Screening   Little interest or pleasure in doing things    Feeling down, depressed, or hopeless    PHQ-2 - Total Score    Trouble falling or staying asleep, or sleeping too much    Feeling tired or having little energy    Poor appetite or overeating     Feeling bad about yourself - or that you are a failure or have let yourself or your family down    Trouble concentrating on things, such as reading the newspaper or watching television    Moving or speaking so slowly that other people could have noticed.  Or the opposite - being so fidgety or  restless that you have been moving around a lot more than usual    Thoughts that you would be  better off dead, or hurting yourself in some way    PHQ2-9 Total Score    If you checked off any problems, how difficult have these problems made it for you to do your work, take care of things at home, or get along with other people    Depression Interventions/Treatment      There were no vitals filed for this visit.  Medications Reviewed Today     Reviewed by Ermalinda Lenn HERO, LCSW (Social Worker) on 09/25/23 at 1014  Med List Status: <None>   Medication Order Taking? Sig Documenting Provider Last Dose Status Informant  Acetaminophen (TYLENOL 8 HOUR PO) [id number]  Take 1 tablet by mouth every 8 (eight) hours as needed.  [provider]  Active Self  aspirin 81 MG tablet [id number]  Take 81 mg by mouth daily. [provider]  Active Self  Azelastine HCl 137 MCG/SPRAY SOLN [id number]  As needed [provider]  Active   Blood Glucose Monitoring Suppl (ACCU-CHEK GUIDE) w/Device KIT [id number]  Use as instructed to check blood sugar. Trixie File, MD  Active   Cholecalciferol (VITAMIN D) 50 MCG (2000 UT) CAPS [id number]  Take 1 capsule by mouth daily. [provider]  Active   clotrimazole -betamethasone  (LOTRISONE ) cream [id number]  Apply to both feet and between toes bid x 6 weeks. Gaynel Delon CROME, DPM  Active   Continuous Glucose Sensor (FREESTYLE LIBRE 3 PLUS SENSOR) MISC [id number]  Change sensor every 15 days. Gretta Comer POUR, NP  Active   Continuous Glucose Sensor (FREESTYLE LIBRE 3 SENSOR) [place] [id number]  APPLY SENSOR EVERY 14 DAYS TO CONTINOUSLY MONITOR GLUCOSE Gretta Comer POUR, NP  Active   ezetimibe  (ZETIA ) 10 MG tablet [id number]  Take 1 tablet (10 mg total) by mouth daily. Burnard Debby LABOR, MD  Active   fexofenadine [facility]) 180 MG tablet [id number]  Take 180 mg by mouth daily. [provider]  Active   glucose blood (ACCU-CHEK GUIDE) test strip [id number]  USE TO CHECK SUGAR THREE TIMES DAILY AS DIRECTED Trixie File, MD  Active    Insulin  Pen Needle (BD PEN NEEDLE NANO 2ND GEN) 32G X 4 MM MISC [id number]  USE 1 PEN NEEDLE WITH INSULIN  PEN once daily Clark, Katherine K, NP  Active   LANTUS  SOLOSTAR 100 UNIT/ML Solostar Pen [id number]  INJECT 30 UNITS SUBCUTANEOUSLY ONCE DAILY FOR DIABETES Clark, Katherine K, NP  Active   linagliptin  (TRADJENTA ) 5 MG TABS tablet [id number]  Take 1 tablet (5 mg total) by mouth daily. for diabetes.  Patient not taking: Reported on 09/20/2023   Clark, Katherine K, NP  Active   losartan  (COZAAR ) 100 MG tablet [id number]  Take 1 tablet (100 mg total) by mouth daily. Burnard Debby LABOR, MD  Active   metoprolol  succinate (TOPROL -XL) 50 MG 24 hr tablet [id number]  TAKE 1 TABLET BY MOUTH IN THE MORNING AND AT BEDTIME TAKE  WITH  OR  IMMEDIATELY  FOLLOWING  A  MEAL Burnard Debby LABOR, MD  Active   mometasone (NASONEX) 50 MCG/ACT nasal spray [id number]  Place 2 sprays into the nose daily. [provider]  Active   Multiple Vitamin (MULTIVITAMIN) capsule [id number]  Take 1 capsule by mouth daily. [provider]  Active Self  pantoprazole  (PROTONIX ) 40 MG tablet [id number]  Take 1 tablet by mouth once daily Burnard Debby LABOR, MD  Active  Polyethyl Glycol-Propyl Glycol (SYSTANE OP) [id number]  Apply 2 drops to eye 2 (two) times daily.  [provider]  Active Self  rosuvastatin  (CRESTOR ) 10 MG tablet [id number]  Take 1 tablet (10 mg total) by mouth daily. for cholesterol. Burnard Debby LABOR, MD  Active   sertraline  (ZOLOFT ) 25 MG tablet [id number]  Take 1 tablet (25 mg total) by mouth daily. for anxiety and depression. Gretta Comer POUR, NP  Active   TRAVATAN Z 0.004 % SOLN ophthalmic solution [id number]  Place 1 drop into both eyes at bedtime. [provider]  Active Self           Med Note FELIPE, ELEANOR KATHEE Repress Dec 05, 2014  2:09 PM)              Recommendation:   PCP Follow-up Specialty provider follow-up as scheduled  Follow Up Plan:   Telephone follow up appointment  date/time:  10/10/23 11am  Jamacia Jester, LCSW [facility] Licensed Clinical Social Worker  Direct Dial: [phone]

## 2023-09-25 NOTE — Patient Instructions (Signed)
 Visit Information  Thank you for taking time to visit with me today. Please don't hesitate to contact me if I can be of assistance to you before our next scheduled appointment.  Your next care management appointment is by telephone on 10/10/23 at 11am    Please call the care guide team at [phone] if you need to cancel, schedule, or reschedule an appointment.   Please call the Suicide and Crisis Lifeline: 988 call the [place]  National Suicide Prevention Lifeline: 365-311-0318 or TTY: 307-453-0096 TTY (213) 665-7614) to talk to a trained counselor call 1-800-273-TALK (toll free, 24 hour hotline) if you are experiencing a Mental Health or Behavioral Health Crisis or need someone to talk to.  Harleigh Civello, LCSW [place]  [facility], [facility] Licensed Clinical Social Worker  Direct Dial: [phone]

## 2023-09-27 ENCOUNTER — Other Ambulatory Visit: Payer: Self-pay

## 2023-09-27 NOTE — Patient Outreach (Signed)
 Received message to return patient call. Spoke with patient, denies calling to speak with me. Verified error with message, alologized to patient and confirmed appointment next week on 10/03/23 via phone.

## 2023-09-30 ENCOUNTER — Ambulatory Visit: Admitting: Podiatry

## 2023-10-03 ENCOUNTER — Other Ambulatory Visit: Payer: Self-pay

## 2023-10-03 DIAGNOSIS — Z9889 Other specified postprocedural states: Secondary | ICD-10-CM | POA: Diagnosis not present

## 2023-10-03 DIAGNOSIS — E113313 Type 2 diabetes mellitus with moderate nonproliferative diabetic retinopathy with macular edema, bilateral: Secondary | ICD-10-CM | POA: Diagnosis not present

## 2023-10-03 DIAGNOSIS — Z961 Presence of intraocular lens: Secondary | ICD-10-CM | POA: Diagnosis not present

## 2023-10-03 DIAGNOSIS — H401131 Primary open-angle glaucoma, bilateral, mild stage: Secondary | ICD-10-CM | POA: Diagnosis not present

## 2023-10-03 LAB — HM DIABETES EYE EXAM

## 2023-10-03 NOTE — Patient Instructions (Signed)
 Visit Information  Thank you for taking time to visit with me today. Please don't hesitate to contact me if I can be of assistance to you before our next scheduled appointment.  Your next care management appointment is by telephone on 10/31/2023 at 1:00 pm  Telephone follow-up in 1 month  Please call the care guide team at [phone] if you need to cancel, schedule, or reschedule an appointment.   Please call the Suicide and Crisis Lifeline: 988 call the [place]  National Suicide Prevention Lifeline: (646)189-1425 or TTY: 534-238-6363 TTY (662) 877-4088) to talk to a trained counselor call 1-800-273-TALK (toll free, 24 hour hotline) go to [facility] [address] [place] [phone]) call 911 if you are experiencing a Mental Health or Behavioral Health Crisis or need someone to talk to.  Nestora Duos, MSN, RN [facility], [facility] RN Care Manager Direct Dial: [phone] Fax: [phone]

## 2023-10-03 NOTE — Patient Outreach (Signed)
 Complex Care Management   Visit Note  10/03/2023  Name:  Laura Ferguson MRN: [id number] DOB: Dec 05, 1932  Situation: Referral received for Complex Care Management related to Diabetes with Complications and Depression I obtained verbal consent from Patient.  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   LIMA to LAD, SVG to ramus intermedius, SVG to OM1 & OM2, SVG to acute marginal, SVG to distal RCA   Type 2 diabetes mellitus (HCC)     Assessment: Patient Reported Symptoms:  Cognitive Cognitive Status: Alert and oriented to person, place, and time, Insightful and able to interpret abstract concepts, Normal speech and language skills, Struggling with memory recall Cognitive/Intellectual Conditions Management [RPT]: None reported or documented in medical history or problem list   Health Maintenance Behaviors: Annual physical exam Healing Pattern: Average  Neurological Neurological Review of Symptoms: No symptoms reported Neurological Management Strategies: Medication therapy Neurological Comment: no longer dizzy  HEENT HEENT Symptoms Reported: No symptoms reported HEENT Management Strategies: Routine screening, Medication therapy HEENT Comment: saw eye doctor today - no new concerns, eye injections    Cardiovascular Cardiovascular Symptoms Reported: No symptoms reported Cardiovascular Management Strategies: Medication therapy  Respiratory Respiratory Symptoms Reported: No symptoms reported Respiratory Management Strategies: Routine screening  Endocrine Endocrine  Symptoms Reported: No symptoms reported Is patient diabetic?: Yes Is patient checking blood sugars at home?: Yes List most recent blood sugar readings, include date and time of day: CGM not working, sensors coming, manual checks, BG 329 after eating today no fasting    Gastrointestinal Gastrointestinal Symptoms Reported: Other Other Gastrointestinal Symptoms: occasional reflux Gastrointestinal Management Strategies: Medication therapy    Genitourinary Genitourinary Symptoms Reported: No symptoms reported    Integumentary Integumentary Symptoms Reported: Not assessed    Musculoskeletal Musculoskelatal Symptoms Reviewed: No symptoms reported   Falls in the past year?: No Number of falls in past year: 1 or less Was there an injury with Fall?: No Fall Risk Category Calculator: 0 Patient Fall Risk Level: Low Fall Risk Fall risk Follow up: Falls evaluation completed, Falls prevention discussed  Psychosocial Psychosocial Symptoms Reported: No symptoms reported Additional Psychological Details: mood much improved, reports taking her medication and feeling much better Behavioral Management Strategies: Coping strategies Major Change/Loss/Stressor/Fears (CP): Medical condition, self Techniques to Cope with Loss/Stress/Change: Diversional activities      10/03/2023    PHQ2-9 Depression Screening   Little interest or pleasure in doing things Not at all  Feeling down, depressed, or hopeless Several days  PHQ-2 - Total Score 1  Trouble falling or staying asleep, or sleeping too much    Feeling tired or having little energy    Poor appetite or overeating     Feeling bad about yourself - or that you are a failure or have let yourself or your family down    Trouble concentrating on things, such as reading the newspaper or watching television    Moving or speaking so slowly that other people could have noticed.  Or the opposite - being so fidgety or restless  that you have been moving around a lot  more than usual    Thoughts that you would be better off dead, or hurting yourself in some way    PHQ2-9 Total Score    If you checked off any problems, how difficult have these problems made it for you to do your work, take care of things at home, or get along with other people    Depression Interventions/Treatment      Vitals:   10/03/23 1419  BP: (!) 143/75  Pulse: 63    Medications Reviewed Today     Reviewed by Devra Lands, RN (Registered Nurse) on 10/03/23 at 1410  Med List Status: <None>   Medication Order Taking? Sig Documenting Provider Last Dose Status Informant  Acetaminophen (TYLENOL 8 HOUR PO) [id number] Yes Take 1 tablet by mouth every 8 (eight) hours as needed.  [provider]  Active Self  aspirin 81 MG tablet [id number] Yes Take 81 mg by mouth daily. [provider]  Active Self  Azelastine HCl 137 MCG/SPRAY SOLN [id number]  As needed  Patient not taking: Reported on 10/03/2023   [provider]  Active   Blood Glucose Monitoring Suppl (ACCU-CHEK GUIDE) w/Device KIT [id number] Yes Use as instructed to check blood sugar. Trixie File, MD  Active   Cholecalciferol (VITAMIN D) 50 MCG (2000 UT) CAPS [id number] Yes Take 1 capsule by mouth daily. [provider]  Active   clotrimazole -betamethasone  (LOTRISONE ) cream [id number] Yes Apply to both feet and between toes bid x 6 Elijah Phommachanh. Gaynel Delon CROME, DPM  Active   Continuous Glucose Sensor (FREESTYLE LIBRE 3 PLUS SENSOR) MISC [id number] Yes Change sensor every 15 days. Gretta Comer POUR, NP  Active   Continuous Glucose Sensor (FREESTYLE LIBRE 3 SENSOR) [place] [id number] Yes APPLY SENSOR EVERY 14 DAYS TO CONTINOUSLY MONITOR GLUCOSE Clark, Katherine K, NP  Active   ezetimibe  (ZETIA ) 10 MG tablet [id number] Yes Take 1 tablet (10 mg total) by mouth daily. Burnard Debby LABOR, MD  Active   fexofenadine (ALLEGRA) 180 MG tablet [id number] Yes Take 180 mg by mouth daily. [provider]  Active    glucose blood (ACCU-CHEK GUIDE) test strip [id number] Yes USE TO CHECK SUGAR THREE TIMES DAILY AS DIRECTED Trixie File, MD  Active   Insulin  Pen Needle (BD PEN NEEDLE NANO 2ND GEN) 32G X 4 MM MISC [id number] Yes USE 1 PEN NEEDLE WITH INSULIN  PEN once daily Clark, Katherine K, NP  Active   LANTUS  SOLOSTAR 100 UNIT/ML Solostar Pen [id number] Yes INJECT 30 UNITS SUBCUTANEOUSLY ONCE DAILY FOR DIABETES Clark, Katherine K, NP  Active   linagliptin  (TRADJENTA ) 5 MG TABS tablet [id number] Yes Take 1 tablet (5 mg total) by mouth daily. for diabetes. Clark, Katherine K, NP  Active   losartan  (COZAAR ) 100 MG tablet [id number] Yes Take 1 tablet (100 mg total) by mouth daily. Burnard Debby LABOR, MD  Active   metoprolol  succinate (TOPROL -XL) 50 MG 24 hr tablet [id number] Yes TAKE 1 TABLET BY MOUTH IN THE MORNING AND AT BEDTIME TAKE  WITH  OR  IMMEDIATELY  FOLLOWING  A  MEAL Burnard Debby LABOR, MD  Active   mometasone (NASONEX) 50 MCG/ACT nasal spray [id number]  Place 2 sprays into the nose daily.  Patient not taking: Reported on 10/03/2023   [provider]  Active   Multiple Vitamin (MULTIVITAMIN) capsule [id number] Yes Take 1 capsule by mouth daily. [provider]  Active Self  pantoprazole  (PROTONIX ) 40 MG tablet [id number] Yes Take 1  tablet by mouth once daily Burnard Debby LABOR, MD  Active   Polyethyl Glycol-Propyl Glycol (SYSTANE OP) [id number] Yes Apply 2 drops to eye 2 (two) times daily.  [provider]  Active Self  rosuvastatin  (CRESTOR ) 10 MG tablet [id number] Yes Take 1 tablet (10 mg total) by mouth daily. for cholesterol. Burnard Debby LABOR, MD  Active   sertraline  (ZOLOFT ) 25 MG tablet [id number] Yes Take 1 tablet (25 mg total) by mouth daily. for anxiety and depression. Gretta Comer POUR, NP  Active   TRAVATAN Z 0.004 % SOLN ophthalmic solution [id number] Yes Place 1 drop into both eyes at bedtime. [provider]  Active Self           Med Note FELIPE, MELISSA B   Sun Dec 05, 2014  2:09 PM)              Recommendation:   PCP Follow-up Continue Current Plan of Care  Follow Up Plan:   Telephone follow-up in 1 month  Nestora Duos, MSN, RN [facility]  [facility], [facility] RN Care Manager Direct Dial: [phone] Fax: [phone]

## 2023-10-08 DIAGNOSIS — E113312 Type 2 diabetes mellitus with moderate nonproliferative diabetic retinopathy with macular edema, left eye: Secondary | ICD-10-CM | POA: Diagnosis not present

## 2023-10-08 DIAGNOSIS — Z961 Presence of intraocular lens: Secondary | ICD-10-CM | POA: Diagnosis not present

## 2023-10-08 DIAGNOSIS — H35373 Puckering of macula, bilateral: Secondary | ICD-10-CM | POA: Diagnosis not present

## 2023-10-08 DIAGNOSIS — H401132 Primary open-angle glaucoma, bilateral, moderate stage: Secondary | ICD-10-CM | POA: Diagnosis not present

## 2023-10-08 DIAGNOSIS — H43813 Vitreous degeneration, bilateral: Secondary | ICD-10-CM | POA: Diagnosis not present

## 2023-10-08 DIAGNOSIS — H35033 Hypertensive retinopathy, bilateral: Secondary | ICD-10-CM | POA: Diagnosis not present

## 2023-10-09 ENCOUNTER — Encounter: Payer: Self-pay | Admitting: Pharmacist

## 2023-10-09 NOTE — Progress Notes (Unsigned)
 Brief Telephone Documentation Reason for Call: Patient's daughter called in regarding question for pharmacist   Summary of Call: She states Ms Gaige has been very worked up the past few days as she is having trouble with her BG reader.   States her [place] reader may have gotten alcohol in the charging port during recent vacation.  Since then, seems she may have been trying to use glucometer. Daughter states she bought 100 test strips at the pharmacy last week and patient has already run out given issues getting the meter to work or other issue.  -------------------------------------  Patient notes that she packed her [place] reader in her bag and there may have been a spill in her bag. Says she tried the [place] 2 reader. Unclear if this is the issue as she wears 3+ sensors. Hard to get a straight answer though seems [place] 3 reader will not turn on at all.   Patient reports she has been using glucometer. This morning 143 mg/dL. Strips need to be put into the meter just perfect so she has gone through a lot of strips seemingly due to errors, though patient was not able to verbalize exactly what the issue is she is having. Today and yesterday has gotten successful readings.   Plan: Patient may be able to get a replacement reader through [place] if she has not already gotten one through them within the past 12 months. Likely will not be able to get through insurance given <5 years since last.  If [employer] cannot send her one, may be able to request override from insurance company for lost/damaged medication.    Follow Up: Patient given direct line for further questions/concerns.  Manuelita FABIENE Kobs, PharmD Clinical Pharmacist [facility] [phone]

## 2023-10-10 ENCOUNTER — Other Ambulatory Visit: Payer: Self-pay | Admitting: *Deleted

## 2023-10-11 NOTE — Patient Outreach (Addendum)
 Complex Care Management   Visit Note  10/11/2023  Name:  Laura Ferguson MRN: [id number] DOB: 21-Jun-1932  Situation: Referral received for Complex Care Management related to Mental/Behavioral Health diagnosis depression I obtained verbal consent from Patient.  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   LIMA to LAD, SVG to ramus intermedius, SVG to OM1 & OM2, SVG to acute marginal, SVG to distal RCA   Type 2 diabetes mellitus (HCC)     Assessment: Patient Reported Symptoms:  Cognitive Cognitive Status: Alert and oriented to person, place, and time, Insightful and able to interpret abstract concepts, Normal speech and language skills Cognitive/Intellectual Conditions Management [RPT]: None reported or documented in medical history or problem list   Health Maintenance Behaviors: Annual physical exam Healing Pattern: Average Health Facilitated by: Prayer/meditation  Neurological Neurological Review of Symptoms: No symptoms reported    HEENT HEENT Symptoms Reported: No symptoms reported      Cardiovascular Cardiovascular Symptoms Reported: No symptoms reported    Respiratory Respiratory Symptoms Reported: No symptoms reported    Endocrine Endocrine Symptoms Reported: No symptoms reported Is patient diabetic?: Yes Is patient checking blood sugars at home?: Yes List most recent blood sugar readings, include date and time of day: 142 10/10/23, CGM still not working, does not have senors, been using the [employer] working with pharmacist  to get CGM  working, feeling much better not quite as a dizzy Endocrine Self-Management Outcome: 4 (good) Endocrine Comment: reports taking her medication as prescribed-has appointment with podiatrist next week and will get  diabetic shoes  Gastrointestinal Gastrointestinal Symptoms Reported: No symptoms reported      Genitourinary Genitourinary Symptoms Reported: No symptoms reported    Integumentary Integumentary Symptoms Reported: No symptoms reported    Musculoskeletal Musculoskelatal Symptoms Reviewed: No symptoms reported Additional Musculoskeletal Details: dizziness has improved Musculoskeletal Management Strategies: Medication therapy      Psychosocial Psychosocial Symptoms Reported: No symptoms reported Additional Psychological Details: mood continues to improve i am pushing foward I still get tearful when i think about things Behavioral Management Strategies: Coping strategies Behavioral Health Self-Management Outcome: 4 (good) Major Change/Loss/Stressor/Fears (CP): Medical condition, self Behaviors When Feeling Stressed/Fearful: continues to [employer], plants flowers, visits with friend at the nusring home and neighboros Techniques to [place] with Loss/Stress/Change: Diversional activities Quality of Family Relationships: supportive, involved, helpful Do you feel physically threatened by others?: No    10/11/2023    PHQ2-9 Depression Screening   Little interest or pleasure in doing things Not at all  Feeling down, depressed, or hopeless Several days  PHQ-2 - Total Score 1  Trouble falling or staying asleep, or sleeping too much    Feeling tired or having little energy    Poor appetite or overeating     Feeling bad about yourself - or that you are a failure or have let yourself or your family down    Trouble concentrating on things, such as reading the newspaper or watching television    Moving or speaking so  slowly that other people could have noticed.  Or the opposite - being  so fidgety or restless that you have been moving around a lot more than usual    Thoughts that you would be better off dead, or hurting yourself in some way    PHQ2-9 Total Score    If you checked off any problems, how difficult have these problems made it for you to do your work, take care of things at home, or get along with other people    Depression Interventions/Treatment      There were no vitals filed for this visit.  Medications Reviewed Today     Reviewed by Ermalinda Lenn HERO, LCSW (Social Worker) on 10/10/23 at 1122  Med List Status: <None>   Medication Order Taking? Sig Documenting Provider Last Dose Status Informant  Acetaminophen (TYLENOL 8 HOUR PO) [id number]  Take 1 tablet by mouth every 8 (eight) hours as needed.  [provider]  Active Self  aspirin 81 MG tablet [id number]  Take 81 mg by mouth daily. [provider]  Active Self  Azelastine HCl 137 MCG/SPRAY SOLN [id number]  As needed  Patient not taking: Reported on 10/03/2023   [provider]  Active   Blood Glucose Monitoring Suppl (ACCU-CHEK GUIDE) w/Device KIT [id number]  Use as instructed to check blood sugar. Trixie File, MD  Active   Cholecalciferol (VITAMIN D) 50 MCG (2000 UT) CAPS [id number]  Take 1 capsule by mouth daily. [provider]  Active   clotrimazole -betamethasone  (LOTRISONE ) cream [id number]  Apply to both feet and between toes bid x 6 weeks. Gaynel Delon CROME, DPM  Active   Continuous Glucose Sensor (FREESTYLE LIBRE 3 PLUS SENSOR) MISC [id number]  Change sensor every 15 days. Gretta Comer POUR, NP  Active   Continuous Glucose Sensor (FREESTYLE LIBRE 3 SENSOR) [place] [id number]  APPLY SENSOR EVERY 14 DAYS TO CONTINOUSLY MONITOR GLUCOSE Gretta Comer POUR, NP  Active   ezetimibe  (ZETIA ) 10 MG tablet [id number]  Take 1 tablet (10 mg total) by mouth daily. Burnard Debby LABOR, MD  Active   fexofenadine [facility]) 180 MG tablet [id number]  Take 180 mg by mouth daily. [provider]  Active   glucose blood (ACCU-CHEK GUIDE) test strip [id number]  USE TO CHECK SUGAR THREE TIMES DAILY AS DIRECTED Trixie File, MD  Active   Insulin  Pen Needle (BD PEN NEEDLE NANO 2ND GEN) 32G X 4 MM MISC [id number]  USE 1 PEN NEEDLE WITH INSULIN  PEN once daily Clark, Katherine K, NP  Active   LANTUS  SOLOSTAR 100 UNIT/ML Solostar Pen [id number]  INJECT 30 UNITS SUBCUTANEOUSLY ONCE DAILY FOR DIABETES Clark, Katherine K, NP  Active   linagliptin  (TRADJENTA ) 5 MG TABS tablet [id number]  Take 1 tablet (5 mg total) by mouth daily. for diabetes. Gretta Comer POUR, NP  Active   losartan  (COZAAR ) 100 MG tablet [id number]  Take 1 tablet (100 mg total) by mouth daily. Burnard Debby LABOR, MD  Active   metoprolol  succinate (TOPROL -XL) 50 MG 24 hr tablet [id number]  TAKE 1 TABLET BY MOUTH IN THE MORNING AND AT BEDTIME TAKE  WITH  OR  IMMEDIATELY  FOLLOWING  A  MEAL Burnard Debby LABOR, MD  Active   mometasone (NASONEX) 50 MCG/ACT nasal spray [id number]  Place 2 sprays into the nose daily.  Patient not taking: Reported on 10/03/2023   [provider]  Active   Multiple Vitamin (MULTIVITAMIN) capsule [id number]  Take 1 capsule by mouth daily. [provider]  Active Self  pantoprazole  (PROTONIX ) 40 MG tablet [id number]  Take 1 tablet by mouth once daily Burnard Debby LABOR, MD  Active   Polyethyl Glycol-Propyl Glycol (SYSTANE OP) [id number]  Apply 2 drops to eye 2 (two) times daily.  [provider]  Active Self  rosuvastatin  (CRESTOR ) 10 MG tablet [id number]  Take 1 tablet (10 mg total) by mouth daily. for cholesterol. Burnard Debby LABOR, MD  Active   sertraline  (ZOLOFT ) 25 MG tablet [id number]  Take 1 tablet (25 mg total) by mouth daily. for anxiety and depression. Gretta Comer POUR, NP  Active   TRAVATAN Z 0.004 % SOLN ophthalmic solution [id number]  Place 1 drop into both eyes at bedtime. [provider]  Active Self           Med Note FELIPE, ELEANOR KATHEE Repress Dec 05, 2014  2:09 PM)               Recommendation:   PCP Follow-up Continue Current Plan of Care  Follow Up Plan:   Telephone follow up appointment date/time:  10/30/23  Lenn Mean, LCSW [place]  [facility], [facility] Licensed Clinical Social Worker  Direct Dial: [phone]

## 2023-10-11 NOTE — Patient Instructions (Signed)
 Visit Information  Thank you for taking time to visit with me today. Please don't hesitate to contact me if I can be of assistance to you before our next scheduled appointment.  Your next care management appointment is by telephone on 10/30/23 at 1pm   Please call the care guide team at [phone] if you need to cancel, schedule, or reschedule an appointment.   Please call the Suicide and Crisis Lifeline: 988 call the [place]  National Suicide Prevention Lifeline: (912)265-8125 or TTY: (385) 083-4963 TTY 337-331-1502) to talk to a trained counselor call 1-800-273-TALK (toll free, 24 hour hotline) if you are experiencing a Mental Health or Behavioral Health Crisis or need someone to talk to.  Tanda Morrissey, LCSW [place]  [facility], [facility] Licensed Clinical Social Worker  Direct Dial: [phone]

## 2023-10-14 ENCOUNTER — Ambulatory Visit: Admitting: Podiatry

## 2023-10-14 VITALS — Ht 63.0 in | Wt 116.0 lb

## 2023-10-14 DIAGNOSIS — B353 Tinea pedis: Secondary | ICD-10-CM

## 2023-10-14 NOTE — Progress Notes (Signed)
  Subjective:  Patient ID: Laura Ferguson, female    DOB: 1932/09/10,  MRN: [id number]  Chief Complaint  Patient presents with   Tinea Pedis    Rm 6 Pt is here to f/u on bilateral tinea pedis.     89 y.o. female presents with the above complaint. History confirmed with patient.  She is referred to me by Dr. Gaynel for evaluation of tinea pedis, has been using Lotrisone  cream once or twice a day  Objective:  Physical Exam: warm, good capillary refill, normal DP and PT pulses, and tinea pedis in moccasin distribution improving.   Assessment:   1. Tinea pedis of both feet      Plan:  Patient was evaluated and treated and all questions answered.  Agree with use of Lotrisone  cream, advised to use twice daily also discussed skin hydration and advised against picking at the skin or peeling of the dry skin areas as this can cause increased inflammatory response.  Some level of dermatitis and eczematous response to be present here as well.  Follow-up me as needed advised her this should resolve within the next 1 to 2 months if not or worsens at any point to notify me for reevaluation  No follow-ups on file.

## 2023-10-16 ENCOUNTER — Other Ambulatory Visit: Payer: Self-pay | Admitting: Pharmacist

## 2023-10-16 ENCOUNTER — Telehealth: Payer: Self-pay

## 2023-10-16 ENCOUNTER — Telehealth: Payer: Self-pay | Admitting: Pharmacist

## 2023-10-16 ENCOUNTER — Encounter: Payer: Self-pay | Admitting: Pharmacist

## 2023-10-16 ENCOUNTER — Other Ambulatory Visit: Payer: Self-pay | Admitting: Primary Care

## 2023-10-16 DIAGNOSIS — E11319 Type 2 diabetes mellitus with unspecified diabetic retinopathy without macular edema: Secondary | ICD-10-CM

## 2023-10-16 NOTE — Telephone Encounter (Signed)
 Patient was identified as falling into the [place] Measure - Diabetes.   Patient was: Appointment scheduled with primary care provider in the next 30 days.  Referred to Diabetes Management.

## 2023-10-16 NOTE — Telephone Encounter (Signed)
 Brief Telephone Documentation Reason for Call: Patient returning call  Summary of Call: Patient notes she is having trouble with getting her blood sugar readings. Has been using glucometer in the setting of water-damage to [facility] reader. New reader previously pended to prescriber - will attempt to see if insurance can override given previous broke.   Regarding glucometer, it remains very unclear what issue(s) patient is having. Most of her sentences are ineligible and contradictory. Attempted reviewing lgucometer technique and it seems she may be pricking her finger before placing the strip in the machine. Advised her the strip must go in first though she reports her reader does not work this way.   Also mentions OTC glucometer/test strips her daughter bought for her then switches to reporting Libre test strips vs other brands. States her Herlene is broken and will not turn on at all, but also states that her Herlene worked to check her BG this morning.   Patient denies in-person visit to clarify what she is doing wrong with the glucometer. Will continue to work on Community education officer override for [name].   Shortly after our call, patient called back asking if she could get some Libre sensor samples. However, she has continuously reported that the issue is not the sensor, she continues reporting her Herlene reader is water damaged/broken. She should be able to get [place] sensor refills without issue.   Called [facility] to have [place] refilled. On hold for 5 minutes, then they hung up.. Will call again in the morning.    Follow Up: Patient given direct line for further questions/concerns.  Manuelita FABIENE Kobs, PharmD Clinical Pharmacist [facility] [phone]

## 2023-10-16 NOTE — Telephone Encounter (Signed)
 Copied from CRM #[id number]. Topic: General - Other >> Oct 16, 2023 11:16 AM Aleatha C wrote: Reason for CRM: Patient want a call from lindsey she didn't want to leave any other information she says lindsey knows what's its for and its an emergency to give a call back at [phone]

## 2023-10-16 NOTE — Progress Notes (Signed)
 Attempted to contact patient - returning call regarding patient question for the pharmacist.   Left HIPAA compliant message for patient to return my call at their convenience.  Direct callback number provided.   Manuelita FABIENE Kobs, PharmD Ph: [phone]

## 2023-10-18 ENCOUNTER — Encounter: Payer: Self-pay | Admitting: Pharmacist

## 2023-10-18 MED ORDER — FREESTYLE LIBRE 3 READER DEVI: 0 refills | Status: AC

## 2023-10-18 NOTE — Progress Notes (Signed)
 Chart Review Reason: Mediation Access  Summary: Called insurance company: [employer] states that they will approve a replacement [place] 3 reader and are showing a successful paid claim. Authorization date 10/1/5-12/2024, Authorization ID [id number]  Attempted to call [facility] 3 times today, no answer. Ringing >5 minutes. 4th attempt, pharmacy confirms both sensor and reader are covered.   Called patient to update her on [place] approval. Also left her daughter a message regarding approval/Rx ready at [facility]. She also states today that she was able to use her glucometer successfully. BG today 126, then ~200 after eating.   Laura Ferguson, PharmD Clinical Pharmacist [facility] [phone]

## 2023-10-21 ENCOUNTER — Telehealth: Payer: Self-pay

## 2023-10-21 NOTE — Patient Outreach (Signed)
 RNCM returned call to patient's daughter Laura Ferguson re message had not heard from [facility] re Palliative Care referral. Send email to [facility] and determined patient had declined due to too many appointments. Spoke with Zebedee a given number to call, encouraged to do call with patient to help in understanding services. Made aware of patient appointment with LCSW and encouraged to ask patient if she can be on call also.

## 2023-10-30 ENCOUNTER — Encounter: Payer: Self-pay | Admitting: *Deleted

## 2023-10-30 ENCOUNTER — Telehealth: Payer: Self-pay | Admitting: *Deleted

## 2023-10-31 ENCOUNTER — Other Ambulatory Visit: Payer: Self-pay

## 2023-10-31 NOTE — Patient Outreach (Signed)
 Complex Care Management   Visit Note  10/31/2023  Name:  Laura Ferguson MRN: [id number] DOB: Jun 22, 1932  Situation: Referral received for Complex Care Management related to Diabetes with Complications and Depression I obtained verbal consent from Patient.  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   LIMA to LAD, SVG to ramus intermedius, SVG to OM1 & OM2, SVG to acute marginal, SVG to distal RCA   Type 2 diabetes mellitus (HCC)     Assessment: Patient Reported Symptoms:  Cognitive Cognitive Status: No symptoms reported Cognitive/Intellectual Conditions Management [RPT]: None reported or documented in medical history or problem list   Health Maintenance Behaviors: Annual physical exam Health Facilitated by: Prayer/meditation  Neurological Neurological Review of Symptoms: Dizziness Neurological Management Strategies: Medication therapy Neurological Comment: reports dizzy at times in am or when standing - aware of safety precautions, no falls  HEENT HEENT Symptoms Reported: No symptoms reported HEENT Comment: continues with eye injections    Cardiovascular Cardiovascular Symptoms Reported: No symptoms reported Does patient have uncontrolled Hypertension?: No Cardiovascular Management Strategies: Medication therapy, Routine screening Cardiovascular Comment: 139/64 HR 55, reports losing weight based on how clothes fit  despite good appetite  Respiratory Respiratory Symptoms Reported: No symptoms reported Respiratory Management Strategies: Routine screening   Endocrine Endocrine Symptoms Reported: No symptoms reported Is patient diabetic?: Yes Is patient checking blood sugars at home?: Yes List most recent blood sugar readings, include date and time of day: FBG 115 today, unsure when last low was - 69, eats snack before bed, using libre and BGM as needed, unable to provide range at this visit, noted to be followed by [facility] for DM, patient states not working with them but does not answer many calls due to so many scam calls    Gastrointestinal Gastrointestinal Symptoms Reported: No symptoms reported Other Gastrointestinal Symptoms: occasional reflux but relates to food choices Gastrointestinal Management Strategies: Medication therapy    Genitourinary Genitourinary Symptoms Reported: No symptoms reported Other Genitourinary Symptoms: no pain    Integumentary Integumentary Symptoms Reported: No symptoms reported    Musculoskeletal Musculoskelatal Symptoms Reviewed: No symptoms reported Additional Musculoskeletal Details: no cane/walker, no new falls/near falls Musculoskeletal Management Strategies: Medication therapy Falls in the past year?: No Number of falls in past year: 1 or less Patient at Risk for Falls Due to: Orthopedic patient Fall risk Follow up: Falls evaluation completed, Falls prevention discussed  Psychosocial Psychosocial Symptoms Reported: No symptoms reported Additional Psychological Details: states doing much better re mood, not crying like before/coping better per patient, sleeping better, follwed by LCSW Behavioral Management Strategies: Coping strategies Major Change/Loss/Stressor/Fears (CP): Medical condition, self      10/31/2023    PHQ2-9 Depression Screening   Little interest or pleasure in doing things Not at all  Feeling down, depressed, or hopeless Several days (down)  PHQ-2 - Total Score 1  Trouble falling or staying asleep, or sleeping too much    Feeling tired or having little energy    Poor appetite  or overeating  Feeling bad about yourself - or that you are a failure or have let yourself or your family down    Trouble concentrating on things, such as reading the newspaper or watching television    Moving or speaking so slowly that other people could have noticed.  Or the opposite - being so fidgety or restless that you have been moving around a lot more than usual    Thoughts that you would be better off dead, or hurting yourself in some way    PHQ2-9 Total Score    If you checked off any problems, how difficult have these problems made it for you to do your work, take care of things at home, or get along with other people    Depression Interventions/Treatment      Vitals:   10/31/23 1317  BP: 139/64  Pulse: (!) 55    Medications Reviewed Today     Reviewed by Devra Lands, RN (Registered Nurse) on 10/31/23 at 1315  Med List Status: <None>   Medication Order Taking? Sig Documenting Provider Last Dose Status Informant  Acetaminophen (TYLENOL 8 HOUR PO) [id number] Yes Take 1 tablet by mouth every 8 (eight) hours as needed.  [provider]  Active Self  aspirin 81 MG tablet [id number] Yes Take 81 mg by mouth daily. [provider]  Active Self  Azelastine HCl 137 MCG/SPRAY SOLN [id number]  As needed  Patient not taking: Reported on 10/03/2023   [provider]  Active   Blood Glucose Monitoring Suppl (ACCU-CHEK GUIDE) w/Device KIT [id number] Yes Use as instructed to check blood sugar. Trixie File, MD  Active   Cholecalciferol (VITAMIN D) 50 MCG (2000 UT) CAPS [id number] Yes Take 1 capsule by mouth daily. [provider]  Active   clotrimazole -betamethasone  (LOTRISONE ) cream [id number] Yes Apply to both feet and between toes bid x 6 [place] Gharibian. Gaynel Delon CROME, DPM  Active   Continuous Glucose Receiver (FREESTYLE LIBRE 3 READER) [place]VI [id number] Yes Use to check blood sugar continuously (broken reader, try for lost/damaged med override). E11.65,  Z79.4 Wendee Lynwood HERO, NP  Active   Continuous Glucose Sensor (FREESTYLE LIBRE 3 PLUS SENSOR) MISC [id number] Yes Change sensor every 15 days. Gretta Comer POUR, NP  Active   Continuous Glucose Sensor (FREESTYLE LIBRE 3 SENSOR) [place] [id number] Yes APPLY ONE SENSOR EVERY 14 DAYS TO CONTINOUSLY MONITOR GLUCOSE Clark, Katherine K, NP  Active   ezetimibe  (ZETIA ) 10 MG tablet [id number] Yes Take 1 tablet (10 mg total) by mouth daily. Burnard Debby LABOR, MD  Active   fexofenadine (ALLEGRA) 180 MG tablet [id number] Yes Take 180 mg by mouth daily. [provider]  Active   glucose blood (ACCU-CHEK GUIDE) test strip [id number] Yes USE TO CHECK SUGAR THREE TIMES DAILY AS DIRECTED Trixie File, MD  Active   Insulin  Pen Needle (BD PEN NEEDLE NANO 2ND GEN) 32G X 4 MM MISC [id number] Yes USE 1 PEN NEEDLE WITH INSULIN  PEN once daily Clark, Katherine K, NP  Active   LANTUS  SOLOSTAR 100 UNIT/ML Solostar Pen [id number] Yes INJECT 30 UNITS SUBCUTANEOUSLY ONCE DAILY FOR DIABETES Clark, Katherine K, NP  Active   linagliptin  (TRADJENTA ) 5 MG TABS tablet [id number] Yes Take 1 tablet (5 mg total) by mouth daily. for diabetes. Gretta Comer POUR, NP  Active   losartan  (COZAAR ) 100 MG tablet [id number] Yes Take 1 tablet (100 mg total) by mouth daily. Burnard Debby LABOR, MD  Active   metoprolol  succinate (TOPROL -XL) 50 MG 24 hr tablet [id number] Yes  TAKE 1 TABLET BY MOUTH IN THE MORNING AND AT BEDTIME TAKE  WITH  OR  IMMEDIATELY  FOLLOWING  A  MEAL Burnard Debby LABOR, MD  Active   mometasone (NASONEX) 50 MCG/ACT nasal spray [id number] Yes Place 2 sprays into the nose daily. [provider]  Active   Multiple Vitamin (MULTIVITAMIN) capsule [id number] Yes Take 1 capsule by mouth daily. [provider]  Active Self  pantoprazole  (PROTONIX ) 40 MG tablet [id number] Yes Take 1 tablet by mouth once daily Burnard Debby LABOR, MD  Active   Polyethyl Glycol-Propyl Glycol (SYSTANE OP) [id number] Yes Apply 2 drops to eye 2 (two) times  daily.  [provider]  Active Self  rosuvastatin  (CRESTOR ) 10 MG tablet [id number] Yes Take 1 tablet (10 mg total) by mouth daily. for cholesterol. Burnard Debby LABOR, MD  Active   sertraline  (ZOLOFT ) 25 MG tablet [id number]  Take 1 tablet (25 mg total) by mouth daily. for anxiety and depression.  Patient not taking: Reported on 10/31/2023   Clark, Katherine K, NP  Active   TRAVATAN Z 0.004 % SOLN ophthalmic solution [id number] Yes Place 1 drop into both eyes at bedtime. [provider]  Active Self           Med Note FELIPE, MELISSA B   Sun Dec 05, 2014  2:09 PM)              Recommendation:   PCP Follow-up Continue Current Plan of Care  Follow Up Plan:   Telephone follow-up in 1 month  Nestora Duos, MSN, RN [facility]  [facility], [facility] RN Care Manager Direct Dial: [phone] Fax: [phone]

## 2023-10-31 NOTE — Patient Instructions (Signed)
 Visit Information  Thank you for taking time to visit with me today. Please don't hesitate to contact me if I can be of assistance to you before our next scheduled appointment.  Your next care management appointment is by telephone on 11/28/2023 at 1:00 pm   Telephone follow-up in 1 month  Please call the care guide team at [phone] if you need to cancel, schedule, or reschedule an appointment.   Please call the Suicide and Crisis Lifeline: 988 call the [place]  National Suicide Prevention Lifeline: (539) 230-8006 or TTY: 619-191-3222 TTY 510 704 0805) to talk to a trained counselor call 1-800-273-TALK (toll free, 24 hour hotline) go to [facility] [address] [place] [phone]) call 911 if you are experiencing a Mental Health or Behavioral Health Crisis or need someone to talk to.  Nestora Duos, MSN, RN [facility], [facility] RN Care Manager Direct Dial: [phone] Fax: [phone]

## 2023-11-05 ENCOUNTER — Telehealth: Payer: Self-pay | Admitting: *Deleted

## 2023-11-05 NOTE — Patient Outreach (Signed)
 Complex Care Management   Visit Note  11/05/2023  Name:  Laura Ferguson MRN: [id number] DOB: May 24, 1932  Situation: Referral received for Complex Care Management related to Mental/Behavioral Health diagnosis depression I obtained verbal consent from Patient.  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   LIMA to LAD, SVG to ramus intermedius, SVG to OM1 & OM2, SVG to acute marginal, SVG to distal RCA   Type 2 diabetes mellitus (HCC)     Assessment: Patient Reported Symptoms:  Cognitive Cognitive Status: Alert and oriented to person, place, and time, Normal speech and language skills Cognitive/Intellectual Conditions Management [RPT]: None reported or documented in medical history or problem list   Health Maintenance Behaviors: Annual physical exam Healing Pattern: Average Health Facilitated by: Prayer/meditation  Neurological Neurological Review of Symptoms: Dizziness Neurological Comment: reports continued dizziness-scheduled to see PCP on 11/06/23  HEENT HEENT Symptoms Reported: No symptoms reported      Cardiovascular Cardiovascular Symptoms Reported: No symptoms reported Does patient have uncontrolled Hypertension?: No Cardiovascular Management Strategies: Medication therapy, Routine screening Cardiovascular Comment: patient reports have a follow up with Cardiologist Dr. Mady on 11/20/23  Respiratory Respiratory Symptoms Reported: No symptoms reported    Endocrine Endocrine Symptoms Reported: No symptoms reported Is patient diabetic?: Yes Is patient checking  blood sugars at home?: Yes List most recent blood sugar readings, include date and time of day: blood sugar 69-reports eating snacks regjularly crackers, tomatoe soup Endocrine Self-Management Outcome: 4 (good) Endocrine Comment: reports taking her medication as prescribed-sees podiatrist, plans to get diabetic shoes either from podiatrist or good [facility]-not sure if insurance covers it  Gastrointestinal Gastrointestinal Symptoms Reported: No symptoms reported      Genitourinary Genitourinary Symptoms Reported: No symptoms reported    Integumentary Integumentary Symptoms Reported: No symptoms reported    Musculoskeletal Musculoskelatal Symptoms Reviewed: No symptoms reported        Psychosocial Psychosocial Symptoms Reported: No symptoms reported Additional Psychological Details: depression symptoms have imrpoved, been going out, attending church, sleep has improved Behavioral Management Strategies: Coping strategies Behavioral Health Self-Management Outcome: 4 (good) Major Change/Loss/Stressor/Fears (CP): Medical condition, self Behaviors When Feeling Stressed/Fearful: continues to listen to [employer], plants flowers, positive self talk, attends church , talks with neighbors Techniques to [employer] with Loss/Stress/Change: Diversional activities, Counseling, Spiritual practice(s) Quality of Family Relationships: supportive, involved, helpful Do you feel physically threatened by others?: No    11/05/2023    PHQ2-9 Depression Screening   Little interest or pleasure in doing things Not at all  Feeling down, depressed, or hopeless Not at all  PHQ-2 - Total Score 0  Trouble falling or staying asleep, or sleeping too much    Feeling tired or having little energy    Poor appetite or overeating     Feeling bad about yourself - or that you are a failure or have let yourself or your family down    Trouble concentrating on things, such as reading the newspaper or watching television    Moving or  speaking so slowly  that other people could have noticed.  Or the opposite - being so fidgety or restless that you have been moving around a lot more than usual    Thoughts that you would be better off dead, or hurting yourself in some way    PHQ2-9 Total Score    If you checked off any problems, how difficult have these problems made it for you to do your work, take care of things at home, or get along with other people    Depression Interventions/Treatment      There were no vitals filed for this visit.  Medications Reviewed Today     Reviewed by Ermalinda Lenn HERO, LCSW (Social Worker) on 11/05/23 at 1228  Med List Status: <None>   Medication Order Taking? Sig Documenting Provider Last Dose Status Informant  Acetaminophen (TYLENOL 8 HOUR PO) [id number] Yes Take 1 tablet by mouth every 8 (eight) hours as needed.  [provider]  Active Self  aspirin 81 MG tablet [id number] Yes Take 81 mg by mouth daily. [provider]  Active Self  Azelastine HCl 137 MCG/SPRAY SOLN [id number]  As needed  Patient not taking: Reported on 11/05/2023   [provider]  Active   Blood Glucose Monitoring Suppl (ACCU-CHEK GUIDE) w/Device KIT [id number] Yes Use as instructed to check blood sugar. Trixie File, MD  Active   Cholecalciferol (VITAMIN D) 50 MCG (2000 UT) CAPS [id number] Yes Take 1 capsule by mouth daily. [provider]  Active   clotrimazole -betamethasone  (LOTRISONE ) cream [id number] Yes Apply to both feet and between toes bid x 6 weeks. Gaynel Delon CROME, DPM  Active   Continuous Glucose Receiver (FREESTYLE LIBRE 3 READER) DEVI [id number] Yes Use to check blood sugar continuously (broken reader, try for lost/damaged med override). E11.65, Z79.4 Wendee Lynwood HERO, NP  Active   Continuous Glucose Sensor (FREESTYLE LIBRE 3 PLUS SENSOR) MISC [id number] Yes Change sensor every 15 days. Gretta Comer POUR, NP  Active   Continuous Glucose Sensor (FREESTYLE LIBRE 3 SENSOR) [place]  [id number] Yes APPLY ONE SENSOR EVERY 14 DAYS TO CONTINOUSLY MONITOR GLUCOSE Clark, Katherine K, NP  Active   ezetimibe  (ZETIA ) 10 MG tablet [id number] Yes Take 1 tablet (10 mg total) by mouth daily. Burnard Debby LABOR, MD  Active   fexofenadine (ALLEGRA) 180 MG tablet [id number] Yes Take 180 mg by mouth daily. [provider]  Active   glucose blood (ACCU-CHEK GUIDE) test strip [id number]  USE TO CHECK SUGAR THREE TIMES DAILY AS DIRECTED Trixie File, MD  Active   Insulin  Pen Needle (BD PEN NEEDLE NANO 2ND GEN) 32G X 4 MM MISC [id number] Yes USE 1 PEN NEEDLE WITH INSULIN  PEN once daily Clark, Katherine K, NP  Active   LANTUS  SOLOSTAR 100 UNIT/ML Solostar Pen [id number] Yes INJECT 30 UNITS SUBCUTANEOUSLY ONCE DAILY FOR DIABETES Clark, Katherine K, NP  Active   linagliptin  (TRADJENTA ) 5 MG TABS tablet [id number] Yes Take 1 tablet (5 mg total) by mouth daily. for diabetes. Gretta Comer POUR, NP  Active   losartan  (COZAAR ) 100 MG tablet [id number] Yes Take 1 tablet (100 mg total) by mouth daily. Burnard Debby LABOR, MD  Active   metoprolol  succinate (TOPROL -XL) 50 MG 24 hr tablet [id number] Yes TAKE 1 TABLET BY MOUTH IN THE MORNING AND AT BEDTIME TAKE  WITH  OR  IMMEDIATELY  FOLLOWING  A  MEAL Burnard Debby LABOR, MD  Active   mometasone (NASONEX) 50 MCG/ACT nasal spray [id number] Yes Place 2 sprays into the nose daily.  [provider]  Active   Multiple Vitamin (MULTIVITAMIN) capsule [id number] Yes Take 1 capsule by mouth daily. [provider]  Active Self  pantoprazole  (PROTONIX ) 40 MG tablet [id number] Yes Take 1 tablet by mouth once daily Burnard Debby LABOR, MD  Active   Polyethyl Glycol-Propyl Glycol (SYSTANE OP) [id number] Yes Apply 2 drops to eye 2 (two) times daily.  [provider]  Active Self  rosuvastatin  (CRESTOR ) 10 MG tablet [id number] Yes Take 1 tablet (10 mg total) by mouth daily. for cholesterol. Burnard Debby LABOR, MD  Active   sertraline  (ZOLOFT ) 25 MG tablet [id number]  Take  1 tablet (25 mg total) by mouth daily. for anxiety and depression.  Patient not taking: Reported on 11/05/2023   Clark, Katherine K, NP  Active   TRAVATAN Z 0.004 % SOLN ophthalmic solution [id number] Yes Place 1 drop into both eyes at bedtime. [provider]  Active Self           Med Note FELIPE, ELEANOR KATHEE Repress Dec 05, 2014  2:09 PM)              Recommendation:   PCP Follow-up Specialty provider follow-up as scheduled  Follow Up Plan:   Telephone follow up appointment date/time:  11/18/23  Lenn Mean, LCSW [place]  [facility], [facility] Licensed Clinical Social Worker  Direct Dial: [phone]

## 2023-11-05 NOTE — Patient Instructions (Signed)
 Visit Information  Thank you for taking time to visit with me today. Please don't hesitate to contact me if I can be of assistance to you before our next scheduled appointment.  Your next care management appointment is by telephone on 11/18/23 at 1pm   Please call the care guide team at [phone] if you need to cancel, schedule, or reschedule an appointment.   Please call the Suicide and Crisis Lifeline: 988 call the [place]  National Suicide Prevention Lifeline: 670-853-5168 or TTY: (986)187-0759 TTY 808-352-2910) to talk to a trained counselor call 1-800-273-TALK (toll free, 24 hour hotline) call 911 if you are experiencing a Mental Health or Behavioral Health Crisis or need someone to talk to.  Johnnae Impastato, LCSW [place]  [facility], [facility] Licensed Clinical Social Worker  Direct Dial: [phone]

## 2023-11-06 ENCOUNTER — Telehealth: Payer: Self-pay | Admitting: Primary Care

## 2023-11-06 ENCOUNTER — Ambulatory Visit: Admitting: Primary Care

## 2023-11-06 NOTE — Telephone Encounter (Signed)
 Pt daughter would like for her to give her a call at [phone]

## 2023-11-06 NOTE — Telephone Encounter (Signed)
 Prescription Request Patient states she took last pill this morning 11/06/2023  LOV: 09/20/2023  What is the name of the medication or equipment? losartan  (COZAAR ) 100 MG tablet   Have you contacted your pharmacy to request a refill? No   Which pharmacy would you like this sent to?  [facility] [address] [place] - [address] [place] [address] Phone: [phone] Fax: [phone]    Patient notified that their request is being sent to the clinical staff for review and that they should receive a response within 2 business days.   Please advise at Mobile [phone] (mobile)

## 2023-11-06 NOTE — Telephone Encounter (Signed)
 Called and spoke with patients daughter, she requested patient appt be rescheduled for later in the day on 11/13. Appt changed for 11/28/23 @ 3:00 pm

## 2023-11-14 ENCOUNTER — Telehealth: Payer: Self-pay

## 2023-11-14 NOTE — Telephone Encounter (Signed)
 Copied from CRM #[id number]. Topic: General - Other >> Nov 14, 2023 10:04 AM Revonda D wrote: Reason for CRM: Pt's daughter is requesting a call from the nurse in regards to the pt. Regina didn't go into detail about the reason for the request.   Attempted to call Angeline, no answer.  Left a message to call the office.

## 2023-11-14 NOTE — Telephone Encounter (Signed)
Patient is returning Nurses call

## 2023-11-15 ENCOUNTER — Other Ambulatory Visit: Payer: Self-pay | Admitting: General Practice

## 2023-11-15 DIAGNOSIS — I10 Essential (primary) hypertension: Secondary | ICD-10-CM

## 2023-11-15 DIAGNOSIS — Z09 Encounter for follow-up examination after completed treatment for conditions other than malignant neoplasm: Secondary | ICD-10-CM

## 2023-11-15 NOTE — Telephone Encounter (Signed)
 Called daughter on dpr. She had some questions about call received from [facility] about pharmacy. It sounds like they are trying to get her to go to mail order but I can't verify that. I have provided her with patient ID number and contact number for insurance. They will reach back out if any other questions.

## 2023-11-18 ENCOUNTER — Telehealth: Payer: Self-pay | Admitting: Primary Care

## 2023-11-18 ENCOUNTER — Telehealth: Payer: Self-pay | Admitting: *Deleted

## 2023-11-18 ENCOUNTER — Other Ambulatory Visit: Payer: Self-pay | Admitting: *Deleted

## 2023-11-18 MED ORDER — METOPROLOL SUCCINATE ER 50 MG PO TB24
50.0000 mg | ORAL_TABLET | Freq: Every day | ORAL | 1 refills | Status: AC
Start: 2023-11-18 — End: ?

## 2023-11-18 MED ORDER — EZETIMIBE 10 MG PO TABS: 10.0000 mg | ORAL_TABLET | Freq: Every day | ORAL | 1 refills | Status: DC

## 2023-11-18 NOTE — Telephone Encounter (Signed)
 Copied from CRM [id number]. Topic: Medical Record Request - Other >> Nov 18, 2023  3:14 PM China J wrote: Reason for CRM: The patinet's daughter is needing a engineer, site to call her so she can go over details she's needing done for [facility] paperwork. She said the documents expired on the 31st and she is needing an extension longer than 6 months.  Please call [phone].

## 2023-11-18 NOTE — Telephone Encounter (Signed)
 Left message for patients daughter to return call to office. Need to follow up regarding FMLA/disability forms.

## 2023-11-18 NOTE — Patient Outreach (Signed)
 Complex Care Management   Visit Note  11/18/2023  Name:  Laura Ferguson MRN: [id number] DOB: September 07, 1932  Situation: Referral received for Complex Care Management related to Mental/Behavioral Health diagnosis depression I obtained verbal consent from Patient.  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   LIMA to LAD, SVG to ramus intermedius, SVG to OM1 & OM2, SVG to acute marginal, SVG to distal RCA   Type 2 diabetes mellitus (HCC)     Assessment: Patient Reported Symptoms:  Cognitive Cognitive Status: Alert and oriented to person, place, and time, Normal speech and language skills   Healing Pattern: Average Health Facilitated by: Prayer/meditation, Stress management, Rest  Neurological Neurological Review of Symptoms: Dizziness (not quite as bad as it well states that she has a history of vertigo) Neurological Management Strategies: Medication therapy Neurological Self-Management Outcome: 4 (good) Neurological Comment: Followed by Dr. Candace appointment-will have to re-schedule appointent for this months  HEENT HEENT Symptoms Reported: No symptoms reported      Cardiovascular Cardiovascular Symptoms Reported: No symptoms reported    Respiratory Respiratory Symptoms Reported: No symptoms reported    Endocrine Endocrine Symptoms Reported: No symptoms reported Is patient diabetic?: Yes Is patient checking blood sugars at home?: Yes List most recent blood sugar readings, include date and time of day: blood sugar 69 this morning 11/18/23 Endocrine Comment:  per patient taking medication as prescribed-drinks 2-3 protein shakes per day  Gastrointestinal Gastrointestinal Symptoms Reported: No symptoms reported      Genitourinary Genitourinary Symptoms Reported: No symptoms reported    Integumentary Integumentary Symptoms Reported: No symptoms reported    Musculoskeletal Musculoskelatal Symptoms Reviewed: No symptoms reported Additional Musculoskeletal Details: patient reports being in no pain today        Psychosocial Additional Psychological Details: depression symptoms imrpving, going out more , sleeping well, attending church Behavioral Management Strategies: Coping strategies Behavioral Health Self-Management Outcome: 4 (good) Major Change/Loss/Stressor/Fears (CP): Medical condition, self Behaviors When Feeling Stressed/Fearful: continues to utilize sungard, gardening, postive self talk, attending church Techniques to [place] with Loss/Stress/Change: Diversional activities, Counseling, Spiritual practice(s) Quality of Family Relationships: supportive, involved, helpful Do you feel physically threatened by others?: No    11/18/2023    PHQ2-9 Depression Screening   Little interest or pleasure in doing things Not at all  Feeling down, depressed, or hopeless Not at all  PHQ-2 - Total Score 0  Trouble falling or staying asleep, or sleeping too much    Feeling tired or having little energy    Poor appetite or overeating     Feeling bad about yourself - or that you are a failure or have let yourself or your family down    Trouble concentrating on things, such as reading the newspaper or watching television    Moving or speaking so slowly that other people could have noticed.  Or the opposite - being so fidgety or restless that you have been moving around a lot more than usual    Thoughts that you would be better off  dead, or hurting yourself in some way    PHQ2-9 Total Score    If you checked off any problems, how difficult have these  problems made it for you to do your work, take care of things at home, or get along with other people    Depression Interventions/Treatment      There were no vitals filed for this visit.  Medications Reviewed Today     Reviewed by Ermalinda Lenn HERO, LCSW (Social Worker) on 11/18/23 at 1315  Med List Status: <None>   Medication Order Taking? Sig Documenting Provider Last Dose Status Informant  Acetaminophen (TYLENOL 8 HOUR PO) [id number] Yes Take 1 tablet by mouth every 8 (eight) hours as needed.  [provider]  Active Self  aspirin 81 MG tablet [id number] Yes Take 81 mg by mouth daily. [provider]  Active Self  Azelastine HCl 137 MCG/SPRAY SOLN [id number]  As needed  Patient not taking: Reported on 11/18/2023   [provider]  Active   Blood Glucose Monitoring Suppl (ACCU-CHEK GUIDE) w/Device KIT [id number] Yes Use as instructed to check blood sugar. Trixie File, MD  Active   Cholecalciferol (VITAMIN D) 50 MCG (2000 UT) CAPS [id number] Yes Take 1 capsule by mouth daily. [provider]  Active   clotrimazole -betamethasone  (LOTRISONE ) cream [id number] Yes Apply to both feet and between toes bid x 6 weeks. Gaynel Delon CROME, DPM  Active   Continuous Glucose Receiver (FREESTYLE LIBRE 3 READER) DEVI [id number] Yes Use to check blood sugar continuously (broken reader, try for lost/damaged med override). E11.65, Z79.4 Wendee Lynwood HERO, NP  Active   Continuous Glucose Sensor (FREESTYLE LIBRE 3 PLUS SENSOR) MISC [id number] Yes Change sensor every 15 days. Gretta Comer POUR, NP  Active   Continuous Glucose Sensor (FREESTYLE LIBRE 3 SENSOR) [place] [id number] Yes APPLY ONE SENSOR EVERY 14 DAYS TO CONTINOUSLY MONITOR GLUCOSE Gretta Comer POUR, NP  Active   ezetimibe  (ZETIA ) 10 MG tablet [id number] Yes Take 1 tablet (10 mg total) by mouth daily. Lelon Hamilton T, PA-C  Active   fexofenadine (ALLEGRA) 180 MG tablet [id number] Yes Take 180 mg by mouth daily. [provider]  Active   glucose blood (ACCU-CHEK GUIDE) test strip [id number] Yes USE TO CHECK SUGAR THREE TIMES DAILY AS DIRECTED Trixie File, MD  Active   Insulin  Pen Needle (BD PEN NEEDLE NANO 2ND GEN) 32G X 4 MM MISC [id number] Yes USE 1 PEN NEEDLE WITH INSULIN  PEN once daily Clark, Katherine K, NP  Active   LANTUS  SOLOSTAR 100 UNIT/ML Solostar Pen [id number] Yes INJECT 30 UNITS SUBCUTANEOUSLY ONCE DAILY FOR DIABETES Clark, Katherine K, NP  Active   linagliptin  (TRADJENTA ) 5 MG TABS tablet [id number] Yes Take 1 tablet (5 mg total) by mouth daily. for diabetes. Clark, Katherine K, NP  Active   losartan  (COZAAR ) 100 MG tablet [id number] Yes Take 1 tablet (100 mg total) by mouth daily. Burnard Debby LABOR, MD  Active   metoprolol  succinate (TOPROL -XL) 50 MG 24 hr tablet [id number] Yes Take 1 tablet (50 mg total) by mouth daily. Take with or immediately following a meal. Lelon, Scott T, PA-C  Active   mometasone (NASONEX) 50 MCG/ACT nasal spray [id number] Yes Place 2 sprays into the nose daily. [provider]  Active   Multiple Vitamin (MULTIVITAMIN) capsule [id number] Yes Take 1 capsule by mouth daily. [provider]  Active Self  pantoprazole  (PROTONIX ) 40 MG tablet [id number] Yes Take 1 tablet by mouth once daily Burnard Debby LABOR, MD  Active   Polyethyl Glycol-Propyl Glycol (SYSTANE OP) [id number] Yes Apply 2 drops to eye 2 (two) times daily.  [provider]  Active Self  rosuvastatin  (CRESTOR ) 10 MG tablet [id number] Yes Take 1 tablet (10 mg total) by mouth daily. for cholesterol. Burnard Debby LABOR, MD  Active   sertraline  (ZOLOFT ) 25 MG tablet [id number]  Take 1 tablet (25 mg total) by mouth daily. for anxiety and depression.  Patient not taking: Reported on 11/18/2023   Clark, Katherine K, NP  Active   TRAVATAN Z 0.004 % SOLN ophthalmic solution [id number] Yes Place 1 drop into both eyes at bedtime. [provider]  Active Self           Med Note FELIPE, ELEANOR KATHEE Repress Dec 05, 2014  2:09 PM)              Recommendation:   PCP Follow-up Specialty provider follow-up as scheduled  Follow Up Plan:   Closing From:  [facility] social work  [employer], [employer]  [employer], [employer] [facility] Licensed Clinical Social Worker  Direct Dial: [phone]

## 2023-11-18 NOTE — Patient Outreach (Signed)
 Phone call to patient to receive verbal permission to speak to her daughter Cipriana Biller. Permission provided to return call to daughter and suggested that daughter Dua Mehler to her Designated Party Release. Patient's daughter contacted. Discussed benefit of  AWV home through her insurance visit scheduled 11/25/23. Confirmed follow up appointments Eye Doctor, 11/26/23, Cardiologist 11/19/23. 11/28/23 PCP and RNCM. Discussed grief support options, Grief Share through area churches and grief counseling through [facility] [phone].   Aunisty Reali, LCSW [place]  [facility], [facility] Licensed Clinical Social Worker  Direct Dial: [phone]

## 2023-11-18 NOTE — Patient Instructions (Signed)
 Visit Information  Thank you for taking time to visit with me today. Please don't hesitate to contact me if I can be of assistance to you before our next scheduled appointment.  Your next care management appointment is no further scheduled appointments.     Please call the care guide team at [phone] if you need to cancel, schedule, or reschedule an appointment.   Please call the Suicide and Crisis Lifeline: 988 call the [place]  National Suicide Prevention Lifeline: 747-214-9034 or TTY: 657 648 3245 TTY 858 412 1577) to talk to a trained counselor call 1-800-273-TALK (toll free, 24 hour hotline) call 911 if you are experiencing a Mental Health or Behavioral Health Crisis or need someone to talk to.   Snyder Colavito, LCSW   [facility], [facility] Licensed Clinical Social Worker  Direct Dial: [phone]

## 2023-11-20 ENCOUNTER — Ambulatory Visit

## 2023-11-20 ENCOUNTER — Ambulatory Visit: Attending: Internal Medicine | Admitting: Internal Medicine

## 2023-11-20 ENCOUNTER — Encounter: Payer: Self-pay | Admitting: Internal Medicine

## 2023-11-20 VITALS — BP 136/60 | HR 63 | Ht 63.0 in | Wt 118.2 lb

## 2023-11-20 DIAGNOSIS — R42 Dizziness and giddiness: Secondary | ICD-10-CM | POA: Diagnosis not present

## 2023-11-20 DIAGNOSIS — I10 Essential (primary) hypertension: Secondary | ICD-10-CM

## 2023-11-20 DIAGNOSIS — I2510 Atherosclerotic heart disease of native coronary artery without angina pectoris: Secondary | ICD-10-CM | POA: Diagnosis not present

## 2023-11-20 DIAGNOSIS — R002 Palpitations: Secondary | ICD-10-CM

## 2023-11-20 DIAGNOSIS — E1169 Type 2 diabetes mellitus with other specified complication: Secondary | ICD-10-CM | POA: Diagnosis not present

## 2023-11-20 DIAGNOSIS — E785 Hyperlipidemia, unspecified: Secondary | ICD-10-CM

## 2023-11-20 NOTE — Telephone Encounter (Signed)
 Reached out to patient's daughter and left VM for the second time to discuss FMLA paperwork. Informed her we could not move forward with her request to extend until I spoke with her.

## 2023-11-20 NOTE — Patient Instructions (Signed)
 Medication Instructions:  Your physician recommends that you continue on your current medications as directed. Please refer to the Current Medication list given to you today.    *If you need a refill on your cardiac medications before your next appointment, please call your pharmacy*  Lab Work: No labs ordered today    Testing/Procedures: ZIO XT- Long Term Monitor Instructions  Your physician has requested you wear a ZIO patch monitor for 14 days.  This is a single patch monitor. [employer] supplies one patch monitor per enrollment. Additional stickers are not available. Please do not apply patch if you will be having a Nuclear Stress Test, Echocardiogram, Cardiac CT, MRI, or Chest Xray during the period you would be wearing the monitor. The patch cannot be worn during these tests. You cannot remove and re-apply the ZIO XT patch monitor.  Your ZIO patch monitor will be mailed 3 day USPS to your address on file. It may take 3-5 days to receive your monitor after you have been enrolled. Once you have received your monitor, please review the enclosed instructions. Your monitor has already been registered assigning a specific monitor serial number to you.  Billing and Patient Assistance Program Information  We have supplied [employer] with any of your insurance information on file for billing purposes.  [employer] offers a sliding scale Patient Assistance Program for patients that do not have insurance, or whose insurance does not completely cover the cost of the ZIO monitor.  You must apply for the Patient Assistance Program to qualify for this discounted rate.  To apply, please call [employer] at [phone], select option 4, select option 2, ask to apply for Patient Assistance Program. Meredeth will ask your household income, and how many people are in your household. They will quote your out-of-pocket cost based on that information. [employer] will also be able to set up a 38-month, interest-free payment plan if  needed.  Applying the monitor   Shave hair from upper left chest.  Hold abrader disc by orange tab. Rub abrader in 40 strokes over the upper left chest as indicated in your monitor instructions.  Clean area with 4 enclosed alcohol pads. Let dry.  Apply patch as indicated in monitor instructions. Patch will be placed under collarbone on left side of chest with arrow pointing upward.  Rub patch adhesive wings for 2 minutes. Remove white label marked 1. Remove the white label marked 2. Rub patch adhesive wings for 2 additional minutes.  While looking in a mirror, press and release button in center of patch. A small green light will flash 3-4 times. This will be your only indicator that the monitor has been turned on.  Do not shower for the first 24 hours. You may shower after the first 24 hours.  Press the button if you feel a symptom. You will hear a small click. Record Date, Time and Symptom in the Patient Logbook.  When you are ready to remove the patch, follow instructions on the last 2 pages of Patient Logbook.  Stick patch monitor into the tabs at the bottom of the return box.  Place Patient Logbook in the blue and white box. Use locking tab on box and tape box closed securely. The blue and white box has prepaid postage on it. Please place it in the mailbox as soon as possible. Your physician should have your test results approximately 7-14 days after the monitor has been mailed back to [facility].  Call [facility] Customer Care at [phone] if you  have questions regarding your ZIO XT patch monitor.  Call them immediately if you see an orange light blinking on your monitor.  If your monitor falls off in less than 4 days, contact our Monitor department at [phone].  If your monitor becomes loose or falls off after 4 days call [employer] at [phone] for suggestions on securing your monitor.   Follow-Up: At [facility], you and your health needs are our priority.   As part of our continuing mission to provide you with exceptional heart care, our providers are all part of one team.  This team includes your primary Cardiologist (physician) and Advanced Practice Providers or APPs (Physician Assistants and Nurse Practitioners) who all work together to provide you with the care you need, when you need it.  Your next appointment:   6 month(s)  Provider:   You may see Laura Hanson, Laura Ferguson or one of the following Advanced Practice Providers on your designated Care Team:   Laura Meager, Laura Ferguson Laura Maffucci, Laura Ferguson Laura Bring, Laura Ferguson Laura Franchester, Laura Ferguson Laura Lunch, Laura Ferguson Laura Hila, Laura Ferguson     Tips to Measure your Blood Pressure Correctly Dr. Hanson recommends checking your blood pressure once weekly and keeping a log. Please Ferguson log to your next appointment.   Here's what you can do to ensure a correct reading:  Don't drink a caffeinated beverage or smoke during the 30 minutes before the test.  Sit quietly for five minutes before the test begins.  During the measurement, sit in a chair with your feet on the floor and your arm supported so your elbow is at about heart level.  The inflatable part of the cuff should completely cover at least 80% of your upper arm, and the cuff should be placed on bare skin, not over a shirt.  Don't talk during the measurement.  Have your blood pressure measured twice, with a brief break in between. If the readings are different by 5 points or more, have it done a third time.   Blood Pressure Log   Date   Time  Blood Pressure  Position  Example: Nov 1 9 AM 124/78 sitting

## 2023-11-20 NOTE — Progress Notes (Unsigned)
 Cardiology Office Note:  .   Date:  11/21/2023  ID:  Laura Ferguson, DOB December 26, 1932, MRN [id number] PCP: Gretta Comer POUR, NP  [facility] Providers Cardiologist:  Lonni Hanson, MD     History of Present Illness: .   Laura Ferguson is a 89 y.o. female with history of coronary artery disease status post CABG in 2009 (LIMA-LAD, SVG-ramus intermedius, sequential SVG-OM1-OM2, and sequential SVG-acute marginal-distal RCA), hypertension, hyperlipidemia, and type 2 diabetes mellitus, who presents for follow-up of coronary artery disease.  She was previously followed by Dr. Burnard, having last seen him in 05/2023.  No medication changes or additional testing were pursued.  Today, Laura Ferguson reports that she has been feeling fairly well.  Her biggest concern is intermittent dizziness with a sensation of being off balance.  She has not been frankly lightheaded.  She has had vertigo in the past which improved after Epley maneuver was performed by ENT.  However, she thinks that repeat maneuvers are not indicated based on subsequent visits with ENT.  She notes that her PCP is planning to order a brain MRI.  She has not fallen recently.  She denies chest pain and shortness of breath.  She reports intermittent palpitations though it is difficult to determine if they correspond to her aforementioned dizziness..  She has occasional mild lower extremity edema, which is stable.  ROS: See HPI  Studies Reviewed: SABRA   EKG Interpretation Date/Time:  Wednesday November 20 2023 14:18:58 EST Ventricular Rate:  63 PR Interval:  150 QRS Duration:  70 QT Interval:  396 QTC Calculation: 405 R Axis:   5  Text Interpretation: Sinus rhythm with Premature atrial complexes Septal infarct , age undetermined Abnormal ECG When compared with ECG of 22-May-2023 09:58, Premature atrial complexes are now Present Septal infarct is now Present (noted on tracing on 11/06/2022) Confirmed by Adelai Achey, Lonni [phone]) on 11/21/2023  3:48:57 PM    Risk Assessment/Calculations:            Physical Exam:   VS:  BP 136/60 (BP Location: Right Arm, Patient Position: Sitting, Cuff Size: Normal)   Pulse 63 Comment: 60 oximeter  Ht 5' 3 (1.6 m)   Wt 118 lb 3.2 oz (53.6 kg)   SpO2 100%   BMI 20.94 kg/m    Wt Readings from Last 3 Encounters:  11/20/23 118 lb 3.2 oz (53.6 kg)  10/14/23 116 lb (52.6 kg)  09/20/23 116 lb (52.6 kg)    General:  NAD. Neck: No JVD or HJR. Lungs: Clear to auscultation bilaterally without wheezes or crackles. Heart: Regular rate and rhythm without murmurs, rubs, or gallops. Abdomen: Soft, nontender, nondistended. Extremities: No lower extremity edema.  ASSESSMENT AND PLAN: .    Coronary artery disease: No angina reported.  Continue aspirin, ezetimibe , and rosuvastatin  for secondary prevention.  Palpitations: Intermittently present and difficult to determine if associated with aforementioned dizziness.  Given her advanced age and history of CAD, Ms. Arntson is certainly at risk for atrial fibrillation, which would affect our treatment strategy going forward.  We have agreed to obtain a 14-day event monitor for further evaluation.  Hypertension: Blood pressure mildly elevated today.  We have agreed to defer medication changes; continue current regimen of losartan  and metoprolol  succinate.  I have asked Ms. Bryand to monitor her pressures at home and to bring a record with her to our next visit.  If her blood pressure remains above 130/80, additional pharmacotherapy will need to be considered.  I  have encouraged her to minimize her sodium intake.  Hyperlipidemia associated with type 2 diabetes mellitus: LDL well-controlled on last check in 05/2023.  Continue current regimen of ezetimibe  and rosuvastatin .  Ongoing management of DM per Ms. Clark.  Dizziness: Described more as an off-balance sensation rather than lightheadedness.  Similar dizziness resolved in the past with maneuvers performed by ENT  (she reports that repeat maneuvers were not indicated at subsequent follow-up with ENT).  We will obtain an event monitor, as above.  Continue ongoing workup per Ms. Clark.    Dispo: Return to clinic in 6 months.  Signed, Lonni Hanson, MD

## 2023-11-21 ENCOUNTER — Encounter: Payer: Self-pay | Admitting: Internal Medicine

## 2023-11-23 ENCOUNTER — Other Ambulatory Visit: Payer: Self-pay

## 2023-11-23 ENCOUNTER — Encounter: Payer: Self-pay | Admitting: Emergency Medicine

## 2023-11-23 ENCOUNTER — Ambulatory Visit
Admission: EM | Admit: 2023-11-23 | Discharge: 2023-11-23 | Disposition: A | Discharge status: Home / Self Care | Attending: Emergency Medicine | Admitting: Emergency Medicine

## 2023-11-23 DIAGNOSIS — I491 Atrial premature depolarization: Secondary | ICD-10-CM | POA: Diagnosis not present

## 2023-11-23 NOTE — ED Provider Notes (Signed)
 [facility]    CSN: [id number] Arrival date & time: 11/23/23  1106      History   Chief Complaint Chief Complaint  Patient presents with   Dizziness   Headache   Tachycardia    HPI Laura Ferguson is a 89 y.o. female.   Patient presents for evaluation after episode of a racing heart and diaphoresis overnight, has resolved.  Experiencing dizziness for the past 2 to 3 months, has been evaluated by ear nose and throat who did Epley maneuver which provided some relief.  Has also been evaluated by cardiology on 11/20/2023 for same symptoms, Holter monitor for 14 days, just arrived has not been placed.  Denies chest pain tightness, lightheadedness, syncope, visual changes or shortness of breath..  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   LIMA to LAD, SVG to ramus intermedius, SVG to OM1 & OM2, SVG to acute marginal, SVG to distal RCA   Type 2 diabetes mellitus (HCC)     Patient Active Problem List   Diagnosis Date Noted   Vertigo 03/18/2023   Pelvic pain 09/21/2022   Uterine fibroid 07/03/2022   GERD (gastroesophageal reflux disease) 07/03/2022   Myalgia 08/04/2020   Fatigue 03/02/2020   Preventative health care 10/13/2019   Other social stressor 08/31/2019   Type 2 diabetes mellitus with retinopathy (HCC) 01/29/2018   Chronic back pain 11/12/2017   Anxiety and depression 10/16/2017   Dizziness 03/26/2015   CAD (coronary artery disease) 12/29/2012   HTN (hypertension) 12/29/2012   Hyperlipidemia 12/29/2012    Past Surgical History:  Procedure Laterality Date   CARDIAC CATHETERIZATION  05/07/2007    EF 55%, focal mild hypocontractility in mid-distal inferior wall & mid posterolateral wall; severe multivessel CAD - susequent CABGx6 (Dr. IVAR Sor)   CORONARY ARTERY BYPASS GRAFT  05/09/2007   LIMA to LAD, veing to intermediate; SVG to OM1 & OM2; SVG to acute marginal & distal RCA (Dr. Kerrin)   TRANSTHORACIC ECHOCARDIOGRAM  02/13/2010   EF =>55%, vigorous contraction EF 65%; LA mild-mod dilated; IV normal diameter - normal CVP; trace MR; mild TR; trace AV regurg    OB History     Gravida  8   Para  8   Term  8   Preterm      AB      Living  2      SAB      IAB      Ectopic      Multiple      Live Births  8        Obstetric Comments  All vaginal deliveries          Home Medications    Prior to Admission medications   Medication Sig Start Date End Date Taking? Authorizing Provider  Acetaminophen (TYLENOL 8 HOUR PO) Take 1 tablet by mouth every 8 (eight) hours as needed.     [provider]  aspirin 81 MG tablet Take 81 mg by mouth daily.    [provider]  Azelastine HCl 137 MCG/SPRAY SOLN As needed 07/21/19  [provider]  Blood Glucose Monitoring Suppl (ACCU-CHEK GUIDE) w/Device KIT Use as instructed to check blood sugar. 06/06/21   Trixie File, MD  Cholecalciferol (VITAMIN D) 50 MCG (2000 UT) CAPS Take 1 capsule by mouth daily.    [provider]  Continuous Glucose Receiver (FREESTYLE LIBRE 3 READER) DEVI Use to check blood sugar continuously (broken reader, try for lost/damaged med override). E11.65, Z79.4 10/18/23   Wendee Lynwood HERO, NP  Continuous Glucose Sensor (FREESTYLE LIBRE 3 PLUS SENSOR) MISC Change sensor every 15 days. 06/28/23   Clark, Katherine K, NP  Continuous Glucose Sensor (FREESTYLE LIBRE 3 SENSOR) MISC APPLY ONE SENSOR EVERY 14 DAYS TO CONTINOUSLY MONITOR GLUCOSE 10/17/23   Gretta Comer POUR, NP  ezetimibe  (ZETIA ) 10 MG tablet Take 1 tablet (10 mg total) by mouth daily. 11/18/23   Lelon Hamilton T,  PA-C  glucose blood (ACCU-CHEK GUIDE) test strip USE TO CHECK SUGAR THREE TIMES DAILY AS DIRECTED 12/27/20   Trixie File, MD  Insulin  Pen Needle (BD PEN NEEDLE NANO 2ND GEN) 32G X 4 MM MISC USE 1 PEN NEEDLE WITH INSULIN  PEN once daily 10/05/22   Clark, Katherine K, NP  linagliptin  (TRADJENTA ) 5 MG TABS tablet Take 1 tablet (5 mg total) by mouth daily. for diabetes. 08/26/23   Gretta Comer POUR, NP  losartan  (COZAAR ) 100 MG tablet Take 1 tablet (100 mg total) by mouth daily. 04/15/23   Burnard Debby LABOR, MD  metoprolol  succinate (TOPROL -XL) 50 MG 24 hr tablet Take 1 tablet (50 mg total) by mouth daily. Take with or immediately following a meal. 11/18/23   Lelon Hamilton T, PA-C  Multiple Vitamin (MULTIVITAMIN) capsule Take 1 capsule by mouth daily.    [provider]  pantoprazole  (PROTONIX ) 40 MG tablet Take 1 tablet by mouth once daily 07/12/23   Burnard Debby LABOR, MD  Polyethyl Glycol-Propyl Glycol (SYSTANE OP) Apply 2 drops to eye 2 (two) times daily.     [provider]  rosuvastatin  (CRESTOR ) 10 MG tablet Take 1 tablet (10 mg total) by mouth daily. for cholesterol. 04/15/23   Burnard Debby LABOR, MD  TRAVATAN Z 0.004 % SOLN ophthalmic solution Place 1 drop into both eyes at bedtime. 10/14/14   [provider]    Family History Family History  Problem Relation Age of Onset   Cancer Mother    Diabetes Sister    Cancer Child     Social History Social History   Tobacco Use   Smoking status: Never   Smokeless tobacco: Never   Tobacco comments:    quit 1960's  smoked very lightly.  Vaping Use   Vaping status: Never Used  Substance Use Topics   Alcohol use: No    Alcohol/week: 0.0 standard drinks of alcohol   Drug use: No     Allergies   Patient has no known allergies.   Review of Systems Review of Systems  Neurological:  Positive for dizziness and headaches.     Physical Exam Triage Vital Signs ED Triage Vitals  Encounter Vitals Group     BP  11/23/23 1126 (!) 168/74     Girls Systolic BP Percentile --      Girls Diastolic BP Percentile --      Boys Systolic BP Percentile --      Boys Diastolic BP Percentile --      Pulse Rate 11/23/23 1126 74     Resp 11/23/23 1126 20     Temp 11/23/23 1126 98.1 F (36.7  C)     Temp Source 11/23/23 1126 Oral     SpO2 11/23/23 1125 100 %     Weight --      Height --      Head Circumference --      Peak Flow --      Pain Score 11/23/23 1124 0     Pain Loc --      Pain Education --      Exclude from Growth Chart --    No data found.  Updated Vital Signs BP (!) 168/74 (BP Location: Right Arm)   Pulse 74   Temp 98.1 F (36.7 C) (Oral)   Resp 20   SpO2 100%   Visual Acuity Right Eye Distance:   Left Eye Distance:   Bilateral Distance:    Right Eye Near:   Left Eye Near:    Bilateral Near:     Physical Exam Constitutional:      Appearance: Normal appearance.  Eyes:     Extraocular Movements: Extraocular movements intact.  Cardiovascular:     Rate and Rhythm: Normal rate and regular rhythm.     Pulses: Normal pulses.     Heart sounds: Normal heart sounds.  Pulmonary:     Effort: Pulmonary effort is normal.     Breath sounds: Normal breath sounds.  Neurological:     Mental Status: She is alert and oriented to person, place, and time.      UC Treatments / Results  Labs (all labs ordered are listed, but only abnormal results are displayed) Labs Reviewed - No data to display  EKG   Radiology No results found.  Procedures Procedures (including critical care time)  Medications Ordered in UC Medications - No data to display  Initial Impression / Assessment and Plan / UC Course  I have reviewed the triage vital signs and the nursing notes.  Pertinent labs & imaging results that were available during my care of the patient were reviewed by me and considered in my medical decision making (see chart for details).  PAC  Blood pressure 168/74, heart rate 74 in  triage, EKG showing normal sinus rhythm with PAC, no change from EKG completed at cardiology office on 11/20/2023, patient only experiencing dizziness at this time which has been present for several months, stable for outpatient management, will defer ED evaluation at this time, nursing staff walked through family with how to place Holter monitor, advised notification of cardiologist of current event, given strict ER precautions for any new or worsening symptoms Final Clinical Impressions(s) / UC Diagnoses   Final diagnoses:  PAC (premature atrial contraction)     Discharge Instructions      Today you are evaluated for your racing heart on the EKG which shows electrical rhythm and it shows that your heart is having an extra beat but is not occurring sporadically or fast  This is the same rhythm that was seen in your cardiologist office on 11/20/2023  If you have another episode where you feel like your heart is racing and you are diaphoretic please go to the nearest emergency department for a full cardiac workup  We have walked you through the instructions to place your heart monitor on as instructed by your primary doctor  Please notify him of current event .   At any point if you feel like your heart is racing you feel chest pain shortness of breath or vision changes, lightheaded or dizzy please go to the nearest emergency department  for immediate evaluation   ED Prescriptions   None    PDMP not reviewed this encounter.   Teresa Shelba SAUNDERS, NP 11/23/23 1218

## 2023-11-23 NOTE — ED Triage Notes (Signed)
 Patient reports Ha, dizziness, sweating and heart racing that increased last night. Denies CP and SOB at this time.

## 2023-11-23 NOTE — Discharge Instructions (Signed)
 Today you are evaluated for your racing heart on the EKG which shows electrical rhythm and it shows that your heart is having an extra beat but is not occurring sporadically or fast  This is the same rhythm that was seen in your cardiologist office on 11/20/2023  If you have another episode where you feel like your heart is racing and you are diaphoretic please go to the nearest emergency department for a full cardiac workup  We have walked you through the instructions to place your heart monitor on as instructed by your primary doctor  Please notify him of current event .   At any point if you feel like your heart is racing you feel chest pain shortness of breath or vision changes, lightheaded or dizzy please go to the nearest emergency department for immediate evaluation

## 2023-11-25 ENCOUNTER — Other Ambulatory Visit: Payer: Self-pay | Admitting: Primary Care

## 2023-11-25 DIAGNOSIS — K219 Gastro-esophageal reflux disease without esophagitis: Secondary | ICD-10-CM | POA: Diagnosis not present

## 2023-11-25 DIAGNOSIS — H409 Unspecified glaucoma: Secondary | ICD-10-CM | POA: Diagnosis not present

## 2023-11-25 DIAGNOSIS — I499 Cardiac arrhythmia, unspecified: Secondary | ICD-10-CM | POA: Diagnosis not present

## 2023-11-25 DIAGNOSIS — M1990 Unspecified osteoarthritis, unspecified site: Secondary | ICD-10-CM | POA: Diagnosis not present

## 2023-11-25 DIAGNOSIS — F32A Depression, unspecified: Secondary | ICD-10-CM

## 2023-11-25 DIAGNOSIS — Z794 Long term (current) use of insulin: Secondary | ICD-10-CM | POA: Diagnosis not present

## 2023-11-25 DIAGNOSIS — E11311 Type 2 diabetes mellitus with unspecified diabetic retinopathy with macular edema: Secondary | ICD-10-CM | POA: Diagnosis not present

## 2023-11-25 DIAGNOSIS — E11319 Type 2 diabetes mellitus with unspecified diabetic retinopathy without macular edema: Secondary | ICD-10-CM

## 2023-11-25 DIAGNOSIS — E785 Hyperlipidemia, unspecified: Secondary | ICD-10-CM | POA: Diagnosis not present

## 2023-11-25 DIAGNOSIS — M2010 Hallux valgus (acquired), unspecified foot: Secondary | ICD-10-CM | POA: Diagnosis not present

## 2023-11-25 DIAGNOSIS — M2040 Other hammer toe(s) (acquired), unspecified foot: Secondary | ICD-10-CM | POA: Diagnosis not present

## 2023-11-25 DIAGNOSIS — N1832 Chronic kidney disease, stage 3b: Secondary | ICD-10-CM | POA: Diagnosis not present

## 2023-11-25 DIAGNOSIS — G3184 Mild cognitive impairment, so stated: Secondary | ICD-10-CM | POA: Diagnosis not present

## 2023-11-25 DIAGNOSIS — F419 Anxiety disorder, unspecified: Secondary | ICD-10-CM | POA: Diagnosis not present

## 2023-11-25 DIAGNOSIS — E1122 Type 2 diabetes mellitus with diabetic chronic kidney disease: Secondary | ICD-10-CM | POA: Diagnosis not present

## 2023-11-25 DIAGNOSIS — Z809 Family history of malignant neoplasm, unspecified: Secondary | ICD-10-CM | POA: Diagnosis not present

## 2023-11-25 DIAGNOSIS — F322 Major depressive disorder, single episode, severe without psychotic features: Secondary | ICD-10-CM | POA: Diagnosis not present

## 2023-11-25 DIAGNOSIS — Z7982 Long term (current) use of aspirin: Secondary | ICD-10-CM | POA: Diagnosis not present

## 2023-11-25 DIAGNOSIS — Z7984 Long term (current) use of oral hypoglycemic drugs: Secondary | ICD-10-CM | POA: Diagnosis not present

## 2023-11-25 DIAGNOSIS — I129 Hypertensive chronic kidney disease with stage 1 through stage 4 chronic kidney disease, or unspecified chronic kidney disease: Secondary | ICD-10-CM | POA: Diagnosis not present

## 2023-11-25 MED ORDER — LINAGLIPTIN 5 MG PO TABS: 5.0000 mg | ORAL_TABLET | Freq: Every day | ORAL | 0 refills | Status: AC

## 2023-11-25 MED ORDER — SERTRALINE HCL 25 MG PO TABS: 25.0000 mg | ORAL_TABLET | Freq: Every day | ORAL | 0 refills | Status: AC

## 2023-11-25 NOTE — Telephone Encounter (Signed)
 Copied from CRM #[id number]. Topic: Clinical - Medication Refill >> Nov 25, 2023 10:13 AM Aleatha C wrote: Medication: Zoft ,  linagliptin  (TRADJENTA ) 5 MG TABS tablet     Has the patient contacted their pharmacy? No (Agent: If no, request that the patient contact the pharmacy for the refill. If patient does not wish to contact the pharmacy document the reason why and proceed with request.) (Agent: If yes, when and what did the pharmacy advise?)  This is the patient's preferred pharmacy:  [facility] [address], [place] - [address] [place] [address] Phone: [phone] Fax: [phone]  Is this the correct pharmacy for this prescription? Yes If no, delete pharmacy and type the correct one.   Has the prescription been filled recently? No  Is the patient out of the medication? Yes  Has the patient been seen for an appointment in the last year OR does the patient have an upcoming appointment? Yes  Can we respond through MyChart? No  Agent: Please be advised that Rx refills may take up to 3 business days. We ask that you follow-up with your pharmacy.

## 2023-11-25 NOTE — Telephone Encounter (Unsigned)
 Copied from CRM #[id number]. Topic: Clinical - Medication Refill >> Nov 25, 2023 10:13 AM Aleatha C wrote: Medication: Zoft ,  linagliptin  (TRADJENTA ) 5 MG TABS tablet     Has the patient contacted their pharmacy? No (Agent: If no, request that the patient contact the pharmacy for the refill. If patient does not wish to contact the pharmacy document the reason why and proceed with request.) (Agent: If yes, when and what did the pharmacy advise?)  This is the patient's preferred pharmacy:  [facility] [address], [place] - [address] [place] [address] Phone: [phone] Fax: [phone]  Is this the correct pharmacy for this prescription? Yes If no, delete pharmacy and type the correct one.   Has the prescription been filled recently? No  Is the patient out of the medication? Yes  Has the patient been seen for an appointment in the last year OR does the patient have an upcoming appointment? Yes  Can we respond through MyChart? No  Agent: Please be advised that Rx refills may take up to 3 business days. We ask that you follow-up with your pharmacy. >> Nov 25, 2023  1:46 PM Suzen RAMAN wrote: Patient also would like a refill on sertraline  (ZOLOFT ) 25 MG tablet, patient states it was dicontinue but she was advised to restart mediacation at her last appt

## 2023-11-26 DIAGNOSIS — E113313 Type 2 diabetes mellitus with moderate nonproliferative diabetic retinopathy with macular edema, bilateral: Secondary | ICD-10-CM | POA: Diagnosis not present

## 2023-11-26 DIAGNOSIS — H35033 Hypertensive retinopathy, bilateral: Secondary | ICD-10-CM | POA: Diagnosis not present

## 2023-11-26 DIAGNOSIS — Z0279 Encounter for issue of other medical certificate: Secondary | ICD-10-CM

## 2023-11-26 DIAGNOSIS — H35373 Puckering of macula, bilateral: Secondary | ICD-10-CM | POA: Diagnosis not present

## 2023-11-26 DIAGNOSIS — Z961 Presence of intraocular lens: Secondary | ICD-10-CM | POA: Diagnosis not present

## 2023-11-26 DIAGNOSIS — H401132 Primary open-angle glaucoma, bilateral, moderate stage: Secondary | ICD-10-CM | POA: Diagnosis not present

## 2023-11-26 DIAGNOSIS — H43813 Vitreous degeneration, bilateral: Secondary | ICD-10-CM | POA: Diagnosis not present

## 2023-11-26 NOTE — Telephone Encounter (Signed)
 Completed forms and placed on Erin's desk.

## 2023-11-26 NOTE — Telephone Encounter (Signed)
 Received FMLA forms for patients daughter for extension.  Forms placed in providers box for review.

## 2023-11-26 NOTE — Telephone Encounter (Signed)
 Completed paperwork received and faxed to [employer] at [phone]  Copy sent to scan  Pt notified via voice mail that a copy was at the front desk to pick up for their records.

## 2023-11-28 ENCOUNTER — Other Ambulatory Visit: Payer: Self-pay

## 2023-11-28 ENCOUNTER — Ambulatory Visit: Payer: Self-pay | Admitting: Primary Care

## 2023-11-28 ENCOUNTER — Encounter: Payer: Self-pay | Admitting: Primary Care

## 2023-11-28 ENCOUNTER — Ambulatory Visit: Admitting: Primary Care

## 2023-11-28 VITALS — BP 138/64 | HR 63 | Temp 98.0°F | Ht 63.0 in | Wt 120.5 lb

## 2023-11-28 DIAGNOSIS — Z23 Encounter for immunization: Secondary | ICD-10-CM | POA: Diagnosis not present

## 2023-11-28 DIAGNOSIS — Z794 Long term (current) use of insulin: Secondary | ICD-10-CM | POA: Diagnosis not present

## 2023-11-28 DIAGNOSIS — R2689 Other abnormalities of gait and mobility: Secondary | ICD-10-CM | POA: Insufficient documentation

## 2023-11-28 DIAGNOSIS — E11319 Type 2 diabetes mellitus with unspecified diabetic retinopathy without macular edema: Secondary | ICD-10-CM

## 2023-11-28 LAB — POCT GLYCOSYLATED HEMOGLOBIN (HGB A1C): Hemoglobin A1C: 8.6 % — AB (ref 4.0–5.6)

## 2023-11-28 NOTE — Assessment & Plan Note (Signed)
 Improved but not at goal with A1C of 8.6.  Fortunately, she is experiencing significantly less drops in glucose.  Continue Lantus  30 units daily and Tradjenta  5 mg daily.  We will plan to see her back in 3 months for follow up. In the meantime, she will monitor glucose readings. As long as they continue to improve we will make no changes.

## 2023-11-28 NOTE — Patient Outreach (Signed)
 Complex Care Management   Visit Note  11/28/2023  Name:  Laura Ferguson MRN: [id number] DOB: 27-Feb-1932  Situation: Referral received for Complex Care Management related to Diabetes with Complications and Depression I obtained verbal consent from Patient.  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   LIMA to LAD, SVG to ramus intermedius, SVG to OM1 & OM2, SVG to acute marginal, SVG to distal RCA   Type 2 diabetes mellitus (HCC)     Assessment: Patient Reported Symptoms:  Cognitive Cognitive Status: Alert and oriented to person, place, and time, Normal speech and language skills Cognitive/Intellectual Conditions Management [RPT]: None reported or documented in medical history or problem list   Health Maintenance Behaviors: Annual physical exam  Neurological Neurological Review of Symptoms: No symptoms reported    HEENT HEENT Symptoms Reported: Not assessed      Cardiovascular Cardiovascular Symptoms Reported: No symptoms reported Cardiovascular Management Strategies: Medication therapy, Routine screening, Medical device Cardiovascular Comment: patient went to [facility] 11/8 for dizziness and racing heart - now with holter monitor, no further episodes - aware sx requiring call to provider and red flags for ED  Respiratory Respiratory Symptoms Reported: No symptoms reported Respiratory Management Strategies: Routine screening  Endocrine Endocrine Symptoms Reported: Hypoglycemia Is patient diabetic?: Yes Is patient checking blood sugars at home?: Yes List most recent  blood sugar readings, include date and time of day: FBG today 69 - treatment per protocol, reports has snacks at night,   reports lows in mornings 59-69, can feel when low, just ate lunch and BG now 269, just took insulin , RNCM notiied PCP patient reporting lows - has appointment this afternoon, Unclear if still working with [facility] for DM    Gastrointestinal Gastrointestinal Symptoms Reported: Not assessed      Genitourinary Genitourinary Symptoms Reported: No symptoms reported    Integumentary Integumentary Symptoms Reported: No symptoms reported    Musculoskeletal Musculoskelatal Symptoms Reviewed: Back pain Additional Musculoskeletal Details: some back pain - tylenol prn Musculoskeletal Management Strategies: Medication therapy, Routine screening Falls in the past year?: No Number of falls in past year: 1 or less Was there an injury with Fall?: No Fall Risk Category Calculator: 0 Patient Fall Risk Level: Low Fall Risk Patient at Risk for Falls Due to: History of fall(s), Orthopedic patient Fall risk Follow up: Falls evaluation completed, Falls prevention discussed  Psychosocial Additional Psychological Details: reports taking depression med regularly and feels good, social activities, church, gardening, sees LCSW for counseling Behavioral Management Strategies: Coping strategies, Counseling Major Change/Loss/Stressor/Fears (CP): Medical condition, self Techniques to Cope with Loss/Stress/Change: Diversional activities, Spiritual practice(s)      11/28/2023    PHQ2-9 Depression Screening   Little interest or pleasure in doing things Not at all  Feeling down, depressed, or hopeless Not at all  PHQ-2 - Total Score 0  Trouble falling or staying asleep, or sleeping too much    Feeling tired or having little energy    Poor appetite or overeating     Feeling bad about  yourself - or that you are a failure or have let yourself or your family down    Trouble concentrating on things,  such as reading the newspaper or watching television    Moving or speaking so slowly that other people could have noticed.  Or the opposite - being so fidgety or restless that you have been moving around a lot more than usual    Thoughts that you would be better off dead, or hurting yourself in some way    PHQ2-9 Total Score    If you checked off any problems, how difficult have these problems made it for you to do your work, take care of things at home, or get along with other people    Depression Interventions/Treatment      There were no vitals filed for this visit.    Medications Reviewed Today   Medications were not reviewed in this encounter     Recommendation:   PCP Follow-up Continue Current Plan of Care  Follow Up Plan:   Telephone follow-up in 1 month  Nestora Duos, MSN, RN [facility]  [facility], [facility] RN Care Manager Direct Dial: [phone] Fax: [phone]

## 2023-11-28 NOTE — Patient Instructions (Signed)
 Continue Lantus  30 units daily and Tradjenta  5 mg daily for blood sugars.  Please schedule a follow up visit for 3 months.  It was a pleasure to see you today!

## 2023-11-28 NOTE — Patient Instructions (Signed)
 Visit Information  Thank you for taking time to visit with me today. Please don't hesitate to contact me if I can be of assistance to you before our next scheduled appointment.  Your next care management appointment is by telephone on 12/26/2023 at 1:00 pm  Telephone follow-up in 1 month  Please call the care guide team at [phone] if you need to cancel, schedule, or reschedule an appointment.   Please call the Suicide and Crisis Lifeline: 988 call the [place]  National Suicide Prevention Lifeline: 367 009 1246 or TTY: 807-886-8302 TTY 860-841-5924) to talk to a trained counselor call 1-800-273-TALK (toll free, 24 hour hotline) go to [facility] [address] [place] [phone]) call 911 if you are experiencing a Mental Health or Behavioral Health Crisis or need someone to talk to.  Nestora Duos, MSN, RN [place]  [facility], [facility] RN Care Manager Direct Dial: [phone] Fax: [phone]   Preventing Hypoglycemia Hypoglycemia is when the amount of sugar, or glucose, in your blood is too low. Low blood sugar can happen if you have diabetes or if you don't have diabetes. It may be an emergency. Work with your health care provider to make and change your meal plan as needed. This can help prevent low blood sugar. What can increase my risk? You may be more likely to get low blood sugar if: You take insulin  or other diabetes medicines. You skip or delay a meal or snack. You get sick. How can low blood sugar affect me? Mild symptoms Mild cases may not cause symptoms. If you do have symptoms, they may include: Hunger or feeling like you may vomit. Sweating and feeling cold to the touch. Feeling dizzy or light-headed. Being sleepy or having trouble sleeping. A fast heart rate. A headache. Blurry eyesight. Mood changes. These include feeling worried, nervous, or easily annoyed. Tingling or numbness  around your mouth, lips, or tongue. If a mild case of low blood sugar isn't treated, it can become moderate or severe. Moderate symptoms If you have a moderate case, you may: Feel confused. Have changes in the way you act or move. Feel weak. Have an uneven heartbeat. Severe symptoms Having very low blood sugar is an emergency. It can cause: Fainting. Seizures. A coma. Death. What nutrition changes can I make? Work with your provider or an expert in healthy eating called a dietitian to make a meal plan. Eat meals at set times. Have snacks between meals, as told by your provider. Donot skip or delay meals or snacks. What other actions can I take to prevent low blood sugar?  Work closely with your provider to manage your blood sugar. Make sure you know: What your blood sugar should be. How and when to check your blood sugar. The symptoms of low blood sugar. Be sure to eat food when you drink alcohol. When you're sick, check your blood sugar more often. Make a sick day plan in advance with your provider. Follow this plan when you can't eat or drink like normal. Always check your blood sugar before, during, and after exercise. How is this treated? Treating low blood sugar If you have low blood sugar, eat or drink something with sugar in it right away. The food or drink should have 15 grams of a fast-acting carbohydrate (carb). Options include: 4 oz (120 mL) of fruit juice. 4 oz (120 mL) of soda (not diet soda). A few pieces of hard candy. Check food labels to see how many pieces  to eat. 1 Tbsp (15 mL) of sugar or honey. 4 glucose tablets. 1 tube of glucose gel. Treating low blood sugar if you have diabetes If you're alert and can swallow safely, follow the 15:15 rule: Take 15 grams of a fast-acting carb. Talk with your provider about how much carb you should take. Check your blood sugar 15 minutes after you take the carb. If your blood sugar is still at or below 70 mg/dL (3.9  mmol/L), take 15 grams of a carb again. If your blood sugar doesn't go above 70 mg/dL (3.9 mmol/L) after 3 tries, get help right away. After your blood sugar goes back to normal, eat a meal or a snack within 1 hour. Treating very low blood sugar If your blood sugar is less than 54 mg/dL (3 mmol/L), it's an emergency. Get help right away. If you can't eat or drink, you will need to be given glucagon. A family member or friend should learn how to check your blood sugar and give you glucagon. Ask your provider if you should keep a glucagon kit at home. You may also need to be treated in a hospital. Where to find more information American Diabetes Association (ADA): diabetes.[employer] of Diabetes and Digestive and Kidney Diseases (NIDDK): stagesync.si Association of Diabetes Care & Education Specialists: diabeteseducator.org Contact a health care provider if: You have diabetes and are having trouble keeping your blood sugar in the right range. You have low blood sugar often. Get help right away if: You can't get your blood sugar above 70 mg/dL (3.9 mmol/L) after 3 tries. Your blood sugar is below 54 mg/dL (3 mmol/L). You faint. You have a seizure. These symptoms may be an emergency. Call 911 right away. Do not wait to see if the symptoms will go away. Do not drive yourself to the hospital. This information is not intended to replace advice given to you by your health care provider. Make sure you discuss any questions you have with your health care provider. Document Revised: 03/22/2022 Document Reviewed: 03/22/2022 Elsevier Patient Education  2024 Elsevier Inc.   Hypoglycemia Hypoglycemia is when the amount of sugar, or glucose, in your blood is too low. Low blood sugar can happen if you have diabetes or if you don't have diabetes. It may be an emergency. What are the causes? Low blood sugar happens most often in people who have diabetes. It may be caused by: Diabetes  medicine. Not eating enough, or not eating often enough. Being more active than normal. If you don't have diabetes, you may still get low blood sugar if: There's a tumor in your pancreas. A tumor is a growth of cells that isn't normal. You don't eat enough, or you fast. Fasting is when you don't eat for long periods at a time. You have a bad infection or illness. You have problems after weight loss surgery. You have kidney or liver problems. You take certain medicines. What increases the risk? You're more likely to have low blood sugar if: You have diabetes and take medicine for it. You drink a lot of alcohol. You get sick. What are the signs or symptoms? Mild Hunger or feeling like you may vomit. Sweating and feeling cold to the touch. Feeling dizzy or light-headed. Being sleepy or having trouble sleeping. A headache. Blurry vision. Mood changes. These include feeling worried, nervous, or easily annoyed. Moderate Feeling confused. Changes in the way you act. Weakness. An uneven heartbeat. Very bad Having very low blood sugar is an  emergency. It can cause: Fainting. Seizures. A coma. Death. How is this diagnosed?  Low blood sugar can be found with a blood test. This test tells you how much sugar is in your blood. It's done while you're having symptoms. Your health care provider may also do an exam and look at your medical history. How is this treated? Treating low blood sugar If you have low blood sugar, eat or drink something with sugar in it right away. The food or drink should have 15 grams of a fast-acting carbohydrate (carb). Options include: 4 oz (120 mL) of fruit juice. 4 oz (120 mL) of soda (not diet soda). A few pieces of hard candy. Check food labels to see how many pieces to eat. 1 Tbsp (15 mL) of sugar or honey. 4 glucose tablets. 1 tube of glucose gel. Treating low blood sugar if you have diabetes Talk with your provider about how much carb you should  take. If you're alert and can swallow safely, you may follow the 15:15 rule: Take 15 grams of a fast-acting carb. Check your blood sugar 15 minutes after you take the carb. If your blood sugar is still at or below 70 mg/dL (3.9 mmol/L), take 15 grams of a carb again. If your blood sugar doesn't go above 70 mg/dL (3.9 mmol/L) after 3 tries, get help right away. After your blood sugar goes back to normal, eat a meal or a snack within 1 hour. Always keep 15 grams of a fast-acting carb with you. This could be: 4 glucose tablets. A few pieces of hard candy. 1 Tbsp (15 mL) of honey or sugar. 1 tube of glucose gel. Treating very low blood sugar If your blood sugar is less than 54 mg/dL (3 mmol/L), it's an emergency. Get help right away. If you can't eat or drink, you will need to be given glucagon. A family member or friend should learn how to check your blood sugar and give you glucagon. Ask your provider if you should keep a glucagon kit at home. You may also need to be treated in a hospital. Follow these instructions at home: If you have diabetes: Always keep a fast-acting carb (15 grams) with you. Follow your diabetes care plan. Make sure you: Know the symptoms of low blood sugar. Check your blood sugar as often as told. Always check it before and after you exercise. Always check your blood sugar before you drive. Take your medicines as told. Eat on time. Do not skip meals. Share your diabetes care plan with: Your work or school. The people you live with. Wear an alert bracelet or carry a card that says you have diabetes. General instructions If you drink alcohol: Limit how much you have to: 0-1 drink a day if you're female. 0-2 drinks a day if you're female. Know how much alcohol is in your drink. In the [place] one drink is one 12 oz bottle of beer (355 mL), one 5 oz glass of wine (148 mL), or one 1 oz glass of hard liquor (44 mL). Be sure to eat food when you drink alcohol. Be sure to  check your blood sugar after you drink. Alcohol may lead to low blood sugar later. Where to find more information American Diabetes Association (ADA): diabetes.org Contact a health care provider if: You have low blood sugar often. You have diabetes and are having trouble keeping your blood sugar in the right range. Get help right away if: You can't get your blood sugar above 70  mg/dL (3.9 mmol/L) after 3 tries. Your blood sugar is below 54 mg/dL (3 mmol/L). You have a seizure. You faint. These symptoms may be an emergency. Call 911 right away. Do not wait to see if the symptoms will go away. Do not drive yourself to the hospital. This information is not intended to replace advice given to you by your health care provider. Make sure you discuss any questions you have with your health care provider. Document Revised: 10/04/2022 Document Reviewed: 03/21/2022 Elsevier Patient Education  2024 [employer].

## 2023-11-28 NOTE — Progress Notes (Signed)
 Subjective:    Patient ID: Laura Ferguson, female    DOB: [birth year], 89 y.o.   MRN: [id number]  Laura Ferguson is a very pleasant 89 y.o. female with a history of CAD, hypertension, type 2 diabetes, hyperlipidemia, anxiety depression who presents today for follow-up diabetes.  Her daughter joins us  today.  1) Type 2 Diabetes:  Current medications include: Tradjenta  5 mg daily, Lantus  30 units daily  She is checking her blood glucose continuously with freestyle libre 3+ sensors.    Time in range over last 7 days: 47% Time in range over last 14 days: 40% Time in range over last 30 days: 38%  Her glucose levels are spiking   Infrequent hypoglycemia episodes.   Last A1C: 9.4 in July 2025, 8.6 today Last Eye Exam: Up-to-date Last Foot Exam: Up-to-date Pneumonia Vaccination: 2022 Urine Microalbumin: Up-to-date Statin: Rosuvastatin   Dietary changes since last visit: Her daughters help her with meals, eating salads, veggies.    Exercise: Walking, active outside.   BP Readings from Last 3 Encounters:  11/28/23 138/64  11/23/23 (!) 168/74  11/20/23 136/60   Wt Readings from Last 3 Encounters:  11/28/23 120 lb 8 oz (54.7 kg)  11/20/23 118 lb 3.2 oz (53.6 kg)  10/14/23 116 lb (52.6 kg)   Her daughter is concerned about her overall balance. No recent falls. She does not use a cane or walker. She is not concerned about her balance.    Review of Systems  Respiratory:  Negative for shortness of breath.   Cardiovascular:  Negative for chest pain.  Neurological:  Negative for dizziness and numbness.         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  Vaping Use   Vaping status: Never Used  Substance and Sexual Activity   Alcohol use: No    Alcohol/week: 0.0 standard drinks of alcohol   Drug use: No   Sexual activity: Not on file  Other Topics Concern   Not on file  Social History Narrative   Married.   Retired.   Works as a Engineer, Structural.    Enjoys helping other.    Social Drivers of Corporate Investment Banker Strain: Low Risk  (01/22/2023)   Overall Financial Resource Strain (CARDIA)    Difficulty of Paying Living Expenses: Not hard at all  Food Insecurity: No Food Insecurity (10/03/2023)   Hunger Vital Sign    Worried About Running Out of Food  in the Last Year: Never true    Ran Out of Food in the Last Year: Never true  Transportation Needs: No Transportation Needs (10/03/2023)   PRAPARE - Administrator, Civil Service (Medical): No    Lack of Transportation (Non-Medical): No  Physical Activity: Inactive (01/22/2023)   Exercise Vital Sign    Days of Exercise per Week: 0 days    Minutes of Exercise per Session: 0 min  Stress: No Stress Concern Present (01/22/2023)   [employer] of Occupational Health - Occupational Stress Questionnaire    Feeling of Stress : Only a little  Social Connections: Moderately Isolated (01/22/2023)   Social Connection and Isolation Panel    Frequency of Communication with Friends and Family: More than three times a week    Frequency of Social Gatherings with  Friends and Family: Once a week    Attends Religious Services: More than 4 times per year    Active Member of [employer] or Organizations: No    Attends Banker Meetings: Never    Marital Status: Widowed  Intimate Partner Violence: Not At Risk (10/03/2023)   Humiliation, Afraid, Rape, and Kick questionnaire    Fear of Current or Ex-Partner: No    Emotionally Abused: No    Physically Abused: No    Sexually Abused: No    Past Surgical History:  Procedure Laterality Date   CARDIAC CATHETERIZATION  05/07/2007   EF 55%, focal mild hypocontractility in mid-distal inferior wall & mid posterolateral wall; severe multivessel CAD - susequent CABGx6 (Dr. IVAR Sor)   CORONARY ARTERY BYPASS GRAFT  05/09/2007   LIMA to LAD, veing to intermediate; SVG to OM1 & OM2; SVG to acute marginal & distal RCA (Dr. Kerrin)   TRANSTHORACIC ECHOCARDIOGRAM  02/13/2010   EF =>55%, vigorous contraction EF 65%; LA mild-mod dilated; IV normal diameter - normal CVP; trace MR; mild TR; trace AV regurg    Family History  Problem Relation Age of Onset   Cancer Mother    Diabetes Sister    Cancer Child     No Known Allergies  Current Outpatient Medications on File Prior to Visit  Medication Sig Dispense Refill   Acetaminophen (TYLENOL 8 HOUR PO) Take 1 tablet by mouth every 8 (eight) hours as needed.      aspirin 81 MG tablet Take 81 mg by mouth daily.     Azelastine HCl 137 MCG/SPRAY SOLN As needed     Blood Glucose Monitoring Suppl (ACCU-CHEK GUIDE) w/Device KIT Use as instructed to check blood sugar. 1 kit 0   Cholecalciferol (VITAMIN D) 50 MCG (2000 UT) CAPS Take 1 capsule by mouth daily.     Continuous Glucose Receiver (FREESTYLE LIBRE 3 READER) DEVI Use to check blood sugar continuously (broken reader, try for lost/damaged med override). E11.65, Z79.4 1 each 0   Continuous Glucose Sensor (FREESTYLE LIBRE 3 PLUS SENSOR) MISC Change sensor every 15 days. 6 each 3   Continuous Glucose Sensor  (FREESTYLE LIBRE 3 SENSOR) MISC APPLY ONE SENSOR EVERY 14 DAYS TO CONTINOUSLY MONITOR GLUCOSE 6 each 0   ezetimibe  (ZETIA ) 10 MG tablet Take 1 tablet (10 mg total) by mouth daily. 90 tablet 1   glucose blood (ACCU-CHEK GUIDE) test strip USE TO CHECK SUGAR THREE TIMES DAILY AS DIRECTED 250 each 3   Insulin  Pen Needle (BD PEN NEEDLE NANO 2ND GEN) 32G X 4 MM MISC USE 1 PEN NEEDLE WITH INSULIN  PEN once daily 100 each 3   LANTUS   SOLOSTAR 100 UNIT/ML Solostar Pen Inject 30 Units into the skin daily.     linagliptin  (TRADJENTA ) 5 MG TABS tablet Take 1 tablet (5 mg total) by mouth daily. for diabetes. 90 tablet 0   losartan  (COZAAR ) 100 MG tablet Take 1 tablet (100 mg total) by mouth daily. 90 tablet 1   metoprolol  succinate (TOPROL -XL) 50 MG 24 hr tablet Take 1 tablet (50 mg total) by mouth daily. Take with or immediately following a meal. 180 tablet 1   Multiple Vitamin (MULTIVITAMIN) capsule Take 1 capsule by mouth daily.     pantoprazole  (PROTONIX ) 40 MG tablet Take 1 tablet by mouth once daily 90 tablet 3   Polyethyl Glycol-Propyl Glycol (SYSTANE OP) Apply 2 drops to eye 2 (two) times daily.      rosuvastatin  (CRESTOR ) 10 MG tablet Take 1 tablet (10 mg total) by mouth daily. for cholesterol. 90 tablet 1   sertraline  (ZOLOFT ) 25 MG tablet Take 1 tablet (25 mg total) by mouth daily. for anxiety and depression. 90 tablet 0   TRAVATAN Z 0.004 % SOLN ophthalmic solution Place 1 drop into both eyes at bedtime.     No current facility-administered medications on file prior to visit.    BP 138/64   Pulse 63   Temp 98 F (36.7 C) (Oral)   Ht 5' 3 (1.6 m)   Wt 120 lb 8 oz (54.7 kg)   SpO2 98%   BMI 21.35 kg/m  Objective:   Physical Exam Cardiovascular:     Rate and Rhythm: Normal rate and regular rhythm.  Pulmonary:     Effort: Pulmonary effort is normal.     Breath sounds: Normal breath sounds.  Musculoskeletal:     Cervical back: Neck supple.  Skin:    General: Skin is warm and dry.   Neurological:     Mental Status: She is alert and oriented to person, place, and time.  Psychiatric:        Mood and Affect: Mood normal.     Physical Exam        Assessment & Plan:  Type 2 diabetes mellitus with retinopathy, with long-term current use of insulin , macular edema presence unspecified, unspecified laterality, unspecified retinopathy severity (HCC) Assessment & Plan: Improved but not at goal with A1C of 8.6.  Fortunately, she is experiencing significantly less drops in glucose.  Continue Lantus  30 units daily and Tradjenta  5 mg daily.  We will plan to see her back in 3 months for follow up. In the meantime, she will monitor glucose readings. As long as they continue to improve we will make no changes.     Orders: -     POCT glycosylated hemoglobin (Hb A1C)  Need for influenza vaccination -     Flu vaccine HIGH DOSE PF(Fluzone Trivalent)  Imbalance Assessment & Plan: Gait overall study in the office today.  DME order placed for cane.  Orders: -     For home use only DME Cane    Assessment and Plan Assessment & Plan         Comer MARLA Gaskins, NP     History of Present Illness

## 2023-11-28 NOTE — Assessment & Plan Note (Signed)
 Gait overall study in the office today.  DME order placed for cane.

## 2023-12-03 ENCOUNTER — Telehealth: Payer: Self-pay | Admitting: Pharmacist

## 2023-12-03 NOTE — Telephone Encounter (Signed)
 Copied from CRM #[id number]. Topic: General - Other >> Dec 03, 2023  3:11 PM Mercedes MATSU wrote: Reason for CRM: Patient had a missed call from the pharmacist, she is requesting a call back and can be reached at [phone].

## 2023-12-03 NOTE — Progress Notes (Signed)
 Brief Telephone Documentation Reason for Call: Patient left message  Summary of Call: Patient called back stating she only picked up insulin  at the pharmacy recently. She has not picked up sensors recently (notable, previous voicemail was concerning Tradjenta , not sensors)  Last [place] prescription picked up in October for 3 month supply. Has refills remaining, likely not due until 2026.   Attempted to call patient to clarify.    Manuelita FABIENE Kobs, PharmD Clinical Pharmacist [facility] [phone]

## 2023-12-03 NOTE — Progress Notes (Signed)
 Brief Telephone Documentation Reason for Call: Patient left message regarding question for pharmacist about Tradjenta  refills  Summary of Call: Patient states she is completely out of Tradjenta .  90 day refill has been sent in as of 11/25/23 x90 days.   [facility] states this medication was picked up on 11/26/23.   Left patient message stating refill was filled on 11/11.  Provided direct callback number if questions or issues with this refill.   Laura Ferguson, PharmD Clinical Pharmacist [facility] [phone]

## 2023-12-04 ENCOUNTER — Other Ambulatory Visit: Payer: Self-pay | Admitting: Primary Care

## 2023-12-04 ENCOUNTER — Telehealth: Payer: Self-pay | Admitting: Pharmacist

## 2023-12-04 DIAGNOSIS — E11319 Type 2 diabetes mellitus with unspecified diabetic retinopathy without macular edema: Secondary | ICD-10-CM

## 2023-12-04 NOTE — Progress Notes (Addendum)
 Brief Telephone Documentation Reason for Call: Patient left message regarding question for pharmacist   Summary of Call: Patient notes she is out of [place] sensors.  Last we spoke, we had no sensor and had gotten her a refill to [facility] which she did pick up. [facility] states this was a 90 day supply though patient denies having any more sensors left.   Patient also notes she is open [facility] if [facility]. Was not interested in pill packs through her current [employer] as they were asking too many questions.  [facility] confirms 6 sensors were obtained in October (44-month supply).     Follow Up: Called daughter, Laura Ferguson, on [place] per patient request. Left her a voicemail with my direct callback number.   Laura Ferguson, PharmD Clinical Pharmacist [facility] [phone]  11/19 - Addendum: Daughter, Laura Ferguson, returned call.  She reports she would like to speak with her mother and help look for the sensors and will call back with updates.

## 2023-12-04 NOTE — Telephone Encounter (Signed)
 Noted (see other message by clicking blue '[phone] [place]' link).

## 2023-12-04 NOTE — Telephone Encounter (Unsigned)
 Copied from CRM [id number]. Topic: General - Other >> Dec 04, 2023  9:50 AM Aleatha BROCKS wrote: Reason for CRM:   Patient had a missed call from the pharmacist, she is requesting a call back and can be reached at [phone].

## 2023-12-16 ENCOUNTER — Ambulatory Visit: Payer: Self-pay | Admitting: Internal Medicine

## 2023-12-16 DIAGNOSIS — R002 Palpitations: Secondary | ICD-10-CM

## 2023-12-17 ENCOUNTER — Encounter: Payer: Self-pay | Admitting: Pharmacist

## 2023-12-17 NOTE — Progress Notes (Signed)
 Pharmacy Quality Measure Review  This patient is appearing on a report for being at risk of failing the adherence measure for diabetes medications this calendar year.   Medication: Tradjenta  Last fill date: 08/27/23 for 90 day supply  Insurance report was not up to date. No action needed at this time.  Medication has been refilled as of 11/25/23 x90 ds. Pick up confirmed.

## 2023-12-23 ENCOUNTER — Ambulatory Visit (INDEPENDENT_AMBULATORY_CARE_PROVIDER_SITE_OTHER): Admitting: Podiatry

## 2023-12-23 DIAGNOSIS — Z91198 Patient's noncompliance with other medical treatment and regimen for other reason: Secondary | ICD-10-CM

## 2023-12-23 NOTE — Progress Notes (Signed)
 1. Failure to attend appointment with reason given    Appointment canceled by patient.

## 2023-12-26 ENCOUNTER — Telehealth: Payer: Self-pay

## 2023-12-26 DIAGNOSIS — G3184 Mild cognitive impairment, so stated: Secondary | ICD-10-CM | POA: Diagnosis not present

## 2023-12-26 NOTE — Patient Instructions (Signed)
 Laura Ferguson - I am sorry I was unable to reach you today for our scheduled appointment. I work with Laura Comer POUR, NP and am calling to support your healthcare needs. Please contact me at [phone] at your earliest convenience. I look forward to speaking with you soon.   Thank you,  Nestora Duos, MSN, RN [facility]  [facility] RN Care Manager Direct Dial: [phone] Fax: [phone]

## 2023-12-30 ENCOUNTER — Other Ambulatory Visit: Payer: Self-pay | Admitting: Neurology

## 2023-12-30 DIAGNOSIS — G3184 Mild cognitive impairment, so stated: Secondary | ICD-10-CM

## 2023-12-30 DIAGNOSIS — R2689 Other abnormalities of gait and mobility: Secondary | ICD-10-CM

## 2023-12-31 ENCOUNTER — Inpatient Hospital Stay
Admission: EM | Admit: 2023-12-31 | Discharge: 2024-01-06 | DRG: 956 | Disposition: A | Discharge status: Home Health | Attending: Internal Medicine | Admitting: Internal Medicine

## 2023-12-31 ENCOUNTER — Emergency Department

## 2023-12-31 ENCOUNTER — Encounter: Payer: Self-pay | Admitting: Emergency Medicine

## 2023-12-31 ENCOUNTER — Other Ambulatory Visit: Payer: Self-pay

## 2023-12-31 DIAGNOSIS — Z833 Family history of diabetes mellitus: Secondary | ICD-10-CM

## 2023-12-31 DIAGNOSIS — Z794 Long term (current) use of insulin: Secondary | ICD-10-CM | POA: Diagnosis not present

## 2023-12-31 DIAGNOSIS — E1122 Type 2 diabetes mellitus with diabetic chronic kidney disease: Secondary | ICD-10-CM | POA: Diagnosis present

## 2023-12-31 DIAGNOSIS — N189 Chronic kidney disease, unspecified: Secondary | ICD-10-CM | POA: Diagnosis not present

## 2023-12-31 DIAGNOSIS — R5084 Febrile nonhemolytic transfusion reaction: Secondary | ICD-10-CM | POA: Diagnosis not present

## 2023-12-31 DIAGNOSIS — F419 Anxiety disorder, unspecified: Secondary | ICD-10-CM | POA: Diagnosis present

## 2023-12-31 DIAGNOSIS — S72001D Fracture of unspecified part of neck of right femur, subsequent encounter for closed fracture with routine healing: Secondary | ICD-10-CM | POA: Diagnosis not present

## 2023-12-31 DIAGNOSIS — S329XXA Fracture of unspecified parts of lumbosacral spine and pelvis, initial encounter for closed fracture: Secondary | ICD-10-CM | POA: Insufficient documentation

## 2023-12-31 DIAGNOSIS — E785 Hyperlipidemia, unspecified: Secondary | ICD-10-CM | POA: Diagnosis present

## 2023-12-31 DIAGNOSIS — S32592A Other specified fracture of left pubis, initial encounter for closed fracture: Secondary | ICD-10-CM | POA: Diagnosis present

## 2023-12-31 DIAGNOSIS — S72001A Fracture of unspecified part of neck of right femur, initial encounter for closed fracture: Secondary | ICD-10-CM | POA: Diagnosis not present

## 2023-12-31 DIAGNOSIS — H409 Unspecified glaucoma: Secondary | ICD-10-CM | POA: Diagnosis present

## 2023-12-31 DIAGNOSIS — K219 Gastro-esophageal reflux disease without esophagitis: Secondary | ICD-10-CM | POA: Diagnosis present

## 2023-12-31 DIAGNOSIS — Y92009 Unspecified place in unspecified non-institutional (private) residence as the place of occurrence of the external cause: Secondary | ICD-10-CM | POA: Diagnosis not present

## 2023-12-31 DIAGNOSIS — R5082 Postprocedural fever: Secondary | ICD-10-CM | POA: Diagnosis not present

## 2023-12-31 DIAGNOSIS — N1832 Chronic kidney disease, stage 3b: Secondary | ICD-10-CM | POA: Diagnosis present

## 2023-12-31 DIAGNOSIS — M25551 Pain in right hip: Secondary | ICD-10-CM | POA: Diagnosis present

## 2023-12-31 DIAGNOSIS — E1165 Type 2 diabetes mellitus with hyperglycemia: Secondary | ICD-10-CM | POA: Diagnosis present

## 2023-12-31 DIAGNOSIS — R509 Fever, unspecified: Secondary | ICD-10-CM | POA: Insufficient documentation

## 2023-12-31 DIAGNOSIS — Z7984 Long term (current) use of oral hypoglycemic drugs: Secondary | ICD-10-CM

## 2023-12-31 DIAGNOSIS — Z7982 Long term (current) use of aspirin: Secondary | ICD-10-CM | POA: Diagnosis not present

## 2023-12-31 DIAGNOSIS — S72141A Displaced intertrochanteric fracture of right femur, initial encounter for closed fracture: Secondary | ICD-10-CM | POA: Diagnosis present

## 2023-12-31 DIAGNOSIS — I129 Hypertensive chronic kidney disease with stage 1 through stage 4 chronic kidney disease, or unspecified chronic kidney disease: Secondary | ICD-10-CM | POA: Diagnosis present

## 2023-12-31 DIAGNOSIS — D62 Acute posthemorrhagic anemia: Secondary | ICD-10-CM | POA: Diagnosis not present

## 2023-12-31 DIAGNOSIS — R918 Other nonspecific abnormal finding of lung field: Secondary | ICD-10-CM | POA: Diagnosis not present

## 2023-12-31 DIAGNOSIS — F32A Depression, unspecified: Secondary | ICD-10-CM | POA: Diagnosis present

## 2023-12-31 DIAGNOSIS — N179 Acute kidney failure, unspecified: Secondary | ICD-10-CM | POA: Diagnosis not present

## 2023-12-31 DIAGNOSIS — I2510 Atherosclerotic heart disease of native coronary artery without angina pectoris: Secondary | ICD-10-CM | POA: Diagnosis present

## 2023-12-31 DIAGNOSIS — I951 Orthostatic hypotension: Secondary | ICD-10-CM | POA: Diagnosis not present

## 2023-12-31 DIAGNOSIS — Z87891 Personal history of nicotine dependence: Secondary | ICD-10-CM

## 2023-12-31 DIAGNOSIS — N1831 Chronic kidney disease, stage 3a: Secondary | ICD-10-CM

## 2023-12-31 DIAGNOSIS — R0602 Shortness of breath: Secondary | ICD-10-CM | POA: Diagnosis not present

## 2023-12-31 DIAGNOSIS — R9389 Abnormal findings on diagnostic imaging of other specified body structures: Secondary | ICD-10-CM | POA: Diagnosis not present

## 2023-12-31 DIAGNOSIS — Z79899 Other long term (current) drug therapy: Secondary | ICD-10-CM

## 2023-12-31 DIAGNOSIS — I10 Essential (primary) hypertension: Secondary | ICD-10-CM | POA: Diagnosis not present

## 2023-12-31 DIAGNOSIS — Z951 Presence of aortocoronary bypass graft: Secondary | ICD-10-CM

## 2023-12-31 DIAGNOSIS — S72001S Fracture of unspecified part of neck of right femur, sequela: Secondary | ICD-10-CM | POA: Diagnosis not present

## 2023-12-31 DIAGNOSIS — Z09 Encounter for follow-up examination after completed treatment for conditions other than malignant neoplasm: Secondary | ICD-10-CM

## 2023-12-31 HISTORY — PX: HIP SURGERY: SHX245

## 2023-12-31 LAB — BASIC METABOLIC PANEL WITH GFR
Anion gap: 10 (ref 5–15)
BUN: 20 mg/dL (ref 8–23)
CO2: 25 mmol/L (ref 22–32)
Calcium: 9.5 mg/dL (ref 8.9–10.3)
Chloride: 104 mmol/L (ref 98–111)
Creatinine, Ser: 1.2 mg/dL — ABNORMAL HIGH (ref 0.44–1.00)
GFR, Estimated: 43 mL/min — ABNORMAL LOW (ref >60)
Glucose, Bld: 147 mg/dL — ABNORMAL HIGH (ref 70–99)
Potassium: 4.3 mmol/L (ref 3.5–5.1)
Sodium: 139 mmol/L (ref 135–145)

## 2023-12-31 LAB — GLUCOSE, CAPILLARY
Glucose-Capillary: 126 mg/dL — ABNORMAL HIGH (ref 70–99)
Glucose-Capillary: 163 mg/dL — ABNORMAL HIGH (ref 70–99)

## 2023-12-31 LAB — CBC WITH DIFFERENTIAL/PLATELET
Abs Immature Granulocytes: 0.02 K/uL (ref 0.00–0.07)
Basophils Absolute: 0 K/uL (ref 0.0–0.1)
Basophils Relative: 0 %
Eosinophils Absolute: 0.1 K/uL (ref 0.0–0.5)
Eosinophils Relative: 1 %
HCT: 32.4 % — ABNORMAL LOW (ref 36.0–46.0)
Hemoglobin: 11.2 g/dL — ABNORMAL LOW (ref 12.0–15.0)
Immature Granulocytes: 0 %
Lymphocytes Relative: 18 %
Lymphs Abs: 1.3 K/uL (ref 0.7–4.0)
MCH: 31.4 pg (ref 26.0–34.0)
MCHC: 34.6 g/dL (ref 30.0–36.0)
MCV: 90.8 fL (ref 80.0–100.0)
Monocytes Absolute: 0.6 K/uL (ref 0.1–1.0)
Monocytes Relative: 8 %
Neutro Abs: 5.1 K/uL (ref 1.7–7.7)
Neutrophils Relative %: 73 %
Platelets: 146 K/uL — ABNORMAL LOW (ref 150–400)
RBC: 3.57 MIL/uL — ABNORMAL LOW (ref 3.87–5.11)
RDW: 12.7 % (ref 11.5–15.5)
WBC: 7 K/uL (ref 4.0–10.5)
nRBC: 0 % (ref 0.0–0.2)

## 2023-12-31 MED ORDER — HYDROMORPHONE HCL 1 MG/ML IJ SOLN: 0.5000 mg | INTRAMUSCULAR | Status: DC | PRN

## 2023-12-31 MED ORDER — ASPIRIN 81 MG PO TBEC
81.0000 mg | DELAYED_RELEASE_TABLET | Freq: Every day | ORAL | Status: DC
  Administered 2024-01-02 – 2024-01-04 (×3): 81 mg via ORAL
  Filled 2023-12-31 (×4): qty 1

## 2023-12-31 MED ORDER — HEPARIN SODIUM (PORCINE) 5000 UNIT/ML IJ SOLN
5000.0000 [IU] | Freq: Three times a day (TID) | INTRAMUSCULAR | Status: DC
  Filled 2023-12-31 (×2): qty 1

## 2023-12-31 MED ORDER — SENNA 8.6 MG PO TABS
1.0000 | ORAL_TABLET | Freq: Every day | ORAL | Status: DC
  Administered 2023-12-31 – 2024-01-02 (×3): 8.6 mg via ORAL
  Filled 2023-12-31 (×3): qty 1

## 2023-12-31 MED ORDER — POLYETHYLENE GLYCOL 3350 17 G PO PACK: 17.0000 g | PACK | Freq: Every day | ORAL | Status: DC | PRN

## 2023-12-31 MED ORDER — HYDROCODONE-ACETAMINOPHEN 5-325 MG PO TABS: 1.0000 | ORAL_TABLET | ORAL | Status: DC | PRN

## 2023-12-31 MED ORDER — METOPROLOL SUCCINATE ER 50 MG PO TB24: 50.0000 mg | ORAL_TABLET | Freq: Every day | ORAL | Status: DC

## 2023-12-31 MED ORDER — MORPHINE SULFATE (PF) 4 MG/ML IV SOLN
4.0000 mg | Freq: Once | INTRAVENOUS | Status: AC
  Administered 2023-12-31: 15:00:00 4 mg via INTRAVENOUS
  Filled 2023-12-31: qty 1

## 2023-12-31 MED ORDER — INSULIN ASPART 100 UNIT/ML IJ SOLN
0.0000 [IU] | Freq: Three times a day (TID) | INTRAMUSCULAR | Status: DC
  Administered 2024-01-02: 09:00:00 5 [IU] via SUBCUTANEOUS
  Administered 2024-01-02: 17:00:00 3 [IU] via SUBCUTANEOUS
  Administered 2024-01-02: 12:00:00 5 [IU] via SUBCUTANEOUS
  Administered 2024-01-03: 2 [IU] via SUBCUTANEOUS
  Administered 2024-01-03: 5 [IU] via SUBCUTANEOUS
  Administered 2024-01-03: 3 [IU] via SUBCUTANEOUS
  Administered 2024-01-04: 5 [IU] via SUBCUTANEOUS
  Administered 2024-01-04 – 2024-01-06 (×5): 3 [IU] via SUBCUTANEOUS
  Administered 2024-01-06: 2 [IU] via SUBCUTANEOUS
  Filled 2023-12-31 (×3): qty 3
  Filled 2023-12-31: qty 5
  Filled 2023-12-31 (×2): qty 3
  Filled 2023-12-31: qty 2
  Filled 2023-12-31: qty 3
  Filled 2023-12-31: qty 2
  Filled 2023-12-31 (×2): qty 5
  Filled 2023-12-31 (×2): qty 3
  Filled 2023-12-31: qty 5

## 2023-12-31 MED ORDER — HYDROCODONE-ACETAMINOPHEN 5-325 MG PO TABS: 2.0000 | ORAL_TABLET | ORAL | Status: DC | PRN

## 2023-12-31 MED ORDER — INSULIN ASPART 100 UNIT/ML IJ SOLN: 0.0000 [IU] | Freq: Every day | INTRAMUSCULAR | Status: DC

## 2023-12-31 MED ORDER — INSULIN GLARGINE-YFGN 100 UNIT/ML ~~LOC~~ SOLN: 22.0000 [IU] | Freq: Every day | SUBCUTANEOUS | Status: DC

## 2023-12-31 MED ORDER — ACETAMINOPHEN 325 MG PO TABS
650.0000 mg | ORAL_TABLET | Freq: Four times a day (QID) | ORAL | Status: DC
  Administered 2023-12-31 – 2024-01-06 (×19): 650 mg via ORAL
  Filled 2023-12-31 (×21): qty 2

## 2023-12-31 MED ORDER — INSULIN GLARGINE 100 UNIT/ML ~~LOC~~ SOLN
22.0000 [IU] | Freq: Every day | SUBCUTANEOUS | Status: DC
  Filled 2023-12-31: qty 0.22

## 2023-12-31 NOTE — ED Notes (Signed)
 NURSE HEIDY RN INFORMED OF BED ASSIGNED

## 2023-12-31 NOTE — Plan of Care (Signed)
°  Problem: Education: Goal: Knowledge of General Education information will improve Description: Including pain rating scale, medication(s)/side effects and non-pharmacologic comfort measures Outcome: Progressing   Problem: Clinical Measurements: Goal: Ability to maintain clinical measurements within normal limits will improve Outcome: Progressing   Problem: Activity: Goal: Risk for activity intolerance will decrease Outcome: Not Progressing   Problem: Nutrition: Goal: Adequate nutrition will be maintained Outcome: Progressing

## 2023-12-31 NOTE — Hospital Course (Signed)
 Unsteady at baseline.    She was seen by neurologist.

## 2023-12-31 NOTE — Consult Note (Signed)
 ORTHOPAEDIC CONSULTATION  REQUESTING PHYSICIAN: Franchot Novel, MD  Chief Complaint:   Right intertrochanteric fracture  History of Present Illness: Laura Ferguson is a 89 y.o. female past medical history of hypertension, diabetes, CAD, and GERD who presented to the emergency room while slipping trying out of a vehicle and fell backwards onto her right hip and struck the back of her head.  She denies loss of consciousness.  She reported immediate pain in her right hip and was unable to get up or bear weight.  She reports the pain radiates down her thigh towards her knee.  Denies any pain in her tibia calf ankle or foot.  Denies any pre-existing right hip pain does report pre-existing right knee pain secondary to arthritis.  Denies any numbness or tingling in the leg at this time.  Nuys any shortness of breath or chest pain.  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 IVAR Sor)   CORONARY ARTERY BYPASS GRAFT  05/09/2007   LIMA to LAD, veing to intermediate; SVG to OM1 & OM2; SVG to acute marginal & distal RCA (Dr. Kerrin)   TRANSTHORACIC ECHOCARDIOGRAM  02/13/2010   EF =>55%, vigorous contraction EF 65%; LA  mild-mod dilated; IV normal diameter - normal CVP; trace MR; mild TR; trace AV regurg   Social History   Socioeconomic History   Marital status: Widowed    Spouse name: Not on file   Number of children: 7   Years of education: Not on file   Highest education level: Not on file  Occupational History   Not on file  Tobacco Use   Smoking status: Never   Smokeless tobacco: Never   Tobacco comments:    quit 1960's  smoked very lightly.  Vaping Use   Vaping status: Never Used  Substance and Sexual Activity   Alcohol use: No    Alcohol/week: 0.0 standard drinks of alcohol   Drug use: No   Sexual activity: Not Currently  Other Topics Concern   Not on file  Social History Narrative   Married.   Retired.   Works as a Engineer, Structural.    Enjoys helping other.    Social Drivers of Health   Tobacco Use: Low Risk (12/31/2023)   Patient History    Smoking Tobacco Use: Never    Smokeless Tobacco Use: Never    Passive Exposure: Not on file  Financial Resource Strain: Low Risk  (12/26/2023)   Received from [facility]   Overall Financial Resource Strain (CARDIA)    Difficulty of Paying Living Expenses: Not very hard  Food Insecurity: No Food Insecurity (12/26/2023)   Received from [facility]   Epic    Within  the past 12 months, you worried that your food would run out before you got the money to buy more.: Never true    Within the past 12 months, the food you bought just didn't last and you didn't have money to get more.: Never true  Recent Concern: Food Insecurity - Food Insecurity Present (12/26/2023)   Received from [facility]   Epic    Within the past 12 months, you worried that your food would run out before you got the money to buy more.: Often true    Within the past 12 months, the food you bought just didn't last and you didn't have money to get more.: Never true  Transportation Needs: No Transportation Needs (12/26/2023)    Received from [facility] - Transportation    In the past 12 months, has lack of transportation kept you from medical appointments or from getting medications?: No    Lack of Transportation (Non-Medical): No  Physical Activity: Inactive (01/22/2023)   Exercise Vital Sign    Days of Exercise per Week: 0 days    Minutes of Exercise per Session: 0 min  Stress: No Stress Concern Present (01/22/2023)   [employer] - Occupational Stress Questionnaire    Feeling of Stress : Only a little  Social Connections: Moderately Isolated (01/22/2023)   Social Connection and Isolation Panel    Frequency of Communication with Friends and Family: More than three times a week    Frequency of Social Gatherings with Friends and Family: Once a week    Attends Religious Services: More than 4 times per year    Active Member of [employer] or Organizations: No    Attends Banker Meetings: Never    Marital Status: Widowed  Depression (PHQ2-9): Low Risk (11/28/2023)   Depression (PHQ2-9)    PHQ-2 Score: 0  Recent Concern: Depression (PHQ2-9) - High Risk (09/20/2023)   Depression (PHQ2-9)    PHQ-2 Score: 21  Alcohol Screen: Low Risk (01/22/2023)   Alcohol Screen    Last Alcohol Screening Score (AUDIT): 0  Housing: Low Risk  (12/26/2023)   Received from [facility]   Epic    In the last 12 months, was there a time when you were not able to pay the mortgage or rent on time?: No    In the past 12 months, how many times have you moved where you were living?: 0    At any time in the past 12 months, were you homeless or living in a shelter (including now)?: No  Utilities: Not At Risk (12/26/2023)   Received from [facility]   Epic    In the past 12 months has the electric, gas, oil, or water company threatened to shut off services in your home?: No  Health Literacy: Adequate Health Literacy (01/22/2023)   B1300 Health Literacy     Frequency of need for help with medical instructions: Never   Family History  Problem Relation Age of Onset   Cancer Mother    Diabetes Sister    Cancer Child    Allergies[1] Prior to Admission medications  Medication Sig Start Date End Date Taking? Authorizing Provider  Acetaminophen  (TYLENOL  8 HOUR PO) Take 1 tablet by mouth every 8 (eight) hours as needed.    Yes [provider]  aspirin  81 MG tablet Take 81 mg by mouth daily.   Yes [provider]  Cholecalciferol (VITAMIN D )  50 MCG (2000 UT) CAPS Take 1 capsule by mouth daily.   Yes [provider]  ezetimibe  (ZETIA ) 10 MG tablet Take 1 tablet (10 mg total) by mouth daily. 11/18/23  Yes Weaver, Scott T, PA-C  linagliptin  (TRADJENTA ) 5 MG TABS tablet Take 1 tablet (5 mg total) by mouth daily. for diabetes. 11/25/23  Yes Gretta Comer POUR, NP  losartan  (COZAAR ) 100 MG tablet Take 1 tablet (100 mg total) by mouth daily. 04/15/23  Yes Burnard Debby LABOR, MD  metoprolol  succinate (TOPROL -XL) 50 MG 24 hr tablet Take 1 tablet (50 mg total) by mouth daily. Take with or immediately following a meal. 11/18/23  Yes Weaver, Scott T, PA-C  Multiple Vitamin (MULTIVITAMIN) capsule Take 1 capsule by mouth daily.   Yes [provider]  pantoprazole  (PROTONIX ) 40 MG tablet Take 1 tablet by mouth once daily 07/12/23  Yes Burnard Debby LABOR, MD  Polyethyl Glycol-Propyl Glycol (SYSTANE OP) Apply 2 drops to eye 2 (two) times daily.    Yes [provider]  rosuvastatin  (CRESTOR ) 10 MG tablet Take 1 tablet (10 mg total) by mouth daily. for cholesterol. 04/15/23  Yes Burnard Debby LABOR, MD  sertraline  (ZOLOFT ) 25 MG tablet Take 1 tablet (25 mg total) by mouth daily. for anxiety and depression. 11/25/23  Yes Clark, Katherine K, NP  TRAVATAN Z 0.004 % SOLN ophthalmic solution Place 1 drop into both eyes at bedtime. 10/14/14  Yes [provider]  Azelastine HCl 137 MCG/SPRAY SOLN As needed Patient not taking: Reported on  12/31/2023 07/21/19   [provider]  Blood Glucose Monitoring Suppl (ACCU-CHEK GUIDE) w/Device KIT Use as instructed to check blood sugar. 06/06/21   Trixie File, MD  Continuous Glucose Receiver (FREESTYLE LIBRE 3 READER) DEVI Use to check blood sugar continuously (broken reader, try for lost/damaged med override). E11.65, Z79.4 10/18/23   Wendee Lynwood HERO, NP  Continuous Glucose Sensor (FREESTYLE LIBRE 3 PLUS SENSOR) MISC Change sensor every 15 days. 06/28/23   Gretta Comer POUR, NP  Continuous Glucose Sensor (FREESTYLE LIBRE 3 SENSOR) MISC USE ONE SENSOR EVERY 14 DAYS TO CONTINOUSLY MONITOR GLUCOSE 12/04/23   Gretta Comer POUR, NP  glucose blood (ACCU-CHEK GUIDE) test strip USE TO CHECK SUGAR THREE TIMES DAILY AS DIRECTED 12/27/20   Trixie File, MD  Insulin  Pen Needle (BD PEN NEEDLE NANO 2ND GEN) 32G X 4 MM MISC USE 1 PEN NEEDLE WITH INSULIN  PEN once daily 10/05/22   Clark, Katherine K, NP  LANTUS  SOLOSTAR 100 UNIT/ML Solostar Pen Inject 30 Units into the skin daily. 11/25/23   [provider]   CT Knee Right Wo Contrast Result Date: 12/31/2023 CLINICAL DATA:  Status post fall, right knee pain EXAM: CT OF THE RIGHT KNEE WITHOUT CONTRAST TECHNIQUE: Multidetector CT imaging of the right knee was performed according to the standard protocol. Multiplanar CT image reconstructions were also generated. RADIATION DOSE REDUCTION: This exam was performed according to the departmental dose-optimization program which includes automated exposure control, adjustment of the mA and/or kV according to patient size and/or use of iterative reconstruction technique. COMPARISON:  Right knee x-ray 12/31/2023 FINDINGS: Bones/Joint/Cartilage Generalized osteopenia. No fracture or dislocation. Normal alignment. No joint effusion. Severe osteoarthritis of the weight-bearing lateral femorotibial compartment with subchondral cystic changes and subchondral sclerosis. Mild osteoarthritis of the  weight-bearing medial femorotibial compartment. Patellofemoral joint space is maintained. No aggressive osseous lesion. Ligaments Ligaments are suboptimally evaluated by CT. Muscles and Tendons Muscles are normal. No muscle atrophy. No intramuscular fluid collection or  hematoma. Moderate atrophy of the medial gastrocnemius muscle. Soft tissue No fluid collection or hematoma. No soft tissue mass. Peripheral vascular atherosclerotic disease. IMPRESSION: 1. No acute osseous injury of the right knee. 2. Severe osteoarthritis of the weight-bearing lateral femorotibial compartment. Mild osteoarthritis of the weight-bearing medial femorotibial compartment. Electronically Signed   By: Julaine Blanch M.D.   On: 12/31/2023 15:35   CT Head Wo Contrast Result Date: 12/31/2023 CLINICAL DATA:  Fall landing on right hip while getting out of car. Also injuring right side of head. EXAM: CT HEAD WITHOUT CONTRAST CT CERVICAL SPINE WITHOUT CONTRAST TECHNIQUE: Multidetector CT imaging of the head and cervical spine was performed following the standard protocol without intravenous contrast. Multiplanar CT image reconstructions of the cervical spine were also generated. RADIATION DOSE REDUCTION: This exam was performed according to the departmental dose-optimization program which includes automated exposure control, adjustment of the mA and/or kV according to patient size and/or use of iterative reconstruction technique. COMPARISON:  None Available. FINDINGS: CT HEAD FINDINGS Brain: Ventricles, cisterns and other CSF spaces are normal. No mass, mass effect, shift of midline structures or acute hemorrhage. No acute infarction. Vascular: No hyperdense vessel or unexpected calcification. Skull: Normal. Negative for fracture or focal lesion. Sinuses/Orbits: No acute finding. Other: None. CT CERVICAL SPINE FINDINGS Alignment: No posttraumatic subluxation. Skull base and vertebrae: Mild to moderate spondylosis throughout the cervical spine to  include uncovertebral joint spurring and facet arthropathy. No compression fracture. No other acute fracture. Mild right-sided neural foraminal narrowing at the C3-4 level. Mild left-sided neural foraminal narrowing at the C4-5 level. Soft tissues and spinal canal: Prevertebral soft tissues are normal. No significant spinal canal stenosis. Disc levels: Disc space narrowing at the C4-5 level and C6-7 levels. Upper chest: No acute findings. Other: None. IMPRESSION: 1. No acute brain injury. 2. No acute cervical spine injury. 3. Mild to moderate spondylosis throughout the cervical spine with disc disease at the C4-5 and C6-7 levels. Minimal right-sided neural foraminal narrowing at the C3-4 level and left-sided neural from narrowing at the C4-5 level. Electronically Signed   By: Toribio Agreste M.D.   On: 12/31/2023 14:48   CT Cervical Spine Wo Contrast Result Date: 12/31/2023 CLINICAL DATA:  Fall landing on right hip while getting out of car. Also injuring right side of head. EXAM: CT HEAD WITHOUT CONTRAST CT CERVICAL SPINE WITHOUT CONTRAST TECHNIQUE: Multidetector CT imaging of the head and cervical spine was performed following the standard protocol without intravenous contrast. Multiplanar CT image reconstructions of the cervical spine were also generated. RADIATION DOSE REDUCTION: This exam was performed according to the departmental dose-optimization program which includes automated exposure control, adjustment of the mA and/or kV according to patient size and/or use of iterative reconstruction technique. COMPARISON:  None Available. FINDINGS: CT HEAD FINDINGS Brain: Ventricles, cisterns and other CSF spaces are normal. No mass, mass effect, shift of midline structures or acute hemorrhage. No acute infarction. Vascular: No hyperdense vessel or unexpected calcification. Skull: Normal. Negative for fracture or focal lesion. Sinuses/Orbits: No acute finding. Other: None. CT CERVICAL SPINE FINDINGS Alignment: No  posttraumatic subluxation. Skull base and vertebrae: Mild to moderate spondylosis throughout the cervical spine to include uncovertebral joint spurring and facet arthropathy. No compression fracture. No other acute fracture. Mild right-sided neural foraminal narrowing at the C3-4 level. Mild left-sided neural foraminal narrowing at the C4-5 level. Soft tissues and spinal canal: Prevertebral soft tissues are normal. No significant spinal canal stenosis. Disc levels: Disc space narrowing at the C4-5  level and C6-7 levels. Upper chest: No acute findings. Other: None. IMPRESSION: 1. No acute brain injury. 2. No acute cervical spine injury. 3. Mild to moderate spondylosis throughout the cervical spine with disc disease at the C4-5 and C6-7 levels. Minimal right-sided neural foraminal narrowing at the C3-4 level and left-sided neural from narrowing at the C4-5 level. Electronically Signed   By: Toribio Agreste M.D.   On: 12/31/2023 14:48   DG Chest Portable 1 View Result Date: 12/31/2023 CLINICAL DATA:  Preop EXAM: PORTABLE CHEST 1 VIEW COMPARISON:  Jun 12, 2007 FINDINGS: The heart size and mediastinal contours are within normal limits. Status post coronary artery bypass graft. Both lungs are clear. The visualized skeletal structures are unremarkable. IMPRESSION: No active disease. Electronically Signed   By: Lynwood Landy Raddle M.D.   On: 12/31/2023 14:36   DG Knee Complete 4 Views Right Result Date: 12/31/2023 CLINICAL DATA:  fall, landing on right hip with noted shortening on the physical exam EXAM: RIGHT KNEE - COMPLETE 4+ VIEW COMPARISON:  None Available. FINDINGS: Diffuse osteopenia.Subtle lucency in the medial aspect of the lateral tibial plateau with subchondral sclerosis of the lateral tibial plateau surface no joint effusion. Severe joint space loss of the lateral compartment. Mild medial compartment and patellofemoral joint space loss. Peripheral vascular atherosclerosis. IMPRESSION: Diffuse osteopenia.  Subtle lucency in the medial aspect of the lateral tibial plateau with adjacent bony sclerosis of the articular surface. In the absence of a significant joint effusion or lipohemarthrosis, the lucency may be artifactual and the bony sclerotic changes may be related to advanced osteoarthritis. If there is further concern for underlying fracture, a CT of the knee should be considered. Electronically Signed   By: Rogelia Myers M.D.   On: 12/31/2023 14:23   DG Hip Unilat W or Wo Pelvis 2-3 Views Right Result Date: 12/31/2023 EXAM: 2 or 3 VIEW(S) XRAY OF THE RIGHT HIP 12/31/2023 02:06:00 PM COMPARISON: None available. CLINICAL HISTORY: fall FINDINGS: BONES AND JOINTS: Comminuted intertrochanteric fractures of the proximal right femur with varus angulation. Probable nondisplaced fractures of the left superior pubic ramus. Degenerative changes in the left hip. No dislocation at the hip joint. Si joints and symphysis pubis are not displaced. LUMBAR SPINE: Degenerative changes in the lower lumbar spine. SOFT TISSUES: Prominent calcifications in the pelvis likely representing calcified fibroids. IMPRESSION: 1. Comminuted intertrochanteric fracture of the proximal RIGHT femur with varus angulation. No hip dislocation. 2. Probable nondisplaced fracture of the LEFT superior pubic ramus. Electronically signed by: Elsie Gravely MD 12/31/2023 02:15 PM EST RP Workstation: [id number]    Positive ROS: All other systems have been reviewed and were otherwise negative with the exception of those mentioned in the HPI and as above.  Physical Exam: General:  Alert, no acute distress Psychiatric: Patient answers questions appropriately and appears alert and oriented though hard of hearing Cardiovascular:  No pedal edema Respiratory:  No wheezing, non-labored breathing GI:  Abdomen is soft and non-tender Skin:  No lesions in the area of chief complaint Neurologic:  Sensation intact distally Lymphatic:  No axillary or  cervical lymphadenopathy  Orthopedic Exam:  Right lower extremity Shortened and externally rotated Skin intact over the hip with some noted swelling and tenderness over the lateral trochanter No bony tenderness over the distal femur, knee, tibia ankle or or foot Compartments all soft throughout the leg Able to dorsiflex and plantarflex the ankle foot and toes Intact dorsalis pedis pulse neurovascular intact to the foot  Secondary survey No tenderness  to palpation over other bony prominences in the lower extremities or bilateral upper extremities No pain with logroll or simulated axial loading of the left lower extremity All compartments soft No tenderness to palpation over the cervical or thoracic spine, no bony step-off Motor grossly intact throughout, no focal deficits Sensation grossly intact throughout, no focal deficits Good distal pulses and capillary refill on all extremities  X-rays:  X-rays and CT scan images and report reviewed by myself of the right hip and knee.  Patient has a comminuted intertrochanteric right femur fracture no fractures noted to the distal femur or knee.  Degenerative changes noted on the CT scan of the knee no fractures noted.  Agree with radiology interpretation  Assessment: Right intertrochanteric femur fracture  Plan: Tashonda is an 89 year old female who presents with a right intertrochanteric femur fracture.  I discussed the treatment options including nonoperative and operative options with the patient and her daughters at bedside.  Under shared decision making model and after reviewing the risks and benefits of each option patient and family agree with the plan move forward with right hip open reduction internal fixation. The patient will need a right hip intramedullary nail.  A long discussion took place with the patient describing what a intramedullary nail is and what the procedure would entail. The xrays were reviewed with the patient and the  implants were discussed. The ability to secure the implant utilizing screw and blade fixation was discussed. Surgical exposures were discussed with the patient.    The hospitalization and post-operative care and rehabilitation were also discussed. The use of perioperative antibiotics and DVT prophylaxis were discussed. The risk, benefits and alternatives to a surgical intervention were discussed at length with the patient. The patient was also advised of risks related to the medical comorbidities. A lengthy discussion took place to review the most common complications including but not limited to: deep vein thrombosis, pulmonary embolus, heart attack, stroke, infection, wound breakdown, dislocation, numbness, leg length in-equality, damage to nerves, intraoperative fracture, hardware cut out or failure, malunion/ nonunion, tendon,muscles, arteries or other blood vessels, death and other possible complications from anesthesia. The patient was told that we will take steps to minimize these risks by using sterile technique, antibiotics and DVT prophylaxis when appropriate and follow the patient postoperatively in the office setting to monitor progress. The possibility of recurrent pain, no improvement in pain and actual worsening of pain were also discussed with the patient.      The benefits of surgery were discussed with the patient including the potential for improving the patient's current clinical condition through operative intervention. Alternatives to surgical intervention including conservative management were also discussed in detail. All questions were answered to the satisfaction of the patient. The patient participated and agreed to the plan of care as well as the use of the recommended implants for their surgery.    Plan for surgery tomorrow 01/01/2024 N.p.o. after midnight for the operating room Hold anticoagulation after midnight Will complete consent with the daughters in preop holding  tomorrow and mark the leg at that time. Medical optimization for the OR     Arthea Sheer MD  Beeper #:  [phone]  12/31/2023 6:06 PM     [1] No Known Allergies

## 2023-12-31 NOTE — ED Provider Notes (Signed)
 [facility] Provider Note    Event Date/Time   First MD Initiated Contact with Patient 12/31/23 1321     (approximate)   History   Chief Complaint Fall   HPI  Laura Ferguson is a 89 y.o. female with past medical history of hypertension, diabetes, CAD, and GERD who presents to the ED complaining of fall.  Patient reports that just prior to arrival she slipped while trying to get out of the car, ended up falling backwards onto concrete, striking the back of her head and her right hip.  She had immediate onset of pain to her right hip and has been unable to bear weight on the leg since then.  She denies losing consciousness and does not take a blood thinner.  She primarily complains of pain in her right hip and right knee, improved after receiving 150 mcg of fentanyl  with EMS.     Physical Exam   Triage Vital Signs: ED Triage Vitals  Encounter Vitals Group     BP 12/31/23 1320 (!) 126/55     Girls Systolic BP Percentile --      Girls Diastolic BP Percentile --      Boys Systolic BP Percentile --      Boys Diastolic BP Percentile --      Pulse Rate 12/31/23 1320 60     Resp 12/31/23 1320 16     Temp 12/31/23 1320 98.1 F (36.7 C)     Temp Source 12/31/23 1320 Oral     SpO2 12/31/23 1320 99 %     Weight 12/31/23 1322 119 lb (54 kg)     Height 12/31/23 1322 5' 2 (1.575 m)     Head Circumference --      Peak Flow --      Pain Score 12/31/23 1321 0     Pain Loc --      Pain Education --      Exclude from Growth Chart --     Most recent vital signs: Vitals:   12/31/23 1320  BP: (!) 126/55  Pulse: 60  Resp: 16  Temp: 98.1 F (36.7 C)  SpO2: 99%    Constitutional: Alert and oriented. Eyes: Conjunctivae are normal. Head: Atraumatic. Nose: No congestion/rhinnorhea. Mouth/Throat: Mucous membranes are moist.  Neck: No midline cervical spine tenderness to palpation. Cardiovascular: Normal rate, regular rhythm. Grossly normal heart sounds.  2+  radial and DP pulses bilaterally. Respiratory: Normal respiratory effort.  No retractions. Lungs CTAB.  No chest wall tenderness to palpation. Gastrointestinal: Soft and nontender. No distention. Musculoskeletal: Diffuse tenderness to palpation of right hip with shortening and external rotation.  Diffuse tenderness to palpation of right knee with no obvious deformity.  No upper extremity bony tenderness and no left lower extremity bony tenderness. Neurologic:  Normal speech and language. No gross focal neurologic deficits are appreciated.    ED Results / Procedures / Treatments   Labs (all labs ordered are listed, but only abnormal results are displayed) Labs Reviewed  CBC WITH DIFFERENTIAL/PLATELET  BASIC METABOLIC PANEL WITH GFR     RADIOLOGY Right hip x-ray reviewed and interpreted by me with comminuted intertrochanteric fracture, no dislocation noted.  PROCEDURES:  Critical Care performed: No  Procedures   MEDICATIONS ORDERED IN ED: Medications  morphine  (PF) 4 MG/ML injection 4 mg (has no administration in time range)     IMPRESSION / MDM / ASSESSMENT AND PLAN / ED COURSE  I reviewed the triage vital signs and  the nursing notes.                              89 y.o. female with past medical history of hypertension, diabetes, CAD, and GERD who presents to the ED complaining of right hip pain following a slip and fall while getting out of the car.  Patient's presentation is most consistent with acute presentation with potential threat to life or bodily function.  Differential diagnosis includes, but is not limited to, fracture, dislocation, contusion, intracranial injury, cervical spine injury.  Patient uncomfortable but nontoxic-appearing and in no acute distress, vital signs are unremarkable.  Patient has deformity of the right hip as well as tenderness of the right knee, x-rays are pending.  She is neurovascularly intact distally, will also check CT imaging of the  head and cervical spine.  She declines additional pain medication after receiving fentanyl  with EMS.  CT head and cervical spine are negative for acute process, right hip x-ray shows comminuted intertrochanteric fracture, right knee x-ray questionable for tibial plateau injury.  Case discussed with Dr. Lorelle, who request CT imaging of the knee, no further imaging needed of the pubic bone was needed at this time per orthopedics.  Tentative plan for surgical repair of the hip tomorrow.  Case discussed with hospitalist for admission.      FINAL CLINICAL IMPRESSION(S) / ED DIAGNOSES   Final diagnoses:  Closed fracture of right hip, initial encounter [facility])     Rx / DC Orders   ED Discharge Orders     None        Note:  This document was prepared using Dragon voice recognition software and may include unintentional dictation errors.   [name], MD 12/31/23 [id number]

## 2023-12-31 NOTE — H&P (Signed)
 History and Physical    Patient: Laura Ferguson FMW:[id number] DOB: Dec 02, 1932 DOA: 12/31/2023 DOS: the patient was seen and examined on 12/31/2023 PCP: Gretta Comer POUR, NP  Patient coming from: Home  Chief Complaint:  Chief Complaint  Patient presents with   Fall   HPI: Laura Ferguson is a 89 y.o. female with medical history significant of CAD status post CABG in 2009, hypertension, mild neurocognitive impairment, glaucoma and type 2 diabetes.  Patient states that she was getting out of the car after returning home from an appointment with ophthalmology for treatment of her glaucoma.  She states that she was getting out of the car and that she was holding onto the car door but it moved away from her causing her to fall backwards onto concrete, striking the back of her head and R hip. She denies LOC or dizziness/lightheadedness. She noted immediate pain in her R hip and could not bear pain. She was brought in via EMS.   She denies any difficulty eating or drinking, chest pain or palpitations, dyspnea, constipation or diarrhea.  Of note, patient per chart review has a history of vertigo and follows with ENT.  She has also had some balance issues.  She recently saw neurology for this.   Review of Systems: As mentioned in the history of present illness. All other systems reviewed and are negative. 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   LIMA to LAD, SVG to ramus intermedius, SVG to OM1 & OM2, SVG to acute marginal, SVG to distal RCA   Type 2 diabetes mellitus (HCC)    Past Surgical History:  Procedure Laterality Date    CARDIAC CATHETERIZATION  05/07/2007   EF 55%, focal mild hypocontractility in mid-distal inferior wall & mid posterolateral wall; severe multivessel CAD - susequent CABGx6 (Dr. IVAR Sor)   CORONARY ARTERY BYPASS GRAFT  05/09/2007   LIMA to LAD, veing to intermediate; SVG to OM1 & OM2; SVG to acute marginal & distal RCA (Dr. Kerrin)   TRANSTHORACIC ECHOCARDIOGRAM  02/13/2010   EF =>55%, vigorous contraction EF 65%; LA mild-mod dilated; IV normal diameter - normal CVP; trace MR; mild TR; trace AV regurg   Social History:  reports that she has never smoked. She has never used smokeless tobacco. She reports that she does not drink alcohol and does not use drugs.  Allergies[1]  Family History  Problem Relation Age of Onset   Cancer Mother    Diabetes Sister    Cancer Child     Prior to Admission medications  Medication Sig Start Date End Date Taking? Authorizing Provider  Acetaminophen  (TYLENOL  8 HOUR PO) Take 1 tablet by mouth every 8 (eight) hours as needed.    Yes [provider]  aspirin  81 MG tablet Take 81 mg by mouth daily.   Yes [provider]  Cholecalciferol (VITAMIN D ) 50 MCG (2000 UT) CAPS Take 1 capsule by mouth daily.   Yes [provider]  ezetimibe  (ZETIA ) 10 MG tablet Take 1 tablet (10 mg total) by mouth daily. 11/18/23  Yes Lelon Hamilton T, PA-C  linagliptin  (TRADJENTA ) 5 MG TABS tablet Take 1 tablet (5 mg total) by mouth daily. for diabetes. 11/25/23  Yes Clark, Katherine K, NP  losartan  (COZAAR ) 100 MG tablet Take 1 tablet (100 mg total) by mouth daily. 04/15/23  Yes Burnard Debby LABOR, MD  metoprolol  succinate (TOPROL -XL) 50 MG 24 hr tablet Take 1 tablet (50 mg total) by mouth daily. Take with or immediately following a meal. 11/18/23  Yes Weaver, Scott T, PA-C  Multiple Vitamin (MULTIVITAMIN) capsule Take 1 capsule by mouth daily.   Yes [provider]  pantoprazole  (PROTONIX ) 40 MG tablet Take 1 tablet by mouth once daily 07/12/23  Yes  Burnard Debby LABOR, MD  Polyethyl Glycol-Propyl Glycol (SYSTANE OP) Apply 2 drops to eye 2 (two) times daily.    Yes [provider]  rosuvastatin  (CRESTOR ) 10 MG tablet Take 1 tablet (10 mg total) by mouth daily. for cholesterol. 04/15/23  Yes Burnard Debby LABOR, MD  sertraline  (ZOLOFT ) 25 MG tablet Take 1 tablet (25 mg total) by mouth daily. for anxiety and depression. 11/25/23  Yes Clark, Katherine K, NP  TRAVATAN Z 0.004 % SOLN ophthalmic solution Place 1 drop into both eyes at bedtime. 10/14/14  Yes [provider]  Azelastine HCl 137 MCG/SPRAY SOLN As needed Patient not taking: Reported on 12/31/2023 07/21/19   [provider]  Blood Glucose Monitoring Suppl (ACCU-CHEK GUIDE) w/Device KIT Use as instructed to check blood sugar. 06/06/21   Trixie File, MD  Continuous Glucose Receiver (FREESTYLE LIBRE 3 READER) DEVI Use to check blood sugar continuously (broken reader, try for lost/damaged med override). E11.65, Z79.4 10/18/23   Wendee Lynwood HERO, NP  Continuous Glucose Sensor (FREESTYLE LIBRE 3 PLUS SENSOR) MISC Change sensor every 15 days. 06/28/23   Gretta Comer POUR, NP  Continuous Glucose Sensor (FREESTYLE LIBRE 3 SENSOR) MISC USE ONE SENSOR EVERY 14 DAYS TO CONTINOUSLY MONITOR GLUCOSE 12/04/23   Gretta Comer POUR, NP  glucose blood (ACCU-CHEK GUIDE) test strip USE TO CHECK SUGAR THREE TIMES DAILY AS DIRECTED 12/27/20   Trixie File, MD  Insulin  Pen Needle (BD PEN NEEDLE NANO 2ND GEN) 32G X 4 MM MISC USE 1 PEN NEEDLE WITH INSULIN  PEN once daily 10/05/22   Clark, Katherine K, NP  LANTUS  SOLOSTAR 100 UNIT/ML Solostar Pen Inject 30 Units into the skin daily. 11/25/23   [provider]    Physical Exam: Vitals:   12/31/23 1320 12/31/23 1322  BP: (!) 126/55   Pulse: 60   Resp: 16   Temp: 98.1 F (36.7 C)   TempSrc: Oral   SpO2: 99%   Weight:  54 kg  Height:  5' 2 (1.575 m)   Physical Exam  Constitutional: In no distress.  Cardiovascular: Normal  rate, regular rhythm. No lower extremity edema  Pulmonary: Non labored breathing on room air, no wheezing or rales.   Abdominal: Soft. Non distended and non tender Musculoskeletal: R LE rotated outward, R hip TTP  Neurological: Alert and oriented to person, place,but not time. Skin: Skin is warm and dry.   Data Reviewed:     Latest Ref Rng & Units 12/31/2023    3:15 PM 05/22/2023   11:19 AM 09/21/2022    4:42 PM  CBC  WBC 4.0 - 10.5 K/uL 7.0  4.2  4.6   Hemoglobin 12.0 - 15.0 g/dL 88.7  88.3  87.4   Hematocrit 36.0 - 46.0 % 32.4  37.3  37.3   Platelets 150 - 400 K/uL 146  164  158  Latest Ref Rng & Units 12/31/2023    3:15 PM 06/04/2023   11:24 AM 05/22/2023   11:19 AM  BMP  Glucose 70 - 99 mg/dL 852  674  616   BUN 8 - 23 mg/dL 20   31   Creatinine 9.55 - 1.00 mg/dL 8.79   8.67   BUN/Creat Ratio 12 - 28   23   Sodium 135 - 145 mmol/L 139   141   Potassium 3.5 - 5.1 mmol/L 4.3   5.4   Chloride 98 - 111 mmol/L 104   105   CO2 22 - 32 mmol/L 25   18   Calcium  8.9 - 10.3 mg/dL 9.5   9.7      Assessment and Plan: R intertrochanteric fracture, comminuted L non displaced superior pubic ramus fx ED discussed with orthopedics plan for repair in the AM.  Non op for pubic ramus fx   T2DM insulin  dependent Uncontrolled  A1c 8.6 recently. On Tradjenta , lantus  30u at home, will continue Lantus  22 units tomorrow given OR plus sliding scale insulin    Hypertension Will hold home losartan  given normal blood pressures.  Continue metoprolol   Hyperlipidemia Resume medications after OR  CKD3a  Baseline serum creatinine appears to be 1.3    Latest Ref Rng & Units 12/31/2023    3:15 PM 06/04/2023   11:24 AM 05/22/2023   11:19 AM  BMP  Glucose 70 - 99 mg/dL 852  674  616   BUN 8 - 23 mg/dL 20   31   Creatinine 9.55 - 1.00 mg/dL 8.79   8.67   BUN/Creat Ratio 12 - 28   23   Sodium 135 - 145 mmol/L 139   141   Potassium 3.5 - 5.1 mmol/L 4.3   5.4   Chloride 98 - 111 mmol/L 104    105   CO2 22 - 32 mmol/L 25   18   Calcium  8.9 - 10.3 mg/dL 9.5   9.7   At baseline.  Normocytic anemia  Appears near baseline possible dilutional effect.  -check iron panel in the am   Mild neurocognitve impairment  Noted. Being worked up [employer] for this.   H/o Vitamin D  deficiency Resume after OR,  Check vitamin D   Advance Care Planning:   Code Status: Full Code Discussed with patient and her daughter at bedisde   Consults: Ortho  Family Communication: NOne  Severity of Illness: The appropriate patient status for this patient is INPATIENT. Inpatient status is judged to be reasonable and necessary in order to provide the required intensity of service to ensure the patient's safety. The patient's presenting symptoms, physical exam findings, and initial radiographic and laboratory data in the context of their chronic comorbidities is felt to place them at high risk for further clinical deterioration. Furthermore, it is not anticipated that the patient will be medically stable for discharge from the hospital within 2 midnights of admission.   * I certify that at the point of admission it is my clinical judgment that the patient will require inpatient hospital care spanning beyond 2 midnights from the point of admission due to high intensity of service, high risk for further deterioration and high frequency of surveillance required.*  Author: Alban Pepper, MD 12/31/2023 4:23 PM  For on call review [email or web address].     [1] No Known Allergies

## 2023-12-31 NOTE — ED Triage Notes (Signed)
 BIBEMS from home c/o of fall. Pt states they were coming out of car and their hand slipped while holding door handle. Pt fell and landed on their R hip and also hit the R side of their head. Pt denies LOC and blood thinners. Pt has noted shortening lateral rotation of R hip. 150 mcg of Fentanyl  given by EMS. Per EMS after third dose of Fentanyl  pt became more confused than baseline.   EMS VS: 190/60, 100% on RA, 145 CBG.

## 2023-12-31 NOTE — H&P (View-Only) (Signed)
 ORTHOPAEDIC CONSULTATION  REQUESTING PHYSICIAN: Franchot Novel, MD  Chief Complaint:   Right intertrochanteric fracture  History of Present Illness: Laura Ferguson is a 89 y.o. female past medical history of hypertension, diabetes, CAD, and GERD who presented to the emergency room while slipping trying out of a vehicle and fell backwards onto her right hip and struck the back of her head.  She denies loss of consciousness.  She reported immediate pain in her right hip and was unable to get up or bear weight.  She reports the pain radiates down her thigh towards her knee.  Denies any pain in her tibia calf ankle or foot.  Denies any pre-existing right hip pain does report pre-existing right knee pain secondary to arthritis.  Denies any numbness or tingling in the leg at this time.  Nuys any shortness of breath or chest pain.  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 IVAR Sor)   CORONARY ARTERY BYPASS GRAFT  05/09/2007   LIMA to LAD, veing to intermediate; SVG to OM1 & OM2; SVG to acute marginal & distal RCA (Dr. Kerrin)   TRANSTHORACIC ECHOCARDIOGRAM  02/13/2010   EF =>55%, vigorous contraction EF 65%; LA  mild-mod dilated; IV normal diameter - normal CVP; trace MR; mild TR; trace AV regurg   Social History   Socioeconomic History   Marital status: Widowed    Spouse name: Not on file   Number of children: 7   Years of education: Not on file   Highest education level: Not on file  Occupational History   Not on file  Tobacco Use   Smoking status: Never   Smokeless tobacco: Never   Tobacco comments:    quit 1960's  smoked very lightly.  Vaping Use   Vaping status: Never Used  Substance and Sexual Activity   Alcohol use: No    Alcohol/week: 0.0 standard drinks of alcohol   Drug use: No   Sexual activity: Not Currently  Other Topics Concern   Not on file  Social History Narrative   Married.   Retired.   Works as a Engineer, Structural.    Enjoys helping other.    Social Drivers of Health   Tobacco Use: Low Risk (12/31/2023)   Patient History    Smoking Tobacco Use: Never    Smokeless Tobacco Use: Never    Passive Exposure: Not on file  Financial Resource Strain: Low Risk  (12/26/2023)   Received from [facility]   Overall Financial Resource Strain (CARDIA)    Difficulty of Paying Living Expenses: Not very hard  Food Insecurity: No Food Insecurity (12/26/2023)   Received from [facility]   Epic    Within  the past 12 months, you worried that your food would run out before you got the money to buy more.: Never true    Within the past 12 months, the food you bought just didn't last and you didn't have money to get more.: Never true  Recent Concern: Food Insecurity - Food Insecurity Present (12/26/2023)   Received from [facility]   Epic    Within the past 12 months, you worried that your food would run out before you got the money to buy more.: Often true    Within the past 12 months, the food you bought just didn't last and you didn't have money to get more.: Never true  Transportation Needs: No Transportation Needs (12/26/2023)    Received from [facility] - Transportation    In the past 12 months, has lack of transportation kept you from medical appointments or from getting medications?: No    Lack of Transportation (Non-Medical): No  Physical Activity: Inactive (01/22/2023)   Exercise Vital Sign    Days of Exercise per Week: 0 days    Minutes of Exercise per Session: 0 min  Stress: No Stress Concern Present (01/22/2023)   [employer] - Occupational Stress Questionnaire    Feeling of Stress : Only a little  Social Connections: Moderately Isolated (01/22/2023)   Social Connection and Isolation Panel    Frequency of Communication with Friends and Family: More than three times a week    Frequency of Social Gatherings with Friends and Family: Once a week    Attends Religious Services: More than 4 times per year    Active Member of [employer] or Organizations: No    Attends Banker Meetings: Never    Marital Status: Widowed  Depression (PHQ2-9): Low Risk (11/28/2023)   Depression (PHQ2-9)    PHQ-2 Score: 0  Recent Concern: Depression (PHQ2-9) - High Risk (09/20/2023)   Depression (PHQ2-9)    PHQ-2 Score: 21  Alcohol Screen: Low Risk (01/22/2023)   Alcohol Screen    Last Alcohol Screening Score (AUDIT): 0  Housing: Low Risk  (12/26/2023)   Received from [facility]   Epic    In the last 12 months, was there a time when you were not able to pay the mortgage or rent on time?: No    In the past 12 months, how many times have you moved where you were living?: 0    At any time in the past 12 months, were you homeless or living in a shelter (including now)?: No  Utilities: Not At Risk (12/26/2023)   Received from [facility]   Epic    In the past 12 months has the electric, gas, oil, or water company threatened to shut off services in your home?: No  Health Literacy: Adequate Health Literacy (01/22/2023)   B1300 Health Literacy     Frequency of need for help with medical instructions: Never   Family History  Problem Relation Age of Onset   Cancer Mother    Diabetes Sister    Cancer Child    Allergies[1] Prior to Admission medications  Medication Sig Start Date End Date Taking? Authorizing Provider  Acetaminophen  (TYLENOL  8 HOUR PO) Take 1 tablet by mouth every 8 (eight) hours as needed.    Yes [provider]  aspirin  81 MG tablet Take 81 mg by mouth daily.   Yes [provider]  Cholecalciferol (VITAMIN D )  50 MCG (2000 UT) CAPS Take 1 capsule by mouth daily.   Yes [provider]  ezetimibe  (ZETIA ) 10 MG tablet Take 1 tablet (10 mg total) by mouth daily. 11/18/23  Yes Weaver, Scott T, PA-C  linagliptin  (TRADJENTA ) 5 MG TABS tablet Take 1 tablet (5 mg total) by mouth daily. for diabetes. 11/25/23  Yes Gretta Comer POUR, NP  losartan  (COZAAR ) 100 MG tablet Take 1 tablet (100 mg total) by mouth daily. 04/15/23  Yes Burnard Debby LABOR, MD  metoprolol  succinate (TOPROL -XL) 50 MG 24 hr tablet Take 1 tablet (50 mg total) by mouth daily. Take with or immediately following a meal. 11/18/23  Yes Weaver, Scott T, PA-C  Multiple Vitamin (MULTIVITAMIN) capsule Take 1 capsule by mouth daily.   Yes [provider]  pantoprazole  (PROTONIX ) 40 MG tablet Take 1 tablet by mouth once daily 07/12/23  Yes Burnard Debby LABOR, MD  Polyethyl Glycol-Propyl Glycol (SYSTANE OP) Apply 2 drops to eye 2 (two) times daily.    Yes [provider]  rosuvastatin  (CRESTOR ) 10 MG tablet Take 1 tablet (10 mg total) by mouth daily. for cholesterol. 04/15/23  Yes Burnard Debby LABOR, MD  sertraline  (ZOLOFT ) 25 MG tablet Take 1 tablet (25 mg total) by mouth daily. for anxiety and depression. 11/25/23  Yes Clark, Katherine K, NP  TRAVATAN Z 0.004 % SOLN ophthalmic solution Place 1 drop into both eyes at bedtime. 10/14/14  Yes [provider]  Azelastine HCl 137 MCG/SPRAY SOLN As needed Patient not taking: Reported on  12/31/2023 07/21/19   [provider]  Blood Glucose Monitoring Suppl (ACCU-CHEK GUIDE) w/Device KIT Use as instructed to check blood sugar. 06/06/21   Trixie File, MD  Continuous Glucose Receiver (FREESTYLE LIBRE 3 READER) DEVI Use to check blood sugar continuously (broken reader, try for lost/damaged med override). E11.65, Z79.4 10/18/23   Wendee Lynwood HERO, NP  Continuous Glucose Sensor (FREESTYLE LIBRE 3 PLUS SENSOR) MISC Change sensor every 15 days. 06/28/23   Gretta Comer POUR, NP  Continuous Glucose Sensor (FREESTYLE LIBRE 3 SENSOR) MISC USE ONE SENSOR EVERY 14 DAYS TO CONTINOUSLY MONITOR GLUCOSE 12/04/23   Gretta Comer POUR, NP  glucose blood (ACCU-CHEK GUIDE) test strip USE TO CHECK SUGAR THREE TIMES DAILY AS DIRECTED 12/27/20   Trixie File, MD  Insulin  Pen Needle (BD PEN NEEDLE NANO 2ND GEN) 32G X 4 MM MISC USE 1 PEN NEEDLE WITH INSULIN  PEN once daily 10/05/22   Clark, Katherine K, NP  LANTUS  SOLOSTAR 100 UNIT/ML Solostar Pen Inject 30 Units into the skin daily. 11/25/23   [provider]   CT Knee Right Wo Contrast Result Date: 12/31/2023 CLINICAL DATA:  Status post fall, right knee pain EXAM: CT OF THE RIGHT KNEE WITHOUT CONTRAST TECHNIQUE: Multidetector CT imaging of the right knee was performed according to the standard protocol. Multiplanar CT image reconstructions were also generated. RADIATION DOSE REDUCTION: This exam was performed according to the departmental dose-optimization program which includes automated exposure control, adjustment of the mA and/or kV according to patient size and/or use of iterative reconstruction technique. COMPARISON:  Right knee x-ray 12/31/2023 FINDINGS: Bones/Joint/Cartilage Generalized osteopenia. No fracture or dislocation. Normal alignment. No joint effusion. Severe osteoarthritis of the weight-bearing lateral femorotibial compartment with subchondral cystic changes and subchondral sclerosis. Mild osteoarthritis of the  weight-bearing medial femorotibial compartment. Patellofemoral joint space is maintained. No aggressive osseous lesion. Ligaments Ligaments are suboptimally evaluated by CT. Muscles and Tendons Muscles are normal. No muscle atrophy. No intramuscular fluid collection or  hematoma. Moderate atrophy of the medial gastrocnemius muscle. Soft tissue No fluid collection or hematoma. No soft tissue mass. Peripheral vascular atherosclerotic disease. IMPRESSION: 1. No acute osseous injury of the right knee. 2. Severe osteoarthritis of the weight-bearing lateral femorotibial compartment. Mild osteoarthritis of the weight-bearing medial femorotibial compartment. Electronically Signed   By: Julaine Blanch M.D.   On: 12/31/2023 15:35   CT Head Wo Contrast Result Date: 12/31/2023 CLINICAL DATA:  Fall landing on right hip while getting out of car. Also injuring right side of head. EXAM: CT HEAD WITHOUT CONTRAST CT CERVICAL SPINE WITHOUT CONTRAST TECHNIQUE: Multidetector CT imaging of the head and cervical spine was performed following the standard protocol without intravenous contrast. Multiplanar CT image reconstructions of the cervical spine were also generated. RADIATION DOSE REDUCTION: This exam was performed according to the departmental dose-optimization program which includes automated exposure control, adjustment of the mA and/or kV according to patient size and/or use of iterative reconstruction technique. COMPARISON:  None Available. FINDINGS: CT HEAD FINDINGS Brain: Ventricles, cisterns and other CSF spaces are normal. No mass, mass effect, shift of midline structures or acute hemorrhage. No acute infarction. Vascular: No hyperdense vessel or unexpected calcification. Skull: Normal. Negative for fracture or focal lesion. Sinuses/Orbits: No acute finding. Other: None. CT CERVICAL SPINE FINDINGS Alignment: No posttraumatic subluxation. Skull base and vertebrae: Mild to moderate spondylosis throughout the cervical spine to  include uncovertebral joint spurring and facet arthropathy. No compression fracture. No other acute fracture. Mild right-sided neural foraminal narrowing at the C3-4 level. Mild left-sided neural foraminal narrowing at the C4-5 level. Soft tissues and spinal canal: Prevertebral soft tissues are normal. No significant spinal canal stenosis. Disc levels: Disc space narrowing at the C4-5 level and C6-7 levels. Upper chest: No acute findings. Other: None. IMPRESSION: 1. No acute brain injury. 2. No acute cervical spine injury. 3. Mild to moderate spondylosis throughout the cervical spine with disc disease at the C4-5 and C6-7 levels. Minimal right-sided neural foraminal narrowing at the C3-4 level and left-sided neural from narrowing at the C4-5 level. Electronically Signed   By: Toribio Agreste M.D.   On: 12/31/2023 14:48   CT Cervical Spine Wo Contrast Result Date: 12/31/2023 CLINICAL DATA:  Fall landing on right hip while getting out of car. Also injuring right side of head. EXAM: CT HEAD WITHOUT CONTRAST CT CERVICAL SPINE WITHOUT CONTRAST TECHNIQUE: Multidetector CT imaging of the head and cervical spine was performed following the standard protocol without intravenous contrast. Multiplanar CT image reconstructions of the cervical spine were also generated. RADIATION DOSE REDUCTION: This exam was performed according to the departmental dose-optimization program which includes automated exposure control, adjustment of the mA and/or kV according to patient size and/or use of iterative reconstruction technique. COMPARISON:  None Available. FINDINGS: CT HEAD FINDINGS Brain: Ventricles, cisterns and other CSF spaces are normal. No mass, mass effect, shift of midline structures or acute hemorrhage. No acute infarction. Vascular: No hyperdense vessel or unexpected calcification. Skull: Normal. Negative for fracture or focal lesion. Sinuses/Orbits: No acute finding. Other: None. CT CERVICAL SPINE FINDINGS Alignment: No  posttraumatic subluxation. Skull base and vertebrae: Mild to moderate spondylosis throughout the cervical spine to include uncovertebral joint spurring and facet arthropathy. No compression fracture. No other acute fracture. Mild right-sided neural foraminal narrowing at the C3-4 level. Mild left-sided neural foraminal narrowing at the C4-5 level. Soft tissues and spinal canal: Prevertebral soft tissues are normal. No significant spinal canal stenosis. Disc levels: Disc space narrowing at the C4-5  level and C6-7 levels. Upper chest: No acute findings. Other: None. IMPRESSION: 1. No acute brain injury. 2. No acute cervical spine injury. 3. Mild to moderate spondylosis throughout the cervical spine with disc disease at the C4-5 and C6-7 levels. Minimal right-sided neural foraminal narrowing at the C3-4 level and left-sided neural from narrowing at the C4-5 level. Electronically Signed   By: Toribio Agreste M.D.   On: 12/31/2023 14:48   DG Chest Portable 1 View Result Date: 12/31/2023 CLINICAL DATA:  Preop EXAM: PORTABLE CHEST 1 VIEW COMPARISON:  Jun 12, 2007 FINDINGS: The heart size and mediastinal contours are within normal limits. Status post coronary artery bypass graft. Both lungs are clear. The visualized skeletal structures are unremarkable. IMPRESSION: No active disease. Electronically Signed   By: Lynwood Landy Raddle M.D.   On: 12/31/2023 14:36   DG Knee Complete 4 Views Right Result Date: 12/31/2023 CLINICAL DATA:  fall, landing on right hip with noted shortening on the physical exam EXAM: RIGHT KNEE - COMPLETE 4+ VIEW COMPARISON:  None Available. FINDINGS: Diffuse osteopenia.Subtle lucency in the medial aspect of the lateral tibial plateau with subchondral sclerosis of the lateral tibial plateau surface no joint effusion. Severe joint space loss of the lateral compartment. Mild medial compartment and patellofemoral joint space loss. Peripheral vascular atherosclerosis. IMPRESSION: Diffuse osteopenia.  Subtle lucency in the medial aspect of the lateral tibial plateau with adjacent bony sclerosis of the articular surface. In the absence of a significant joint effusion or lipohemarthrosis, the lucency may be artifactual and the bony sclerotic changes may be related to advanced osteoarthritis. If there is further concern for underlying fracture, a CT of the knee should be considered. Electronically Signed   By: Rogelia Myers M.D.   On: 12/31/2023 14:23   DG Hip Unilat W or Wo Pelvis 2-3 Views Right Result Date: 12/31/2023 EXAM: 2 or 3 VIEW(S) XRAY OF THE RIGHT HIP 12/31/2023 02:06:00 PM COMPARISON: None available. CLINICAL HISTORY: fall FINDINGS: BONES AND JOINTS: Comminuted intertrochanteric fractures of the proximal right femur with varus angulation. Probable nondisplaced fractures of the left superior pubic ramus. Degenerative changes in the left hip. No dislocation at the hip joint. Si joints and symphysis pubis are not displaced. LUMBAR SPINE: Degenerative changes in the lower lumbar spine. SOFT TISSUES: Prominent calcifications in the pelvis likely representing calcified fibroids. IMPRESSION: 1. Comminuted intertrochanteric fracture of the proximal RIGHT femur with varus angulation. No hip dislocation. 2. Probable nondisplaced fracture of the LEFT superior pubic ramus. Electronically signed by: Elsie Gravely MD 12/31/2023 02:15 PM EST RP Workstation: [id number]    Positive ROS: All other systems have been reviewed and were otherwise negative with the exception of those mentioned in the HPI and as above.  Physical Exam: General:  Alert, no acute distress Psychiatric: Patient answers questions appropriately and appears alert and oriented though hard of hearing Cardiovascular:  No pedal edema Respiratory:  No wheezing, non-labored breathing GI:  Abdomen is soft and non-tender Skin:  No lesions in the area of chief complaint Neurologic:  Sensation intact distally Lymphatic:  No axillary or  cervical lymphadenopathy  Orthopedic Exam:  Right lower extremity Shortened and externally rotated Skin intact over the hip with some noted swelling and tenderness over the lateral trochanter No bony tenderness over the distal femur, knee, tibia ankle or or foot Compartments all soft throughout the leg Able to dorsiflex and plantarflex the ankle foot and toes Intact dorsalis pedis pulse neurovascular intact to the foot  Secondary survey No tenderness  to palpation over other bony prominences in the lower extremities or bilateral upper extremities No pain with logroll or simulated axial loading of the left lower extremity All compartments soft No tenderness to palpation over the cervical or thoracic spine, no bony step-off Motor grossly intact throughout, no focal deficits Sensation grossly intact throughout, no focal deficits Good distal pulses and capillary refill on all extremities  X-rays:  X-rays and CT scan images and report reviewed by myself of the right hip and knee.  Patient has a comminuted intertrochanteric right femur fracture no fractures noted to the distal femur or knee.  Degenerative changes noted on the CT scan of the knee no fractures noted.  Agree with radiology interpretation  Assessment: Right intertrochanteric femur fracture  Plan: Laura Ferguson is an 89 year old female who presents with a right intertrochanteric femur fracture.  I discussed the treatment options including nonoperative and operative options with the patient and her daughters at bedside.  Under shared decision making model and after reviewing the risks and benefits of each option patient and family agree with the plan move forward with right hip open reduction internal fixation. The patient will need a right hip intramedullary nail.  A long discussion took place with the patient describing what a intramedullary nail is and what the procedure would entail. The xrays were reviewed with the patient and the  implants were discussed. The ability to secure the implant utilizing screw and blade fixation was discussed. Surgical exposures were discussed with the patient.    The hospitalization and post-operative care and rehabilitation were also discussed. The use of perioperative antibiotics and DVT prophylaxis were discussed. The risk, benefits and alternatives to a surgical intervention were discussed at length with the patient. The patient was also advised of risks related to the medical comorbidities. A lengthy discussion took place to review the most common complications including but not limited to: deep vein thrombosis, pulmonary embolus, heart attack, stroke, infection, wound breakdown, dislocation, numbness, leg length in-equality, damage to nerves, intraoperative fracture, hardware cut out or failure, malunion/ nonunion, tendon,muscles, arteries or other blood vessels, death and other possible complications from anesthesia. The patient was told that we will take steps to minimize these risks by using sterile technique, antibiotics and DVT prophylaxis when appropriate and follow the patient postoperatively in the office setting to monitor progress. The possibility of recurrent pain, no improvement in pain and actual worsening of pain were also discussed with the patient.      The benefits of surgery were discussed with the patient including the potential for improving the patient's current clinical condition through operative intervention. Alternatives to surgical intervention including conservative management were also discussed in detail. All questions were answered to the satisfaction of the patient. The patient participated and agreed to the plan of care as well as the use of the recommended implants for their surgery.    Plan for surgery tomorrow 01/01/2024 N.p.o. after midnight for the operating room Hold anticoagulation after midnight Will complete consent with the daughters in preop holding  tomorrow and mark the leg at that time. Medical optimization for the OR     Arthea Sheer MD  Beeper #:  [phone]  12/31/2023 6:06 PM     [1] No Known Allergies

## 2024-01-01 ENCOUNTER — Inpatient Hospital Stay: Admitting: Anesthesiology

## 2024-01-01 ENCOUNTER — Inpatient Hospital Stay

## 2024-01-01 ENCOUNTER — Encounter: Admission: EM | Disposition: A | Payer: Self-pay | Source: Home / Self Care | Attending: Internal Medicine

## 2024-01-01 ENCOUNTER — Other Ambulatory Visit: Payer: Self-pay

## 2024-01-01 DIAGNOSIS — N179 Acute kidney failure, unspecified: Secondary | ICD-10-CM

## 2024-01-01 DIAGNOSIS — Z794 Long term (current) use of insulin: Secondary | ICD-10-CM | POA: Diagnosis not present

## 2024-01-01 DIAGNOSIS — E1165 Type 2 diabetes mellitus with hyperglycemia: Secondary | ICD-10-CM

## 2024-01-01 DIAGNOSIS — S72001D Fracture of unspecified part of neck of right femur, subsequent encounter for closed fracture with routine healing: Secondary | ICD-10-CM | POA: Diagnosis not present

## 2024-01-01 DIAGNOSIS — S72001S Fracture of unspecified part of neck of right femur, sequela: Secondary | ICD-10-CM

## 2024-01-01 DIAGNOSIS — I10 Essential (primary) hypertension: Secondary | ICD-10-CM | POA: Diagnosis not present

## 2024-01-01 DIAGNOSIS — D62 Acute posthemorrhagic anemia: Secondary | ICD-10-CM | POA: Diagnosis not present

## 2024-01-01 DIAGNOSIS — E785 Hyperlipidemia, unspecified: Secondary | ICD-10-CM | POA: Diagnosis not present

## 2024-01-01 DIAGNOSIS — R0602 Shortness of breath: Secondary | ICD-10-CM | POA: Diagnosis not present

## 2024-01-01 DIAGNOSIS — N189 Chronic kidney disease, unspecified: Secondary | ICD-10-CM

## 2024-01-01 DIAGNOSIS — N1831 Chronic kidney disease, stage 3a: Secondary | ICD-10-CM

## 2024-01-01 HISTORY — PX: INTRAMEDULLARY (IM) NAIL INTERTROCHANTERIC: SHX5875

## 2024-01-01 LAB — IRON AND TIBC
Iron: 20 ug/dL — ABNORMAL LOW (ref 28–170)
Saturation Ratios: 9 % — ABNORMAL LOW (ref 10.4–31.8)
TIBC: 235 ug/dL — ABNORMAL LOW (ref 250–450)
UIBC: 215 ug/dL

## 2024-01-01 LAB — BASIC METABOLIC PANEL WITH GFR
Anion gap: 9 (ref 5–15)
BUN: 35 mg/dL — ABNORMAL HIGH (ref 8–23)
CO2: 24 mmol/L (ref 22–32)
Calcium: 9.4 mg/dL (ref 8.9–10.3)
Chloride: 104 mmol/L (ref 98–111)
Creatinine, Ser: 2.2 mg/dL — ABNORMAL HIGH (ref 0.44–1.00)
GFR, Estimated: 21 mL/min — ABNORMAL LOW (ref >60)
Glucose, Bld: 98 mg/dL (ref 70–99)
Potassium: 4.7 mmol/L (ref 3.5–5.1)
Sodium: 138 mmol/L (ref 135–145)

## 2024-01-01 LAB — CBC
HCT: 28.8 % — ABNORMAL LOW (ref 36.0–46.0)
Hemoglobin: 9.8 g/dL — ABNORMAL LOW (ref 12.0–15.0)
MCH: 31.1 pg (ref 26.0–34.0)
MCHC: 34 g/dL (ref 30.0–36.0)
MCV: 91.4 fL (ref 80.0–100.0)
Platelets: 116 K/uL — ABNORMAL LOW (ref 150–400)
RBC: 3.15 MIL/uL — ABNORMAL LOW (ref 3.87–5.11)
RDW: 13.1 % (ref 11.5–15.5)
WBC: 6.7 K/uL (ref 4.0–10.5)
nRBC: 0 % (ref 0.0–0.2)

## 2024-01-01 LAB — URINALYSIS, COMPLETE (UACMP) WITH MICROSCOPIC
Bilirubin Urine: NEGATIVE
Glucose, UA: >=500 mg/dL — AB
Hgb urine dipstick: NEGATIVE
Ketones, ur: NEGATIVE mg/dL
Leukocytes,Ua: NEGATIVE
Nitrite: NEGATIVE
Protein, ur: 30 mg/dL — AB
Specific Gravity, Urine: 1.015 (ref 1.005–1.030)
pH: 5 (ref 5.0–8.0)

## 2024-01-01 LAB — GLUCOSE, CAPILLARY
Glucose-Capillary: 105 mg/dL — ABNORMAL HIGH (ref 70–99)
Glucose-Capillary: 78 mg/dL (ref 70–99)
Glucose-Capillary: 113 mg/dL — ABNORMAL HIGH (ref 70–99)
Glucose-Capillary: 140 mg/dL — ABNORMAL HIGH (ref 70–99)
Glucose-Capillary: 152 mg/dL — ABNORMAL HIGH (ref 70–99)
Glucose-Capillary: 147 mg/dL — ABNORMAL HIGH (ref 70–99)

## 2024-01-01 LAB — MRSA NEXT GEN BY PCR, NASAL: MRSA by PCR Next Gen: NOT DETECTED

## 2024-01-01 LAB — FERRITIN: Ferritin: 265 ng/mL (ref 11–307)

## 2024-01-01 LAB — ABO/RH: ABO/RH(D): O POS

## 2024-01-01 LAB — VITAMIN D 25 HYDROXY (VIT D DEFICIENCY, FRACTURES): Vit D, 25-Hydroxy: 41.1 ng/mL (ref 30–100)

## 2024-01-01 SURGERY — FIXATION, FRACTURE, INTERTROCHANTERIC, WITH INTRAMEDULLARY ROD
Anesthesia: General | Site: Hip | Laterality: Right

## 2024-01-01 MED ORDER — CHLORHEXIDINE GLUCONATE 0.12 % MT SOLN
15.0000 mL | Freq: Once | OROMUCOSAL | Status: AC
  Administered 2024-01-01: 11:00:00 15 mL via OROMUCOSAL

## 2024-01-01 MED ORDER — PROPOFOL 10 MG/ML IV BOLUS
INTRAVENOUS | Status: DC | PRN
  Administered 2024-01-01: 14:00:00 100 mg via INTRAVENOUS

## 2024-01-01 MED ORDER — ROCURONIUM BROMIDE 100 MG/10ML IV SOLN
INTRAVENOUS | Status: DC | PRN
  Administered 2024-01-01: 14:00:00 50 mg via INTRAVENOUS

## 2024-01-01 MED ORDER — FENTANYL CITRATE (PF) 100 MCG/2ML IJ SOLN
INTRAMUSCULAR | Status: AC
  Filled 2024-01-01: qty 2

## 2024-01-01 MED ORDER — PHENYLEPHRINE HCL-NACL 20-0.9 MG/250ML-% IV SOLN
INTRAVENOUS | Status: DC | PRN
  Administered 2024-01-01: 14:00:00 30 ug/min via INTRAVENOUS

## 2024-01-01 MED ORDER — ADULT MULTIVITAMIN W/MINERALS CH
1.0000 | ORAL_TABLET | Freq: Every day | ORAL | Status: DC
  Administered 2024-01-01 – 2024-01-06 (×6): 1 via ORAL
  Filled 2024-01-01 (×6): qty 1

## 2024-01-01 MED ORDER — PHENOL 1.4 % MT LIQD: 1.0000 | OROMUCOSAL | Status: DC | PRN

## 2024-01-01 MED ORDER — LIDOCAINE HCL (CARDIAC) PF 100 MG/5ML IV SOSY
PREFILLED_SYRINGE | INTRAVENOUS | Status: DC | PRN
  Administered 2024-01-01: 14:00:00 80 mg via INTRAVENOUS

## 2024-01-01 MED ORDER — IPRATROPIUM-ALBUTEROL 0.5-2.5 (3) MG/3ML IN SOLN: 3.0000 mL | Freq: Four times a day (QID) | RESPIRATORY_TRACT | Status: DC | PRN

## 2024-01-01 MED ORDER — CEFAZOLIN SODIUM-DEXTROSE 2-4 GM/100ML-% IV SOLN
2.0000 g | Freq: Four times a day (QID) | INTRAVENOUS | Status: AC
  Administered 2024-01-01 – 2024-01-02 (×2): 2 g via INTRAVENOUS
  Filled 2024-01-01 (×2): qty 100

## 2024-01-01 MED ORDER — DEXTROSE 50 % IV SOLN
INTRAVENOUS | Status: AC
  Filled 2024-01-01: qty 50

## 2024-01-01 MED ORDER — CHLORHEXIDINE GLUCONATE CLOTH 2 % EX PADS
6.0000 | MEDICATED_PAD | Freq: Every day | CUTANEOUS | Status: DC
  Administered 2024-01-01 – 2024-01-05 (×4): 6 via TOPICAL

## 2024-01-01 MED ORDER — ACETAMINOPHEN 10 MG/ML IV SOLN
INTRAVENOUS | Status: AC
  Filled 2024-01-01: qty 100

## 2024-01-01 MED ORDER — PHENYLEPHRINE HCL-NACL 20-0.9 MG/250ML-% IV SOLN
INTRAVENOUS | Status: AC
  Filled 2024-01-01: qty 250

## 2024-01-01 MED ORDER — ONDANSETRON HCL 4 MG/2ML IJ SOLN
4.0000 mg | Freq: Four times a day (QID) | INTRAMUSCULAR | Status: DC | PRN
  Filled 2024-01-01: qty 2

## 2024-01-01 MED ORDER — IPRATROPIUM-ALBUTEROL 0.5-2.5 (3) MG/3ML IN SOLN: 3.0000 mL | Freq: Four times a day (QID) | RESPIRATORY_TRACT | Status: DC

## 2024-01-01 MED ORDER — ONDANSETRON HCL 4 MG/2ML IJ SOLN
INTRAMUSCULAR | Status: DC | PRN
  Administered 2024-01-01: 15:00:00 4 mg via INTRAVENOUS

## 2024-01-01 MED ORDER — CHLORHEXIDINE GLUCONATE 0.12 % MT SOLN
OROMUCOSAL | Status: AC
  Filled 2024-01-01: qty 15

## 2024-01-01 MED ORDER — TRANEXAMIC ACID-NACL 1000-0.7 MG/100ML-% IV SOLN
1000.0000 mg | INTRAVENOUS | Status: AC
  Administered 2024-01-01: 14:00:00 1000 mg via INTRAVENOUS

## 2024-01-01 MED ORDER — TRANEXAMIC ACID-NACL 1000-0.7 MG/100ML-% IV SOLN
INTRAVENOUS | Status: AC
  Filled 2024-01-01: qty 100

## 2024-01-01 MED ORDER — EZETIMIBE 10 MG PO TABS
10.0000 mg | ORAL_TABLET | Freq: Every day | ORAL | Status: DC
  Administered 2024-01-01 – 2024-01-06 (×6): 10 mg via ORAL
  Filled 2024-01-01 (×6): qty 1

## 2024-01-01 MED ORDER — ONDANSETRON HCL 4 MG PO TABS: 4.0000 mg | ORAL_TABLET | Freq: Four times a day (QID) | ORAL | Status: DC | PRN

## 2024-01-01 MED ORDER — LACTATED RINGERS IV SOLN: INTRAVENOUS | Status: DC | PRN

## 2024-01-01 MED ORDER — OXYCODONE HCL 5 MG/5ML PO SOLN: 5.0000 mg | Freq: Once | ORAL | Status: DC | PRN

## 2024-01-01 MED ORDER — CEFAZOLIN SODIUM-DEXTROSE 2-4 GM/100ML-% IV SOLN
INTRAVENOUS | Status: AC
  Filled 2024-01-01: qty 100

## 2024-01-01 MED ORDER — SERTRALINE HCL 50 MG PO TABS
25.0000 mg | ORAL_TABLET | Freq: Every day | ORAL | Status: DC
  Administered 2024-01-01 – 2024-01-06 (×6): 25 mg via ORAL
  Filled 2024-01-01 (×6): qty 1

## 2024-01-01 MED ORDER — METOCLOPRAMIDE HCL 5 MG/ML IJ SOLN: 5.0000 mg | Freq: Three times a day (TID) | INTRAMUSCULAR | Status: DC | PRN

## 2024-01-01 MED ORDER — METOCLOPRAMIDE HCL 5 MG PO TABS: 5.0000 mg | ORAL_TABLET | Freq: Three times a day (TID) | ORAL | Status: DC | PRN

## 2024-01-01 MED ORDER — ACETAMINOPHEN 10 MG/ML IV SOLN
INTRAVENOUS | Status: DC | PRN
  Administered 2024-01-01: 14:00:00 1000 mg via INTRAVENOUS

## 2024-01-01 MED ORDER — SODIUM CHLORIDE 0.9 % IV SOLN: INTRAVENOUS | Status: DC

## 2024-01-01 MED ORDER — BUPIVACAINE-EPINEPHRINE (PF) 0.25% -1:200000 IJ SOLN
INTRAMUSCULAR | Status: AC
  Filled 2024-01-01: qty 30

## 2024-01-01 MED ORDER — DEXAMETHASONE SOD PHOSPHATE PF 10 MG/ML IJ SOLN
INTRAMUSCULAR | Status: DC | PRN
  Administered 2024-01-01: 14:00:00 8 mg via INTRAVENOUS

## 2024-01-01 MED ORDER — INSULIN GLARGINE 100 UNIT/ML ~~LOC~~ SOLN
10.0000 [IU] | Freq: Every day | SUBCUTANEOUS | Status: DC
  Administered 2024-01-01 – 2024-01-02 (×2): 10 [IU] via SUBCUTANEOUS
  Filled 2024-01-01 (×2): qty 0.1

## 2024-01-01 MED ORDER — OXYCODONE HCL 5 MG PO TABS: 5.0000 mg | ORAL_TABLET | Freq: Once | ORAL | Status: DC | PRN

## 2024-01-01 MED ORDER — HYDROCODONE-ACETAMINOPHEN 5-325 MG PO TABS: 1.0000 | ORAL_TABLET | ORAL | Status: DC | PRN

## 2024-01-01 MED ORDER — SUGAMMADEX SODIUM 200 MG/2ML IV SOLN
INTRAVENOUS | Status: DC | PRN
  Administered 2024-01-01: 15:00:00 200 mg via INTRAVENOUS

## 2024-01-01 MED ORDER — ENSURE PLUS HIGH PROTEIN PO LIQD
237.0000 mL | Freq: Two times a day (BID) | ORAL | Status: DC
  Administered 2024-01-02: 09:00:00 237 mL via ORAL

## 2024-01-01 MED ORDER — HYDROCODONE-ACETAMINOPHEN 7.5-325 MG PO TABS: 1.0000 | ORAL_TABLET | ORAL | Status: DC | PRN

## 2024-01-01 MED ORDER — EPHEDRINE SULFATE-NACL 50-0.9 MG/10ML-% IV SOSY
PREFILLED_SYRINGE | INTRAVENOUS | Status: DC | PRN
  Administered 2024-01-01 (×2): 10 mg via INTRAVENOUS

## 2024-01-01 MED ORDER — MORPHINE SULFATE (PF) 2 MG/ML IV SOLN: 0.5000 mg | INTRAVENOUS | Status: DC | PRN

## 2024-01-01 MED ORDER — PANTOPRAZOLE SODIUM 40 MG PO TBEC
40.0000 mg | DELAYED_RELEASE_TABLET | Freq: Every day | ORAL | Status: DC
  Administered 2024-01-01 – 2024-01-06 (×6): 40 mg via ORAL
  Filled 2024-01-01 (×6): qty 1

## 2024-01-01 MED ORDER — LATANOPROST 0.005 % OP SOLN
1.0000 [drp] | Freq: Every day | OPHTHALMIC | Status: DC
  Administered 2024-01-02 – 2024-01-05 (×4): 1 [drp] via OPHTHALMIC
  Filled 2024-01-01: qty 2.5

## 2024-01-01 MED ORDER — METOPROLOL SUCCINATE ER 25 MG PO TB24
12.5000 mg | ORAL_TABLET | Freq: Every day | ORAL | Status: DC
  Administered 2024-01-01 – 2024-01-04 (×4): 12.5 mg via ORAL
  Filled 2024-01-01 (×4): qty 1

## 2024-01-01 MED ORDER — CEFAZOLIN SODIUM-DEXTROSE 2-4 GM/100ML-% IV SOLN
2.0000 g | Freq: Once | INTRAVENOUS | Status: AC
  Administered 2024-01-01: 14:00:00 2 g via INTRAVENOUS

## 2024-01-01 MED ORDER — PHENYLEPHRINE 80 MCG/ML (10ML) SYRINGE FOR IV PUSH (FOR BLOOD PRESSURE SUPPORT)
PREFILLED_SYRINGE | INTRAVENOUS | Status: DC | PRN
  Administered 2024-01-01 (×3): 160 ug via INTRAVENOUS
  Administered 2024-01-01: 15:00:00 80 ug via INTRAVENOUS

## 2024-01-01 MED ORDER — ENOXAPARIN SODIUM 30 MG/0.3ML IJ SOSY
30.0000 mg | PREFILLED_SYRINGE | INTRAMUSCULAR | Status: DC
  Administered 2024-01-02 – 2024-01-06 (×5): 30 mg via SUBCUTANEOUS
  Filled 2024-01-01 (×5): qty 0.3

## 2024-01-01 MED ORDER — DOCUSATE SODIUM 100 MG PO CAPS
100.0000 mg | ORAL_CAPSULE | Freq: Two times a day (BID) | ORAL | Status: DC
  Administered 2024-01-01 – 2024-01-02 (×2): 100 mg via ORAL
  Filled 2024-01-01 (×2): qty 1

## 2024-01-01 MED ORDER — MENTHOL 3 MG MT LOZG: 1.0000 | LOZENGE | OROMUCOSAL | Status: DC | PRN

## 2024-01-01 MED ORDER — DEXTROSE 50 % IV SOLN
25.0000 mL | Freq: Once | INTRAVENOUS | Status: AC
  Administered 2024-01-01: 11:00:00 25 mL via INTRAVENOUS

## 2024-01-01 MED ORDER — FENTANYL CITRATE (PF) 100 MCG/2ML IJ SOLN: 25.0000 ug | INTRAMUSCULAR | Status: DC | PRN

## 2024-01-01 MED ORDER — ROSUVASTATIN CALCIUM 10 MG PO TABS
10.0000 mg | ORAL_TABLET | Freq: Every day | ORAL | Status: DC
  Administered 2024-01-01 – 2024-01-06 (×6): 10 mg via ORAL
  Filled 2024-01-01 (×6): qty 1

## 2024-01-01 MED ADMIN — Sodium Chloride Irrigation Soln 0.9%: 500 mL | @ 15:00:00 | NDC 99999050048

## 2024-01-01 MED ADMIN — Fentanyl Citrate Preservative Free (PF) Inj 100 MCG/2ML: 25 ug | INTRAVENOUS | @ 15:00:00 | NDC 72572017025

## 2024-01-01 MED ADMIN — Bupivacaine Inj 0.25% w/ Epinephrine 1:200000 (PF): 20 mL | @ 15:00:00 | NDC 63323046802

## 2024-01-01 MED ADMIN — Fentanyl Citrate Preservative Free (PF) Inj 100 MCG/2ML: 50 ug | INTRAVENOUS | @ 14:00:00 | NDC 72572017025

## 2024-01-01 SURGICAL SUPPLY — 42 items
BIT DRILL CALIBRATED 4.2 (BIT) IMPLANT
BIT DRILL CANN 16 HIP (BIT) IMPLANT
BIT DRILL CANN STP 6/9 HIP (BIT) IMPLANT
BIT DRILL TAPERED 10 (BIT) IMPLANT
BLADE TFNA HELICAL 95 STRL (Anchor) IMPLANT
BNDG COHESIVE 6X5 TAN ST LF (GAUZE/BANDAGES/DRESSINGS) ×2 IMPLANT
CHLORAPREP W/TINT 26 (MISCELLANEOUS) ×1 IMPLANT
DERMABOND ADVANCED .7 DNX12 (GAUZE/BANDAGES/DRESSINGS) ×1 IMPLANT
DRAPE C-ARM XRAY 36X54 (DRAPES) ×1 IMPLANT
DRAPE C-ARMOR (DRAPES) IMPLANT
DRAPE SHEET LG 3/4 BI-LAMINATE (DRAPES) ×1 IMPLANT
DRSG OPSITE POSTOP 3X4 (GAUZE/BANDAGES/DRESSINGS) IMPLANT
DRSG OPSITE POSTOP 4X6 (GAUZE/BANDAGES/DRESSINGS) IMPLANT
ELECTRODE REM PT RTRN 9FT ADLT (ELECTROSURGICAL) IMPLANT
GLOVE PI ORTHO PRO STRL 7.5 (GLOVE) ×2 IMPLANT
GLOVE SURG SYN 7.5 PF PI (GLOVE) ×1 IMPLANT
GOWN SRG XL LONG LVL 3 NONREIN (GOWNS) ×1 IMPLANT
GOWN SRG XL LVL 3 NONREINFORCE (GOWNS) ×1 IMPLANT
GOWN STRL REUS W/ TWL LRG LVL3 (GOWN DISPOSABLE) ×1 IMPLANT
GUIDEWIRE 3.2X400 (WIRE) IMPLANT
HANDLE YANKAUER SUCT OPEN TIP (MISCELLANEOUS) ×1 IMPLANT
KIT PATIENT CARE HANA TABLE (KITS) ×1 IMPLANT
KIT TURNOVER CYSTO (KITS) ×1 IMPLANT
MANIFOLD NEPTUNE II (INSTRUMENTS) ×1 IMPLANT
MAT ABSORB FLUID 56X50 GRAY (MISCELLANEOUS) ×1 IMPLANT
NAIL TROCH FIX 10X170 130 (Nail) IMPLANT
NDL HYPO 21X1.5 SAFETY (NEEDLE) ×1 IMPLANT
NEEDLE HYPO 21X1.5 SAFETY (NEEDLE) ×1 IMPLANT
NS IRRIG 500ML POUR BTL (IV SOLUTION) ×1 IMPLANT
PACK HIP COMPR (MISCELLANEOUS) ×1 IMPLANT
PAD ARMBOARD POSITIONER FOAM (MISCELLANEOUS) ×1 IMPLANT
PENCIL SMOKE EVACUATOR (MISCELLANEOUS) ×1 IMPLANT
SCREW LOCK STAR 5X34 (Screw) IMPLANT
SLEEVE SCD COMPRESS KNEE MED (STOCKING) ×1 IMPLANT
SOLN STERILE WATER BTL 1000 ML (IV SOLUTION) ×1 IMPLANT
SUT VIC AB 1 CT1 36 (SUTURE) ×1 IMPLANT
SUT VIC AB 2-0 CT2 27 (SUTURE) ×1 IMPLANT
SUTURE STRATA SPIR 4-0 18 (SUTURE) ×1 IMPLANT
SYR 20ML LL LF (SYRINGE) ×1 IMPLANT
TAPE MICROFOAM 4IN (TAPE) IMPLANT
TRAP FLUID SMOKE EVACUATOR (MISCELLANEOUS) IMPLANT
TRAY FOLEY SLVR 16FR LF STAT (SET/KITS/TRAYS/PACK) IMPLANT

## 2024-01-01 NOTE — Anesthesia Procedure Notes (Signed)
 Procedure Name: Intubation Date/Time: 01/01/2024 2:02 PM  Performed by: Gillermo Spruce I, CRNAPre-anesthesia Checklist: Patient identified, Patient being monitored, Timeout performed, Emergency Drugs available and Suction available Patient Re-evaluated:Patient Re-evaluated prior to induction Oxygen Delivery Method: Circle system utilized Preoxygenation: Pre-oxygenation with 100% oxygen Induction Type: IV induction Ventilation: Mask ventilation without difficulty Laryngoscope Size: 3 and McGrath Grade View: Grade I Tube type: Oral Tube size: 7.0 mm Number of attempts: 1 Airway Equipment and Method: Stylet and Video-laryngoscopy Placement Confirmation: ETT inserted through vocal cords under direct vision, positive ETCO2 and breath sounds checked- equal and bilateral Secured at: 20 cm Tube secured with: Tape Dental Injury: Teeth and Oropharynx as per pre-operative assessment

## 2024-01-01 NOTE — Progress Notes (Signed)
 Initial Nutrition Assessment  DOCUMENTATION CODES:   Not applicable  INTERVENTION:   Ensure Plus High Protein po BID, each supplement provides 350 kcal and 20 grams of protein  MVI po daily   Pt at refeed risk; recommend monitor potassium, magnesium and phosphorus labs daily until stable  Daily weights   NUTRITION DIAGNOSIS:   Increased nutrient needs related to post-op healing, hip fracture as evidenced by estimated needs.  GOAL:   Patient will meet greater than or equal to 90% of their needs  MONITOR:   PO intake, Supplement acceptance, Labs, Weight trends, Skin, I & O's  REASON FOR ASSESSMENT:   Consult Hip fracture protocol  ASSESSMENT:   89 y/o female with h/o HTN, HLD, DM, CKD, CAD s/p CABG x 3, anxiety, depression, chronic pain and GERD who is admitted with AKI and R hip fracture after fall.  RD unable to see pt as pt in surgery at the time of RD visit today. Pt NPO for planned hip repair. RD will add supplements and vitamins to help pt meet her estimated needs. RD will follow up to obtain history and exam at follow up.   Medications reviewed and include: aspirin , heparin , insulin , MVI, protonix , senokot  Labs reviewed: K 4.7 wnl, BUN 35(H), creat 2.20(H) Hgb 9.8(L), Hct 28.8(L) Cbgs- 113, 78, 105 x 24 hrs  AIC 8.6(H)- 11/13  UOP-   NUTRITION - FOCUSED PHYSICAL EXAM: Unable to perform at this time   Diet Order:   Diet Order             Diet Carb Modified Room service appropriate? Yes  Diet effective now                  EDUCATION NEEDS:   Not appropriate for education at this time  Skin:  Skin Assessment: Reviewed RN Assessment (incision R hip)  Last BM:  12/15  Height:   Ht Readings from Last 1 Encounters:  01/01/24 5' 2 (1.575 m)    Weight:   Wt Readings from Last 1 Encounters:  01/01/24 54 kg    Ideal Body Weight:  50 kg  BMI:  Body mass index is 21.77 kg/m.  Estimated Nutritional Needs:   Kcal:   1400-1600kcal/day  Protein:  70-80g/day  Fluid:  1.3-1.5L/day  Augustin Shams MS, RD, LDN If unable to be reached, please send secure chat to RD inpatient available from 8:00a-4:00p daily

## 2024-01-01 NOTE — Plan of Care (Signed)

## 2024-01-01 NOTE — Anesthesia Preprocedure Evaluation (Signed)
 Anesthesia Evaluation  Patient identified by MRN, date of birth, ID band Patient awake    Reviewed: Allergy & Precautions, NPO status , Patient's Chart, lab work & pertinent test results  Airway Mallampati: III  TM Distance: >3 FB Neck ROM: full    Dental  (+) Chipped   Pulmonary neg pulmonary ROS   Pulmonary exam normal        Cardiovascular hypertension, + CAD and + CABG  Normal cardiovascular exam     Neuro/Psych  PSYCHIATRIC DISORDERS Anxiety Depression    CVA, No Residual Symptoms    GI/Hepatic Neg liver ROS,GERD  Medicated and Controlled,,  Endo/Other  negative endocrine ROSdiabetes    Renal/GU      Musculoskeletal   Abdominal   Peds  Hematology negative hematology ROS (+)   Anesthesia Other Findings Past Medical History: 03/16/2022: Acute bilateral low back pain with right-sided sciatica 03/16/2022: Acute midline thoracic back pain 03/09/2019: Acute pain of left foot No date: CAD (coronary artery disease) 11/22/2011: History of nuclear stress test     Comment:  bruce myoview; normal pattern of perfusio; post-stress               EF 76%; low risk scan No date: Hyperlipidemia No date: Hypertension 04/16/2007: S/P CABG x 6     Comment:  LIMA to LAD, SVG to ramus intermedius, SVG to OM1 & OM2,              SVG to acute marginal, SVG to distal RCA No date: Type 2 diabetes mellitus (HCC)  Past Surgical History: 05/07/2007: CARDIAC CATHETERIZATION     Comment:  EF 55%, focal mild hypocontractility in mid-distal               inferior wall & mid posterolateral wall; severe               multivessel CAD - susequent CABGx6 (Dr. IVAR Sor) 05/09/2007: CORONARY ARTERY BYPASS GRAFT     Comment:  LIMA to LAD, veing to intermediate; SVG to OM1 & OM2;               SVG to acute marginal & distal RCA (Dr. Kerrin) 02/13/2010: TRANSTHORACIC ECHOCARDIOGRAM     Comment:  EF =>55%, vigorous contraction EF 65%; LA  mild-mod               dilated; IV normal diameter - normal CVP; trace MR; mild               TR; trace AV regurg  BMI    Body Mass Index: 21.77 kg/m      Reproductive/Obstetrics negative OB ROS                              Anesthesia Physical Anesthesia Plan  ASA: 3  Anesthesia Plan: General ETT   Post-op Pain Management:    Induction: Intravenous  PONV Risk Score and Plan: 3 and Ondansetron  and Dexamethasone   Airway Management Planned: Oral ETT  Additional Equipment:   Intra-op Plan:   Post-operative Plan: Extubation in OR  Informed Consent: I have reviewed the patients History and Physical, chart, labs and discussed the procedure including the risks, benefits and alternatives for the proposed anesthesia with the patient or authorized representative who has indicated his/her understanding and acceptance.     Dental Advisory Given  Plan Discussed with: Anesthesiologist, CRNA and Surgeon  Anesthesia Plan Comments: (Patient consented for risks of anesthesia  including but not limited to:  - adverse reactions to medications - damage to eyes, teeth, lips or other oral mucosa - nerve damage due to positioning  - sore throat or hoarseness - Damage to heart, brain, nerves, lungs, other parts of body or loss of life  Patient voiced understanding and assent.)        Anesthesia Quick Evaluation

## 2024-01-01 NOTE — Interval H&P Note (Signed)
 Patient history and physical updated. Consent reviewed including risks, benefits, and alternatives to surgery. Patient and family agrees with above plan to proceed with right femur intramedullary nail.

## 2024-01-01 NOTE — Assessment & Plan Note (Signed)
 Resolved

## 2024-01-01 NOTE — Assessment & Plan Note (Signed)
 With blood pressure on the lower side will hold losartan  and decrease dose of metoprolol 

## 2024-01-01 NOTE — Assessment & Plan Note (Signed)
 Last hemoglobin A1c 8.6.  Sugars starting to elevate.  Will increase Lantus  insulin  to 18 units daily.  Continue sliding scale.

## 2024-01-01 NOTE — Plan of Care (Signed)
  Problem: Education: Goal: Knowledge of General Education information will improve Description: Including pain rating scale, medication(s)/side effects and non-pharmacologic comfort measures Outcome: Progressing   Problem: Clinical Measurements: Goal: Diagnostic test results will improve Outcome: Progressing   Problem: Coping: Goal: Level of anxiety will decrease Outcome: Progressing   Problem: Pain Managment: Goal: General experience of comfort will improve and/or be controlled Outcome: Progressing

## 2024-01-01 NOTE — Progress Notes (Signed)
 OT Cancellation Note  Patient Details Name: Laura Ferguson MRN: [id number] DOB: Sep 21, 1932   Cancelled Treatment:    Reason Eval/Treat Not Completed: Patient at procedure or test/ unavailable. Patient has a comminuted intertrochanteric right femur fracture and currently in pre-op for scheduled surgery today. OT to evaluate after procedure completed.   Izetta Claude, MS, OTR/L , CBIS ascom [phone]  01/01/2024, 11:15 AM

## 2024-01-01 NOTE — Assessment & Plan Note (Signed)
 Patient n.p.o. for operating room today.  Pain control.  Family thinking they want to go home rather than rehab will see how she does after surgery with physical therapy.

## 2024-01-01 NOTE — Evaluation (Signed)
 Physical Therapy Evaluation Patient Details Name: Laura Ferguson MRN: [id number] DOB: 09-29-1932 Today's Date: 01/01/2024  History of Present Illness  Laura Ferguson is a 89 y.o. female past medical history of hypertension, diabetes, CAD, and GERD who presented to the emergency room while slipping trying out of a vehicle and fell backwards onto her right hip and struck the back of her head.  She denies loss of consciousness.  She reported immediate pain in her right hip and was unable to get up or bear weight.  She reports the pain radiates down her thigh towards her knee.  Denies any pain in her tibia calf ankle or foot.  Denies any pre-existing right hip pain does report pre-existing right knee pain secondary to arthritis.  Denies any numbness or tingling in the leg at this time.  Clinical Impression  Patient noted to be in supine position at PT arrival in room, for an initial PT evaluation due to a decline in functional status, with baseline mobility reported as independent, and currently requiring modA for bed mobility; keeping baseline assessment within pt pain limits. The patient is A&O x 4, presenting with good willingness to work with PT and goals of going home with family in room. The patient resides in a house and lives alone with family/friend support. There are 3  and inside the residence. Recommended skilled PT will address safety, mobility, and discharge planning.      If plan is discharge home, recommend the following: A lot of help with walking and/or transfers;A lot of help with bathing/dressing/bathroom;Help with stairs or ramp for entrance   Can travel by private vehicle        Equipment Recommendations Rolling walker (2 wheels)  Recommendations for Other Services       Functional Status Assessment Patient has had a recent decline in their functional status and demonstrates the ability to make significant improvements in function in a reasonable and predictable amount of time.      Precautions / Restrictions Precautions Precautions: Fall Restrictions Weight Bearing Restrictions Per Provider Order: Yes      Mobility  Bed Mobility Overal bed mobility: Needs Assistance Bed Mobility: Sidelying to Sit   Sidelying to sit: Min assist, Mod assist, Used rails            Transfers                        Ambulation/Gait                  Stairs            Wheelchair Mobility     Tilt Bed    Modified Rankin (Stroke Patients Only)       Balance Overall balance assessment: Needs assistance, Mild deficits observed, not formally tested Sitting-balance support: Feet supported, Bilateral upper extremity supported Sitting balance-Leahy Scale: Fair                                       Pertinent Vitals/Pain Pain Assessment Pain Assessment: Faces Faces Pain Scale: Hurts little more Pain Location: R Hip Pain Descriptors / Indicators: Aching, Throbbing Pain Intervention(s): Limited activity within patient's tolerance, Monitored during session, Repositioned    Home Living Family/patient expects to be discharged to:: Private residence Living Arrangements: Alone Available Help at Discharge: Available PRN/intermittently;Family Type of Home: House Home Access: Stairs to enter Entrance  Stairs-Rails: Can reach both;Left;Right Entrance Stairs-Number of Steps: 3   Home Layout: One level Home Equipment: Shower seat;Grab bars - tub/shower;Hand held shower head      Prior Function Prior Level of Function : Independent/Modified Independent             Mobility Comments: independent ADLs Comments: independent     Extremity/Trunk Assessment   Upper Extremity Assessment Upper Extremity Assessment: Generalized weakness    Lower Extremity Assessment Lower Extremity Assessment: Generalized weakness;RLE deficits/detail RLE Deficits / Details: R hip pain       Communication   Communication Communication:  No apparent difficulties    Cognition Arousal: Alert Behavior During Therapy: WFL for tasks assessed/performed   PT - Cognitive impairments: No apparent impairments                         Following commands: Intact       Cueing Cueing Techniques: Verbal cues     General Comments      Exercises     Assessment/Plan    PT Assessment Patient needs continued PT services  PT Problem List Decreased strength;Decreased activity tolerance;Decreased mobility       PT Treatment Interventions DME instruction;Stair training;Functional mobility training;Therapeutic activities;Therapeutic exercise;Neuromuscular re-education;Balance training;Patient/family education    PT Goals (Current goals can be found in the Care Plan section)  Acute Rehab PT Goals Patient Stated Goal: Pt and family in room wants pt. to go to home health PT Goal Formulation: With patient Time For Goal Achievement: 01/15/24 Potential to Achieve Goals: Fair    Frequency Min 2X/week     Co-evaluation               AM-PAC PT 6 Clicks Mobility  Outcome Measure Help needed turning from your back to your side while in a flat bed without using bedrails?: A Lot Help needed moving from lying on your back to sitting on the side of a flat bed without using bedrails?: A Lot Help needed moving to and from a bed to a chair (including a wheelchair)?: A Lot Help needed standing up from a chair using your arms (e.g., wheelchair or bedside chair)?: A Lot Help needed to walk in hospital room?: A Lot Help needed climbing 3-5 steps with a railing? : A Lot 6 Click Score: 12    End of Session   Activity Tolerance: Other (comment) (limited WL to assess baseline) Patient left: in bed;with bed alarm set;with family/visitor present Nurse Communication: Mobility status PT Visit Diagnosis: Other abnormalities of gait and mobility (R26.89)    Time: 9055-8985 PT Time Calculation (min) (ACUTE ONLY): 30  min   Charges:   PT Evaluation $PT Eval Low Complexity: 1 Low   PT General Charges $$ ACUTE PT VISIT: 1 Visit         Laura Ferguson DPT, PT    Laura Ferguson 01/01/2024, 10:28 AM

## 2024-01-01 NOTE — Assessment & Plan Note (Signed)
On Crestor and Zetia

## 2024-01-01 NOTE — Assessment & Plan Note (Signed)
 AKI on CKD stage IIIb.  Gentle fluids.  Creatinine up to 2.2 will is 1.2 on presentation.

## 2024-01-01 NOTE — Op Note (Signed)
 Patient Name: Laura Ferguson  FMW:[id number]  Pre-Operative Diagnosis: Right hip Intertrochanteric fracture  Post-Operative Diagnosis: (same)  Procedure: Right Hip Intramedullary nail   Components/Implants: Nail:TNFA 10mm x170 x130 deg  Lag Blade :95mm Locking Screw:7mm  Date of Surgery: 01/01/2024  Surgeon: Arthea Sheer MD  Assistant: None  Anesthesiologist: Leavy  Anesthesia: General   EBL: 100cc  Complications: None   Brief history: The patient is a 89 year old female who presented to the [facility] emergency room after a fall and found to have a  right hip intertrochanteric fracture.  The patient was admitted by the medical team and optimized for surgery.  A thorough discussion was had with the patient and family about the risks and benefits of surgical intervention for their hip fracture as definitive treatment.  The patient and family opted to proceed with the operation.  All preoperative films were reviewed and an appropriate surgical plan was made prior to surgery.   Description of procedure: The patient was brought to the operating room where laterality was confirmed by all those present to be the right side.  The patient was administered anesthesia on a stretcher prior to being moved supine on the operating room table. Patient was given an intravenous dose of antibiotics for surgical prophylaxis and TXA.  All bony prominences and extremities were well padded and the patient was securely attached to the table boots, a perineal post was placed and the patient had a safety strap placed.  Surgical site was prepped with alcohol and chlorhexidine . The surgical site over the hip was and draped in typical sterile fashion with multiple layers of adhesive and nonadhesive drapes.  The incision site was marked out with a sterile marker under fluoroscopic guidance.    A surgical timeout was then called with participation of all staff in the room the patient was  then a confirmed again and laterality confirmed. The hip fracture was reduced through indirect measures with traction and rotation of the leg on the fracture table. After an acceptable reduction was obtained on AP and lateral images the procedure started.  An incision was made just proximal to the greater trochanter through the skin subcutaneous tissues and an incision was made in the glut max fascia.  A guidewire was placed through the greater trochanter at the tip under x-ray guidance into the intertrochanteric region.  The position of this wire was assessed on AP and lateral fluoroscopic images to ensure position.  An opening reamer was used to create access at the tip of the greater trochanter under fluoroscopic guidance.   A size 10 x170 nail was advanced through the reamed hole in the proximal femur and seated within the femur to an appropriate depth under fluoroscopic guidance.  The aiming jig was used to mark an incision site for a lag blade to the femoral head which was then incised with a scalpel.  A wire was then advanced through the lateral cortex of the femur into the center of the femoral head on both AP and lateral fluoroscopic imaging stopping at the subchondral bone without penetration of the femoral head.  This wire was then used to measure for a lag screw and a size 95mm lag blade was inserted into the femoral head through the lateral cortex of the femur and the nail under fluoroscopic guidance.  The setscrew was then tightened in the proximal part of the nail and engaged with the lag blade with a small amount of play left so as to allow compression  of the fracture.   The lag screw driver was removed and the aiming jig was switched for the distal interlocking screw.  A drill was used to drill a hole for the distal interlocking screw under fluoroscopic guidance and a size 34 screw was placed.  The intramedullary nail was assessed on AP and lateral fluoroscopic guidance prior to removal of the  aiming jig.  Final fluoroscopic x-rays were then taken after removal of the jig.  The nail was found to be in appropriate position on AP and lateral imaging with appropriate lengths of both the lag screw in the distal locking screw.  The fascia was closed with 0 Vicryl interrupted figure-of-eight sutures.  The subcutaneous tissues were closed with 2-0 Vicryl and the skin closed with 3-0 Monocryl and Dermabond.  Sterile dressings were applied to the incisions.   The patient was awoken from anesthesia transferred off of the operating room table onto a hospital bed.  The patient had a good pulse postoperatively in the foot . the patient was then transferred to the PACU in stable condition.

## 2024-01-01 NOTE — Progress Notes (Signed)
 Progress Note   Patient: Laura Ferguson FMW:[id number] DOB: 06/27/1932 DOA: 12/31/2023     1 DOS: the patient was seen and examined on 01/01/2024   Brief hospital course: 89 y.o. female with medical history significant of CAD status post CABG in 2009, hypertension, mild neurocognitive impairment, glaucoma and type 2 diabetes.   Patient states that she was getting out of the car after returning home from an appointment with ophthalmology for treatment of her glaucoma.  She states that she was getting out of the car and that she was holding onto the car door but it moved away from her causing her to fall backwards onto concrete, striking the back of her head and R hip. She denies LOC or dizziness/lightheadedness. She noted immediate pain in her R hip and could not bear pain. She was brought in via EMS.    She denies any difficulty eating or drinking, chest pain or palpitations, dyspnea, constipation or diarrhea.   Of note, patient per chart review has a history of vertigo and follows with ENT.  She has also had some balance issues.  She recently saw neurology for this  01/01/2024.  Patient seen this morning prior to the operating room.   Assessment and Plan: * Closed fracture of right hip [facility]) Patient n.p.o. for operating room today.  Pain control.  Family thinking they want to go home rather than rehab will see how she does after surgery with physical therapy.  Acute kidney injury superimposed on CKD AKI on CKD stage IIIb.  Gentle fluids.  Creatinine up to 2.2 will is 1.2 on presentation.  Uncontrolled type 2 diabetes mellitus with hyperglycemia, with long-term current use of insulin  (HCC) Last hemoglobin A1c 8.6.  Decrease insulin  today secondary to going to operating room.  Essential hypertension With blood pressure on the lower side will hold losartan  and decrease dose of metoprolol   Hyperlipidemia, unspecified On Crestor  and Zetia   Shortness of breath Will give nebulizer  treatment.  Chest x-ray negative.        Subjective: Family noticed patient a little more short of breath.  Complains of some pain in the hip.  Had a fall while trying to get in the house.  Normally does not walk without a device.  Found to have a hip fracture.  Physical Exam: Vitals:   01/01/24 0738 01/01/24 0801 01/01/24 0847 01/01/24 1032  BP: (!) 101/59 (!) 91/49  (!) 119/52  Pulse: 90 71 65 78  Resp: 18 19  18   Temp: 99 F (37.2 C) 98.1 F (36.7 C)  98.4 F (36.9 C)  TempSrc:    Tympanic  SpO2: 100% 98%  100%  Weight:    54 kg  Height:    5' 2 (1.575 m)   Physical Exam HENT:     Head: Normocephalic.     Mouth/Throat:     Pharynx: No oropharyngeal exudate.  Eyes:     General: Lids are normal.  Cardiovascular:     Rate and Rhythm: Normal rate and regular rhythm.     Heart sounds: Normal heart sounds, S1 normal and S2 normal.  Pulmonary:     Breath sounds: Examination of the right-lower field reveals decreased breath sounds and rhonchi. Examination of the left-lower field reveals decreased breath sounds and rhonchi. Decreased breath sounds and rhonchi present. No wheezing or rales.  Abdominal:     Palpations: Abdomen is soft.     Tenderness: There is no abdominal tenderness.  Musculoskeletal:     Right  lower leg: No swelling.     Left lower leg: No swelling.  Skin:    General: Skin is warm.     Findings: No rash.  Neurological:     Mental Status: She is alert and oriented to person, place, and time.     Data Reviewed: Creatinine 2.2, electrolytes normal range, ferritin 265, hemoglobin 9.88, platelet count 116, white blood cell count 6.7, chest x-ray negative, intertrochanteric fracture of the proximal right femur with varus angulation no hip dislocation and probable nondisplaced fracture of left superior pubic rami  Family Communication: Family at bedside  Disposition: Status is: Inpatient Remains inpatient appropriate because: Operating room today  Planned  Discharge Destination: Family may want to take home but PT will likely recommend rehab.    Time spent: 28 minutes  Author: Charlie Patterson, MD 01/01/2024 11:52 AM  For on call review [email or web address].

## 2024-01-01 NOTE — Progress Notes (Signed)
 PHARMACIST - PHYSICIAN COMMUNICATION  CONCERNING:  Enoxaparin  (Lovenox ) for DVT Prophylaxis    RECOMMENDATION: Patient was prescribed enoxaprin 40mg  q24 hours for VTE prophylaxis.   Filed Weights   12/31/23 1322 01/01/24 1032  Weight: 54 kg (119 lb) 54 kg (119 lb)    Body mass index is 21.77 kg/m.  Estimated Creatinine Clearance: 13.7 mL/min (A) (by C-G formula based on SCr of 2.2 mg/dL (H)).   Patient is candidate for enoxaparin  30mg  every 24 hours based on CrCl <37ml/min or Weight <45kg  DESCRIPTION: Pharmacy has adjusted enoxaparin  dose per [facility] policy.  Patient is now receiving enoxaparin  30 mg every 24 hours    Lum VEAR Mania, PharmD Clinical Pharmacist  01/01/2024 4:14 PM

## 2024-01-01 NOTE — Transfer of Care (Signed)
 Immediate Anesthesia Transfer of Care Note  Patient: Laura Ferguson  Procedure(s) Performed: FIXATION, FRACTURE, INTERTROCHANTERIC, WITH INTRAMEDULLARY ROD (Right: Hip)  Patient Location: PACU  Anesthesia Type:General  Level of Consciousness: awake and alert   Airway & Oxygen Therapy: Patient Spontanous Breathing and Patient connected to face mask oxygen  Post-op Assessment: Report given to RN and Post -op Vital signs reviewed and stable  Post vital signs: stable  Last Vitals:  Vitals Value Taken Time  BP 144/64 01/01/24 15:18  Temp    Pulse 97 01/01/24 15:21  Resp 19 01/01/24 15:21  SpO2 100 % 01/01/24 15:21  Vitals shown include unfiled device data.  Last Pain:  Vitals:   01/01/24 1032  TempSrc: Tympanic  PainSc:          Complications: No notable events documented.

## 2024-01-02 ENCOUNTER — Inpatient Hospital Stay

## 2024-01-02 ENCOUNTER — Encounter: Payer: Self-pay | Admitting: Orthopedic Surgery

## 2024-01-02 ENCOUNTER — Telehealth: Payer: Self-pay | Admitting: *Deleted

## 2024-01-02 DIAGNOSIS — S72001S Fracture of unspecified part of neck of right femur, sequela: Secondary | ICD-10-CM | POA: Diagnosis not present

## 2024-01-02 DIAGNOSIS — N179 Acute kidney failure, unspecified: Secondary | ICD-10-CM | POA: Diagnosis not present

## 2024-01-02 DIAGNOSIS — I10 Essential (primary) hypertension: Secondary | ICD-10-CM | POA: Diagnosis not present

## 2024-01-02 DIAGNOSIS — D62 Acute posthemorrhagic anemia: Secondary | ICD-10-CM | POA: Diagnosis not present

## 2024-01-02 DIAGNOSIS — S72001D Fracture of unspecified part of neck of right femur, subsequent encounter for closed fracture with routine healing: Secondary | ICD-10-CM | POA: Diagnosis not present

## 2024-01-02 LAB — BASIC METABOLIC PANEL WITH GFR
Anion gap: 12 (ref 5–15)
BUN: 42 mg/dL — ABNORMAL HIGH (ref 8–23)
CO2: 20 mmol/L — ABNORMAL LOW (ref 22–32)
Calcium: 8.2 mg/dL — ABNORMAL LOW (ref 8.9–10.3)
Chloride: 105 mmol/L (ref 98–111)
Creatinine, Ser: 1.99 mg/dL — ABNORMAL HIGH (ref 0.44–1.00)
GFR, Estimated: 23 mL/min — ABNORMAL LOW (ref >60)
Glucose, Bld: 229 mg/dL — ABNORMAL HIGH (ref 70–99)
Potassium: 5 mmol/L (ref 3.5–5.1)
Sodium: 136 mmol/L (ref 135–145)

## 2024-01-02 LAB — CBC
HCT: 23.2 % — ABNORMAL LOW (ref 36.0–46.0)
Hemoglobin: 7.7 g/dL — ABNORMAL LOW (ref 12.0–15.0)
MCH: 31 pg (ref 26.0–34.0)
MCHC: 33.2 g/dL (ref 30.0–36.0)
MCV: 93.5 fL (ref 80.0–100.0)
Platelets: 88 K/uL — ABNORMAL LOW (ref 150–400)
RBC: 2.48 MIL/uL — ABNORMAL LOW (ref 3.87–5.11)
RDW: 12.8 % (ref 11.5–15.5)
WBC: 8.4 K/uL (ref 4.0–10.5)
nRBC: 0 % (ref 0.0–0.2)

## 2024-01-02 LAB — GASTROINTESTINAL PANEL BY PCR, STOOL (REPLACES STOOL CULTURE)

## 2024-01-02 LAB — GLUCOSE, CAPILLARY
Glucose-Capillary: 220 mg/dL — ABNORMAL HIGH (ref 70–99)
Glucose-Capillary: 218 mg/dL — ABNORMAL HIGH (ref 70–99)
Glucose-Capillary: 200 mg/dL — ABNORMAL HIGH (ref 70–99)
Glucose-Capillary: 162 mg/dL — ABNORMAL HIGH (ref 70–99)

## 2024-01-02 LAB — MAGNESIUM: Magnesium: 2 mg/dL (ref 1.7–2.4)

## 2024-01-02 LAB — C DIFFICILE QUICK SCREEN W PCR REFLEX
C Diff antigen: NEGATIVE
C Diff interpretation: NOT DETECTED
C Diff toxin: NEGATIVE

## 2024-01-02 LAB — PHOSPHORUS: Phosphorus: 3.7 mg/dL (ref 2.5–4.6)

## 2024-01-02 LAB — PREPARE RBC (CROSSMATCH)

## 2024-01-02 MED ORDER — SODIUM CHLORIDE 0.9% IV SOLUTION: Freq: Once | INTRAVENOUS | Status: AC

## 2024-01-02 MED ORDER — INSULIN GLARGINE 100 UNIT/ML ~~LOC~~ SOLN
15.0000 [IU] | Freq: Every day | SUBCUTANEOUS | Status: DC
  Administered 2024-01-03: 15 [IU] via SUBCUTANEOUS
  Filled 2024-01-02: qty 0.15

## 2024-01-02 MED ORDER — GLUCERNA SHAKE PO LIQD
237.0000 mL | Freq: Three times a day (TID) | ORAL | Status: DC
  Administered 2024-01-03 – 2024-01-06 (×5): 237 mL via ORAL

## 2024-01-02 NOTE — TOC CM/SW Note (Signed)
 Transition of Care (TOC) CM/SW Note   The patient has a mobility limitation that significantly impairs their ability to participant in one or more mobility-related activities of daily living in the home. The patient is able to safely use the walker. The functional mobility deficit can be sufficiently resolved by use of a walker.

## 2024-01-02 NOTE — Progress Notes (Signed)
 Progress Note   Patient: Laura Ferguson FMW:[id number] DOB: [birth year] DOA: 12/31/2023     2 DOS: the patient was seen and examined on 01/02/2024   Brief hospital course: 89 y.o. female with medical history significant of CAD status post CABG in 2009, hypertension, mild neurocognitive impairment, glaucoma and type 2 diabetes.   Patient states that she was getting out of the car after returning home from an appointment with ophthalmology for treatment of her glaucoma.  She states that she was getting out of the car and that she was holding onto the car door but it moved away from her causing her to fall backwards onto concrete, striking the back of her head and R hip. She denies LOC or dizziness/lightheadedness. She noted immediate pain in her R hip and could not bear pain. She was brought in via EMS.    She denies any difficulty eating or drinking, chest pain or palpitations, dyspnea, constipation or diarrhea.   Of note, patient per chart review has a history of vertigo and follows with ENT.  She has also had some balance issues.  She recently saw neurology for this  01/01/2024.  Patient seen this morning prior to the operating room. 12/18.  Patient's hemoglobin today dropped down to 7.7.  Patient felt a little lightheaded with moving around with physical therapy.  Will give a unit of blood case discussed with orthopedic surgery.  Assessment and Plan: * Closed fracture of right hip [facility]) Dr. Milta then took to the operating room on 12/17 for right hip intramedullary nail for right hip fracture.  Postoperative anemia due to acute blood loss Patient's hemoglobin down to 7.7.  Patient feeling lightheaded with working with physical therapy.  Will transfuse 1 unit of packed red blood cells today.  Benefits and risk of transfusion explained to patient and family.  Acute kidney injury superimposed on CKD AKI on CKD stage IIIb.  Creatinine 1.2 on admission and up to 2.2 yesterday.  Today's creatinine  1.99.  Transfuse 1 unit of packed red blood cells today.  Uncontrolled type 2 diabetes mellitus with hyperglycemia, with long-term current use of insulin  (HCC) Last hemoglobin A1c 8.6.  Sugars starting to elevate.  Will increase Lantus  insulin  to 15 units daily.  Continue sliding scale.  Essential hypertension With blood pressure on the lower side will hold losartan  and decrease dose of metoprolol   Hyperlipidemia, unspecified On Crestor  and Zetia   Shortness of breath Resolved        Subjective: Patient came in with fall and found to have a right hip fracture.  Physical Exam: Vitals:   01/02/24 0743 01/02/24 1013 01/02/24 1155 01/02/24 1211  BP: (!) 114/56 112/76 (!) 115/51 (!) 99/46  Pulse: 98  97 100  Resp: 17  18 18   Temp: 98.4 F (36.9 C)  99.1 F (37.3 C) 98.8 F (37.1 C)  TempSrc:   Oral Oral  SpO2: 98%  98%   Weight:      Height:       Physical Exam HENT:     Head: Normocephalic.     Mouth/Throat:     Pharynx: No oropharyngeal exudate.  Eyes:     General: Lids are normal.  Cardiovascular:     Rate and Rhythm: Normal rate and regular rhythm.     Heart sounds: Normal heart sounds, S1 normal and S2 normal.  Pulmonary:     Breath sounds: Examination of the right-lower field reveals decreased breath sounds. Examination of the left-lower field reveals decreased  breath sounds. Decreased breath sounds present. No wheezing, rhonchi or rales.  Abdominal:     Palpations: Abdomen is soft.     Tenderness: There is no abdominal tenderness.  Musculoskeletal:     Right lower leg: No swelling.     Left lower leg: No swelling.  Skin:    General: Skin is warm.     Findings: No rash.  Neurological:     Mental Status: She is alert and oriented to person, place, and time.     Data Reviewed: Hemoglobin 7.7, platelet count 80, white blood cell count 8.4, ferritin 265, creatinine 1.99  Family Communication: Family at bedside  Disposition: Status is: Inpatient Remains  inpatient appropriate because: Will transfuse 1 unit of packed red blood cells today.  Patient postop day 1.  Planned Discharge Destination: Home with Home Health    Time spent: 28 minutes  Author: Charlie Patterson, MD 01/02/2024 12:52 PM  For on call review [email or web address].

## 2024-01-02 NOTE — TOC CM/SW Note (Signed)
 Transition of Care (TOC) CM/SW Note   This patient requires the head of the bed to be elevated more than 30 degrees most of the time due to Closed fracture of right hip (HCC) , Essential hypertension, Shortness of breath, Hyperlipidemia, unspecified    Plus the need for frequent changes in the body position or the need for an immediate change of body position.  Patient is not able to walk the distance required to go the bathroom, or he/she is unable to safely negotiate stairs required to access the bathroom.  A 3in1 BSC will alleviate this problem

## 2024-01-02 NOTE — Evaluation (Signed)
 Physical Therapy Evaluation Patient Details Name: Laura Ferguson MRN: [id number] DOB: 07/22/1932 Today's Date: 01/02/2024  History of Present Illness  Laura Ferguson is a 89 y.o. female past medical history of hypertension, diabetes, CAD, and GERD who presented to the emergency room while slipping trying out of a vehicle and fell backwards onto her right hip and struck the back of her head.  She denies loss of consciousness.  She reported immediate pain in her right hip and was unable to get up or bear weight.  She reports the pain radiates down her thigh towards her knee.  Denies any pain in her tibia calf ankle or foot.  Denies any pre-existing right hip pain does report pre-existing right knee pain secondary to arthritis.  Denies any numbness or tingling in the leg at this time.   Clinical Impression  Pt seen for co-tx with PT/OT for +2 safety with pt.  Pt requesting to void upon entering into the room with nursing in room as well.  Pt performed transfer to sitting with mod-maxA +2 to come upright at the EOB.  Pt then attempted to stand with the use of the walker, and minA +2.  Pt noted to be dizzy and unable to take any progressive steps, along with not responding to questions being asked.  Pt assisted back into a supine position and pt was able to come back to and communicate with therapists.  Pt then assisted with being placed on bed pan.  Pt able to have BM and then was ready to attempt to stand again.  Pt noted to still have dizziness, however was able to come upright without assistance from therapist other than stabilizing the walker, and pt bracing with back of legs against the bed.  Pt still dizzy and requested to return to bed.  Pt repositioned and therapists discussed current options with family members.  Pt may improve, however current recommendations are for rehab placement at this time.      If plan is discharge home, recommend the following: A lot of help with walking and/or transfers;A lot  of help with bathing/dressing/bathroom;Help with stairs or ramp for entrance   Can travel by private vehicle   No    Equipment Recommendations Rolling walker (2 wheels)  Recommendations for Other Services       Functional Status Assessment Patient has had a recent decline in their functional status and demonstrates the ability to make significant improvements in function in a reasonable and predictable amount of time.     Precautions / Restrictions Precautions Precautions: Fall Restrictions Weight Bearing Restrictions Per Provider Order: Yes      Mobility  Bed Mobility Overal bed mobility: Needs Assistance Bed Mobility: Supine to Sit     Supine to sit: Mod assist, Max assist, +2 for physical assistance     General bed mobility comments: pt requiring verbal and tactile cuing along with +2 assistance to come seated EOB, mostly due to pain and weakness of the R LE    Transfers Overall transfer level: Needs assistance Equipment used: Rolling walker (2 wheels) Transfers: Sit to/from Stand Sit to Stand: Contact guard assist, Min assist           General transfer comment: Pt is able to come into standing with therapist support RW and bracing the LE's against the bed to come upright.  Likely would need min-modA +2 to come upright at this time.    Ambulation/Gait  General Gait Details: deferred due to inability to stand for prolonged period of time due to dizziness.  Stairs            Wheelchair Mobility     Tilt Bed    Modified Rankin (Stroke Patients Only)       Balance Overall balance assessment: Needs assistance, Mild deficits observed, not formally tested Sitting-balance support: Feet supported, Bilateral upper extremity supported Sitting balance-Leahy Scale: Fair     Standing balance support: Bilateral upper extremity supported, During functional activity, Reliant on assistive device for balance Standing balance-Leahy Scale:  Poor                               Pertinent Vitals/Pain Pain Assessment Pain Assessment: Faces Faces Pain Scale: Hurts even more Pain Location: R Hip Pain Descriptors / Indicators: Aching, Throbbing Pain Intervention(s): Limited activity within patient's tolerance, Monitored during session, Premedicated before session, Repositioned    Home Living Family/patient expects to be discharged to:: Private residence Living Arrangements: Alone Available Help at Discharge: Available PRN/intermittently;Family Type of Home: House Home Access: Stairs to enter Entrance Stairs-Rails: Can reach both;Left;Right Entrance Stairs-Number of Steps: 3   Home Layout: One level Home Equipment: Shower seat;Grab bars - tub/shower;Hand held shower head      Prior Function Prior Level of Function : Independent/Modified Independent             Mobility Comments: independent ADLs Comments: independent     Extremity/Trunk Assessment   Upper Extremity Assessment Upper Extremity Assessment: Generalized weakness    Lower Extremity Assessment Lower Extremity Assessment: Generalized weakness;RLE deficits/detail RLE Deficits / Details: R hip pain RLE: Unable to fully assess due to pain       Communication   Communication Communication: No apparent difficulties    Cognition Arousal: Alert Behavior During Therapy: WFL for tasks assessed/performed   PT - Cognitive impairments: No apparent impairments                         Following commands: Intact       Cueing Cueing Techniques: Verbal cues     General Comments      Exercises     Assessment/Plan    PT Assessment Patient needs continued PT services  PT Problem List Decreased strength;Decreased activity tolerance;Decreased mobility       PT Treatment Interventions DME instruction;Stair training;Functional mobility training;Therapeutic activities;Therapeutic exercise;Neuromuscular re-education;Balance  training;Patient/family education    PT Goals (Current goals can be found in the Care Plan section)  Acute Rehab PT Goals Patient Stated Goal: Pt and family in room wants pt. to go to home health PT Goal Formulation: With patient Time For Goal Achievement: 01/16/24 Potential to Achieve Goals: Poor    Frequency 7X/week     Co-evaluation               AM-PAC PT 6 Clicks Mobility  Outcome Measure Help needed turning from your back to your side while in a flat bed without using bedrails?: A Lot Help needed moving from lying on your back to sitting on the side of a flat bed without using bedrails?: A Lot Help needed moving to and from a bed to a chair (including a wheelchair)?: A Lot Help needed standing up from a chair using your arms (e.g., wheelchair or bedside chair)?: A Lot Help needed to walk in hospital room?: A Lot Help needed climbing 3-5 steps  with a railing? : A Lot 6 Click Score: 12    End of Session Equipment Utilized During Treatment: Gait belt Activity Tolerance: Other (comment) (limited WL to assess baseline) Patient left: in bed;with bed alarm set;with family/visitor present Nurse Communication: Mobility status PT Visit Diagnosis: Other abnormalities of gait and mobility (R26.89)    Time: 9057-8976 PT Time Calculation (min) (ACUTE ONLY): 41 min   Charges:   PT Evaluation $PT Eval Moderate Complexity: 1 Mod PT Treatments $Therapeutic Activity: 8-22 mins PT General Charges $$ ACUTE PT VISIT: 1 Visit         Fonda Simpers, PT, DPT Physical Therapist - [facility]  [facility]  01/02/2024, 11:25 AM

## 2024-01-02 NOTE — Telephone Encounter (Signed)
 Can we call and find out what questions she has?

## 2024-01-02 NOTE — Progress Notes (Signed)
° °  Subjective: 1 Day Post-Op Procedures (LRB): FIXATION, FRACTURE, INTERTROCHANTERIC, WITH INTRAMEDULLARY ROD (Right) Patient reports pain as mild.   PT in the room. Patient with dizziness. Family states hx of vertigo, in process of being evaluated by neuro.  We will continue therapy today.    Objective: Vital signs in last 24 hours: Temp:  [97 F (36.1 C)-98.7 F (37.1 C)] 98.4 F (36.9 C) (12/18 0743) Pulse Rate:  [64-98] 98 (12/18 0743) Resp:  [17-19] 17 (12/18 0743) BP: (102-144)/(48-76) 112/76 (12/18 1013) SpO2:  [98 %-100 %] 98 % (12/18 0743) Weight:  [53.4 kg-54 kg] 53.4 kg (12/18 0435)  Intake/Output from previous day: 12/17 0701 - 12/18 0700 In: 1038.6 [I.V.:738.6; IV Piggyback:300] Out: 1000 [Urine:900; Blood:100] Intake/Output this shift: No intake/output data recorded.  Recent Labs    12/31/23 1515 01/01/24 0913 01/02/24 0757  HGB 11.2* 9.8* 7.7*   Recent Labs    01/01/24 0913 01/02/24 0757  WBC 6.7 8.4  RBC 3.15* 2.48*  HCT 28.8* 23.2*  PLT 116* 88*   Recent Labs    01/01/24 0913 01/02/24 0757  NA 138 136  K 4.7 5.0  CL 104 105  CO2 24 20*  BUN 35* 42*  CREATININE 2.20* 1.99*  GLUCOSE 98 229*  CALCIUM  9.4 8.2*   No results for input(s): LABPT, INR in the last 72 hours.  EXAM General - Patient is Alert and Appropriate Extremity - Sensation intact distally Compartment soft Dressing - dressing C/D/I and no drainage Motor Function - intact, moving foot and toes well on exam.   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   LIMA to LAD, SVG to ramus intermedius, SVG to OM1 &  OM2, SVG to acute marginal, SVG to distal RCA   Type 2 diabetes mellitus (HCC)     Assessment/Plan:   1 Day Post-Op Procedures (LRB): FIXATION, FRACTURE, INTERTROCHANTERIC, WITH INTRAMEDULLARY ROD (Right) Principal Problem:   Closed fracture of right hip (HCC) Active Problems:   Essential hypertension   Hyperlipidemia, unspecified   Uncontrolled type 2 diabetes mellitus with hyperglycemia, with long-term current use of insulin  (HCC)   Acute kidney injury superimposed on CKD   Shortness of breath  Estimated body mass index is 21.53 kg/m as calculated from the following:   Height as of this encounter: 5' 2 (1.575 m).   Weight as of this encounter: 53.4 kg. Advance diet Up with therapy Pain well controlled VSS Acute post op blood loss anemia - Hgb 7.7. Recheck labs in the am CM to assist with discharge to home with HHPT vs SNF  DVT Prophylaxis - Lovenox , TED hose, and SCDs Weight-Bearing as tolerated to right leg   T. Medford Amber, PA-C [facility] 01/02/2024, 10:16 AM

## 2024-01-02 NOTE — Assessment & Plan Note (Addendum)
 Today's hemoglobin down to 7.3.  Will give 1 unit of packed red blood cells and recheck hemoglobin afterwards.  Patient already received 1 unit earlier on the hospital course.

## 2024-01-02 NOTE — Plan of Care (Signed)
  Problem: Education: Goal: Knowledge of General Education information will improve Description: Including pain rating scale, medication(s)/side effects and non-pharmacologic comfort measures Outcome: Progressing   Problem: Health Behavior/Discharge Planning: Goal: Ability to manage health-related needs will improve Outcome: Progressing   Problem: Clinical Measurements: Goal: Ability to maintain clinical measurements within normal limits will improve Outcome: Progressing Goal: Will remain free from infection Outcome: Progressing Goal: Diagnostic test results will improve Outcome: Progressing Goal: Respiratory complications will improve Outcome: Progressing Goal: Cardiovascular complication will be avoided Outcome: Progressing   Problem: Activity: Goal: Risk for activity intolerance will decrease Outcome: Progressing   Problem: Nutrition: Goal: Adequate nutrition will be maintained Outcome: Progressing   Problem: Coping: Goal: Level of anxiety will decrease Outcome: Progressing   Problem: Elimination: Goal: Will not experience complications related to bowel motility Outcome: Progressing Goal: Will not experience complications related to urinary retention Outcome: Progressing   Problem: Pain Managment: Goal: General experience of comfort will improve and/or be controlled Outcome: Progressing   Problem: Safety: Goal: Ability to remain free from injury will improve Outcome: Progressing   Problem: Skin Integrity: Goal: Risk for impaired skin integrity will decrease Outcome: Progressing   Problem: Education: Goal: Ability to describe self-care measures that may prevent or decrease complications (Diabetes Survival Skills Education) will improve Outcome: Progressing Goal: Individualized Educational Video(s) Outcome: Progressing   Problem: Coping: Goal: Ability to adjust to condition or change in health will improve Outcome: Progressing   Problem: Fluid  Volume: Goal: Ability to maintain a balanced intake and output will improve Outcome: Progressing   Problem: Health Behavior/Discharge Planning: Goal: Ability to identify and utilize available resources and services will improve Outcome: Progressing Goal: Ability to manage health-related needs will improve Outcome: Progressing   Problem: Metabolic: Goal: Ability to maintain appropriate glucose levels will improve Outcome: Progressing   Problem: Nutritional: Goal: Maintenance of adequate nutrition will improve Outcome: Progressing Goal: Progress toward achieving an optimal weight will improve Outcome: Progressing   Problem: Skin Integrity: Goal: Risk for impaired skin integrity will decrease Outcome: Progressing   Problem: Tissue Perfusion: Goal: Adequacy of tissue perfusion will improve Outcome: Progressing   Problem: Education: Goal: Verbalization of understanding the information provided (i.e., activity precautions, restrictions, etc) will improve Outcome: Progressing Goal: Individualized Educational Video(s) Outcome: Progressing   Problem: Activity: Goal: Ability to ambulate and perform ADLs will improve Outcome: Progressing   Problem: Clinical Measurements: Goal: Postoperative complications will be avoided or minimized Outcome: Progressing   Problem: Self-Concept: Goal: Ability to maintain and perform role responsibilities to the fullest extent possible will improve Outcome: Progressing   Problem: Pain Management: Goal: Pain level will decrease Outcome: Progressing

## 2024-01-02 NOTE — Progress Notes (Signed)
 Nutrition Follow-up  DOCUMENTATION CODES:   Not applicable  INTERVENTION:   -D/c Ensure Plus High Protein po BID, each supplement provides 350 kcal and 20 grams of protein  -Glucerna Shake po TID, each supplement provides 220 kcal and 10 grams of protein  -MVI with minerals daily -Continue carb modified diet  -Spiritual care consult for further support  NUTRITION DIAGNOSIS:   Increased nutrient needs related to post-op healing, hip fracture as evidenced by estimated needs.  Ongoing  GOAL:   Patient will meet greater than or equal to 90% of their needs  Progressing   MONITOR:   PO intake, Supplement acceptance  REASON FOR ASSESSMENT:   Consult Hip fracture protocol  ASSESSMENT:   89 y/o female with h/o HTN, HLD, DM, CKD, CAD s/p CABG x 3, anxiety, depression, chronic pain and GERD who is admitted with AKI and R hip fracture after fall.  12/17- s/p Right Hip Intramedullary nail   Reviewed I/O's: +39 ml x 24 hours and -111 ml since admission  UOP: 900 ml x 24 hours  Spoke with patient and daughter at bedside. Patient is pleasant and in good spirits, eating breakfast at time of visit. Daughters report that patient lives alone, but has a lot of family support. Patient became very tearful at time of visit, stating she has lost multiple children over the past year. Emotional support provided.   Patient reports good appetite and consuming pancakes, eggs, and bacon without difficulty. Daughter reports that patient eats very well at home, usually 2-3 meals per day.   They deny any weight loss. Her UBW is around 120#. Reviewed weight history. Weight has been stable over the past 3 months.   Discussed importance of good meal and supplement intake to promote healing. Patient amenable to supplements. RD reviewed plan of care with RN, patient, and family. Patient is eager to work with physical therapy this morning.   Medications reviewed and include colace, lovenox , senokot, and  0.9% sodium chloride  infusion @ 50 ml/hr.   Lab Results  Component Value Date   HGBA1C 8.6 (A) 11/28/2023   PTA DM medications are 30 units lantus  daily and 5 mg tradjenta  daily.   Labs reviewed: CBGS: 78-220 (inpatient orders for glycemic control are 0-15 units insulin  aspart TID with meals, 0-5 units insulin  aspart daily at bedtime, and 10 units insulin  glargine daily).    NUTRITION - FOCUSED PHYSICAL EXAM:  Flowsheet Row Most Recent Value  Orbital Region No depletion  Upper Arm Region Mild depletion  Thoracic and Lumbar Region No depletion  Buccal Region No depletion  Temple Region Mild depletion  Clavicle Bone Region No depletion  Clavicle and Acromion Bone Region No depletion  Scapular Bone Region No depletion  Dorsal Hand No depletion  Patellar Region No depletion  Anterior Thigh Region No depletion  Posterior Calf Region No depletion  Edema (RD Assessment) None  Hair Reviewed  Eyes Reviewed  Mouth Reviewed  Skin Reviewed  Nails Reviewed    Diet Order:   Diet Order             Diet Carb Modified Room service appropriate? Yes  Diet effective now                   EDUCATION NEEDS:   Education needs have been addressed  Skin:  Skin Assessment: Skin Integrity Issues: Skin Integrity Issues:: Incisions Incisions: closed right hip, closed right thigh  Last BM:  12/31/23  Height:   Ht Readings from Last  1 Encounters:  01/01/24 5' 2 (1.575 m)    Weight:   Wt Readings from Last 1 Encounters:  01/02/24 53.4 kg    Ideal Body Weight:  50 kg  BMI:  Body mass index is 21.53 kg/m.  Estimated Nutritional Needs:   Kcal:  1400-1600kcal/day  Protein:  70-80g/day  Fluid:  1.3-1.5L/day    Margery ORN, RD, LDN, CDCES Registered Dietitian III Certified Diabetes Care and Education Specialist If unable to reach this RD, please use RD Inpatient group chat on secure chat between hours of 8am-4 pm daily

## 2024-01-02 NOTE — Progress Notes (Signed)
°  Chaplain On-Call responded to Spiritual Care Consult Order from Charlie Patterson, MD.  The Consult Order noted major life transition and multiple losses in the past year.  Chaplain met the patient and her daughter Verneita.  Chaplain offered supportive listening as the patient described her strong feelings of grief and sadness about the deaths of children in the past year. She stated that she is relying on her life-long faith to help her through this overwhelming time in her life.  Chaplain provided prayer and spiritual and emotional support.  Chaplain Bebe Ardean EMERSON Hershal., [facility]

## 2024-01-02 NOTE — Telephone Encounter (Signed)
 Spoke with pt's daughter, Angeline (on dpr), returning her call. She's asking for recommendations for rehab for pt after leaving the hospital for hip fx. I told her most of the ones in the local area. However, it would be best to check with insurance to see which ones are covered. She verbalizes understanding and expresses her thanks for the info.

## 2024-01-02 NOTE — Anesthesia Postprocedure Evaluation (Signed)
 Anesthesia Post Note  Patient: Laura Ferguson  Procedure(s) Performed: FIXATION, FRACTURE, INTERTROCHANTERIC, WITH INTRAMEDULLARY ROD (Right: Hip)  Patient location during evaluation: PACU Anesthesia Type: General Level of consciousness: awake and alert Pain management: pain level controlled Vital Signs Assessment: post-procedure vital signs reviewed and stable Respiratory status: spontaneous breathing, nonlabored ventilation, respiratory function stable and patient connected to nasal cannula oxygen Cardiovascular status: blood pressure returned to baseline and stable Postop Assessment: no apparent nausea or vomiting Anesthetic complications: no   No notable events documented.   Last Vitals:  Vitals:   01/02/24 0414 01/02/24 0743  BP: (!) 105/48 (!) 114/56  Pulse: 93 98  Resp: 18 17  Temp: 37.1 C 36.9 C  SpO2: 98% 98%    Last Pain:  Vitals:   01/02/24 9366  TempSrc:   PainSc: 2                  Debby Mines

## 2024-01-02 NOTE — Evaluation (Addendum)
 Occupational Therapy Evaluation Patient Details Name: Laura Ferguson MRN: [id number] DOB: [birth year] Today's Date: 01/02/2024   History of Present Illness   Pt is a 89 y.o. female presents s/p fall resulting in a R intertrochanteric fracture, comminuted and L non displaced superior pubic ramus fx. S/p R hip IM nail. PMH of CAD status post CABG in 2009, hypertension, mild neurocognitive impairment, glaucoma and type 2 diabetes.     Clinical Impressions Pt was seen for OT evaluation this date. PTA, pt resides at home alone and is typically MOD I/IND with all tasks. Family is nearby.   Pt presents with deficits in strength, balance, pain management, dizziness, and safety limiting their ability to perform ADL management at baseline level. Pt currently requires Mod/MAX A x1-2 for bed mobility during session for RLE management and d/t dizziness and urgent need for BM. Upon transition to sitting pt with increased dizziness and became less verbally responsive prompting return to supine. BP 112/51 in supine with symptoms resolving. Pt required MOD/Max A to roll to bil sides in bed for repositioning and placement of bed pan. Total A for bed level peri-care. Pt attempted bed mobility again, noted with mild dizziness upon sitting up. Able to stand x1 with CGA x2 via pushing BLEs off the bed to come upright, then Min A x2 in standing dt buckling and inability to bear weight through RLE to progress mobility. Max A X2 to lateral scoot and return to supine. Session limited by pain and dizziness. Pt would benefit from skilled OT services to address noted impairments and functional limitations to maximize safety and independence while minimizing future risk of falls, injury, and readmission. Do anticipate the need for follow up OT services upon acute hospital DC.      If plan is discharge home, recommend the following:   A lot of help with walking and/or transfers;A lot of help with bathing/dressing/bathroom;Help  with stairs or ramp for entrance;Assistance with cooking/housework     Functional Status Assessment   Patient has had a recent decline in their functional status and demonstrates the ability to make significant improvements in function in a reasonable and predictable amount of time.     Equipment Recommendations   Other (comment) (defer to next venue)     Recommendations for Other Services         Precautions/Restrictions   Precautions Precautions: Fall Restrictions Weight Bearing Restrictions Per Provider Order: Yes RLE Weight Bearing Per Provider Order: Weight bearing as tolerated     Mobility Bed Mobility Overal bed mobility: Needs Assistance Bed Mobility: Supine to Sit, Sit to Supine, Rolling Rolling: Mod assist, Max assist, Used rails   Supine to sit: Mod assist, +2 for physical assistance, +2 for safety/equipment Sit to supine: Max assist, Mod assist, +2 for physical assistance   General bed mobility comments: RLE management to reach EOB with increased time and cues, +2 for safety d/t dizziness and pain to return to supine; performed x2 during session d/t dizziness and need for urgent BM    Transfers Overall transfer level: Needs assistance Equipment used: Rolling walker (2 wheels) Transfers: Sit to/from Stand Sit to Stand: Contact guard assist, Min assist           General transfer comment: +2 for safety d/t dizziness, stood at EOB using RW with BLEs braced on bed to come into standing; limited standing tolerance with buckling and minimal weight bearing tolerated through RLE; session limited by dizziness having to return to supine  Balance Overall balance assessment: Needs assistance, Mild deficits observed, not formally tested Sitting-balance support: Feet supported, Bilateral upper extremity supported Sitting balance-Leahy Scale: Fair     Standing balance support: Bilateral upper extremity supported, During functional activity, Reliant on  assistive device for balance Standing balance-Leahy Scale: Poor Standing balance comment: BUE support on RW and MIN A                           ADL either performed or assessed with clinical judgement   ADL Overall ADL's : Needs assistance/impaired                             Toileting- Clothing Manipulation and Hygiene: Total assistance;Bed level Toileting - Clothing Manipulation Details (indicate cue type and reason): bed pan and bed level peri-care d/t dizziness             Vision         Perception         Praxis         Pertinent Vitals/Pain Pain Assessment Pain Assessment: Faces Faces Pain Scale: Hurts even more Pain Location: R Hip Pain Descriptors / Indicators: Aching, Throbbing Pain Intervention(s): Monitored during session, Limited activity within patient's tolerance, Premedicated before session, Repositioned     Extremity/Trunk Assessment Upper Extremity Assessment Upper Extremity Assessment: Generalized weakness   Lower Extremity Assessment Lower Extremity Assessment: Defer to PT evaluation RLE Deficits / Details: R hip pain RLE: Unable to fully assess due to pain       Communication Communication Communication: No apparent difficulties   Cognition Arousal: Alert Behavior During Therapy: WFL for tasks assessed/performed                                 Following commands: Intact       Cueing  General Comments   Cueing Techniques: Verbal cues      Exercises Other Exercises Other Exercises: Edu on role of OT in acute setting and DC recommendations.   Shoulder Instructions      Home Living Family/patient expects to be discharged to:: Private residence Living Arrangements: Alone Available Help at Discharge: Available PRN/intermittently;Family Type of Home: House Home Access: Stairs to enter Secretary/administrator of Steps: 3 Entrance Stairs-Rails: Can reach both;Left;Right Home Layout: One  level     Bathroom Shower/Tub: Chief Strategy Officer: Standard     Home Equipment: Shower seat;Grab bars - tub/shower;Hand held shower head          Prior Functioning/Environment Prior Level of Function : Independent/Modified Independent             Mobility Comments: independent ADLs Comments: independent    OT Problem List: Decreased strength;Pain;Impaired balance (sitting and/or standing);Decreased activity tolerance   OT Treatment/Interventions: Self-care/ADL training;Therapeutic exercise;Patient/family education;Balance training;Therapeutic activities;DME and/or AE instruction      OT Goals(Current goals can be found in the care plan section)   Acute Rehab OT Goals Patient Stated Goal: get better OT Goal Formulation: With patient/family Time For Goal Achievement: 01/16/24 Potential to Achieve Goals: Fair ADL Goals Pt Will Perform Lower Body Dressing: sit to/from stand;sitting/lateral leans;with min assist;with contact guard assist Pt Will Transfer to Toilet: with contact guard assist;ambulating;regular height toilet;stand pivot transfer Additional ADL Goal #1: Pt will demo implementation of 1 learned falls prevention strategy to maximize safety and  independence.   OT Frequency:  Min 2X/week    Co-evaluation PT/OT/SLP Co-Evaluation/Treatment: Yes Reason for Co-Treatment: Complexity of the patient's impairments (multi-system involvement);For patient/therapist safety;To address functional/ADL transfers PT goals addressed during session: Mobility/safety with mobility OT goals addressed during session: ADL's and self-care      AM-PAC OT 6 Clicks Daily Activity     Outcome Measure Help from another person eating meals?: A Little Help from another person taking care of personal grooming?: A Little Help from another person toileting, which includes using toliet, bedpan, or urinal?: A Lot Help from another person bathing (including washing, rinsing,  drying)?: A Lot Help from another person to put on and taking off regular upper body clothing?: A Little Help from another person to put on and taking off regular lower body clothing?: A Lot 6 Click Score: 15   End of Session Equipment Utilized During Treatment: Gait belt;Rolling walker (2 wheels) Nurse Communication: Mobility status  Activity Tolerance: Treatment limited secondary to medical complications (Comment) (dizziness) Patient left: in bed;with call bell/phone within reach;with bed alarm set;with family/visitor present  OT Visit Diagnosis: Other abnormalities of gait and mobility (R26.89);Muscle weakness (generalized) (M62.81);Unsteadiness on feet (R26.81)                Time: 9053-8976 OT Time Calculation (min): 37 min Charges:  OT General Charges $OT Visit: 1 Visit OT Evaluation $OT Eval Moderate Complexity: 1 Mod Anika Shore Chrismon, OTR/L  01/02/2024, 2:00 PM  Staisha Winiarski E Chrismon 01/02/2024, 2:00 PM

## 2024-01-02 NOTE — Telephone Encounter (Signed)
 Copied from CRM #[id number]. Topic: Clinical - Medical Advice >> Jan 02, 2024  9:17 AM Laura Ferguson wrote: Reason for CRM: Patient fell and broke her hip is in [facility]. Angeline Pouch would like Ferguson call back [phone]) because she has has questions to ask Ms.Clark. Also there is bad reception in hospital it is ok to leave Ferguson message.

## 2024-01-02 NOTE — Care Management Important Message (Signed)
 Important Message  Patient Details  Name: Laura Ferguson MRN: [id number] Date of Birth: [birth year]   Important Message Given:  Yes - Medicare IM     Sahaana Weitman W, CMA 01/02/2024, 1:55 PM

## 2024-01-03 ENCOUNTER — Ambulatory Visit

## 2024-01-03 ENCOUNTER — Inpatient Hospital Stay

## 2024-01-03 ENCOUNTER — Other Ambulatory Visit: Payer: Self-pay | Admitting: Pharmacist

## 2024-01-03 ENCOUNTER — Other Ambulatory Visit (HOSPITAL_COMMUNITY): Payer: Self-pay

## 2024-01-03 DIAGNOSIS — Z794 Long term (current) use of insulin: Secondary | ICD-10-CM | POA: Diagnosis not present

## 2024-01-03 DIAGNOSIS — R9389 Abnormal findings on diagnostic imaging of other specified body structures: Secondary | ICD-10-CM

## 2024-01-03 DIAGNOSIS — N189 Chronic kidney disease, unspecified: Secondary | ICD-10-CM

## 2024-01-03 DIAGNOSIS — N179 Acute kidney failure, unspecified: Secondary | ICD-10-CM | POA: Diagnosis not present

## 2024-01-03 DIAGNOSIS — R0602 Shortness of breath: Secondary | ICD-10-CM | POA: Diagnosis not present

## 2024-01-03 DIAGNOSIS — F419 Anxiety disorder, unspecified: Secondary | ICD-10-CM

## 2024-01-03 DIAGNOSIS — E559 Vitamin D deficiency, unspecified: Secondary | ICD-10-CM

## 2024-01-03 DIAGNOSIS — D62 Acute posthemorrhagic anemia: Secondary | ICD-10-CM | POA: Diagnosis not present

## 2024-01-03 DIAGNOSIS — R509 Fever, unspecified: Secondary | ICD-10-CM | POA: Diagnosis not present

## 2024-01-03 DIAGNOSIS — E785 Hyperlipidemia, unspecified: Secondary | ICD-10-CM

## 2024-01-03 DIAGNOSIS — Z09 Encounter for follow-up examination after completed treatment for conditions other than malignant neoplasm: Secondary | ICD-10-CM

## 2024-01-03 DIAGNOSIS — I951 Orthostatic hypotension: Secondary | ICD-10-CM | POA: Diagnosis not present

## 2024-01-03 DIAGNOSIS — S72001S Fracture of unspecified part of neck of right femur, sequela: Secondary | ICD-10-CM | POA: Diagnosis not present

## 2024-01-03 DIAGNOSIS — E1165 Type 2 diabetes mellitus with hyperglycemia: Secondary | ICD-10-CM | POA: Diagnosis not present

## 2024-01-03 DIAGNOSIS — E11319 Type 2 diabetes mellitus with unspecified diabetic retinopathy without macular edema: Secondary | ICD-10-CM

## 2024-01-03 DIAGNOSIS — I10 Essential (primary) hypertension: Secondary | ICD-10-CM | POA: Diagnosis not present

## 2024-01-03 LAB — BASIC METABOLIC PANEL WITH GFR
Anion gap: 10 (ref 5–15)
BUN: 44 mg/dL — ABNORMAL HIGH (ref 8–23)
CO2: 22 mmol/L (ref 22–32)
Calcium: 8.6 mg/dL — ABNORMAL LOW (ref 8.9–10.3)
Chloride: 107 mmol/L (ref 98–111)
Creatinine, Ser: 1.68 mg/dL — ABNORMAL HIGH (ref 0.44–1.00)
GFR, Estimated: 29 mL/min — ABNORMAL LOW
Glucose, Bld: 216 mg/dL — ABNORMAL HIGH (ref 70–99)
Potassium: 4.2 mmol/L (ref 3.5–5.1)
Sodium: 139 mmol/L (ref 135–145)

## 2024-01-03 LAB — CBC
HCT: 26.6 % — ABNORMAL LOW (ref 36.0–46.0)
Hemoglobin: 9 g/dL — ABNORMAL LOW (ref 12.0–15.0)
MCH: 30.9 pg (ref 26.0–34.0)
MCHC: 33.8 g/dL (ref 30.0–36.0)
MCV: 91.4 fL (ref 80.0–100.0)
Platelets: 88 K/uL — ABNORMAL LOW (ref 150–400)
RBC: 2.91 MIL/uL — ABNORMAL LOW (ref 3.87–5.11)
RDW: 13.6 % (ref 11.5–15.5)
WBC: 7.1 K/uL (ref 4.0–10.5)
nRBC: 0 % (ref 0.0–0.2)

## 2024-01-03 LAB — TYPE AND SCREEN
ABO/RH(D): O POS
Antibody Screen: NEGATIVE
Unit division: 0

## 2024-01-03 LAB — GLUCOSE, CAPILLARY
Glucose-Capillary: 249 mg/dL — ABNORMAL HIGH (ref 70–99)
Glucose-Capillary: 239 mg/dL — ABNORMAL HIGH (ref 70–99)
Glucose-Capillary: 169 mg/dL — ABNORMAL HIGH (ref 70–99)
Glucose-Capillary: 105 mg/dL — ABNORMAL HIGH (ref 70–99)

## 2024-01-03 LAB — BPAM RBC
Blood Product Expiration Date: 202601212359
ISSUE DATE / TIME: 202512181150
Unit Type and Rh: 5100

## 2024-01-03 MED ORDER — EZETIMIBE 10 MG PO TABS
10.0000 mg | ORAL_TABLET | Freq: Every evening | ORAL | 1 refills | Status: AC
  Filled 2024-01-03: qty 90, 90d supply, fill #0
  Filled 2024-02-10: qty 30, 30d supply, fill #0

## 2024-01-03 MED ORDER — INSULIN GLARGINE 100 UNIT/ML ~~LOC~~ SOLN
18.0000 [IU] | Freq: Every day | SUBCUTANEOUS | Status: DC
  Administered 2024-01-04: 18 [IU] via SUBCUTANEOUS
  Filled 2024-01-03: qty 0.18

## 2024-01-03 MED ORDER — LOSARTAN POTASSIUM 100 MG PO TABS
100.0000 mg | ORAL_TABLET | Freq: Every day | ORAL | 1 refills | Status: DC
  Filled 2024-01-03: qty 90, 90d supply, fill #0

## 2024-01-03 MED ORDER — TRAZODONE HCL 50 MG PO TABS
50.0000 mg | ORAL_TABLET | Freq: Every day | ORAL | Status: DC
  Administered 2024-01-03 – 2024-01-05 (×3): 50 mg via ORAL
  Filled 2024-01-03 (×3): qty 1

## 2024-01-03 MED ORDER — HYDROCODONE-ACETAMINOPHEN 5-325 MG PO TABS
1.0000 | ORAL_TABLET | ORAL | Status: DC | PRN
  Administered 2024-01-04: 1 via ORAL
  Filled 2024-01-03: qty 1

## 2024-01-03 MED ORDER — HYDROCODONE-ACETAMINOPHEN 5-325 MG PO TABS: 1.0000 | ORAL_TABLET | Freq: Four times a day (QID) | ORAL | 0 refills | Status: DC | PRN

## 2024-01-03 MED ORDER — LINAGLIPTIN 5 MG PO TABS
5.0000 mg | ORAL_TABLET | Freq: Every day | ORAL | 1 refills | Status: AC
  Filled 2024-01-03: qty 90, 90d supply, fill #0
  Filled 2024-02-10: qty 30, 30d supply, fill #0

## 2024-01-03 MED ORDER — PANTOPRAZOLE SODIUM 40 MG PO TBEC
40.0000 mg | DELAYED_RELEASE_TABLET | Freq: Every morning | ORAL | 3 refills | Status: AC
  Filled 2024-01-03: qty 90, 90d supply, fill #0
  Filled 2024-02-10: qty 30, 30d supply, fill #0

## 2024-01-03 MED ORDER — METOPROLOL SUCCINATE ER 50 MG PO TB24
50.0000 mg | ORAL_TABLET | Freq: Every day | ORAL | 1 refills | Status: DC
  Filled 2024-01-03: qty 180, 180d supply, fill #0

## 2024-01-03 MED ORDER — ENOXAPARIN SODIUM 30 MG/0.3ML IJ SOSY: 30.0000 mg | PREFILLED_SYRINGE | INTRAMUSCULAR | 0 refills | Status: DC

## 2024-01-03 MED ORDER — ASPIRIN 81 MG PO TBEC
81.0000 mg | DELAYED_RELEASE_TABLET | Freq: Every day | ORAL | 12 refills | Status: DC
  Filled 2024-01-03: qty 30, 30d supply, fill #0

## 2024-01-03 MED ORDER — ROSUVASTATIN CALCIUM 10 MG PO TABS
10.0000 mg | ORAL_TABLET | Freq: Every day | ORAL | 1 refills | Status: AC
  Filled 2024-01-03 – 2024-02-10 (×2): qty 90, 90d supply, fill #0

## 2024-01-03 MED ORDER — VITAMIN D 50 MCG (2000 UT) PO CAPS
1.0000 | ORAL_CAPSULE | Freq: Every day | ORAL | 1 refills | Status: AC
  Filled 2024-01-03: qty 90, 90d supply, fill #0
  Filled 2024-02-10: qty 30, 30d supply, fill #0

## 2024-01-03 MED ORDER — SERTRALINE HCL 25 MG PO TABS
25.0000 mg | ORAL_TABLET | Freq: Every day | ORAL | 1 refills | Status: AC
  Filled 2024-01-03 – 2024-02-10 (×2): qty 90, 90d supply, fill #0

## 2024-01-03 NOTE — Progress Notes (Signed)
 Physical Therapy Treatment Patient Details Name: Laura Ferguson MRN: [id number] DOB: [birth year] Today's Date: 01/03/2024   History of Present Illness Pt is a 89 y.o. female presents s/p fall resulting in a R intertrochanteric fracture, comminuted and L non displaced superior pubic ramus fx. S/p R hip IM nail. PMH of CAD status post CABG in 2009, hypertension, mild neurocognitive impairment, glaucoma and type 2 diabetes.    PT Comments  Pt was supine in bed with daughters (x2) at bedside. Pt is alert and agreeable but does require increased time to process and increased time to move 2/2 to pain. Pt was able to exit bed and stand 3 x EOB to RW with extensive +1 assistance. Pt does endorse severe dizziness upon first standing attempt. BP 88/58 (65) however BP less soft the next few times standing. Pt stood ~ 20-25 sec. with heavy L lateral lean to un weight RLE. Pt does have severe pain in RLE with wt bearing.  Educated on expectation from [facility] services and what family will need to manage in current state. Both daughters and patient state understanding. Dc recs remain appropriate however family planning to take pt home with proper DME.    If plan is discharge home, recommend the following: A lot of help with walking and/or transfers;A lot of help with bathing/dressing/bathroom;Help with stairs or ramp for entrance     Equipment Recommendations  Hospital bed;BSC/3in1;Rolling walker (2 wheels);Wheelchair (measurements PT);Wheelchair cushion (measurements PT)       Precautions / Restrictions Precautions Precautions: Fall Recall of Precautions/Restrictions: Intact Restrictions Weight Bearing Restrictions Per Provider Order: Yes RLE Weight Bearing Per Provider Order: Weight bearing as tolerated     Mobility  Bed Mobility Overal bed mobility: Needs Assistance Bed Mobility: Supine to Sit, Sit to Supine Rolling: Mod assist, Max assist Supine to sit: Mod assist, Max assist Sit to supine: Mod assist,  Max assist General bed mobility comments: Pt was able to exit bed with mod-max assist + increased toime and vcs for sequencing    Transfers Overall transfer level: Needs assistance Equipment used: Rolling walker (2 wheels) Transfers: Sit to/from Stand Sit to Stand: Mod assist, From elevated surface  General transfer comment: pt stood 3-4 time EOB with +1 mod assist + vcs. pt does endorse slight dizziness but not as severe as previous attempt with OOB. pt BP dropped from 113/82 down to 88/58 (MAP 65). on the other STS pt did not have as severe of dizziness and BP MAP maintaining > 90%    Ambulation/Gait  General Gait Details: Poor wt acceptance on RLE. severe L lateral lean in standing        Communication Communication Communication: No apparent difficulties  Cognition Arousal: Alert Behavior During Therapy: WFL for tasks assessed/performed   PT - Cognitive impairments: No apparent impairments    PT - Cognition Comments: Pt is A and O x 3. slow processig  but pleasnat and cooperative Following commands: Intact      Cueing Cueing Techniques: Verbal cues     General Comments General comments (skin integrity, edema, etc.): lengthy ducation with pt and daughters about what will be needed at DC if plan remains to DC directly home with HHPT.      Pertinent Vitals/Pain Pain Assessment Pain Assessment: No/denies pain Pain Location: R Hip Pain Descriptors / Indicators: Aching, Throbbing Pain Intervention(s): Limited activity within patient's tolerance, Monitored during session, Premedicated before session, Repositioned     PT Goals (current goals can now be found in the  care plan section) Acute Rehab PT Goals Patient Stated Goal: Pt and family in room wants pt. to go home with home health Progress towards PT goals: Progressing toward goals    Frequency    7X/week       Co-evaluation     PT goals addressed during session: Mobility/safety with mobility;Balance;Proper  use of DME;Strengthening/ROM        AM-PAC PT 6 Clicks Mobility   Outcome Measure  Help needed turning from your back to your side while in a flat bed without using bedrails?: A Lot Help needed moving from lying on your back to sitting on the side of a flat bed without using bedrails?: A Lot Help needed moving to and from a bed to a chair (including a wheelchair)?: A Lot Help needed standing up from a chair using your arms (e.g., wheelchair or bedside chair)?: A Lot Help needed to walk in hospital room?: A Lot Help needed climbing 3-5 steps with a railing? : Total 6 Click Score: 11    End of Session   Activity Tolerance: Patient tolerated treatment well;Patient limited by pain Patient left: in bed;with bed alarm set;with family/visitor present Nurse Communication: Mobility status PT Visit Diagnosis: Other abnormalities of gait and mobility (R26.89)     Time: 8766-8698 PT Time Calculation (min) (ACUTE ONLY): 28 min  Charges:    $Therapeutic Activity: 23-37 mins PT General Charges $$ ACUTE PT VISIT: 1 Visit                     Rankin Essex PTA 01/03/2024, 2:17 PM

## 2024-01-03 NOTE — Progress Notes (Addendum)
" ° °  Subjective: 2 Days Post-Op Procedures (LRB): FIXATION, FRACTURE, INTERTROCHANTERIC, WITH INTRAMEDULLARY ROD (Right) Patient reports pain as mild.   Pain well controlled. No complaints. We will continue therapy today.    Objective: Vital signs in last 24 hours: Temp:  [98.8 F (37.1 C)-100.1 F (37.8 C)] 99.1 F (37.3 C) (12/19 0801) Pulse Rate:  [95-108] 100 (12/19 0801) Resp:  [16-18] 18 (12/19 0801) BP: (99-128)/(46-81) 128/74 (12/19 0801) SpO2:  [98 %-100 %] 98 % (12/19 0801)  Intake/Output from previous day: 12/18 0701 - 12/19 0700 In: 308.3 [Blood:308.3] Out: -  Intake/Output this shift: No intake/output data recorded.  Recent Labs    12/31/23 1515 01/01/24 0913 01/02/24 0757 01/03/24 0534  HGB 11.2* 9.8* 7.7* 9.0*   Recent Labs    01/02/24 0757 01/03/24 0534  WBC 8.4 7.1  RBC 2.48* 2.91*  HCT 23.2* 26.6*  PLT 88* 88*   Recent Labs    01/02/24 0757 01/03/24 0534  NA 136 139  K 5.0 4.2  CL 105 107  CO2 20* 22  BUN 42* 44*  CREATININE 1.99* 1.68*  GLUCOSE 229* 216*  CALCIUM  8.2* 8.6*   No results for input(s): LABPT, INR in the last 72 hours.  EXAM General - Patient is Alert and Appropriate Extremity - Sensation intact distally Compartment soft Dressing - dressing C/D/I and no drainage Motor Function - intact, moving foot and toes well on exam.   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   LIMA to LAD, SVG to ramus intermedius, SVG to OM1 & OM2, SVG to acute marginal, SVG to distal RCA   Type 2 diabetes mellitus (HCC)     Assessment/Plan:   2 Days Post-Op Procedures  (LRB): FIXATION, FRACTURE, INTERTROCHANTERIC, WITH INTRAMEDULLARY ROD (Right) Principal Problem:   Closed fracture of right hip (HCC) Active Problems:   Essential hypertension   Hyperlipidemia, unspecified   Uncontrolled type 2 diabetes mellitus with hyperglycemia, with long-term current use of insulin  (HCC)   Acute kidney injury superimposed on CKD   Shortness of breath   Postoperative anemia due to acute blood loss  Estimated body mass index is 21.53 kg/m as calculated from the following:   Height as of this encounter: 5' 2 (1.575 m).   Weight as of this encounter: 53.4 kg. Advance diet Up with therapy Pain well controlled VSS Acute post op blood loss anemia - Hgb 9.0, s/p 1 unit PRBC 12/18 Recheck labs in the am CM to assist with discharge to home with HHPT vs SNF   Follow up with [facility] in 2 weeks Lovenox  30 mg SQ daily x 14 days at discharge   DVT Prophylaxis - Lovenox , TED hose, and SCDs Weight-Bearing as tolerated to right leg   T. Medford Amber, PA-C [facility] 01/03/2024, 11:57 AM   "

## 2024-01-03 NOTE — Assessment & Plan Note (Signed)
CT scan of chest ordered

## 2024-01-03 NOTE — TOC CM/SW Note (Signed)
 Transition of Care (TOC) CM/SW Note   Patient has mobility impairment that cannot be resolved by a cane, walker or crutches. They will need a bariatric wheelchair and are able to self propel.

## 2024-01-03 NOTE — Progress Notes (Signed)
 PT Cancellation Note  Patient Details Name: Laura Ferguson MRN: [id number] DOB: May 08, 1932   Cancelled Treatment:    Reason Eval/Treat Not Completed: Patient at procedure or test/unavailable.  Chart reviewed and attempted to see pt.  Upon entry to the room, pt off the floor with CT imaging.  Family members in room and stating that she was able to get to the recliner this morning, however NT notes the pt is not actively able to move and they total assisted her to the recliner.  Will re-attempt at later date/time as medically appropriate.   Fonda Simpers, PT, DPT Physical Therapist - [place]  [facility]  01/03/2024, 11:07 AM

## 2024-01-03 NOTE — TOC Progression Note (Signed)
 Transition of Care [facility]) - Progression Note    Patient Details  Name: Laura Ferguson MRN: [id number] Date of Birth: [birth year]  Transition of Care [facility]) CM/SW Contact  Alvaro Louder, [place] Phone Number: 01/03/2024, 3:06 PM  Clinical Narrative:   Patient and family has selected HH PT, OT, and RN with [facility] at discharge. LCSWA discussed PT recommendation of WC and BSC 3in1. Patient was agreeable, Family indicated that they wanted a Hospital bed delivered to their home. LCSWA reached out to [employer] coordinator to set up equipment delivery.  TOC to follow for discharge                    Expected Discharge Plan and Services                                               Social Drivers of Health (SDOH) Interventions SDOH Screenings   Food Insecurity: No Food Insecurity (12/31/2023)  Recent Concern: Food Insecurity - Food Insecurity Present (12/26/2023)   Received from [facility]  Housing: Low Risk (12/31/2023)  Transportation Needs: No Transportation Needs (12/31/2023)  Utilities: Not At Risk (12/31/2023)  Alcohol Screen: Low Risk (01/22/2023)  Depression (PHQ2-9): Low Risk (11/28/2023)  Recent Concern: Depression (PHQ2-9) - High Risk (09/20/2023)  Financial Resource Strain: Low Risk  (12/26/2023)   Received from [facility]  Physical Activity: Inactive (01/22/2023)  Social Connections: Moderately Isolated (12/31/2023)  Stress: No Stress Concern Present (01/22/2023)  Tobacco Use: Low Risk (12/31/2023)  Health Literacy: Adequate Health Literacy (01/22/2023)    Readmission Risk Interventions     No data to display

## 2024-01-03 NOTE — Plan of Care (Signed)
" °  Problem: Education: Goal: Knowledge of General Education information will improve Description: Including pain rating scale, medication(s)/side effects and non-pharmacologic comfort measures Outcome: Progressing   Problem: Activity: Goal: Risk for activity intolerance will decrease Outcome: Progressing   Problem: Nutrition: Goal: Adequate nutrition will be maintained Outcome: Progressing   Problem: Coping: Goal: Level of anxiety will decrease Outcome: Progressing   Problem: Safety: Goal: Ability to remain free from injury will improve Outcome: Progressing   Problem: Pain Managment: Goal: General experience of comfort will improve and/or be controlled Outcome: Progressing   "

## 2024-01-03 NOTE — Progress Notes (Signed)
 " Progress Note   Patient: Laura Ferguson FMW:[id number] DOB: [birth year] DOA: 12/31/2023     3 DOS: the patient was seen and examined on 01/03/2024   Brief hospital course: 89 y.o. female with medical history significant of CAD status post CABG in 2009, hypertension, mild neurocognitive impairment, glaucoma and type 2 diabetes.   Patient states that she was getting out of the car after returning home from an appointment with ophthalmology for treatment of her glaucoma.  She states that she was getting out of the car and that she was holding onto the car door but it moved away from her causing her to fall backwards onto concrete, striking the back of her head and R hip. She denies LOC or dizziness/lightheadedness. She noted immediate pain in her R hip and could not bear pain. She was brought in via EMS.    She denies any difficulty eating or drinking, chest pain or palpitations, dyspnea, constipation or diarrhea.   Of note, patient per chart review has a history of vertigo and follows with ENT.  She has also had some balance issues.  She recently saw neurology for this  01/01/2024.  Patient seen this morning prior to the operating room. 12/18.  Patient's hemoglobin today dropped down to 7.7.  Patient felt a little lightheaded with moving around with physical therapy.  Will give a unit of blood case discussed with orthopedic surgery. 12/19.  Patient did not sleep last night and was a little confused this morning but following commands.  Hemoglobin up to 9.0, creatinine down to 1.68.  Patient advised to eat and drink.  Assessment and Plan: * Closed fracture of right hip [facility]) Dr. Lorelle took to the operating room on 12/17 for right hip intramedullary nail for right hip fracture.  Family would like to take the patient home after hospitalization.  Postoperative anemia due to acute blood loss Patient transfused a unit of blood on a hemoglobin of 7.7.  Hemoglobin up to 9.0.  Acute kidney injury  superimposed on CKD AKI on CKD stage IIIb.  Creatinine 1.2 on admission and up to 2.2 at its peak.  Today's creatinine 1.68.   Uncontrolled type 2 diabetes mellitus with hyperglycemia, with long-term current use of insulin  (HCC) Last hemoglobin A1c 8.6.  Sugars starting to elevate.  Will increase Lantus  insulin  to 18 units daily.  Continue sliding scale.  Essential hypertension With blood pressure on the lower side will hold losartan  and decrease dose of metoprolol   Hyperlipidemia, unspecified On Crestor  and Zetia   Low grade fever Could be postoperative fever versus fever after blood.  Chest x-ray negative.  So far blood culture is negative.  Asked nursing staff to send off her urine.  Abnormal chest x-ray CT scan of chest ordered.  Shortness of breath Resolved        Subjective: Patient did not sleep last night.  Slight pain.  Physical Exam: Vitals:   01/02/24 1637 01/02/24 2057 01/03/24 0354 01/03/24 0801  BP: (!) 125/58 (!) 99/50 106/81 128/74  Pulse: (!) 108 97 95 100  Resp: 18 18 16 18   Temp: 100.1 F (37.8 C) 99.2 F (37.3 C) 99.4 F (37.4 C) 99.1 F (37.3 C)  TempSrc: Oral Oral Oral   SpO2: 99% 98% 100% 98%  Weight:      Height:       Physical Exam HENT:     Head: Normocephalic.     Mouth/Throat:     Pharynx: No oropharyngeal exudate.  Eyes:  General: Lids are normal.  Cardiovascular:     Rate and Rhythm: Normal rate and regular rhythm.     Heart sounds: Normal heart sounds, S1 normal and S2 normal.  Pulmonary:     Breath sounds: Examination of the right-lower field reveals decreased breath sounds. Examination of the left-lower field reveals decreased breath sounds. Decreased breath sounds present. No wheezing, rhonchi or rales.  Abdominal:     Palpations: Abdomen is soft.     Tenderness: There is no abdominal tenderness.  Musculoskeletal:     Right lower leg: No swelling.     Left lower leg: No swelling.  Skin:    General: Skin is warm.      Findings: No rash.  Neurological:     Mental Status: She is alert and oriented to person, place, and time.     Data Reviewed: Creatinine 1.68, hemoglobin 9.0, platelet count 88 Family Communication: Spoke with daughters at the bedside  Disposition: Status is: Inpatient Remains inpatient appropriate because: Try to get sleeping tonight.  Reevaluate tomorrow.  Planned Discharge Destination: Home with Home Health    Time spent: 28 minutes  Author: Charlie Patterson, MD 01/03/2024 1:49 PM  For on call review [email or web address].  "

## 2024-01-03 NOTE — Progress Notes (Signed)
 Occupational Therapy Treatment Patient Details Name: Laura Ferguson MRN: [id number] DOB: August 22, 1932 Today's Date: 01/03/2024   History of present illness Pt is a 89 y.o. female presents s/p fall resulting in a R intertrochanteric fracture, comminuted and L non displaced superior pubic ramus fx. S/p R hip IM nail. PMH of CAD status post CABG in 2009, hypertension, mild neurocognitive impairment, glaucoma and type 2 diabetes.   OT comments  Pt is supine in bed on arrival. Easily arousable and agreeable to OT session. She has mild/moderate pain in RLE more so with weight bearing. Pt performed bed mobility with Mod A, cues for hand placement and use of bed features. Urgent urination need, required Mod/Max A to stand from elevated bed height to RW and extensive Max/Total A to perform SPT from bed<>BSC during session. Pt continues to be limited by RLE pain and dizziness. She will require extensive DME and assist to safely return home, discussed this at length with 2 daughters in the room. Pt returned to bed with all needs in place and will cont to require skilled acute OT services to maximize her safety and IND to return to PLOF.       If plan is discharge home, recommend the following:  A lot of help with walking and/or transfers;A lot of help with bathing/dressing/bathroom;Help with stairs or ramp for entrance;Assistance with cooking/housework;Two people to help with walking and/or transfers   Equipment Recommendations  BSC/3in1;Wheelchair (measurements OT);Hospital bed    Recommendations for Other Services      Precautions / Restrictions Precautions Precautions: Fall Recall of Precautions/Restrictions: Intact Restrictions Weight Bearing Restrictions Per Provider Order: Yes RLE Weight Bearing Per Provider Order: Weight bearing as tolerated       Mobility Bed Mobility Overal bed mobility: Needs Assistance Bed Mobility: Supine to Sit, Sit to Supine Rolling: Mod assist Sidelying to sit:  Min assist, Mod assist, Used rails   Sit to supine: Max assist   General bed mobility comments: cues for hand placement and technique to exit L side of the bed, BLE management, but able to assist with trunkal elevation; Max A to return to supine    Transfers Overall transfer level: Needs assistance Equipment used: Rolling walker (2 wheels) Transfers: Sit to/from Stand, Bed to chair/wheelchair/BSC Sit to Stand: Mod assist, From elevated surface, Max assist Stand pivot transfers: Max assist Squat pivot transfers: Total assist, Max assist       General transfer comment: increased fatigue this afternoon however requesting to utilize [facility] for urgent urination; able to stand with Mod/Max A and cues for hand placement, pt unable to tolerate weight bearing and weight shifting through RLE significant L lateral lean and buckling during transfer requiring extensive assist     Balance Overall balance assessment: Needs assistance, Mild deficits observed, not formally tested Sitting-balance support: Feet supported, Bilateral upper extremity supported Sitting balance-Leahy Scale: Fair     Standing balance support: Bilateral upper extremity supported, During functional activity, Reliant on assistive device for balance Standing balance-Leahy Scale: Poor Standing balance comment: heavy UE reliance on RW                           ADL either performed or assessed with clinical judgement   ADL Overall ADL's : Needs assistance/impaired                         Toilet Transfer: Maximal assistance;Total assistance;Rolling walker (2 wheels);Squat-pivot;Stand-pivot;Cueing for safety;Cueing  for sequencing Toilet Transfer Details (indicate cue type and reason): towards R side to get to [facility] and towards L side to return to bed, unable to bear weight through RLE significant L lateral lean Toileting- Clothing Manipulation and Hygiene: Sitting/lateral lean;Minimal assistance Toileting -  Clothing Manipulation Details (indicate cue type and reason): min a for balance via lateral lean to perform peri-care after urination on toilet            Extremity/Trunk Assessment              Vision       Perception     Praxis     Communication Communication Communication: No apparent difficulties   Cognition Arousal: Alert Behavior During Therapy: WFL for tasks assessed/performed                                 Following commands: Intact        Cueing   Cueing Techniques: Verbal cues  Exercises      Shoulder Instructions       General Comments further education with daughters on DME and appropriate ADL techniques to manage pt at home safely    Pertinent Vitals/ Pain       Pain Assessment Pain Assessment: Faces Faces Pain Scale: Hurts little more Pain Location: R Hip Pain Descriptors / Indicators: Aching, Discomfort Pain Intervention(s): Monitored during session, Repositioned, Limited activity within patient's tolerance  Home Living                                          Prior Functioning/Environment              Frequency  Min 2X/week        Progress Toward Goals  OT Goals(current goals can now be found in the care plan section)  Progress towards OT goals: Progressing toward goals  Acute Rehab OT Goals Patient Stated Goal: get better OT Goal Formulation: With patient/family Time For Goal Achievement: 01/16/24 Potential to Achieve Goals: Fair  Plan      Co-evaluation        PT goals addressed during session: Mobility/safety with mobility;Balance;Proper use of DME;Strengthening/ROM        AM-PAC OT 6 Clicks Daily Activity     Outcome Measure   Help from another person eating meals?: A Little Help from another person taking care of personal grooming?: A Little Help from another person toileting, which includes using toliet, bedpan, or urinal?: A Lot Help from another person bathing  (including washing, rinsing, drying)?: A Lot Help from another person to put on and taking off regular upper body clothing?: A Little Help from another person to put on and taking off regular lower body clothing?: A Lot 6 Click Score: 15    End of Session Equipment Utilized During Treatment: Gait belt;Rolling walker (2 wheels)  OT Visit Diagnosis: Other abnormalities of gait and mobility (R26.89);Muscle weakness (generalized) (M62.81);Unsteadiness on feet (R26.81)   Activity Tolerance Patient limited by pain;Treatment limited secondary to medical complications (Comment) (dizziness)   Patient Left in bed;with call bell/phone within reach;with bed alarm set;with family/visitor present   Nurse Communication Mobility status        Time: 8361-8283 OT Time Calculation (min): 38 min  Charges: OT General Charges $OT Visit: 1 Visit OT Treatments $Self Care/Home Management :  23-37 mins $Therapeutic Activity: 8-22 mins  Jamason Peckham Chrismon, OTR/L  01/03/2024, 5:40 PM   Jevon Shells E Chrismon 01/03/2024, 5:37 PM

## 2024-01-03 NOTE — Progress Notes (Unsigned)
 Compliance Packaging via [facility]   Reviewed with daughter Thomasine) the process of pill packs through [facility]. Provided her with [facility] information via patient's mychart.   Most medications were refilled x90 days recently (between late Ronks) meaning refills will be due ~Jan-Feb for most chronic meds.   In the meantime, will work on getting refills sent to [facility] pharmacy so they are available when she starts to run low in 2026.   Refill to [facility]: PCP-prescribed medications: Tradjenta  5 mg daily  Sertraline  25 mg/day Vitamin D  50 mcg (2000 IU) - OTC but [facility] states if a prescription is received, they may be able to packing in pill packs   [facility]: Ezetimibe  10 mg/day Yvette Ferrier, PA-C - [facility]) Losartan  100 mg [place], Debby LABOR, MD - [facility]) Metoprolol  succinate 50 mg/day Yvette Ferrier, PA-C - [facility]) Pantoprazole  40 mg/day Georgia, Debby LABOR, MD - [facility]) Aspirin  81 mg/day ([facility]) - if prescribed, can include in pill packs  Rosuvastatin  10 mg/day Georgia, Debby LABOR, MD - [facility])  PLAN Refill request sent to PCP  [facility] refill pool messaged regarding refill of chronic meds to [facility] for pill packs Chart forwarded to [facility] pill pack team   Follow up: Daughters have direct line for questions/concerns Reminder set for mid-Jan to follow up on refill progress/due dates.

## 2024-01-03 NOTE — Assessment & Plan Note (Signed)
 Could be postoperative fever versus fever after blood.  Chest x-ray negative.  So far blood culture is negative.  Asked nursing staff to send off her urine.

## 2024-01-04 DIAGNOSIS — D62 Acute posthemorrhagic anemia: Secondary | ICD-10-CM | POA: Diagnosis not present

## 2024-01-04 DIAGNOSIS — E1165 Type 2 diabetes mellitus with hyperglycemia: Secondary | ICD-10-CM | POA: Diagnosis not present

## 2024-01-04 DIAGNOSIS — S72001S Fracture of unspecified part of neck of right femur, sequela: Secondary | ICD-10-CM | POA: Diagnosis not present

## 2024-01-04 DIAGNOSIS — N179 Acute kidney failure, unspecified: Secondary | ICD-10-CM | POA: Diagnosis not present

## 2024-01-04 DIAGNOSIS — I951 Orthostatic hypotension: Secondary | ICD-10-CM | POA: Diagnosis not present

## 2024-01-04 LAB — BASIC METABOLIC PANEL WITH GFR
Anion gap: 9 (ref 5–15)
BUN: 49 mg/dL — ABNORMAL HIGH (ref 8–23)
CO2: 22 mmol/L (ref 22–32)
Calcium: 8.5 mg/dL — ABNORMAL LOW (ref 8.9–10.3)
Chloride: 106 mmol/L (ref 98–111)
Creatinine, Ser: 1.61 mg/dL — ABNORMAL HIGH (ref 0.44–1.00)
GFR, Estimated: 30 mL/min — ABNORMAL LOW
Glucose, Bld: 237 mg/dL — ABNORMAL HIGH (ref 70–99)
Potassium: 4.3 mmol/L (ref 3.5–5.1)
Sodium: 137 mmol/L (ref 135–145)

## 2024-01-04 LAB — GLUCOSE, CAPILLARY
Glucose-Capillary: 187 mg/dL — ABNORMAL HIGH (ref 70–99)
Glucose-Capillary: 203 mg/dL — ABNORMAL HIGH (ref 70–99)
Glucose-Capillary: 106 mg/dL — ABNORMAL HIGH (ref 70–99)
Glucose-Capillary: 146 mg/dL — ABNORMAL HIGH (ref 70–99)

## 2024-01-04 LAB — HEMOGLOBIN
Hemoglobin: 8.1 g/dL — ABNORMAL LOW (ref 12.0–15.0)
Hemoglobin: 8.1 g/dL — ABNORMAL LOW (ref 12.0–15.0)

## 2024-01-04 MED ORDER — SODIUM CHLORIDE 0.9 % IV BOLUS
500.0000 mL | Freq: Once | INTRAVENOUS | Status: AC
  Administered 2024-01-04: 500 mL via INTRAVENOUS

## 2024-01-04 MED ORDER — INSULIN GLARGINE 100 UNIT/ML ~~LOC~~ SOLN
23.0000 [IU] | Freq: Every day | SUBCUTANEOUS | Status: DC
  Filled 2024-01-04: qty 0.23

## 2024-01-04 MED ORDER — SODIUM CHLORIDE 0.9 % IV SOLN: INTRAVENOUS | Status: DC

## 2024-01-04 MED ORDER — HYDROCODONE-ACETAMINOPHEN 5-325 MG PO TABS: 1.0000 | ORAL_TABLET | ORAL | Status: DC | PRN

## 2024-01-04 NOTE — Progress Notes (Signed)
 Physical Therapy Treatment Patient Details Name: Laura Ferguson MRN: [id number] DOB: [birth year] Today's Date: 01/04/2024   History of Present Illness Pt is a 89 y.o. female presents s/p fall resulting in a R intertrochanteric fracture, comminuted and L non displaced superior pubic ramus fx. S/p R hip IM nail. PMH of CAD status post CABG in 2009, hypertension, mild neurocognitive impairment, glaucoma and type 2 diabetes.    PT Comments  Pt was long sitting in bed upon arrival with supportive daughters at bedside. Pt is alert and was premedicated for pain prior to session. Resting BP in bed 110/55. She does not endorse dizziness at rest. In sitting, dropped down to 94/54 but remained asymptomatic .BP was down to 87/46 (58) with standing. Pt does endorse dizziness in standing. Returned to seated position with remaining slightly dizzy. MD made aware. Bolus ordered. Pt was repositioned in bed post session with call bell in reach and family still present. DC recs remain appropriate however family still considering home with [facility] when medically cleared to DC from acute hospital. Acute PT will return tomorrow.     If plan is discharge home, recommend the following: A lot of help with walking and/or transfers;A lot of help with bathing/dressing/bathroom;Help with stairs or ramp for entrance     Equipment Recommendations  Hospital bed;BSC/3in1;Rolling walker (2 wheels);Wheelchair (measurements PT);Wheelchair cushion (measurements PT)       Precautions / Restrictions Precautions Precautions: Fall Recall of Precautions/Restrictions: Intact Restrictions Weight Bearing Restrictions Per Provider Order: Yes RLE Weight Bearing Per Provider Order: Weight bearing as tolerated     Mobility  Bed Mobility Overal bed mobility: Needs Assistance Bed Mobility: Supine to Sit, Sit to Supine Rolling: Mod assist Sidelying to sit: Mod assist, Max assist, Used rails Supine to sit: Mod assist, Max assist, Used rails Sit  to supine: Max assist   Transfers Overall transfer level: Needs assistance Equipment used: Rolling walker (2 wheels) Transfers: Sit to/from Stand Sit to Stand: Mod assist, From elevated surface, Max assist  General transfer comment: Pt stood EOB 1 x for ~ 15 sec but c/o severe dizziness and needed to return to sitting. BP dropped to 87/46 (58)    Ambulation/Gait    General Gait Details: unable due to poor static standing tolerance    Balance Overall balance assessment: Needs assistance, Mild deficits observed, not formally tested Sitting-balance support: Feet supported, Bilateral upper extremity supported Sitting balance-Leahy Scale: Fair     Standing balance support: Bilateral upper extremity supported, During functional activity, Reliant on assistive device for balance Standing balance-Leahy Scale: Poor      Communication Communication Communication: No apparent difficulties  Cognition Arousal: Alert Behavior During Therapy: WFL for tasks assessed/performed   PT - Cognitive impairments: History of cognitive impairments      PT - Cognition Comments: pt is alert and able to follow commands however HOH with baseline dementia MRI ordered prior to admission. per daughters, seems close to her baseline cognitively Following commands: Intact      Cueing Cueing Techniques: Verbal cues, Tactile cues     General Comments General comments (skin integrity, edema, etc.): Session greatly limited by orthostatic hypotension.      Pertinent Vitals/Pain Pain Assessment Pain Assessment: 0-10 Pain Score: 4  Pain Location: R Hip Pain Descriptors / Indicators: Aching, Discomfort Pain Intervention(s): Limited activity within patient's tolerance, Monitored during session, Premedicated before session, Repositioned     PT Goals (current goals can now be found in the care plan section) Acute Rehab PT  Goals Patient Stated Goal: get better Progress towards PT goals: Not progressing toward  goals - comment    Frequency    7X/week           Co-evaluation     PT goals addressed during session: Mobility/safety with mobility;Balance;Proper use of DME;Strengthening/ROM        AM-PAC PT 6 Clicks Mobility   Outcome Measure  Help needed turning from your back to your side while in a flat bed without using bedrails?: A Lot Help needed moving from lying on your back to sitting on the side of a flat bed without using bedrails?: A Lot Help needed moving to and from a bed to a chair (including a wheelchair)?: A Lot Help needed standing up from a chair using your arms (e.g., wheelchair or bedside chair)?: A Lot Help needed to walk in hospital room?: A Lot Help needed climbing 3-5 steps with a railing? : Total 6 Click Score: 11    End of Session Equipment Utilized During Treatment: Gait belt Activity Tolerance: Patient tolerated treatment well;Patient limited by pain Patient left: in bed;with bed alarm set;with family/visitor present Nurse Communication: Mobility status PT Visit Diagnosis: Other abnormalities of gait and mobility (R26.89)     Time: 9149-9077 PT Time Calculation (min) (ACUTE ONLY): 32 min  Charges:    $Therapeutic Activity: 23-37 mins PT General Charges $$ ACUTE PT VISIT: 1 Visit                     Rankin Essex PTA 01/04/2024, 1:11 PM

## 2024-01-04 NOTE — Progress Notes (Signed)
" ° °  Subjective: 3 Days Post-Op Procedures (LRB): FIXATION, FRACTURE, INTERTROCHANTERIC, WITH INTRAMEDULLARY ROD (Right) Patient reports pain as mild.   Pain well controlled. No complaints. We will continue therapy today.    Objective: Vital signs in last 24 hours: Temp:  [98.5 F (36.9 C)-99.5 F (37.5 C)] 99.3 F (37.4 C) (12/20 0420) Pulse Rate:  [100-110] 108 (12/20 0420) Resp:  [16-18] 16 (12/20 0420) BP: (108-128)/(50-74) 120/57 (12/20 0420) SpO2:  [96 %-99 %] 96 % (12/20 0420) Weight:  [58.2 kg] 58.2 kg (12/20 0500)  Intake/Output from previous day: No intake/output data recorded. Intake/Output this shift: No intake/output data recorded.  Recent Labs    01/01/24 0913 01/02/24 0757 01/03/24 0534  HGB 9.8* 7.7* 9.0*   Recent Labs    01/02/24 0757 01/03/24 0534  WBC 8.4 7.1  RBC 2.48* 2.91*  HCT 23.2* 26.6*  PLT 88* 88*   Recent Labs    01/02/24 0757 01/03/24 0534  NA 136 139  K 5.0 4.2  CL 105 107  CO2 20* 22  BUN 42* 44*  CREATININE 1.99* 1.68*  GLUCOSE 229* 216*  CALCIUM  8.2* 8.6*   No results for input(s): LABPT, INR in the last 72 hours.  EXAM General - Patient is Alert and Appropriate Extremity - Sensation intact distally Compartment soft Dressing - dressing C/D/I and no drainage Motor Function - intact, moving foot and toes well on exam.  Ambulated to the recliner  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   LIMA to LAD, SVG to ramus intermedius, SVG to OM1 & OM2, SVG to acute marginal, SVG to distal RCA   Type 2 diabetes mellitus (HCC)     Assessment/Plan:   3 Days  Post-Op Procedures (LRB): FIXATION, FRACTURE, INTERTROCHANTERIC, WITH INTRAMEDULLARY ROD (Right) Principal Problem:   Closed fracture of right hip (HCC) Active Problems:   Essential hypertension   Hyperlipidemia, unspecified   Uncontrolled type 2 diabetes mellitus with hyperglycemia, with long-term current use of insulin  (HCC)   Acute kidney injury superimposed on CKD   Shortness of breath   Postoperative anemia due to acute blood loss   Abnormal chest x-ray   Low grade fever  Estimated body mass index is 23.47 kg/m as calculated from the following:   Height as of this encounter: 5' 2 (1.575 m).   Weight as of this encounter: 58.2 kg. Advance diet Up with therapy Pain well controlled VSS Acute post op blood loss anemia - Hgb 9.0, s/p 1 unit PRBC 12/18 Recheck labs in the am CM to assist with discharge to home with HHPT   Follow up with [facility] in 2 weeks Lovenox  30 mg SQ daily x 14 days at discharge   DVT Prophylaxis - Lovenox , TED hose, and SCDs Weight-Bearing as tolerated to right leg   Krystal Doyne, PA-C [facility] 01/04/2024, 6:29 AM   "

## 2024-01-04 NOTE — Plan of Care (Signed)
  Problem: Education: Goal: Knowledge of General Education information will improve Description: Including pain rating scale, medication(s)/side effects and non-pharmacologic comfort measures Outcome: Progressing   Problem: Clinical Measurements: Goal: Ability to maintain clinical measurements within normal limits will improve Outcome: Progressing   Problem: Clinical Measurements: Goal: Ability to maintain clinical measurements within normal limits will improve Outcome: Progressing Goal: Will remain free from infection Outcome: Progressing Goal: Diagnostic test results will improve Outcome: Progressing Goal: Respiratory complications will improve Outcome: Progressing Goal: Cardiovascular complication will be avoided Outcome: Progressing

## 2024-01-04 NOTE — Discharge Instructions (Signed)
 INSTRUCTIONS AFTER Surgery  Remove items at home which could result in a fall. This includes throw rugs or furniture in walking pathways ICE to the affected joint every three hours while awake for 30 minutes at a time, for at least the first 3-5 days, and then as needed for pain and swelling.  Continue to use ice for pain and swelling. You may notice swelling that will progress down to the foot and ankle.  This is normal after surgery.  Elevate your leg when you are not up walking on it.   Continue to use the breathing machine you got in the hospital (incentive spirometer) which will help keep your temperature down.  It is common for your temperature to cycle up and down following surgery, especially at night when you are not up moving around and exerting yourself.  The breathing machine keeps your lungs expanded and your temperature down.   DIET:  As you were doing prior to hospitalization, we recommend a well-balanced diet.  DRESSING / WOUND CARE / SHOWERING  Dressing change as needed.  No showering.  Follow-up at [facility] clinic orthopedics in 2 weeks for staple removal and x-rays of the right hip.  ACTIVITY  Increase activity slowly as tolerated, but follow the weight bearing instructions below.   No driving for 6 weeks or until further direction given by your physician.  You cannot drive while taking narcotics.  No lifting or carrying greater than 10 lbs. until further directed by your surgeon. Avoid periods of inactivity such as sitting longer than an hour when not asleep. This helps prevent blood clots.  You may return to work once you are authorized by your doctor.     WEIGHT BEARING  Weightbearing as tolerated on the right   EXERCISES Gait training and ambulation training  CONSTIPATION  Constipation is defined medically as fewer than three stools per week and severe constipation as less than one stool per week.  Even if you have a regular bowel pattern at home, your normal  regimen is likely to be disrupted due to multiple reasons following surgery.  Combination of anesthesia, postoperative narcotics, change in appetite and fluid intake all can affect your bowels.   YOU MUST use at least one of the following options; they are listed in order of increasing strength to get the job done.  They are all available over the counter, and you may need to use some, POSSIBLY even all of these options:    Drink plenty of fluids (prune juice may be helpful) and high fiber foods Colace 100 mg by mouth twice a day  Senokot for constipation as directed and as needed Dulcolax (bisacodyl), take with full glass of water  Miralax  (polyethylene glycol) once or twice a day as needed.  If you have tried all these things and are unable to have a bowel movement in the first 3-4 days after surgery call either your surgeon or your primary doctor.    If you experience loose stools or diarrhea, hold the medications until you stool forms back up.  If your symptoms do not get better within 1 week or if they get worse, check with your doctor.  If you experience the worst abdominal pain ever or develop nausea or vomiting, please contact the office immediately for further recommendations for treatment.   ITCHING:  If you experience itching with your medications, try taking only a single pain pill, or even half a pain pill at a time.  You can also use  Benadryl over the counter for itching or also to help with sleep.   TED HOSE STOCKINGS:  Use stockings on both legs until for at least 2 weeks or as directed by physician office. They may be removed at night for sleeping.  MEDICATIONS:  See your medication summary on the After Visit Summary that nursing will review with you.  You may have some home medications which will be placed on hold until you complete the course of blood thinner medication.  It is important for you to complete the blood thinner medication as prescribed.  PRECAUTIONS:  If you  experience chest pain or shortness of breath - call 911 immediately for transfer to the hospital emergency department.   If you develop a fever greater that 101 F, purulent drainage from wound, increased redness or drainage from wound, foul odor from the wound/dressing, or calf pain - CONTACT YOUR SURGEON.                                                   FOLLOW-UP APPOINTMENTS:  If you do not already have a post-op appointment, please call the office for an appointment to be seen by your surgeon.  Guidelines for how soon to be seen are listed in your After Visit Summary, but are typically between 1-4 weeks after surgery.  OTHER INSTRUCTIONS:     MAKE SURE YOU:  Understand these instructions.  Get help right away if you are not doing well or get worse.    Thank you for letting us  be a part of your medical care team.  It is a privilege we respect greatly.  We hope these instructions will help you stay on track for a fast and full recovery!

## 2024-01-04 NOTE — Progress Notes (Signed)
 " Progress Note   Patient: Laura Ferguson FMW:[id number] DOB: 1932-06-25 DOA: 12/31/2023     4 DOS: the patient was seen and examined on 01/04/2024   Brief hospital course: 89 y.o. female with medical history significant of CAD status post CABG in 2009, hypertension, mild neurocognitive impairment, glaucoma and type 2 diabetes.   Patient states that she was getting out of the car after returning home from an appointment with ophthalmology for treatment of her glaucoma.  She states that she was getting out of the car and that she was holding onto the car door but it moved away from her causing her to fall backwards onto concrete, striking the back of her head and R hip. She denies LOC or dizziness/lightheadedness. She noted immediate pain in her R hip and could not bear pain. She was brought in via EMS.    She denies any difficulty eating or drinking, chest pain or palpitations, dyspnea, constipation or diarrhea.   Of note, patient per chart review has a history of vertigo and follows with ENT.  She has also had some balance issues.  She recently saw neurology for this  01/01/2024.  Patient seen this morning prior to the operating room. 12/18.  Patient's hemoglobin today dropped down to 7.7.  Patient felt a little lightheaded with moving around with physical therapy.  Will give a unit of blood case discussed with orthopedic surgery. 12/19.  Patient did not sleep last night and was a little confused this morning but following commands.  Hemoglobin up to 9.0, creatinine down to 1.68.  Patient advised to eat and drink. 12/20.  Patient orthostatic today with physical therapy.  Fluid bolus ordered.  Hold Toprol  today.  Will check hemoglobin again later on today and if lower may end up needing another unit of blood  Assessment and Plan: * Orthostatic hypotension Blood pressure dropped down with walking with physical therapy.  Will give a fluid bolus.  Hemoglobin down to 8.1.  Patient may end up needing  another transfusion.  Hold Toprol .  Closed fracture of right hip [facility]) Dr. Lorelle took to the operating room on 12/17 for right hip intramedullary nail for right hip fracture.  Family would like to take the patient home after hospitalization.  Postoperative anemia due to acute blood loss Patient given 1 unit of packed red blood cells earlier in the hospital course.  Last hemoglobin 8.1.  Acute kidney injury superimposed on CKD AKI on CKD stage IIIb.  Creatinine 1.2 on admission and up to 2.2 at its peak.  Today's creatinine 1.61.   Uncontrolled type 2 diabetes mellitus with hyperglycemia, with long-term current use of insulin  (HCC) Last hemoglobin A1c 8.6.  Sugars starting to elevate.  Will increase Lantus  insulin  to 23 units daily.  Continue sliding scale.  Hyperlipidemia, unspecified On Crestor  and Zetia   Low grade fever Could be postoperative fever versus fever after blood.  Chest x-ray negative.  So far blood culture is negative.    Shortness of breath Resolved        Subjective: Patient feels okay.  Was orthostatic with physical therapy today.  Fluid bolus ordered.  Hold on disposition today.  Hemoglobin drifted down to 8.1 will check another hemoglobin later.  Physical Exam: Vitals:   01/03/24 2036 01/04/24 0420 01/04/24 0500 01/04/24 0834  BP: (!) 108/50 (!) 120/57  123/72  Pulse: (!) 110 (!) 108  (!) 102  Resp: 18 16  19   Temp: 99.5 F (37.5 C) 99.3 F (37.4 C)  98.3 F (36.8 C)  TempSrc: Oral Oral    SpO2: 97% 96%  100%  Weight:   58.2 kg   Height:       Physical Exam HENT:     Head: Normocephalic.     Mouth/Throat:     Pharynx: No oropharyngeal exudate.  Eyes:     General: Lids are normal.  Cardiovascular:     Rate and Rhythm: Normal rate and regular rhythm.     Heart sounds: Normal heart sounds, S1 normal and S2 normal.  Pulmonary:     Breath sounds: Examination of the right-lower field reveals decreased breath sounds. Examination of the left-lower  field reveals decreased breath sounds. Decreased breath sounds present. No wheezing, rhonchi or rales.  Abdominal:     Palpations: Abdomen is soft.     Tenderness: There is no abdominal tenderness.  Musculoskeletal:     Right lower leg: No swelling.     Left lower leg: No swelling.     Comments: Patient with difficulty with straight leg raise with right leg  Skin:    General: Skin is warm.     Findings: No rash.  Neurological:     Mental Status: She is alert and oriented to person, place, and time.     Data Reviewed: Creatinine 1.61, hemoglobin 8.1 Family Communication: Daughters is at bedside  Disposition: Status is: Inpatient Remains inpatient appropriate because:   Planned Discharge Destination: Home with Home Health    Time spent: 29 minutes Case discussed with nursing staff and physical therapy  Author: Charlie Patterson, MD 01/04/2024 2:13 PM  For on call review [email or web address].  "

## 2024-01-04 NOTE — Assessment & Plan Note (Addendum)
 Blood pressure dropped down with walking with physical therapy.  Will give a fluid bolus.  Hemoglobin down to 8.1.  Patient may end up needing another transfusion.  Hold Toprol .

## 2024-01-05 DIAGNOSIS — S72001S Fracture of unspecified part of neck of right femur, sequela: Secondary | ICD-10-CM | POA: Diagnosis not present

## 2024-01-05 DIAGNOSIS — E1165 Type 2 diabetes mellitus with hyperglycemia: Secondary | ICD-10-CM | POA: Diagnosis not present

## 2024-01-05 DIAGNOSIS — I951 Orthostatic hypotension: Secondary | ICD-10-CM | POA: Diagnosis not present

## 2024-01-05 DIAGNOSIS — N179 Acute kidney failure, unspecified: Secondary | ICD-10-CM | POA: Diagnosis not present

## 2024-01-05 DIAGNOSIS — D62 Acute posthemorrhagic anemia: Secondary | ICD-10-CM | POA: Diagnosis not present

## 2024-01-05 LAB — BASIC METABOLIC PANEL WITH GFR
Anion gap: 10 (ref 5–15)
BUN: 48 mg/dL — ABNORMAL HIGH (ref 8–23)
CO2: 22 mmol/L (ref 22–32)
Calcium: 8.3 mg/dL — ABNORMAL LOW (ref 8.9–10.3)
Chloride: 109 mmol/L (ref 98–111)
Creatinine, Ser: 1.41 mg/dL — ABNORMAL HIGH (ref 0.44–1.00)
GFR, Estimated: 35 mL/min — ABNORMAL LOW
Glucose, Bld: 155 mg/dL — ABNORMAL HIGH (ref 70–99)
Potassium: 4.5 mmol/L (ref 3.5–5.1)
Sodium: 140 mmol/L (ref 135–145)

## 2024-01-05 LAB — CBC
HCT: 22 % — ABNORMAL LOW (ref 36.0–46.0)
Hemoglobin: 7.3 g/dL — ABNORMAL LOW (ref 12.0–15.0)
MCH: 31.3 pg (ref 26.0–34.0)
MCHC: 33.2 g/dL (ref 30.0–36.0)
MCV: 94.4 fL (ref 80.0–100.0)
Platelets: 102 K/uL — ABNORMAL LOW (ref 150–400)
RBC: 2.33 MIL/uL — ABNORMAL LOW (ref 3.87–5.11)
RDW: 13.2 % (ref 11.5–15.5)
WBC: 4.6 K/uL (ref 4.0–10.5)
nRBC: 0 % (ref 0.0–0.2)

## 2024-01-05 LAB — GLUCOSE, CAPILLARY
Glucose-Capillary: 162 mg/dL — ABNORMAL HIGH (ref 70–99)
Glucose-Capillary: 180 mg/dL — ABNORMAL HIGH (ref 70–99)
Glucose-Capillary: 163 mg/dL — ABNORMAL HIGH (ref 70–99)
Glucose-Capillary: 143 mg/dL — ABNORMAL HIGH (ref 70–99)

## 2024-01-05 LAB — HEMOGLOBIN: Hemoglobin: 9.9 g/dL — ABNORMAL LOW (ref 12.0–15.0)

## 2024-01-05 MED ORDER — INSULIN GLARGINE 100 UNIT/ML ~~LOC~~ SOLN
20.0000 [IU] | Freq: Every day | SUBCUTANEOUS | Status: DC
  Administered 2024-01-05 – 2024-01-06 (×2): 20 [IU] via SUBCUTANEOUS
  Filled 2024-01-05 (×2): qty 0.2

## 2024-01-05 MED ORDER — SODIUM CHLORIDE 0.9% IV SOLUTION: Freq: Once | INTRAVENOUS | Status: AC

## 2024-01-05 NOTE — Plan of Care (Signed)

## 2024-01-05 NOTE — Progress Notes (Signed)
 Physical Therapy Treatment Patient Details Name: Laura Ferguson MRN: [id number] DOB: 05/04/1932 Today's Date: 01/05/2024   History of Present Illness Pt is a 89 y.o. female presents s/p fall resulting in a R intertrochanteric fracture, comminuted and L non displaced superior pubic ramus fx. S/p R hip IM nail. PMH of CAD status post CABG in 2009, hypertension, mild neurocognitive impairment, glaucoma and type 2 diabetes.    PT Comments  Pt in chair.  Has been up since 8am per pt/family.  Blood has completed.  Seen for session with orthostatic vitals which are stable today with no dizziness reported by pt.   Supine 132/59 P 72 Sitting 120/63 P 80 Standing 131/93 P 88 Standing 3 minutes (similar to 1 minute mark but forgot to write down)  Pt continues to need +2 mod assist for standing.  Struggles to position RLE directly under her to support her weight to take any steps today.  She is able to stand for complete vitals but fatigued and unable to progress gait.  She does do seated AAROM.  RN notified of need for pain meds and pt wanting to remain up in chair.    Education with 2 daughters in room.  Stressed that she will need continued +2 assist for all mobility.  Pt will be limited to wheelchair level upon discharge  They state all equipment has been delivered to the home and they are aware of the level of assist she will need and are prepared to provide it.  They are given a written HEP for supine and seated ex.  They are in agreement for recommendation of EMS transport home as transfer in and out of car will be challenging and high risk of injury to pt and family.    Rehab remains recommended for discharge given level of assist needed but family continues to state they feel comfortable and prepared for discharge home.   If plan is discharge home, recommend the following: Help with stairs or ramp for entrance;Two people to help with walking and/or transfers;Two people to help with  bathing/dressing/bathroom;Assistance with cooking/housework;Assist for transportation   Can travel by private vehicle        Equipment Recommendations  Hospital bed;BSC/3in1;Rolling walker (2 wheels);Wheelchair (measurements PT);Wheelchair cushion (measurements PT)    Recommendations for Other Services       Precautions / Restrictions Precautions Precautions: Fall Recall of Precautions/Restrictions: Intact Restrictions Weight Bearing Restrictions Per Provider Order: Yes RLE Weight Bearing Per Provider Order: Weight bearing as tolerated     Mobility  Bed Mobility               General bed mobility comments: in recliner before and after session Patient Response: Cooperative  Transfers Overall transfer level: Needs assistance Equipment used: Rolling walker (2 wheels) Transfers: Sit to/from Stand Sit to Stand: Mod assist, From elevated surface, +2 physical assistance           General transfer comment: uanble to step today - pain limited.  RN aware of need for pain medication    Ambulation/Gait               General Gait Details: unable due to poor static standing tolerance   Stairs             Wheelchair Mobility     Tilt Bed Tilt Bed Patient Response: Cooperative  Modified Rankin (Stroke Patients Only)       Balance Overall balance assessment: Needs assistance, Mild deficits observed, not formally tested  Sitting balance-Leahy Scale: Fair     Standing balance support: Bilateral upper extremity supported, During functional activity, Reliant on assistive device for balance Standing balance-Leahy Scale: Poor Standing balance comment: heavy UE reliance on RW                            Communication Communication Communication: No apparent difficulties  Cognition Arousal: Alert Behavior During Therapy: WFL for tasks assessed/performed   PT - Cognitive impairments: History of cognitive impairments                          Following commands: Intact      Cueing Cueing Techniques: Verbal cues, Tactile cues  Exercises Other Exercises Other Exercises: seated AAROM    General Comments        Pertinent Vitals/Pain Pain Assessment Pain Assessment: Faces Faces Pain Scale: Hurts even more Pain Location: R Hip Pain Descriptors / Indicators: Aching, Discomfort Pain Intervention(s): Limited activity within patient's tolerance, Monitored during session, Repositioned, Patient requesting pain meds-RN notified    Home Living                          Prior Function            PT Goals (current goals can now be found in the care plan section) Progress towards PT goals: Progressing toward goals    Frequency    7X/week      PT Plan      Co-evaluation              AM-PAC PT 6 Clicks Mobility   Outcome Measure  Help needed turning from your back to your side while in a flat bed without using bedrails?: A Lot Help needed moving from lying on your back to sitting on the side of a flat bed without using bedrails?: A Lot Help needed moving to and from a bed to a chair (including a wheelchair)?: A Lot Help needed standing up from a chair using your arms (e.g., wheelchair or bedside chair)?: A Lot Help needed to walk in hospital room?: Total Help needed climbing 3-5 steps with a railing? : Total 6 Click Score: 10    End of Session Equipment Utilized During Treatment: Gait belt Activity Tolerance: Patient tolerated treatment well;Patient limited by pain Patient left: in chair;with call bell/phone within reach;with chair alarm set Nurse Communication: Mobility status;Patient requests pain meds PT Visit Diagnosis: Other abnormalities of gait and mobility (R26.89)     Time: 1414-1500 PT Time Calculation (min) (ACUTE ONLY): 46 min  Charges:    $Therapeutic Exercise: 8-22 mins $Therapeutic Activity: 23-37 mins PT General Charges $$ ACUTE PT VISIT: 1 Visit                    Lauraine Gills, PTA 01/05/2024, 3:07 PM

## 2024-01-05 NOTE — Care Plan (Signed)
 Called lab to request lab drawn STAT

## 2024-01-05 NOTE — Progress Notes (Signed)
 PT Cancellation Note  Patient Details Name: Laura Ferguson MRN: [id number] DOB: Jun 06, 1932   Cancelled Treatment:    Reason Eval/Treat Not Completed: Medical issues which prohibited therapy  Pt awaiting blood transfusion.  Will defer therapy session.  Will continue tomorrow as appropriate.   Lauraine Gills 01/05/2024, 1:09 PM

## 2024-01-05 NOTE — Assessment & Plan Note (Signed)
 Holding antihypertensive medications

## 2024-01-05 NOTE — Progress Notes (Signed)
" ° °  Subjective: 4 Days Post-Op Procedures (LRB): FIXATION, FRACTURE, INTERTROCHANTERIC, WITH INTRAMEDULLARY ROD (Right) Patient reports pain as mild.   Pain well controlled. No complaints. We will continue therapy today.    Objective: Vital signs in last 24 hours: Temp:  [98.3 F (36.8 C)-98.9 F (37.2 C)] 98.8 F (37.1 C) (12/21 0601) Pulse Rate:  [70-102] 70 (12/21 0601) Resp:  [16-19] 16 (12/21 0601) BP: (105-126)/(48-72) 111/63 (12/21 0601) SpO2:  [97 %-100 %] 97 % (12/21 0601) Weight:  [52.5 kg] 52.5 kg (12/21 0500)  Intake/Output from previous day: 12/20 0701 - 12/21 0700 In: 942 [P.O.:470; I.V.:472] Out: -  Intake/Output this shift: Total I/O In: 426.7 [I.V.:426.7] Out: -   Recent Labs    01/02/24 0757 01/03/24 0534 01/04/24 0959 01/04/24 1352 01/05/24 0443  HGB 7.7* 9.0* 8.1* 8.1* 7.3*   Recent Labs    01/03/24 0534 01/05/24 0443  WBC 7.1 4.6  RBC 2.91* 2.33*  HCT 26.6* 22.0*  PLT 88* 102*   Recent Labs    01/04/24 0959 01/05/24 0443  NA 137 140  K 4.3 4.5  CL 106 109  CO2 22 22  BUN 49* 48*  CREATININE 1.61* 1.41*  GLUCOSE 237* 155*  CALCIUM  8.5* 8.3*   No results for input(s): LABPT, INR in the last 72 hours.  EXAM General - Patient is Alert and Appropriate Extremity - Sensation intact distally Compartment soft Dressing - dressing C/D/I and no drainage Motor Function - intact, moving foot and toes well on exam.  Ambulated to the recliner  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   LIMA to LAD, SVG to ramus intermedius, SVG to OM1 & OM2, SVG to acute marginal, SVG to  distal RCA   Type 2 diabetes mellitus (HCC)     Assessment/Plan:   4 Days Post-Op Procedures (LRB): FIXATION, FRACTURE, INTERTROCHANTERIC, WITH INTRAMEDULLARY ROD (Right) Principal Problem:   Orthostatic hypotension Active Problems:   Essential hypertension   Hyperlipidemia, unspecified   Closed fracture of right hip (HCC)   Uncontrolled type 2 diabetes mellitus with hyperglycemia, with long-term current use of insulin  (HCC)   Acute kidney injury superimposed on CKD   Shortness of breath   Postoperative anemia due to acute blood loss   Low grade fever  Estimated body mass index is 21.17 kg/m as calculated from the following:   Height as of this encounter: 5' 2 (1.575 m).   Weight as of this encounter: 52.5 kg. Advance diet Up with therapy Pain well controlled VSS Acute post op blood loss anemia - Hgb 7.3 this morning, s/p 1 unit PRBC 12/18 Recheck labs in the am CM to assist with discharge to home with HHPT   Follow up with [facility] in 2 weeks Lovenox  30 mg SQ daily x 14 days at discharge   DVT Prophylaxis - Lovenox , TED hose, and SCDs Weight-Bearing as tolerated to right leg   Krystal Doyne, PA-C [facility] 01/05/2024, 6:45 AM   "

## 2024-01-05 NOTE — Progress Notes (Signed)
 " Progress Note   Patient: Laura Ferguson FMW:[id number] DOB: 1932/04/30 DOA: 12/31/2023     5 DOS: the patient was seen and examined on 01/05/2024   Brief hospital course: 89 y.o. female with medical history significant of CAD status post CABG in 2009, hypertension, mild neurocognitive impairment, glaucoma and type 2 diabetes.   Patient states that she was getting out of the car after returning home from an appointment with ophthalmology for treatment of her glaucoma.  She states that she was getting out of the car and that she was holding onto the car door but it moved away from her causing her to fall backwards onto concrete, striking the back of her head and R hip. She denies LOC or dizziness/lightheadedness. She noted immediate pain in her R hip and could not bear pain. She was brought in via EMS.    She denies any difficulty eating or drinking, chest pain or palpitations, dyspnea, constipation or diarrhea.   Of note, patient per chart review has a history of vertigo and follows with ENT.  She has also had some balance issues.  She recently saw neurology for this  01/01/2024.  Patient seen this morning prior to the operating room. 12/18.  Patient's hemoglobin today dropped down to 7.7.  Patient felt a little lightheaded with moving around with physical therapy.  Will give a unit of blood case discussed with orthopedic surgery. 12/19.  Patient did not sleep last night and was a little confused this morning but following commands.  Hemoglobin up to 9.0, creatinine down to 1.68.  Patient advised to eat and drink. 12/20.  Patient orthostatic today with physical therapy.  Fluid bolus ordered.  Hold Toprol  today.  Will check hemoglobin again later on today and if lower may end up needing another unit of blood 12/21.  Hemoglobin down to 7.3.  Will give 1 unit of packed red blood cells.  Assessment and Plan: * Postoperative anemia due to acute blood loss Today's hemoglobin down to 7.3.  Will give 1  unit of packed red blood cells and recheck hemoglobin afterwards.  Patient already received 1 unit earlier on the hospital course.  Orthostatic hypotension Blood pressure dropped down with walking with physical therapy yesterday.  Patient given IV fluids and a fluid bolus and hemoglobin down to 7.3 today will give a unit of blood.  Holding antihypertensive medications.  Closed fracture of right hip [facility]) Dr. Lorelle took to the operating room on 12/17 for right hip intramedullary nail for right hip fracture.  Family would like to take the patient home after hospitalization.  Acute kidney injury superimposed on CKD AKI on CKD stage IIIb.  Creatinine 1.2 on admission and up to 2.2 at its peak.  Today's creatinine 1.41.   Uncontrolled type 2 diabetes mellitus with hyperglycemia, with long-term current use of insulin  (HCC) Last hemoglobin A1c 8.6.  Lantus  20 units daily plus sliding scale.  Essential hypertension Holding antihypertensive medications  Hyperlipidemia, unspecified On Crestor  and Zetia   Low grade fever Could be postoperative fever versus fever after blood.  Chest x-ray negative.  So far blood culture is negative.  Currently afebrile.  Shortness of breath Resolved        Subjective: Patient feels okay.  Patient orthostatic yesterday and today.  Will give a unit of blood.  Patient ate better yesterday.  Physical Exam: Vitals:   01/05/24 0729 01/05/24 0739 01/05/24 1100 01/05/24 1121  BP: (!) 85/48 104/79 115/84 125/62  Pulse: 89 96 87 90  Resp: 18  18 18   Temp:   98.7 F (37.1 C) 98.6 F (37 C)  TempSrc:   Oral Oral  SpO2: 99%  98% 99%  Weight:      Height:       Physical Exam HENT:     Head: Normocephalic.     Mouth/Throat:     Pharynx: No oropharyngeal exudate.  Eyes:     General: Lids are normal.  Cardiovascular:     Rate and Rhythm: Normal rate and regular rhythm.     Heart sounds: Normal heart sounds, S1 normal and S2 normal.  Pulmonary:      Breath sounds: Examination of the right-lower field reveals decreased breath sounds. Examination of the left-lower field reveals decreased breath sounds. Decreased breath sounds present. No wheezing, rhonchi or rales.  Abdominal:     Palpations: Abdomen is soft.     Tenderness: There is no abdominal tenderness.  Musculoskeletal:     Right lower leg: No swelling.     Left lower leg: No swelling.     Comments: Unable to straight leg raise.  Skin:    General: Skin is warm.     Findings: No rash.  Neurological:     Mental Status: She is alert and oriented to person, place, and time.     Data Reviewed: Creatinine 1.41 with a GFR of 35, hemoglobin 7.3, platelet count 102  Family Communication: Family at bedside  Disposition: Status is: Inpatient Remains inpatient appropriate because: Transfer to unit of blood today.  Will check orthostatic vitals and hemoglobin afterwards.  Planned Discharge Destination: Home with home health    Time spent: 28 minutes  Author: Charlie Patterson, MD 01/05/2024 1:27 PM  For on call review [email or web address].  "

## 2024-01-06 ENCOUNTER — Other Ambulatory Visit: Payer: Self-pay

## 2024-01-06 DIAGNOSIS — E785 Hyperlipidemia, unspecified: Secondary | ICD-10-CM | POA: Diagnosis not present

## 2024-01-06 DIAGNOSIS — S329XXA Fracture of unspecified parts of lumbosacral spine and pelvis, initial encounter for closed fracture: Secondary | ICD-10-CM | POA: Insufficient documentation

## 2024-01-06 DIAGNOSIS — D62 Acute posthemorrhagic anemia: Secondary | ICD-10-CM | POA: Diagnosis not present

## 2024-01-06 DIAGNOSIS — N189 Chronic kidney disease, unspecified: Secondary | ICD-10-CM | POA: Diagnosis not present

## 2024-01-06 DIAGNOSIS — Z794 Long term (current) use of insulin: Secondary | ICD-10-CM | POA: Diagnosis not present

## 2024-01-06 DIAGNOSIS — N179 Acute kidney failure, unspecified: Secondary | ICD-10-CM | POA: Diagnosis not present

## 2024-01-06 DIAGNOSIS — R509 Fever, unspecified: Secondary | ICD-10-CM | POA: Diagnosis not present

## 2024-01-06 DIAGNOSIS — S72001S Fracture of unspecified part of neck of right femur, sequela: Secondary | ICD-10-CM | POA: Diagnosis not present

## 2024-01-06 DIAGNOSIS — E1165 Type 2 diabetes mellitus with hyperglycemia: Secondary | ICD-10-CM | POA: Diagnosis not present

## 2024-01-06 DIAGNOSIS — I951 Orthostatic hypotension: Secondary | ICD-10-CM | POA: Diagnosis not present

## 2024-01-06 LAB — TYPE AND SCREEN
ABO/RH(D): O POS
Antibody Screen: NEGATIVE
Unit division: 0

## 2024-01-06 LAB — BPAM RBC
Blood Product Expiration Date: 202601182359
ISSUE DATE / TIME: 202512211056
Unit Type and Rh: 5100

## 2024-01-06 LAB — CBC
HCT: 28.6 % — ABNORMAL LOW (ref 36.0–46.0)
Hemoglobin: 9.6 g/dL — ABNORMAL LOW (ref 12.0–15.0)
MCH: 30.4 pg (ref 26.0–34.0)
MCHC: 33.6 g/dL (ref 30.0–36.0)
MCV: 90.5 fL (ref 80.0–100.0)
Platelets: 128 K/uL — ABNORMAL LOW (ref 150–400)
RBC: 3.16 MIL/uL — ABNORMAL LOW (ref 3.87–5.11)
RDW: 13.6 % (ref 11.5–15.5)
WBC: 3.9 K/uL — ABNORMAL LOW (ref 4.0–10.5)
nRBC: 0 % (ref 0.0–0.2)

## 2024-01-06 LAB — BASIC METABOLIC PANEL WITH GFR
Anion gap: 10 (ref 5–15)
BUN: 33 mg/dL — ABNORMAL HIGH (ref 8–23)
CO2: 22 mmol/L (ref 22–32)
Calcium: 8.7 mg/dL — ABNORMAL LOW (ref 8.9–10.3)
Chloride: 109 mmol/L (ref 98–111)
Creatinine, Ser: 1.08 mg/dL — ABNORMAL HIGH (ref 0.44–1.00)
GFR, Estimated: 49 mL/min — ABNORMAL LOW
Glucose, Bld: 160 mg/dL — ABNORMAL HIGH (ref 70–99)
Potassium: 4.4 mmol/L (ref 3.5–5.1)
Sodium: 140 mmol/L (ref 135–145)

## 2024-01-06 LAB — GLUCOSE, CAPILLARY
Glucose-Capillary: 176 mg/dL — ABNORMAL HIGH (ref 70–99)
Glucose-Capillary: 134 mg/dL — ABNORMAL HIGH (ref 70–99)

## 2024-01-06 MED ORDER — HYDROCODONE-ACETAMINOPHEN 5-325 MG PO TABS: 1.0000 | ORAL_TABLET | Freq: Four times a day (QID) | ORAL | 0 refills | Status: AC | PRN

## 2024-01-06 MED ORDER — ACETAMINOPHEN 325 MG PO TABS: 650.0000 mg | ORAL_TABLET | Freq: Four times a day (QID) | ORAL | Status: AC

## 2024-01-06 MED ORDER — ENOXAPARIN SODIUM 30 MG/0.3ML IJ SOSY: 30.0000 mg | PREFILLED_SYRINGE | INTRAMUSCULAR | 0 refills | Status: DC

## 2024-01-06 MED ORDER — METOPROLOL SUCCINATE ER 25 MG PO TB24: 25.0000 mg | ORAL_TABLET | Freq: Every day | ORAL | 0 refills | Status: DC

## 2024-01-06 MED ORDER — LANTUS SOLOSTAR 100 UNIT/ML ~~LOC~~ SOPN: 20.0000 [IU] | PEN_INJECTOR | Freq: Every day | SUBCUTANEOUS | Status: DC

## 2024-01-06 MED ORDER — METOPROLOL SUCCINATE ER 25 MG PO TB24
25.0000 mg | ORAL_TABLET | Freq: Every day | ORAL | Status: DC
  Administered 2024-01-06: 25 mg via ORAL
  Filled 2024-01-06: qty 1

## 2024-01-06 NOTE — Progress Notes (Signed)
" ° °  Subjective: 5 Days Post-Op Procedures (LRB): FIXATION, FRACTURE, INTERTROCHANTERIC, WITH INTRAMEDULLARY ROD (Right) Patient reports pain as mild.   Patient is well, and has had no acute complaints or problems Denies any CP, SOB, ABD pain. We will continue therapy today.  Plan is to go Home after hospital stay.  Objective: Vital signs in last 24 hours: Temp:  [97.9 F (36.6 C)-99 F (37.2 C)] 99 F (37.2 C) (12/22 0817) Pulse Rate:  [70-107] 104 (12/22 0817) Resp:  [16-20] 16 (12/22 0817) BP: (112-142)/(57-84) 112/57 (12/22 0817) SpO2:  [98 %-100 %] 100 % (12/22 0817) Weight:  [57.3 kg] 57.3 kg (12/22 0500)  Intake/Output from previous day: 12/21 0701 - 12/22 0700 In: 724 [P.O.:390; Blood:334] Out: -  Intake/Output this shift: Total I/O In: 240 [P.O.:240] Out: -   Recent Labs    01/04/24 0959 01/04/24 1352 01/05/24 0443 01/05/24 1416 01/06/24 0615  HGB 8.1* 8.1* 7.3* 9.9* 9.6*   Recent Labs    01/05/24 0443 01/06/24 0615  WBC 4.6 3.9*  RBC 2.33* 3.16*  HCT 22.0* 28.6*  PLT 102* 128*   Recent Labs    01/05/24 0443 01/06/24 0615  NA 140 140  K 4.5 4.4  CL 109 109  CO2 22 22  BUN 48* 33*  CREATININE 1.41* 1.08*  GLUCOSE 155* 160*  CALCIUM  8.3* 8.7*   No results for input(s): LABPT, INR in the last 72 hours.  EXAM General - Patient is Alert, Disorganized, and Oriented Extremity - Sensation intact distally Intact pulses distally Dorsiflexion/Plantar flexion intact No cellulitis present Compartment soft Dressing - dressing C/D/I and no drainage Motor Function - intact, moving foot and toes well on exam.   Past Medical History:  Diagnosis Date   Acute bilateral low back pain with right-sided sciatica 03/16/2022   Acute midline thoracic back pain 03/16/2022   Acute pain of left foot 03/09/2019   CAD (coronary artery disease)    History of nuclear stress test 11/22/2011   bruce myoview; normal pattern of perfusio; post-stress EF 76%; low  risk scan   Hyperlipidemia    Hypertension    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   LIMA to LAD, SVG to ramus intermedius, SVG to OM1 & OM2, SVG to acute marginal, SVG to distal RCA   Type 2 diabetes mellitus (HCC)     Assessment/Plan:   5 Days Post-Op Procedures (LRB): FIXATION, FRACTURE, INTERTROCHANTERIC, WITH INTRAMEDULLARY ROD (Right) Principal Problem:   Postoperative anemia due to acute blood loss Active Problems:   Essential hypertension   Hyperlipidemia, unspecified   Closed fracture of right hip (HCC)   Uncontrolled type 2 diabetes mellitus with hyperglycemia, with long-term current use of insulin  (HCC)   Acute kidney injury superimposed on CKD   Shortness of breath   Low grade fever   Orthostatic hypotension  Estimated body mass index is 23.1 kg/m as calculated from the following:   Height as of this encounter: 5' 2 (1.575 m).   Weight as of this encounter: 57.3 kg. Advance diet Up with therapy Pain well controlled Labs and VSS CM to assist with discharge to home with HHPT today Follow up with [facility] in 2 weeks  DVT Prophylaxis - Lovenox , TED hose, and SCDs Weight-Bearing as tolerated to right leg   T. Medford Amber, PA-C [facility] 01/06/2024, 10:34 AM   "

## 2024-01-06 NOTE — Progress Notes (Signed)
 Physical Therapy Treatment Patient Details Name: Laura Ferguson MRN: [id number] DOB: 15-Feb-1932 Today's Date: 01/06/2024   History of Present Illness Pt is a 89 y.o. female presents s/p fall resulting in a R intertrochanteric fracture, comminuted and L non displaced superior pubic ramus fx. S/p R hip IM nail. PMH of CAD status post CABG in 2009, hypertension, mild neurocognitive impairment, glaucoma and type 2 diabetes.    PT Comments  Co-tx with OT for improved outcomes.  Supportive family in room.  Stated they got pt up to recliner on their own this am and continue to feel good about discharge.  She does stand x 3 during session initially with mod a x 2 but does progress to min a x 2 with improved standing quality and balance.  Orthostatics taken.  Sitting 124/56 P 98, standing 91/69 P 107 and standing 3 minutes 106/69 P 107.  She is asymptomatic today with no c/o dizziness or light headedness.  She participates in pre-gait weight shifting and RLE AROM in standing.  She continues to struggle with true steps and WB on RLE which limits gait but she does fair with shuffle steps to chair.  She is a bit teary today and encouragement is given.     If plan is discharge home, recommend the following: Help with stairs or ramp for entrance;Two people to help with walking and/or transfers;Two people to help with bathing/dressing/bathroom;Assistance with cooking/housework;Assist for transportation   Can travel by private vehicle        Equipment Recommendations  Hospital bed;BSC/3in1;Rolling walker (2 wheels);Wheelchair (measurements PT);Wheelchair cushion (measurements PT)    Recommendations for Other Services       Precautions / Restrictions Precautions Precautions: Fall Recall of Precautions/Restrictions: Intact Restrictions Weight Bearing Restrictions Per Provider Order: Yes RLE Weight Bearing Per Provider Order: Weight bearing as tolerated     Mobility  Bed Mobility                General bed mobility comments: in recliner before and after session - family stated they got her up today to chair    Transfers Overall transfer level: Needs assistance Equipment used: Rolling walker (2 wheels) Transfers: Sit to/from Stand Sit to Stand: Min assist, Mod assist, +2 physical assistance           General transfer comment: unable to take true steps.  shuffles.  transfer improves with repetition and rest.    Ambulation/Gait               General Gait Details: unable to progress to true steps   Stairs             Wheelchair Mobility     Tilt Bed    Modified Rankin (Stroke Patients Only)       Balance Overall balance assessment: Needs assistance, Mild deficits observed, not formally tested Sitting-balance support: Feet supported, Bilateral upper extremity supported Sitting balance-Leahy Scale: Fair     Standing balance support: Bilateral upper extremity supported, During functional activity, Reliant on assistive device for balance Standing balance-Leahy Scale: Poor Standing balance comment: heavy UE reliance on RW, but overall improved.                            Communication Communication Communication: No apparent difficulties  Cognition Arousal: Alert Behavior During Therapy: [facility] for tasks assessed/performed  PT - Cognition Comments: a bit teary today voicing feeling like a burden and frustration over injury and limited mobility.  enocuragement given Following commands: Intact      Cueing Cueing Techniques: Verbal cues, Tactile cues  Exercises      General Comments        Pertinent Vitals/Pain Pain Assessment Pain Assessment: Faces Faces Pain Scale: Hurts little more Pain Location: R Hip Pain Descriptors / Indicators: Aching, Discomfort Pain Intervention(s): Limited activity within patient's tolerance, Monitored during session, Repositioned, Premedicated before session     Home Living                          Prior Function            PT Goals (current goals can now be found in the care plan section) Progress towards PT goals: Progressing toward goals    Frequency    7X/week      PT Plan      Co-evaluation PT/OT/SLP Co-Evaluation/Treatment: Yes Reason for Co-Treatment: Complexity of the patient's impairments (multi-system involvement);For patient/therapist safety;To address functional/ADL transfers PT goals addressed during session: Mobility/safety with mobility;Balance;Proper use of DME OT goals addressed during session: Proper use of Adaptive equipment and DME      AM-PAC PT 6 Clicks Mobility   Outcome Measure  Help needed turning from your back to your side while in a flat bed without using bedrails?: A Lot Help needed moving from lying on your back to sitting on the side of a flat bed without using bedrails?: A Lot Help needed moving to and from a bed to a chair (including a wheelchair)?: A Lot Help needed standing up from a chair using your arms (e.g., wheelchair or bedside chair)?: A Lot Help needed to walk in hospital room?: Total Help needed climbing 3-5 steps with a railing? : Total 6 Click Score: 10    End of Session Equipment Utilized During Treatment: Gait belt Activity Tolerance: Patient tolerated treatment well;Patient limited by pain Patient left: in chair;with call bell/phone within reach;with chair alarm set Nurse Communication: Mobility status PT Visit Diagnosis: Other abnormalities of gait and mobility (R26.89)     Time: 9155-9086 PT Time Calculation (min) (ACUTE ONLY): 29 min  Charges:    $Therapeutic Activity: 8-22 mins PT General Charges $$ ACUTE PT VISIT: 1 Visit                   Lauraine Gills, PTA 01/06/2024, 9:46 AM

## 2024-01-06 NOTE — Assessment & Plan Note (Addendum)
 X-ray commented on probable left superior ramus fracture.  This is nonsurgical.

## 2024-01-06 NOTE — TOC Transition Note (Signed)
 Transition of Care [facility]) - Discharge Note   Patient Details  Name: Laura Ferguson MRN: [id number] Date of Birth: 23-Mar-1932  Transition of Care [facility]) CM/SW Contact:  Lauraine JAYSON Carpen, LCSW Phone Number: 01/06/2024, 10:11 AM   Clinical Narrative:   Patient has orders to discharge home today. [facility] liaison is aware. [facility] has been arranged and she is 2nd on the list. Patient and family confirmed address on file is correct. No further concerns. CSW signing off.  Final next level of care: Home w Home Health Services Barriers to Discharge: Barriers Resolved   Patient Goals and CMS Choice            Discharge Placement                Patient to be transferred to facility by: [facility] Name of family member notified: Famiily at bedside Patient and family notified of of transfer: 01/06/24  Discharge Plan and Services Additional resources added to the After Visit Summary for                  DME Arranged: Community education officer wheelchair with seat cushion, 3-N-1, Hospital bed DME Agency: [employer] Date DME Agency Contacted: 01/06/24   Representative spoke with at DME Agency: [place] HH Arranged: RN, PT, OT, Nurse's Aide HH Agency: [facility] Date [facility] Agency Contacted: 01/06/24   Representative spoke with at [facility] Agency: [place]   Social Drivers of Health (SDOH) Interventions SDOH Screenings   Food Insecurity: No Food Insecurity (12/31/2023)  Recent Concern: Food Insecurity - Food Insecurity Present (12/26/2023)   Received from [facility]  Housing: Low Risk (12/31/2023)  Transportation Needs: No Transportation Needs (12/31/2023)  Utilities: Not At Risk (12/31/2023)  Alcohol Screen: Low Risk (01/22/2023)  Depression (PHQ2-9): Low Risk (11/28/2023)  Recent Concern: Depression (PHQ2-9) - High Risk (09/20/2023)  Financial Resource Strain: Low Risk  (12/26/2023)   Received from [facility]  Physical Activity: Inactive (01/22/2023)  Social Connections: Moderately Isolated (12/31/2023)  Stress: No Stress Concern Present (01/22/2023)  Tobacco Use: Low Risk (12/31/2023)  Health Literacy: Adequate Health Literacy (01/22/2023)     Readmission Risk Interventions     No data to display

## 2024-01-06 NOTE — Progress Notes (Signed)
 Patient discharged home via [facility]. Discharge instructions given to family and patient. All questions answered.

## 2024-01-06 NOTE — Progress Notes (Signed)
 Occupational Therapy Treatment Patient Details Name: Laura Ferguson MRN: [id number] DOB: 10-29-1932 Today's Date: 01/06/2024   History of present illness Pt is a 89 y.o. female presents s/p fall resulting in a R intertrochanteric fracture, comminuted and L non displaced superior pubic ramus fx. S/p R hip IM nail. PMH of CAD status post CABG in 2009, hypertension, mild neurocognitive impairment, glaucoma and type 2 diabetes.   OT comments  Pt seen for OT and PT co-tx. Pt in recliner, family reporting they assisted her from EOB to the recliner earlier. Pt agreeable to session. Tolerated well despite pain with WBing through RLE which limited her ability to take steps. Worked on balance, hand placement on RW, anterior weight shift to prevent posterior LOB, and lateral weight shifts. Pt/family edu in compensatory strategies to support ADL/mobility participation and safety upon return home. Pt/family verbalized understanding. Continue to be eager to take pt home upon discharge.      If plan is discharge home, recommend the following:  A lot of help with walking and/or transfers;A lot of help with bathing/dressing/bathroom;Help with stairs or ramp for entrance;Assistance with cooking/housework;Two people to help with walking and/or transfers   Equipment Recommendations  BSC/3in1;Wheelchair (measurements OT);Hospital bed    Recommendations for Other Services      Precautions / Restrictions Precautions Precautions: Fall Recall of Precautions/Restrictions: Intact Restrictions Weight Bearing Restrictions Per Provider Order: Yes RLE Weight Bearing Per Provider Order: Weight bearing as tolerated       Mobility Bed Mobility               General bed mobility comments: in recliner before and after session - family stated they got her up today to chair    Transfers Overall transfer level: Needs assistance Equipment used: Rolling walker (2 wheels) Transfers: Sit to/from Stand Sit to Stand:  Min assist, Mod assist, +2 physical assistance           General transfer comment: unable to take true steps.  shuffles.  transfer improves with repetition and rest.     Balance Overall balance assessment: Needs assistance, Mild deficits observed, not formally tested Sitting-balance support: Feet supported, Bilateral upper extremity supported Sitting balance-Leahy Scale: Fair     Standing balance support: Bilateral upper extremity supported, During functional activity, Reliant on assistive device for balance Standing balance-Leahy Scale: Poor Standing balance comment: heavy UE reliance on RW, but overall improved.                           ADL either performed or assessed with clinical judgement   ADL Overall ADL's : Needs assistance/impaired                                       General ADL Comments: Pt continues to require MAX A for LB ADL tasks    Extremity/Trunk Assessment              Vision       Perception     Praxis     Communication Communication Communication: No apparent difficulties   Cognition Arousal: Alert Behavior During Therapy: WFL for tasks assessed/performed                                 Following commands: Intact  Cueing   Cueing Techniques: Verbal cues, Tactile cues  Exercises Other Exercises Other Exercises: Pt/family edu in compensatory strategies to support ADL/mobility participation and safety upon return home.    Shoulder Instructions       General Comments      Pertinent Vitals/ Pain       Pain Assessment Pain Assessment: Faces Faces Pain Scale: Hurts little more Pain Location: R Hip Pain Descriptors / Indicators: Aching, Discomfort Pain Intervention(s): Limited activity within patient's tolerance, Monitored during session, Premedicated before session, Repositioned  Home Living                                          Prior Functioning/Environment               Frequency  Min 2X/week        Progress Toward Goals  OT Goals(current goals can now be found in the care plan section)  Progress towards OT goals: Progressing toward goals  Acute Rehab OT Goals Patient Stated Goal: get better OT Goal Formulation: With patient/family Time For Goal Achievement: 01/16/24 Potential to Achieve Goals: Fair  Plan      Co-evaluation    PT/OT/SLP Co-Evaluation/Treatment: Yes Reason for Co-Treatment: Complexity of the patient's impairments (multi-system involvement);For patient/therapist safety;To address functional/ADL transfers PT goals addressed during session: Mobility/safety with mobility;Balance;Proper use of DME OT goals addressed during session: Proper use of Adaptive equipment and DME      AM-PAC OT 6 Clicks Daily Activity     Outcome Measure   Help from another person eating meals?: None Help from another person taking care of personal grooming?: A Little Help from another person toileting, which includes using toliet, bedpan, or urinal?: A Lot Help from another person bathing (including washing, rinsing, drying)?: A Lot Help from another person to put on and taking off regular upper body clothing?: A Little Help from another person to put on and taking off regular lower body clothing?: A Lot 6 Click Score: 16    End of Session Equipment Utilized During Treatment: Gait belt;Rolling walker (2 wheels)  OT Visit Diagnosis: Other abnormalities of gait and mobility (R26.89);Muscle weakness (generalized) (M62.81);Unsteadiness on feet (R26.81)   Activity Tolerance Patient tolerated treatment well;Patient limited by pain   Patient Left in chair;with call bell/phone within reach;with chair alarm set;with family/visitor present   Nurse Communication          Time: 9155-9087 OT Time Calculation (min): 28 min  Charges: OT General Charges $OT Visit: 1 Visit OT Treatments $Therapeutic Activity: 8-22 mins  Warren SAUNDERS.,  MPH, MS, OTR/L ascom [phone] 01/06/2024, 10:48 AM

## 2024-01-06 NOTE — Plan of Care (Signed)
  Problem: Education: Goal: Knowledge of General Education information will improve Description: Including pain rating scale, medication(s)/side effects and non-pharmacologic comfort measures Outcome: Progressing   Problem: Health Behavior/Discharge Planning: Goal: Ability to manage health-related needs will improve Outcome: Progressing   Problem: Clinical Measurements: Goal: Ability to maintain clinical measurements within normal limits will improve Outcome: Progressing Goal: Will remain free from infection Outcome: Progressing Goal: Diagnostic test results will improve Outcome: Progressing Goal: Respiratory complications will improve Outcome: Progressing Goal: Cardiovascular complication will be avoided Outcome: Progressing   Problem: Activity: Goal: Risk for activity intolerance will decrease Outcome: Progressing   Problem: Nutrition: Goal: Adequate nutrition will be maintained Outcome: Progressing   Problem: Coping: Goal: Level of anxiety will decrease Outcome: Progressing   Problem: Elimination: Goal: Will not experience complications related to bowel motility Outcome: Progressing Goal: Will not experience complications related to urinary retention Outcome: Progressing   Problem: Pain Managment: Goal: General experience of comfort will improve and/or be controlled Outcome: Progressing   Problem: Safety: Goal: Ability to remain free from injury will improve Outcome: Progressing   Problem: Skin Integrity: Goal: Risk for impaired skin integrity will decrease Outcome: Progressing   Problem: Education: Goal: Ability to describe self-care measures that may prevent or decrease complications (Diabetes Survival Skills Education) will improve Outcome: Progressing Goal: Individualized Educational Video(s) Outcome: Progressing   Problem: Coping: Goal: Ability to adjust to condition or change in health will improve Outcome: Progressing   Problem: Fluid  Volume: Goal: Ability to maintain a balanced intake and output will improve Outcome: Progressing   Problem: Health Behavior/Discharge Planning: Goal: Ability to identify and utilize available resources and services will improve Outcome: Progressing Goal: Ability to manage health-related needs will improve Outcome: Progressing   Problem: Metabolic: Goal: Ability to maintain appropriate glucose levels will improve Outcome: Progressing   Problem: Nutritional: Goal: Maintenance of adequate nutrition will improve Outcome: Progressing Goal: Progress toward achieving an optimal weight will improve Outcome: Progressing   Problem: Skin Integrity: Goal: Risk for impaired skin integrity will decrease Outcome: Progressing   Problem: Tissue Perfusion: Goal: Adequacy of tissue perfusion will improve Outcome: Progressing   Problem: Education: Goal: Verbalization of understanding the information provided (i.e., activity precautions, restrictions, etc) will improve Outcome: Progressing Goal: Individualized Educational Video(s) Outcome: Progressing   Problem: Activity: Goal: Ability to ambulate and perform ADLs will improve Outcome: Progressing   Problem: Clinical Measurements: Goal: Postoperative complications will be avoided or minimized Outcome: Progressing   Problem: Self-Concept: Goal: Ability to maintain and perform role responsibilities to the fullest extent possible will improve Outcome: Progressing   Problem: Pain Management: Goal: Pain level will decrease Outcome: Progressing

## 2024-01-06 NOTE — TOC Progression Note (Signed)
 Transition of Care [facility]) - Progression Note    Patient Details  Name: Laura Ferguson MRN: [id number] Date of Birth: 02/11/1932  Transition [facility] Care [facility]) CM/SW Contact  Lauraine JAYSON Carpen, LCSW Phone Number: 01/06/2024, 9:05 AM  Clinical Narrative:   [employer] confirmed DME was delivered Saturday.  Expected Discharge Plan and Services         Expected Discharge Date: 01/06/24                                     Social Drivers of Health (SDOH) Interventions SDOH Screenings   Food Insecurity: No Food Insecurity (12/31/2023)  Recent Concern: Food Insecurity - Food Insecurity Present (12/26/2023)   Received from [facility]  Housing: Low Risk (12/31/2023)  Transportation Needs: No Transportation Needs (12/31/2023)  Utilities: Not At Risk (12/31/2023)  Alcohol Screen: Low Risk (01/22/2023)  Depression (PHQ2-9): Low Risk (11/28/2023)  Recent Concern: Depression (PHQ2-9) - High Risk (09/20/2023)  Financial Resource Strain: Low Risk  (12/26/2023)   Received from [facility]  Physical Activity: Inactive (01/22/2023)  Social Connections: Moderately Isolated (12/31/2023)  Stress: No Stress Concern Present (01/22/2023)  Tobacco Use: Low Risk (12/31/2023)  Health Literacy: Adequate Health Literacy (01/22/2023)    Readmission Risk Interventions     No data to display

## 2024-01-06 NOTE — Discharge Summary (Signed)
 " Physician Discharge Summary   Patient: Laura Ferguson MRN: [id number] DOB: 11/13/1932  Admit date:     12/31/2023  Discharge date: 01/06/2024  Discharge Physician: Charlie Patterson   PCP: Gretta Comer POUR, NP   Recommendations at discharge:   Follow-up PCP 5 days Follow-up orthopedic surgery  Discharge Diagnoses: Principal Problem:   Postoperative anemia due to acute blood loss Active Problems:   Closed fracture of right hip (HCC)   Orthostatic hypotension   Acute kidney injury superimposed on CKD   Essential hypertension   Uncontrolled type 2 diabetes mellitus with hyperglycemia, with long-term current use of insulin  (HCC)   Hyperlipidemia, unspecified   Shortness of breath   Low grade fever   Pelvic fracture (HCC) Resolved Problems:   * No resolved hospital problems. [facility] Course: 89 y.o. female with medical history significant of CAD status post CABG in 2009, hypertension, mild neurocognitive impairment, glaucoma and type 2 diabetes.   Patient states that she was getting out of the car after returning home from an appointment with ophthalmology for treatment of her glaucoma.  She states that she was getting out of the car and that she was holding onto the car door but it moved away from her causing her to fall backwards onto concrete, striking the back of her head and R hip. She denies LOC or dizziness/lightheadedness. She noted immediate pain in her R hip and could not bear pain. She was brought in via EMS.    She denies any difficulty eating or drinking, chest pain or palpitations, dyspnea, constipation or diarrhea.   Of note, patient per chart review has a history of vertigo and follows with ENT.  She has also had some balance issues.  She recently saw neurology for this  01/01/2024.  Patient seen this morning prior to the operating room. 12/18.  Patient's hemoglobin today dropped down to 7.7.  Patient felt a little lightheaded with moving around with physical  therapy.  Will give a unit of blood case discussed with orthopedic surgery. 12/19.  Patient did not sleep last night and was a little confused this morning but following commands.  Hemoglobin up to 9.0, creatinine down to 1.68.  Patient advised to eat and drink. 12/20.  Patient orthostatic today with physical therapy.  Fluid bolus ordered.  Hold Toprol  today.  Will check hemoglobin again later on today and if lower may end up needing another unit of blood 12/21.  Hemoglobin down to 7.3.  Will give 1 unit of packed red blood cells. 12/22.  Patient feeling better.  Hemoglobin upon discharge 9.6, creatinine down to 1.08.  Patient does have bruising in her inner right thigh  Assessment and Plan: * Postoperative anemia due to acute blood loss Patient transfused 2 units of packed red blood cells during the hospital course.  Last hemoglobin 9.6.  Recommend checking hemoglobin as outpatient.  Hold aspirin  while on Lovenox .  Patient does have some bruising on the inner right thigh.  Orthostatic hypotension Blood pressure dropped down with walking with physical therapy 2 days ago.  Patient not orthostatic yesterday after transfusion.  Closed fracture of right hip [facility] Dr. Lorelle took to the operating room on 12/17 for right hip intramedullary nail for right hip fracture. Family is taking home with home with home health.  Pain control.  Tylenol  as needed.  Acute kidney injury superimposed on CKD AKI on CKD stage IIIb.  Creatinine 1.2 on admission and up to 2.2 at its peak.  Today's  creatinine 1.08 with a GFR 49.  Uncontrolled type 2 diabetes mellitus with hyperglycemia, with long-term current use of insulin  (HCC) Last hemoglobin A1c 8.6.  Lantus  20 units daily and Glucophage  as outpatient  Essential hypertension Can restart lower dose Toprol -XL.  Hyperlipidemia, unspecified On Crestor  and Zetia   Pelvic fracture (HCC) X-ray commented on probable left superior ramus fracture.  This is  nonsurgical.  Low grade fever Could be postoperative fever versus fever after blood.  Chest x-ray negative.  So far blood cultures are negative.  Currently afebrile.  Shortness of breath Resolved         Consultants: Orthopedic surgery Procedures performed: Right hip intramedullary nail Disposition: Home health Diet recommendation:  Carb modified DISCHARGE MEDICATION: Allergies as of 01/06/2024   No Known Allergies      Medication List     STOP taking these medications    Azelastine HCl 137 MCG/SPRAY Soln   losartan  100 MG tablet Commonly known as: COZAAR    TYLENOL  8 HOUR PO Replaced by: acetaminophen  325 MG tablet       TAKE these medications    Accu-Chek Guide test strip Generic drug: glucose blood USE TO CHECK SUGAR THREE TIMES DAILY AS DIRECTED   Accu-Chek Guide w/Device Kit Use as instructed to check blood sugar.   acetaminophen  325 MG tablet Commonly known as: TYLENOL  Take 2 tablets (650 mg total) by mouth every 6 (six) hours. Replaces: TYLENOL  8 HOUR PO   BD Pen Needle Nano 2nd Gen 32G X 4 MM Misc Generic drug: Insulin  Pen Needle USE 1 PEN NEEDLE WITH INSULIN  PEN once daily   enoxaparin  30 MG/0.3ML injection Commonly known as: LOVENOX  Inject 0.3 mLs (30 mg total) into the skin daily.   ezetimibe  10 MG tablet Commonly known as: ZETIA  Take 1 tablet (10 mg total) by mouth daily.   FreeStyle Libre 3 Plus Sensor Misc Change sensor every 15 days.   FreeStyle Libre 3 Sensor Misc USE ONE SENSOR EVERY 14 DAYS TO CONTINOUSLY MONITOR GLUCOSE   FreeStyle Libre 3 Reader Devi Use to check blood sugar continuously (broken reader, try for lost/damaged med override). E11.65, Z79.4   HYDROcodone -acetaminophen  5-325 MG tablet Commonly known as: NORCO/VICODIN Take 1 tablet by mouth every 6 (six) hours as needed for severe pain (pain score 7-10).   Lantus  SoloStar 100 UNIT/ML Solostar Pen Generic drug: insulin  glargine Inject 20 Units into the skin  daily. What changed: how much to take   linagliptin  5 MG Tabs tablet Commonly known as: Tradjenta  Take 1 tablet (5 mg total) by mouth daily. for diabetes.   metoprolol  succinate 25 MG 24 hr tablet Commonly known as: TOPROL -XL Take 1 tablet (25 mg total) by mouth daily. Take with or immediately following a meal. What changed:  medication strength how much to take   multivitamin capsule Take 1 capsule by mouth daily.   pantoprazole  40 MG tablet Commonly known as: PROTONIX  Take 1 tablet (40 mg total) by mouth daily.   rosuvastatin  10 MG tablet Commonly known as: CRESTOR  Take 1 tablet (10 mg total) by mouth daily. What changed: additional instructions   sertraline  25 MG tablet Commonly known as: ZOLOFT  Take 1 tablet (25 mg total) by mouth daily. for anxiety and depression.   SYSTANE OP Apply 2 drops to eye 2 (two) times daily.   Travatan Z 0.004 % Soln ophthalmic solution Generic drug: Travoprost (BAK Free) Place 1 drop into both eyes at bedtime.   Vitamin D  50 MCG (2000 UT) Caps Take 1 capsule (  2,000 Units total) by mouth daily.               Durable Medical Equipment  (From admission, onward)           Start     Ordered   01/03/24 0921  For home use only DME lightweight manual wheelchair with seat cushion  Once       Comments: Patient suffers from hip fracture which impairs their ability to perform daily activities like toileting in the home.  A walker will not resolve  issue with performing activities of daily living. A wheelchair will allow patient to safely perform daily activities. Patient is not able to propel themselves in the home using a standard weight wheelchair due to general weakness. Patient can self propel in the lightweight wheelchair. Length of need 6 months . Accessories: elevating leg rests (ELRs), wheel locks, extensions and anti-tippers.   01/03/24 0920   01/02/24 1118  For home use only DME Bedside commode  Once       Question:  Patient  needs a bedside commode to treat with the following condition  Answer:  Hip fracture (HCC)   01/02/24 1117   01/02/24 1118  For home use only DME Walker rolling  Once       Question Answer Comment  Walker: With 5 Inch Wheels   Patient needs a walker to treat with the following condition Hip fracture (HCC)      01/02/24 1117   01/02/24 1117  For home use only DME Hospital bed  Once       Question Answer Comment  Length of Need 6 Months   The above medical condition requires: Patient requires the ability to reposition immediately   Head must be elevated greater than: 30 degrees   Bed type Semi-electric   Support Surface: Gel Overlay      01/02/24 1117            Contact information for follow-up providers     Charlene Debby BROCKS, PA-C Follow up on 01/17/2024.   Specialties: Orthopedic Surgery, Emergency Medicine Why: at 130 Contact information: [address] [place] [address] [phone]         Gretta Comer POUR, NP Follow up on 01/13/2024.   Specialty: Internal Medicine Why: at 1115 Contact information: [address] [place] [address] [phone]              Contact information for after-discharge care     Home Medical Care     [facility]) .   Service: Home Health Services Contact information: [address] [place]  [address] [phone]                    Discharge Exam: Fredricka Weights   01/04/24 0500 01/05/24 0500 01/06/24 0500  Weight: 58.2 kg 52.5 kg 57.3 kg   Physical Exam HENT:     Head: Normocephalic.     Mouth/Throat:     Pharynx: No oropharyngeal exudate.  Eyes:     General: Lids are normal.  Cardiovascular:     Rate and Rhythm: Normal rate and regular rhythm.     Heart sounds: Normal heart sounds, S1 normal and S2 normal.  Pulmonary:     Breath sounds: Examination of the right-lower field reveals decreased breath sounds. Examination of the  left-lower field reveals decreased breath sounds. Decreased breath sounds present. No wheezing, rhonchi or rales.  Abdominal:  Palpations: Abdomen is soft.     Tenderness: There is no abdominal tenderness.  Musculoskeletal:     Right lower leg: No swelling.     Left lower leg: No swelling.     Comments: Unable to straight leg raise.  Skin:    General: Skin is warm.     Comments: Bruising inner right thigh.  Neurological:     Mental Status: She is alert.      Condition at discharge: stable  The results of significant diagnostics from this hospitalization (including imaging, microbiology, ancillary and laboratory) are listed below for reference.   Imaging Studies: CT CHEST WO CONTRAST Result Date: 01/03/2024 CLINICAL DATA:  Evaluate right basilar nodular density EXAM: CT CHEST WITHOUT CONTRAST TECHNIQUE: Multidetector CT imaging of the chest was performed following the standard protocol without IV contrast. RADIATION DOSE REDUCTION: This exam was performed according to the departmental dose-optimization program which includes automated exposure control, adjustment of the mA and/or kV according to patient size and/or use of iterative reconstruction technique. COMPARISON:  Chest x-ray from the previous day. FINDINGS: Cardiovascular: Somewhat limited due to lack of IV contrast. Atherosclerotic calcifications of the aorta and its branches are seen. Heavy coronary calcifications are noted. Changes of prior coronary bypass grafting are noted. No cardiac enlargement is seen. Pulmonary artery is not significantly enlarged. Mediastinum/Nodes: The left lobe of the thyroid  is prominent extending into the superior mediastinum. No hilar or mediastinal adenopathy is noted. The esophagus as visualized is within normal limits. Lungs/Pleura: Lungs are well aerated bilaterally. No focal infiltrate or sizable effusion is seen. Tiny Peri fissural nodule is seen on the right measuring 2-3 mm best seen on image  number 104 of series 4. No other nodules are noted. Upper Abdomen: Visualized upper abdomen is within normal limits. Musculoskeletal: Degenerative changes of the thoracic spine are noted. IMPRESSION: Previously seen right basilar nodule is shown to represent a nipple shadow. 2-3 mm right middle lobe nodule is noted adjacent to the major fissure. No follow-up needed if patient is low-risk. Non-contrast chest CT can be considered in 12 months if patient is high-risk. This recommendation follows the consensus statement: Guidelines for Management of Incidental Pulmonary Nodules Detected on CT Images: From the Fleischner Society 2017; Radiology 2017; 284:228-243. Aortic Atherosclerosis (ICD10-I70.0). Electronically Signed   By: Oneil Devonshire M.D.   On: 01/03/2024 21:14   DG Chest Port 1 View Result Date: 01/02/2024 CLINICAL DATA:  Fever. EXAM: PORTABLE CHEST 1 VIEW COMPARISON:  Chest radiograph dated 12/31/2023. FINDINGS: No focal consolidation, pleural effusion or pneumothorax. A 17 mm nodular density noted over the right lower lung field. CT may provide better evaluation. The cardiac silhouette is within limits. Median sternotomy wires and CABG vascular clips. No acute osseous pathology. IMPRESSION: 1. No acute cardiopulmonary process. 2. A 17 mm nodular density over the right lower lung field. CT may provide better evaluation. Electronically Signed   By: Vanetta Chou M.D.   On: 01/02/2024 20:38   DG HIP UNILAT W OR W/O PELVIS 2-3 VIEWS RIGHT Result Date: 01/01/2024 CLINICAL DATA:  Right hip fracture. EXAM: DG HIP (WITH OR WITHOUT PELVIS) 2-3V RIGHT COMPARISON:  None Available. FINDINGS: Seven intraoperative fluoroscopic spot images provided. The total fluoroscopic time is 1 minute 51 seconds with a cumulative air kerma of 14.42 mGy. ORIF of the right femur. IMPRESSION: Intraoperative fluoroscopic spot images of the right femur. Electronically Signed   By: Vanetta Chou M.D.   On: 01/01/2024 15:23   DG  C-Arm 1-60  Min-No Report Result Date: 01/01/2024 Fluoroscopy was utilized by the requesting physician.  No radiographic interpretation.   CT Knee Right Wo Contrast Result Date: 12/31/2023 CLINICAL DATA:  Status post fall, right knee pain EXAM: CT OF THE RIGHT KNEE WITHOUT CONTRAST TECHNIQUE: Multidetector CT imaging of the right knee was performed according to the standard protocol. Multiplanar CT image reconstructions were also generated. RADIATION DOSE REDUCTION: This exam was performed according to the departmental dose-optimization program which includes automated exposure control, adjustment of the mA and/or kV according to patient size and/or use of iterative reconstruction technique. COMPARISON:  Right knee x-ray 12/31/2023 FINDINGS: Bones/Joint/Cartilage Generalized osteopenia. No fracture or dislocation. Normal alignment. No joint effusion. Severe osteoarthritis of the weight-bearing lateral femorotibial compartment with subchondral cystic changes and subchondral sclerosis. Mild osteoarthritis of the weight-bearing medial femorotibial compartment. Patellofemoral joint space is maintained. No aggressive osseous lesion. Ligaments Ligaments are suboptimally evaluated by CT. Muscles and Tendons Muscles are normal. No muscle atrophy. No intramuscular fluid collection or hematoma. Moderate atrophy of the medial gastrocnemius muscle. Soft tissue No fluid collection or hematoma. No soft tissue mass. Peripheral vascular atherosclerotic disease. IMPRESSION: 1. No acute osseous injury of the right knee. 2. Severe osteoarthritis of the weight-bearing lateral femorotibial compartment. Mild osteoarthritis of the weight-bearing medial femorotibial compartment. Electronically Signed   By: Julaine Blanch M.D.   On: 12/31/2023 15:35   CT Head Wo Contrast Result Date: 12/31/2023 CLINICAL DATA:  Fall landing on right hip while getting out of car. Also injuring right side of head. EXAM: CT HEAD WITHOUT CONTRAST CT  CERVICAL SPINE WITHOUT CONTRAST TECHNIQUE: Multidetector CT imaging of the head and cervical spine was performed following the standard protocol without intravenous contrast. Multiplanar CT image reconstructions of the cervical spine were also generated. RADIATION DOSE REDUCTION: This exam was performed according to the departmental dose-optimization program which includes automated exposure control, adjustment of the mA and/or kV according to patient size and/or use of iterative reconstruction technique. COMPARISON:  None Available. FINDINGS: CT HEAD FINDINGS Brain: Ventricles, cisterns and other CSF spaces are normal. No mass, mass effect, shift of midline structures or acute hemorrhage. No acute infarction. Vascular: No hyperdense vessel or unexpected calcification. Skull: Normal. Negative for fracture or focal lesion. Sinuses/Orbits: No acute finding. Other: None. CT CERVICAL SPINE FINDINGS Alignment: No posttraumatic subluxation. Skull base and vertebrae: Mild to moderate spondylosis throughout the cervical spine to include uncovertebral joint spurring and facet arthropathy. No compression fracture. No other acute fracture. Mild right-sided neural foraminal narrowing at the C3-4 level. Mild left-sided neural foraminal narrowing at the C4-5 level. Soft tissues and spinal canal: Prevertebral soft tissues are normal. No significant spinal canal stenosis. Disc levels: Disc space narrowing at the C4-5 level and C6-7 levels. Upper chest: No acute findings. Other: None. IMPRESSION: 1. No acute brain injury. 2. No acute cervical spine injury. 3. Mild to moderate spondylosis throughout the cervical spine with disc disease at the C4-5 and C6-7 levels. Minimal right-sided neural foraminal narrowing at the C3-4 level and left-sided neural from narrowing at the C4-5 level. Electronically Signed   By: Toribio Agreste M.D.   On: 12/31/2023 14:48   CT Cervical Spine Wo Contrast Result Date: 12/31/2023 CLINICAL DATA:  Fall  landing on right hip while getting out of car. Also injuring right side of head. EXAM: CT HEAD WITHOUT CONTRAST CT CERVICAL SPINE WITHOUT CONTRAST TECHNIQUE: Multidetector CT imaging of the head and cervical spine was performed following the standard protocol without intravenous contrast. Multiplanar CT  image reconstructions of the cervical spine were also generated. RADIATION DOSE REDUCTION: This exam was performed according to the departmental dose-optimization program which includes automated exposure control, adjustment of the mA and/or kV according to patient size and/or use of iterative reconstruction technique. COMPARISON:  None Available. FINDINGS: CT HEAD FINDINGS Brain: Ventricles, cisterns and other CSF spaces are normal. No mass, mass effect, shift of midline structures or acute hemorrhage. No acute infarction. Vascular: No hyperdense vessel or unexpected calcification. Skull: Normal. Negative for fracture or focal lesion. Sinuses/Orbits: No acute finding. Other: None. CT CERVICAL SPINE FINDINGS Alignment: No posttraumatic subluxation. Skull base and vertebrae: Mild to moderate spondylosis throughout the cervical spine to include uncovertebral joint spurring and facet arthropathy. No compression fracture. No other acute fracture. Mild right-sided neural foraminal narrowing at the C3-4 level. Mild left-sided neural foraminal narrowing at the C4-5 level. Soft tissues and spinal canal: Prevertebral soft tissues are normal. No significant spinal canal stenosis. Disc levels: Disc space narrowing at the C4-5 level and C6-7 levels. Upper chest: No acute findings. Other: None. IMPRESSION: 1. No acute brain injury. 2. No acute cervical spine injury. 3. Mild to moderate spondylosis throughout the cervical spine with disc disease at the C4-5 and C6-7 levels. Minimal right-sided neural foraminal narrowing at the C3-4 level and left-sided neural from narrowing at the C4-5 level. Electronically Signed   By: Toribio Agreste M.D.   On: 12/31/2023 14:48   DG Chest Portable 1 View Result Date: 12/31/2023 CLINICAL DATA:  Preop EXAM: PORTABLE CHEST 1 VIEW COMPARISON:  Jun 12, 2007 FINDINGS: The heart size and mediastinal contours are within normal limits. Status post coronary artery bypass graft. Both lungs are clear. The visualized skeletal structures are unremarkable. IMPRESSION: No active disease. Electronically Signed   By: Lynwood Landy Raddle M.D.   On: 12/31/2023 14:36   DG Knee Complete 4 Views Right Result Date: 12/31/2023 CLINICAL DATA:  fall, landing on right hip with noted shortening on the physical exam EXAM: RIGHT KNEE - COMPLETE 4+ VIEW COMPARISON:  None Available. FINDINGS: Diffuse osteopenia.Subtle lucency in the medial aspect of the lateral tibial plateau with subchondral sclerosis of the lateral tibial plateau surface no joint effusion. Severe joint space loss of the lateral compartment. Mild medial compartment and patellofemoral joint space loss. Peripheral vascular atherosclerosis. IMPRESSION: Diffuse osteopenia. Subtle lucency in the medial aspect of the lateral tibial plateau with adjacent bony sclerosis of the articular surface. In the absence of a significant joint effusion or lipohemarthrosis, the lucency may be artifactual and the bony sclerotic changes may be related to advanced osteoarthritis. If there is further concern for underlying fracture, a CT of the knee should be considered. Electronically Signed   By: Rogelia Myers M.D.   On: 12/31/2023 14:23   DG Hip Unilat W or Wo Pelvis 2-3 Views Right Result Date: 12/31/2023 EXAM: 2 or 3 VIEW(S) XRAY OF THE RIGHT HIP 12/31/2023 02:06:00 PM COMPARISON: None available. CLINICAL HISTORY: fall FINDINGS: BONES AND JOINTS: Comminuted intertrochanteric fractures of the proximal right femur with varus angulation. Probable nondisplaced fractures of the left superior pubic ramus. Degenerative changes in the left hip. No dislocation at the hip joint. Si joints  and symphysis pubis are not displaced. LUMBAR SPINE: Degenerative changes in the lower lumbar spine. SOFT TISSUES: Prominent calcifications in the pelvis likely representing calcified fibroids. IMPRESSION: 1. Comminuted intertrochanteric fracture of the proximal RIGHT femur with varus angulation. No hip dislocation. 2. Probable nondisplaced fracture of the LEFT superior pubic ramus. Electronically  signed by: Elsie Gravely MD 12/31/2023 02:15 PM EST RP Workstation: [id number]   LONG TERM MONITOR (3-14 DAYS) Result Date: 12/16/2023   The patient was monitored for 13 days, 23 hours.   The predominant rhythm was sinus with an average rate of 68 bpm (range 46-108 bpm in sinus).   There were frequent PACs (17% burden) and rare PVCs.   1,242 episodes of paroxysmal supraventricular tachycardia occurred, lasting up to 56 seconds with a maximum rate of 203 bpm.   No prolonged pause was observed.   There were no patient triggered events. Predominantly sinus rhythm with frequent PACs and numerous episodes of PSVT lasting up to 56 seconds, as detailed above.    Microbiology: Results for orders placed or performed during the hospital encounter of 12/31/23  MRSA Next Gen by PCR, Nasal     Status: None   Collection Time: 01/01/24  4:40 AM   Specimen: Nasal Mucosa; Nasal Swab  Result Value Ref Range Status   MRSA by PCR Next Gen NOT DETECTED NOT DETECTED Final    Comment: (NOTE) The GeneXpert MRSA Assay (FDA approved for NASAL specimens only), is one component of a comprehensive MRSA colonization surveillance program. It is not intended to diagnose MRSA infection nor to guide or monitor treatment for MRSA infections. Test performance is not FDA approved in patients less than 80 years old. Performed at [facility], [address] [place], [place] [address]   C Difficile Quick Screen w PCR reflex     Status: None   Collection Time: 01/02/24  4:28 PM   Specimen: STOOL  Result Value Ref Range  Status   C Diff antigen NEGATIVE NEGATIVE Final   C Diff toxin NEGATIVE NEGATIVE Final   C Diff interpretation No C. difficile detected.  Final    Comment: Performed at [facility], [address] [place], [place] [address]  Gastrointestinal Panel by PCR , Stool     Status: None   Collection Time: 01/02/24  4:29 PM   Specimen: STOOL  Result Value Ref Range Status   Campylobacter species NOT DETECTED NOT DETECTED Final   Plesimonas shigelloides NOT DETECTED NOT DETECTED Final   Salmonella species NOT DETECTED NOT DETECTED Final   Yersinia enterocolitica NOT DETECTED NOT DETECTED Final   Vibrio species NOT DETECTED NOT DETECTED Final   Vibrio cholerae NOT DETECTED NOT DETECTED Final   Enteroaggregative E coli (EAEC) NOT DETECTED NOT DETECTED Final   Enteropathogenic E coli (EPEC) NOT DETECTED NOT DETECTED Final   Enterotoxigenic E coli (ETEC) NOT DETECTED NOT DETECTED Final   Shiga like toxin producing E coli (STEC) NOT DETECTED NOT DETECTED Final   Shigella/Enteroinvasive E coli (EIEC) NOT DETECTED NOT DETECTED Final   Cryptosporidium NOT DETECTED NOT DETECTED Final   Cyclospora cayetanensis NOT DETECTED NOT DETECTED Final   Entamoeba histolytica NOT DETECTED NOT DETECTED Final   Giardia lamblia NOT DETECTED NOT DETECTED Final   Adenovirus F40/41 NOT DETECTED NOT DETECTED Final   Astrovirus NOT DETECTED NOT DETECTED Final   Norovirus GI/GII NOT DETECTED NOT DETECTED Final   Rotavirus A NOT DETECTED NOT DETECTED Final   Sapovirus (I, II, IV, and V) NOT DETECTED NOT DETECTED Final    Comment: Performed at [facility], [address] [place], [place] [address]  Culture, blood (Routine X 2) w Reflex to ID Panel     Status: None (Preliminary result)   Collection Time: 01/02/24  6:53 PM   Specimen: BLOOD  Result Value  Ref Range Status   Specimen Description BLOOD BLOOD RIGHT ARM  Final   Special Requests   Final    BOTTLES DRAWN AEROBIC AND ANAEROBIC Blood Culture  adequate volume   Culture   Final    NO GROWTH 4 DAYS Performed at [facility], [address] [place], [place] [address]    Report Status PENDING  Incomplete  Culture, blood (Routine X 2) w Reflex to ID Panel     Status: None (Preliminary result)   Collection Time: 01/02/24  6:53 PM   Specimen: BLOOD  Result Value Ref Range Status   Specimen Description BLOOD BLOOD LEFT ARM  Final   Special Requests   Final    BOTTLES DRAWN AEROBIC AND ANAEROBIC Blood Culture adequate volume   Culture   Final    NO GROWTH 4 DAYS Performed at [facility], [address] [place], [place] [address]    Report Status PENDING  Incomplete    Labs: CBC: Recent Labs  Lab 12/31/23 1515 01/01/24 0913 01/02/24 0757 01/03/24 0534 01/04/24 0959 01/04/24 1352 01/05/24 0443 01/05/24 1416 01/06/24 0615  WBC 7.0 6.7 8.4 7.1  --   --  4.6  --  3.9*  NEUTROABS 5.1  --   --   --   --   --   --   --   --   HGB 11.2* 9.8* 7.7* 9.0* 8.1* 8.1* 7.3* 9.9* 9.6*  HCT 32.4* 28.8* 23.2* 26.6*  --   --  22.0*  --  28.6*  MCV 90.8 91.4 93.5 91.4  --   --  94.4  --  90.5  PLT 146* 116* 88* 88*  --   --  102*  --  128*   Basic Metabolic Panel: Recent Labs  Lab 01/02/24 0757 01/03/24 0534 01/04/24 0959 01/05/24 0443 01/06/24 0615  NA 136 139 137 140 140  K 5.0 4.2 4.3 4.5 4.4  CL 105 107 106 109 109  CO2 20* 22 22 22 22   GLUCOSE 229* 216* 237* 155* 160*  BUN 42* 44* 49* 48* 33*  CREATININE 1.99* 1.68* 1.61* 1.41* 1.08*  CALCIUM  8.2* 8.6* 8.5* 8.3* 8.7*  MG 2.0  --   --   --   --   PHOS 3.7  --   --   --   --    Liver Function Tests: No results for input(s): AST, ALT, ALKPHOS, BILITOT, PROT, ALBUMIN in the last 168 hours. CBG: Recent Labs  Lab 01/05/24 1142 01/05/24 1646 01/05/24 1942 01/06/24 0820 01/06/24 1116  GLUCAP 180* 163* 143* 176* 134*    Discharge time spent: greater than 30 minutes.  Signed: Charlie Patterson, MD [facility] 01/06/2024 "

## 2024-01-07 ENCOUNTER — Telehealth: Payer: Self-pay

## 2024-01-07 LAB — PREPARE RBC (CROSSMATCH)

## 2024-01-07 LAB — CULTURE, BLOOD (ROUTINE X 2)
Culture: NO GROWTH
Culture: NO GROWTH
Special Requests: ADEQUATE
Special Requests: ADEQUATE

## 2024-01-07 NOTE — Telephone Encounter (Signed)
 Left message to return call to office. Need to follow up regarding FMLA/disability question.

## 2024-01-07 NOTE — Telephone Encounter (Signed)
 Copied from CRM [id number]. Topic: Clinical - Medical Advice >> Jan 07, 2024  9:00 AM Anairis L wrote: Reason for CRM: Daughter teresa Slatten calling in regarding her extension of  FMLA dates.   Requesting a call back [phone]  Routed in error to [facility] clinical pool

## 2024-01-07 NOTE — Transitions of Care (Post Inpatient/ED Visit) (Signed)
" ° °  01/07/2024  Name: Laura Ferguson MRN: [id number] DOB: 12-27-1932  Today's TOC FU Call Status: Today's TOC FU Call Status:: Unsuccessful Call (1st Attempt) Unsuccessful Call (1st Attempt) Date: 01/07/24  Attempted to reach the patient regarding the most recent Inpatient/ED visit.  Follow Up Plan: Additional outreach attempts will be made to reach the patient to complete the Transitions of Care (Post Inpatient/ED visit) call.   Arvin Seip RN, BSN, CCM [employer], Population Health Case Manager Phone: [phone]  "

## 2024-01-07 NOTE — Telephone Encounter (Signed)
 Yes, that is fine. Can we get some additional information?

## 2024-01-10 ENCOUNTER — Other Ambulatory Visit: Payer: Self-pay

## 2024-01-10 NOTE — Telephone Encounter (Signed)
 Spoke with patient's daughter, Verneita, she is asking to update FMLA from 3 days a week to 5, keeping the 4hour duration as her mother is needing home care at this time.  Will fax updated forms to [employer] at [phone] when updated.  Forms placed in providers box for review.

## 2024-01-13 ENCOUNTER — Ambulatory Visit (INDEPENDENT_AMBULATORY_CARE_PROVIDER_SITE_OTHER): Admitting: Family Medicine

## 2024-01-13 ENCOUNTER — Encounter: Payer: Self-pay | Admitting: Family Medicine

## 2024-01-13 VITALS — BP 100/68 | HR 109 | Temp 98.0°F | Ht 62.0 in | Wt 126.0 lb

## 2024-01-13 DIAGNOSIS — R4182 Altered mental status, unspecified: Secondary | ICD-10-CM

## 2024-01-13 DIAGNOSIS — E1165 Type 2 diabetes mellitus with hyperglycemia: Secondary | ICD-10-CM | POA: Diagnosis not present

## 2024-01-13 DIAGNOSIS — I10 Essential (primary) hypertension: Secondary | ICD-10-CM | POA: Diagnosis not present

## 2024-01-13 DIAGNOSIS — S72001D Fracture of unspecified part of neck of right femur, subsequent encounter for closed fracture with routine healing: Secondary | ICD-10-CM | POA: Diagnosis not present

## 2024-01-13 DIAGNOSIS — K5903 Drug induced constipation: Secondary | ICD-10-CM

## 2024-01-13 DIAGNOSIS — S32512D Fracture of superior rim of left pubis, subsequent encounter for fracture with routine healing: Secondary | ICD-10-CM | POA: Diagnosis not present

## 2024-01-13 DIAGNOSIS — D62 Acute posthemorrhagic anemia: Secondary | ICD-10-CM | POA: Diagnosis not present

## 2024-01-13 DIAGNOSIS — Z794 Long term (current) use of insulin: Secondary | ICD-10-CM | POA: Diagnosis not present

## 2024-01-13 LAB — CBC WITH DIFFERENTIAL/PLATELET
Basophils Absolute: 0 K/uL (ref 0.0–0.1)
Basophils Relative: 0.4 % (ref 0.0–3.0)
Eosinophils Absolute: 0 K/uL (ref 0.0–0.7)
Eosinophils Relative: 0.5 % (ref 0.0–5.0)
HCT: 33.1 % — ABNORMAL LOW (ref 36.0–46.0)
Hemoglobin: 11.1 g/dL — ABNORMAL LOW (ref 12.0–15.0)
Lymphocytes Relative: 16.4 % (ref 12.0–46.0)
Lymphs Abs: 1.1 K/uL (ref 0.7–4.0)
MCHC: 33.6 g/dL (ref 30.0–36.0)
MCV: 91.2 fl (ref 78.0–100.0)
Monocytes Absolute: 0.6 K/uL (ref 0.1–1.0)
Monocytes Relative: 9.5 % (ref 3.0–12.0)
Neutro Abs: 4.8 K/uL (ref 1.4–7.7)
Neutrophils Relative %: 73.2 % (ref 43.0–77.0)
Platelets: 327 K/uL (ref 150.0–400.0)
RBC: 3.63 Mil/uL — ABNORMAL LOW (ref 3.87–5.11)
RDW: 14.5 % (ref 11.5–15.5)
WBC: 6.6 K/uL (ref 4.0–10.5)

## 2024-01-13 LAB — POCT URINALYSIS DIPSTICK
Bilirubin, UA: NEGATIVE
Blood, UA: NEGATIVE
Glucose, UA: NEGATIVE
Ketones, UA: NEGATIVE
Leukocytes, UA: NEGATIVE
Nitrite, UA: NEGATIVE
Protein, UA: NEGATIVE
Spec Grav, UA: 1.01
Urobilinogen, UA: 0.2 U/dL
pH, UA: 6

## 2024-01-13 LAB — COMPREHENSIVE METABOLIC PANEL WITH GFR
ALT: 21 U/L (ref 3–35)
AST: 22 U/L (ref 5–37)
Albumin: 3.7 g/dL (ref 3.5–5.2)
Alkaline Phosphatase: 78 U/L (ref 39–117)
BUN: 29 mg/dL — ABNORMAL HIGH (ref 6–23)
CO2: 25 meq/L (ref 19–32)
Calcium: 9.5 mg/dL (ref 8.4–10.5)
Chloride: 102 meq/L (ref 96–112)
Creatinine, Ser: 1.36 mg/dL — ABNORMAL HIGH (ref 0.40–1.20)
GFR: 34.52 mL/min — ABNORMAL LOW
Glucose, Bld: 226 mg/dL — ABNORMAL HIGH (ref 70–99)
Potassium: 4.5 meq/L (ref 3.5–5.1)
Sodium: 136 meq/L (ref 135–145)
Total Bilirubin: 1 mg/dL (ref 0.2–1.2)
Total Protein: 7.2 g/dL (ref 6.0–8.3)

## 2024-01-13 NOTE — Patient Instructions (Addendum)
 Labs today  Urine test today.  Schedule tylenol  three to four times a day, may try 1/2 hydrocodone  at a time to help pain control.  Nice to see you today. I hope you have a speedy recovery.

## 2024-01-13 NOTE — Progress Notes (Unsigned)
 " Ph: [phone] Fax: [phone]   Patient ID: Laura Ferguson, female    DOB: Mar 21, 1932, 89 y.o.   MRN: [id number]  This visit was conducted in person.  BP 100/68 (BP Location: Right Arm, Patient Position: Sitting, Cuff Size: Small)   Pulse (!) 109   Temp 98 F (36.7 C) (Oral)   Ht 5' 2 (1.575 m)   Wt 126 lb (57.2 kg)   SpO2 98%   BMI 23.05 kg/m   BP Readings from Last 3 Encounters:  01/13/24 100/68  01/06/24 (!) 112/57  11/28/23 138/64   CC: hosp f/u visit  Subjective:   HPI: Laura Ferguson is a 89 y.o. female presenting on 01/13/2024 for Hospitalization Follow-up (Pt was seen in hsp 12/16 with Closed fracture of Righter hip and had postoperative anemia//Pt hasn't followed up with Ortho yet, appt set for 01/17/2023//Pt reports unbearable pain, came in via wheelchair today/Pt had 1st Physical therapy visit last week, will have home visits 2x week//Pt's daughter Angeline and Zebedee in room with Pt./They stated they had to call 911 on P today for altered mental status/)   Patient of Mallie Foerster here for hospital follow up visit. H/o dementia followed by [facility] neurology Dr Maree. H/o stroke 2021 with residual imbalance.   Recent hospitalization for fall with resultant R hip fracture s/p R hip intramedullary nail by Dr Lorelle. Did develop post-op anemia due to acute blood loss, s/p 2 units of blood transfused. Discharge Hgb 9.6. Cr peaked to 2.2, improved with IVF. Did develop post-op fever - CXR normal, blood cultures negative in hospital.  Hospital records reviewed. Med rec performed.  Known diabetic on lantus  20u daily and metformin .   Ongoing significant pain to right leg after surgery. This is despite pain regimen of tylenol  650mg  QID with hydrocodone /apap 5/325mg  at bedtime.  Ongoing   Daughters called EMS this morning due to disorientation/AMS/confusion - 12 lead strip with sinus arrhythmia.  Denies fevers, cough, diarrhea, UTI symptoms.  + constipation.   She did not  take lovenox  shots.  She has been choosing which medicines she takes due to confusion over different dosages.   Discharged with home health: [facility] planned twice weekly.  Other follow up appointments scheduled: PCP 01/17/2024, ortho Dr Mardee 01/17/2024.  ______________________________________________________________________ Hospital admission: 12/31/2023 Hospital discharge: 01/06/2024 TCM f/u phone call: attempted, not completed   Recommendations at discharge:  Follow-up PCP 5 days Follow-up orthopedic surgery   Discharge Diagnoses: Principal Problem:   Postoperative anemia due to acute blood loss Active Problems:   Closed fracture of right hip (HCC)   Orthostatic hypotension   Acute kidney injury superimposed on CKD   Essential hypertension   Uncontrolled type 2 diabetes mellitus with hyperglycemia, with long-term current use of insulin  (HCC)   Hyperlipidemia, unspecified   Shortness of breath   Low grade fever   Pelvic fracture (HCC) Resolved Problems:   * No resolved hospital problems. *     Relevant past medical, surgical, family and social history reviewed and updated as indicated. Interim medical history since our last visit reviewed. Allergies and medications reviewed and updated. Outpatient Medications Prior to Visit  Medication Sig Dispense Refill   acetaminophen  (TYLENOL ) 325 MG tablet Take 2 tablets (650 mg total) by mouth every 6 (six) hours.     Blood Glucose Monitoring Suppl (ACCU-CHEK GUIDE) w/Device KIT Use as instructed to check blood sugar. 1 kit 0   Cholecalciferol  (VITAMIN D ) 50 MCG (2000 UT) CAPS Take 1 capsule (  2,000 Units total) by mouth daily at 12 noon. 90 capsule 1   Continuous Glucose Receiver (FREESTYLE LIBRE 3 READER) DEVI Use to check blood sugar continuously (broken reader, try for lost/damaged med override). E11.65, Z79.4 1 each 0   Continuous Glucose Sensor (FREESTYLE LIBRE 3 PLUS SENSOR) MISC Change sensor every 15 days. 6 each 3    Continuous Glucose Sensor (FREESTYLE LIBRE 3 SENSOR) MISC USE ONE SENSOR EVERY 14 DAYS TO CONTINOUSLY MONITOR GLUCOSE 6 each 0   enoxaparin  (LOVENOX ) 30 MG/0.3ML injection Inject 0.3 mLs (30 mg total) into the skin daily. 8.4 mL 0   ezetimibe  (ZETIA ) 10 MG tablet Take 1 tablet (10 mg total) by mouth every evening. 90 tablet 1   glucose blood (ACCU-CHEK GUIDE) test strip USE TO CHECK SUGAR THREE TIMES DAILY AS DIRECTED 250 each 3   HYDROcodone -acetaminophen  (NORCO/VICODIN) 5-325 MG tablet Take 1 tablet by mouth every 6 (six) hours as needed for severe pain (pain score 7-10). 12 tablet 0   Insulin  Pen Needle (BD PEN NEEDLE NANO 2ND GEN) 32G X 4 MM MISC USE 1 PEN NEEDLE WITH INSULIN  PEN once daily 100 each 3   LANTUS  SOLOSTAR 100 UNIT/ML Solostar Pen Inject 20 Units into the skin daily.     linagliptin  (TRADJENTA ) 5 MG TABS tablet Take 1 tablet (5 mg total) by mouth daily at 12 noon. for diabetes. 90 tablet 1   metoprolol  succinate (TOPROL -XL) 25 MG 24 hr tablet Take 1 tablet (25 mg total) by mouth daily. Take with or immediately following a meal. 30 tablet 0   Multiple Vitamin (MULTIVITAMIN) capsule Take 1 capsule by mouth daily.     pantoprazole  (PROTONIX ) 40 MG tablet Take 1 tablet (40 mg total) by mouth in the morning. 90 tablet 3   Polyethyl Glycol-Propyl Glycol (SYSTANE OP) Apply 2 drops to eye 2 (two) times daily.      rosuvastatin  (CRESTOR ) 10 MG tablet Take 1 tablet (10 mg total) by mouth daily at 12 noon. 90 tablet 1   sertraline  (ZOLOFT ) 25 MG tablet Take 1 tablet (25 mg total) by mouth daily at 12 noon. for anxiety and depression. 90 tablet 1   TRAVATAN Z 0.004 % SOLN ophthalmic solution Place 1 drop into both eyes at bedtime.     No facility-administered medications prior to visit.     Per HPI unless specifically indicated in ROS section below Review of Systems  Objective:  BP 100/68 (BP Location: Right Arm, Patient Position: Sitting, Cuff Size: Small)   Pulse (!) 109   Temp 98 F  (36.7 C) (Oral)   Ht 5' 2 (1.575 m)   Wt 126 lb (57.2 kg)   SpO2 98%   BMI 23.05 kg/m   Wt Readings from Last 3 Encounters:  01/13/24 126 lb (57.2 kg)  01/06/24 126 lb 5.2 oz (57.3 kg)  11/28/23 120 lb 8 oz (54.7 kg)      Physical Exam Vitals and nursing note reviewed.  Constitutional:      Appearance: Normal appearance. She is not ill-appearing.     Comments:  Tired and uncomfortable appearing sitting in wheelchair   HENT:     Head: Normocephalic and atraumatic.     Mouth/Throat:     Mouth: Mucous membranes are dry.     Pharynx: Oropharynx is clear. No oropharyngeal exudate or posterior oropharyngeal erythema.  Eyes:     Extraocular Movements: Extraocular movements intact.     Pupils: Pupils are equal, round, and reactive to light.  Neck:     Thyroid : No thyroid  mass or thyromegaly.  Cardiovascular:     Rate and Rhythm: Normal rate.     Pulses: Normal pulses.     Heart sounds: Normal heart sounds. No murmur heard. Pulmonary:     Effort: Pulmonary effort is normal. No respiratory distress.     Breath sounds: Normal breath sounds. No wheezing, rhonchi or rales.  Abdominal:     Palpations: Abdomen is soft.     Tenderness: There is no abdominal tenderness. There is no guarding or rebound.  Musculoskeletal:        General: Swelling (mild medial knee swelling present) and tenderness present.     Cervical back: Normal range of motion and neck supple.     Right lower leg: No edema.     Left lower leg: No edema.  Skin:    General: Skin is warm and dry.     Findings: Bruising (distal medial right thigh past knee, fading) present. No rash.     Comments:  R lateral thigh dressings clean/dry/intact  Neurological:     Mental Status: She is alert.  Psychiatric:        Mood and Affect: Mood normal.        Behavior: Behavior normal.       Results for orders placed or performed during the hospital encounter of 12/31/23  CBC with Differential   Collection Time: 12/31/23   3:15 PM  Result Value Ref Range   WBC 7.0 4.0 - 10.5 K/uL   RBC 3.57 (L) 3.87 - 5.11 MIL/uL   Hemoglobin 11.2 (L) 12.0 - 15.0 g/dL   HCT 67.5 (L) 63.9 - 53.9 %   MCV 90.8 80.0 - 100.0 fL   MCH 31.4 26.0 - 34.0 pg   MCHC 34.6 30.0 - 36.0 g/dL   RDW 87.2 88.4 - 84.4 %   Platelets 146 (L) 150 - 400 K/uL   nRBC 0.0 0.0 - 0.2 %   Neutrophils Relative % 73 %   Neutro Abs 5.1 1.7 - 7.7 K/uL   Lymphocytes Relative 18 %   Lymphs Abs 1.3 0.7 - 4.0 K/uL   Monocytes Relative 8 %   Monocytes Absolute 0.6 0.1 - 1.0 K/uL   Eosinophils Relative 1 %   Eosinophils Absolute 0.1 0.0 - 0.5 K/uL   Basophils Relative 0 %   Basophils Absolute 0.0 0.0 - 0.1 K/uL   Immature Granulocytes 0 %   Abs Immature Granulocytes 0.02 0.00 - 0.07 K/uL  Basic metabolic panel   Collection Time: 12/31/23  3:15 PM  Result Value Ref Range   Sodium 139 135 - 145 mmol/L   Potassium 4.3 3.5 - 5.1 mmol/L   Chloride 104 98 - 111 mmol/L   CO2 25 22 - 32 mmol/L   Glucose, Bld 147 (H) 70 - 99 mg/dL   BUN 20 8 - 23 mg/dL   Creatinine, Ser 8.79 (H) 0.44 - 1.00 mg/dL   Calcium  9.5 8.9 - 10.3 mg/dL   GFR, Estimated 43 (L) >60 mL/min   Anion gap 10 5 - 15  ABO/Rh   Collection Time: 12/31/23  3:15 PM  Result Value Ref Range   ABO/RH(D)      O POS Performed at [facility], [address] [place], [place] [address]   Glucose, capillary   Collection Time: 12/31/23  5:02 PM  Result Value Ref Range   Glucose-Capillary 126 (H) 70 - 99 mg/dL  Glucose, capillary   Collection Time:  12/31/23  8:05 PM  Result Value Ref Range   Glucose-Capillary 163 (H) 70 - 99 mg/dL  MRSA Next Gen by PCR, Nasal   Collection Time: 01/01/24  4:40 AM   Specimen: Nasal Mucosa; Nasal Swab  Result Value Ref Range   MRSA by PCR Next Gen NOT DETECTED NOT DETECTED  Urinalysis, Complete w Microscopic -Urine, Catheterized   Collection Time: 01/01/24  7:06 AM  Result Value Ref Range   Color, Urine YELLOW (A) YELLOW   APPearance HAZY (A)  CLEAR   Specific Gravity, Urine 1.015 1.005 - 1.030   pH 5.0 5.0 - 8.0   Glucose, UA >=500 (A) NEGATIVE mg/dL   Hgb urine dipstick NEGATIVE NEGATIVE   Bilirubin Urine NEGATIVE NEGATIVE   Ketones, ur NEGATIVE NEGATIVE mg/dL   Protein, ur 30 (A) NEGATIVE mg/dL   Nitrite NEGATIVE NEGATIVE   Leukocytes,Ua NEGATIVE NEGATIVE   RBC / HPF 6-10 0 - 5 RBC/hpf   WBC, UA 6-10 0 - 5 WBC/hpf   Bacteria, UA RARE (A) NONE SEEN   Squamous Epithelial / HPF 11-20 0 - 5 /HPF   Mucus PRESENT    Hyaline Casts, UA PRESENT   Glucose, capillary   Collection Time: 01/01/24  8:07 AM  Result Value Ref Range   Glucose-Capillary 105 (H) 70 - 99 mg/dL  Basic metabolic panel   Collection Time: 01/01/24  9:13 AM  Result Value Ref Range   Sodium 138 135 - 145 mmol/L   Potassium 4.7 3.5 - 5.1 mmol/L   Chloride 104 98 - 111 mmol/L   CO2 24 22 - 32 mmol/L   Glucose, Bld 98 70 - 99 mg/dL   BUN 35 (H) 8 - 23 mg/dL   Creatinine, Ser 7.79 (H) 0.44 - 1.00 mg/dL   Calcium  9.4 8.9 - 10.3 mg/dL   GFR, Estimated 21 (L) >60 mL/min   Anion gap 9 5 - 15  CBC   Collection Time: 01/01/24  9:13 AM  Result Value Ref Range   WBC 6.7 4.0 - 10.5 K/uL   RBC 3.15 (L) 3.87 - 5.11 MIL/uL   Hemoglobin 9.8 (L) 12.0 - 15.0 g/dL   HCT 71.1 (L) 63.9 - 53.9 %   MCV 91.4 80.0 - 100.0 fL   MCH 31.1 26.0 - 34.0 pg   MCHC 34.0 30.0 - 36.0 g/dL   RDW 86.8 88.4 - 84.4 %   Platelets 116 (L) 150 - 400 K/uL   nRBC 0.0 0.0 - 0.2 %  VITAMIN D  25 Hydroxy (Vit-D Deficiency, Fractures)   Collection Time: 01/01/24  9:13 AM  Result Value Ref Range   Vit D, 25-Hydroxy 41.1 30 - 100 ng/mL  Iron and TIBC   Collection Time: 01/01/24  9:13 AM  Result Value Ref Range   Iron 20 (L) 28 - 170 ug/dL   TIBC 764 (L) 749 - 549 ug/dL   Saturation Ratios 9 (L) 10.4 - 31.8 %   UIBC 215 ug/dL  Ferritin   Collection Time: 01/01/24  9:13 AM  Result Value Ref Range   Ferritin 265 11 - 307 ng/mL  Glucose, capillary   Collection Time: 01/01/24 10:34 AM   Result Value Ref Range   Glucose-Capillary 78 70 - 99 mg/dL   Comment 1 Notify RN    Comment 2 Document in Chart   Type and screen [facility]   Collection Time: 01/01/24 11:18 AM  Result Value Ref Range   ABO/RH(D) O POS    Antibody Screen  NEG    Sample Expiration 01/04/2024,2359    Unit Number [id number]    Blood Component Type RBC LR PHER1    Unit division 00    Status of Unit ISSUED,FINAL    Transfusion Status OK TO TRANSFUSE    Crossmatch Result      Compatible Performed at [facility], [address] [place], [place] [address]   [facility]   Collection Time: 01/01/24 11:18 AM  Result Value Ref Range   ISSUE DATE / TIME [id number]    Blood Product Unit Number [id number]    PRODUCT CODE [id number]    Unit Type and Rh 5100    Blood Product Expiration Date 797398787640   Glucose, capillary   Collection Time: 01/01/24 12:56 PM  Result Value Ref Range   Glucose-Capillary 113 (H) 70 - 99 mg/dL  Glucose, capillary   Collection Time: 01/01/24  3:21 PM  Result Value Ref Range   Glucose-Capillary 140 (H) 70 - 99 mg/dL  Glucose, capillary   Collection Time: 01/01/24  4:34 PM  Result Value Ref Range   Glucose-Capillary 152 (H) 70 - 99 mg/dL  Glucose, capillary   Collection Time: 01/01/24  8:41 PM  Result Value Ref Range   Glucose-Capillary 147 (H) 70 - 99 mg/dL  Glucose, capillary   Collection Time: 01/02/24  7:51 AM  Result Value Ref Range   Glucose-Capillary 220 (H) 70 - 99 mg/dL  CBC   Collection Time: 01/02/24  7:57 AM  Result Value Ref Range   WBC 8.4 4.0 - 10.5 K/uL   RBC 2.48 (L) 3.87 - 5.11 MIL/uL   Hemoglobin 7.7 (L) 12.0 - 15.0 g/dL   HCT 76.7 (L) 63.9 - 53.9 %   MCV 93.5 80.0 - 100.0 fL   MCH 31.0 26.0 - 34.0 pg   MCHC 33.2 30.0 - 36.0 g/dL   RDW 87.1 88.4 - 84.4 %   Platelets 88 (L) 150 - 400 K/uL   nRBC 0.0 0.0 - 0.2 %  Basic metabolic panel   Collection Time: 01/02/24  7:57 AM  Result Value Ref Range   Sodium  136 135 - 145 mmol/L   Potassium 5.0 3.5 - 5.1 mmol/L   Chloride 105 98 - 111 mmol/L   CO2 20 (L) 22 - 32 mmol/L   Glucose, Bld 229 (H) 70 - 99 mg/dL   BUN 42 (H) 8 - 23 mg/dL   Creatinine, Ser 8.00 (H) 0.44 - 1.00 mg/dL   Calcium  8.2 (L) 8.9 - 10.3 mg/dL   GFR, Estimated 23 (L) >60 mL/min   Anion gap 12 5 - 15  Phosphorus   Collection Time: 01/02/24  7:57 AM  Result Value Ref Range   Phosphorus 3.7 2.5 - 4.6 mg/dL  Magnesium   Collection Time: 01/02/24  7:57 AM  Result Value Ref Range   Magnesium 2.0 1.7 - 2.4 mg/dL  Prepare RBC (crossmatch)   Collection Time: 01/02/24 10:52 AM  Result Value Ref Range   Order Confirmation      ORDER PROCESSED BY BLOOD BANK Performed at [facility], [address] [place], [place] [address]   Glucose, capillary   Collection Time: 01/02/24 12:01 PM  Result Value Ref Range   Glucose-Capillary 218 (H) 70 - 99 mg/dL  Glucose, capillary   Collection Time: 01/02/24  4:18 PM  Result Value Ref Range   Glucose-Capillary 200 (H) 70 - 99 mg/dL  C Difficile Quick Screen w PCR reflex   Collection Time: 01/02/24  4:28 PM   Specimen: STOOL  Result Value Ref Range   C Diff antigen NEGATIVE NEGATIVE   C Diff toxin NEGATIVE NEGATIVE   C Diff interpretation No C. difficile detected.   Gastrointestinal Panel by PCR , Stool   Collection Time: 01/02/24  4:29 PM   Specimen: STOOL  Result Value Ref Range   Campylobacter species NOT DETECTED NOT DETECTED   Plesimonas shigelloides NOT DETECTED NOT DETECTED   Salmonella species NOT DETECTED NOT DETECTED   Yersinia enterocolitica NOT DETECTED NOT DETECTED   Vibrio species NOT DETECTED NOT DETECTED   Vibrio cholerae NOT DETECTED NOT DETECTED   Enteroaggregative E coli (EAEC) NOT DETECTED NOT DETECTED   Enteropathogenic E coli (EPEC) NOT DETECTED NOT DETECTED   Enterotoxigenic E coli (ETEC) NOT DETECTED NOT DETECTED   Shiga like toxin producing E coli (STEC) NOT DETECTED NOT DETECTED    Shigella/Enteroinvasive E coli (EIEC) NOT DETECTED NOT DETECTED   Cryptosporidium NOT DETECTED NOT DETECTED   Cyclospora cayetanensis NOT DETECTED NOT DETECTED   Entamoeba histolytica NOT DETECTED NOT DETECTED   Giardia lamblia NOT DETECTED NOT DETECTED   Adenovirus F40/41 NOT DETECTED NOT DETECTED   Astrovirus NOT DETECTED NOT DETECTED   Norovirus GI/GII NOT DETECTED NOT DETECTED   Rotavirus A NOT DETECTED NOT DETECTED   Sapovirus (I, II, IV, and V) NOT DETECTED NOT DETECTED  Culture, blood (Routine X 2) w Reflex to ID Panel   Collection Time: 01/02/24  6:53 PM   Specimen: BLOOD  Result Value Ref Range   Specimen Description BLOOD BLOOD RIGHT ARM    Special Requests      BOTTLES DRAWN AEROBIC AND ANAEROBIC Blood Culture adequate volume   Culture      NO GROWTH 5 DAYS Performed at [facility], [address] [place], [place] [address]    Report Status 01/07/2024 FINAL   Culture, blood (Routine X 2) w Reflex to ID Panel   Collection Time: 01/02/24  6:53 PM   Specimen: BLOOD  Result Value Ref Range   Specimen Description BLOOD BLOOD LEFT ARM    Special Requests      BOTTLES DRAWN AEROBIC AND ANAEROBIC Blood Culture adequate volume   Culture      NO GROWTH 5 DAYS Performed at [facility], [address] [place], [place] [address]    Report Status 01/07/2024 FINAL   Glucose, capillary   Collection Time: 01/02/24  9:43 PM  Result Value Ref Range   Glucose-Capillary 162 (H) 70 - 99 mg/dL  CBC   Collection Time: 01/03/24  5:34 AM  Result Value Ref Range   WBC 7.1 4.0 - 10.5 K/uL   RBC 2.91 (L) 3.87 - 5.11 MIL/uL   Hemoglobin 9.0 (L) 12.0 - 15.0 g/dL   HCT 73.3 (L) 63.9 - 53.9 %   MCV 91.4 80.0 - 100.0 fL   MCH 30.9 26.0 - 34.0 pg   MCHC 33.8 30.0 - 36.0 g/dL   RDW 86.3 88.4 - 84.4 %   Platelets 88 (L) 150 - 400 K/uL   nRBC 0.0 0.0 - 0.2 %  Basic metabolic panel   Collection Time: 01/03/24  5:34 AM  Result Value Ref Range   Sodium 139 135 - 145  mmol/L   Potassium 4.2 3.5 - 5.1 mmol/L   Chloride 107 98 - 111 mmol/L   CO2 22 22 - 32 mmol/L   Glucose, Bld 216 (H) 70 - 99 mg/dL   BUN 44 (H) 8 -  23 mg/dL   Creatinine, Ser 8.31 (H) 0.44 - 1.00 mg/dL   Calcium  8.6 (L) 8.9 - 10.3 mg/dL   GFR, Estimated 29 (L) >60 mL/min   Anion gap 10 5 - 15  Glucose, capillary   Collection Time: 01/03/24  9:20 AM  Result Value Ref Range   Glucose-Capillary 249 (H) 70 - 99 mg/dL  Glucose, capillary   Collection Time: 01/03/24 12:22 PM  Result Value Ref Range   Glucose-Capillary 239 (H) 70 - 99 mg/dL  Glucose, capillary   Collection Time: 01/03/24  4:18 PM  Result Value Ref Range   Glucose-Capillary 169 (H) 70 - 99 mg/dL  Glucose, capillary   Collection Time: 01/03/24  9:57 PM  Result Value Ref Range   Glucose-Capillary 105 (H) 70 - 99 mg/dL  Glucose, capillary   Collection Time: 01/04/24  8:35 AM  Result Value Ref Range   Glucose-Capillary 187 (H) 70 - 99 mg/dL  Hemoglobin   Collection Time: 01/04/24  9:59 AM  Result Value Ref Range   Hemoglobin 8.1 (L) 12.0 - 15.0 g/dL  Basic metabolic panel   Collection Time: 01/04/24  9:59 AM  Result Value Ref Range   Sodium 137 135 - 145 mmol/L   Potassium 4.3 3.5 - 5.1 mmol/L   Chloride 106 98 - 111 mmol/L   CO2 22 22 - 32 mmol/L   Glucose, Bld 237 (H) 70 - 99 mg/dL   BUN 49 (H) 8 - 23 mg/dL   Creatinine, Ser 8.38 (H) 0.44 - 1.00 mg/dL   Calcium  8.5 (L) 8.9 - 10.3 mg/dL   GFR, Estimated 30 (L) >60 mL/min   Anion gap 9 5 - 15  Glucose, capillary   Collection Time: 01/04/24 11:54 AM  Result Value Ref Range   Glucose-Capillary 203 (H) 70 - 99 mg/dL  Hemoglobin   Collection Time: 01/04/24  1:52 PM  Result Value Ref Range   Hemoglobin 8.1 (L) 12.0 - 15.0 g/dL  Glucose, capillary   Collection Time: 01/04/24  4:55 PM  Result Value Ref Range   Glucose-Capillary 106 (H) 70 - 99 mg/dL  Glucose, capillary   Collection Time: 01/04/24  8:31 PM  Result Value Ref Range   Glucose-Capillary 146 (H)  70 - 99 mg/dL  CBC   Collection Time: 01/05/24  4:43 AM  Result Value Ref Range   WBC 4.6 4.0 - 10.5 K/uL   RBC 2.33 (L) 3.87 - 5.11 MIL/uL   Hemoglobin 7.3 (L) 12.0 - 15.0 g/dL   HCT 77.9 (L) 63.9 - 53.9 %   MCV 94.4 80.0 - 100.0 fL   MCH 31.3 26.0 - 34.0 pg   MCHC 33.2 30.0 - 36.0 g/dL   RDW 86.7 88.4 - 84.4 %   Platelets 102 (L) 150 - 400 K/uL   nRBC 0.0 0.0 - 0.2 %  Basic metabolic panel   Collection Time: 01/05/24  4:43 AM  Result Value Ref Range   Sodium 140 135 - 145 mmol/L   Potassium 4.5 3.5 - 5.1 mmol/L   Chloride 109 98 - 111 mmol/L   CO2 22 22 - 32 mmol/L   Glucose, Bld 155 (H) 70 - 99 mg/dL   BUN 48 (H) 8 - 23 mg/dL   Creatinine, Ser 8.58 (H) 0.44 - 1.00 mg/dL   Calcium  8.3 (L) 8.9 - 10.3 mg/dL   GFR, Estimated 35 (L) >60 mL/min   Anion gap 10 5 - 15  Prepare RBC (crossmatch)   Collection Time: 01/05/24  7:30  AM  Result Value Ref Range   Order Confirmation      ORDER PROCESSED BY BLOOD BANK Performed at [facility], [address] [place], [place] [address]   Glucose, capillary   Collection Time: 01/05/24  7:42 AM  Result Value Ref Range   Glucose-Capillary 162 (H) 70 - 99 mg/dL  Type and screen [facility]   Collection Time: 01/05/24  9:25 AM  Result Value Ref Range   ABO/RH(D) O POS    Antibody Screen NEG    Sample Expiration 01/08/2024,2359    Unit Number [id number]    Blood Component Type RBC LR PHER1    Unit division 00    Status of Unit ISSUED,FINAL    Transfusion Status OK TO TRANSFUSE    Crossmatch Result      Compatible Performed at [facility], [address] [place], [place] [address]   BP[facility]   Collection Time: 01/05/24  9:25 AM  Result Value Ref Range   ISSUE DATE / TIME 797487788943    Blood Product Unit Number [id number]    PRODUCT CODE [id number]    Unit Type and Rh 5100    Blood Product Expiration Date 797398817640   Glucose, capillary   Collection Time: 01/05/24 11:42 AM   Result Value Ref Range   Glucose-Capillary 180 (H) 70 - 99 mg/dL  Hemoglobin   Collection Time: 01/05/24  2:16 PM  Result Value Ref Range   Hemoglobin 9.9 (L) 12.0 - 15.0 g/dL  Glucose, capillary   Collection Time: 01/05/24  4:46 PM  Result Value Ref Range   Glucose-Capillary 163 (H) 70 - 99 mg/dL  Glucose, capillary   Collection Time: 01/05/24  7:42 PM  Result Value Ref Range   Glucose-Capillary 143 (H) 70 - 99 mg/dL  CBC   Collection Time: 01/06/24  6:15 AM  Result Value Ref Range   WBC 3.9 (L) 4.0 - 10.5 K/uL   RBC 3.16 (L) 3.87 - 5.11 MIL/uL   Hemoglobin 9.6 (L) 12.0 - 15.0 g/dL   HCT 71.3 (L) 63.9 - 53.9 %   MCV 90.5 80.0 - 100.0 fL   MCH 30.4 26.0 - 34.0 pg   MCHC 33.6 30.0 - 36.0 g/dL   RDW 86.3 88.4 - 84.4 %   Platelets 128 (L) 150 - 400 K/uL   nRBC 0.0 0.0 - 0.2 %  Basic metabolic panel   Collection Time: 01/06/24  6:15 AM  Result Value Ref Range   Sodium 140 135 - 145 mmol/L   Potassium 4.4 3.5 - 5.1 mmol/L   Chloride 109 98 - 111 mmol/L   CO2 22 22 - 32 mmol/L   Glucose, Bld 160 (H) 70 - 99 mg/dL   BUN 33 (H) 8 - 23 mg/dL   Creatinine, Ser 8.91 (H) 0.44 - 1.00 mg/dL   Calcium  8.7 (L) 8.9 - 10.3 mg/dL   GFR, Estimated 49 (L) >60 mL/min   Anion gap 10 5 - 15  Glucose, capillary   Collection Time: 01/06/24  8:20 AM  Result Value Ref Range   Glucose-Capillary 176 (H) 70 - 99 mg/dL  Glucose, capillary   Collection Time: 01/06/24 11:16 AM  Result Value Ref Range   Glucose-Capillary 134 (H) 70 - 99 mg/dL    Lab Results  Component Value Date   NA 140 01/06/2024   CL 109 01/06/2024   K 4.4 01/06/2024   CO2 22 01/06/2024   BUN 33 (H) 01/06/2024   CREATININE 1.08 (  H) 01/06/2024   GFRNONAA 49 (L) 01/06/2024   CALCIUM  8.7 (L) 01/06/2024   PHOS 3.7 01/02/2024   ALBUMIN 4.5 05/22/2023   GLUCOSE 160 (H) 01/06/2024    Lab Results  Component Value Date   ALT 28 05/22/2023   AST 26 05/22/2023   ALKPHOS 91 05/22/2023   BILITOT 0.5 05/22/2023   Lab  Results  Component Value Date   WBC 3.9 (L) 01/06/2024   HGB 9.6 (L) 01/06/2024   HCT 28.6 (L) 01/06/2024   MCV 90.5 01/06/2024   PLT 128 (L) 01/06/2024       01/13/2024   12:03 PM 11/28/2023    2:54 PM 11/28/2023    1:19 PM 11/18/2023    1:36 PM 11/05/2023   12:25 PM  Depression screen PHQ 2/9  Decreased Interest 0 0 0 0 0  Down, Depressed, Hopeless 3 0 0 0 0  PHQ - 2 Score 3 0 0 0 0  Altered sleeping 3 0     Tired, decreased energy 0 0     Change in appetite 0 0     Feeling bad or failure about yourself  3 0     Trouble concentrating 0 0     Moving slowly or fidgety/restless 1 0     Suicidal thoughts 0 0     PHQ-9 Score 10 0     Difficult doing work/chores Somewhat difficult           01/13/2024   12:03 PM 11/28/2023    2:54 PM 11/05/2023   12:26 PM 09/20/2023    1:52 PM  GAD 7 : Generalized Anxiety Score  Nervous, Anxious, on Edge 2 0 1 3  Control/stop worrying 0 0 0 3  Worry too much - different things 3 0 0 3  Trouble relaxing 3 0 0 3  Restless 3 0 1 2  Easily annoyed or irritable 1 0 1 3  Afraid - awful might happen 2 0 0 3  Total GAD 7 Score 14 0 3 20  Anxiety Difficulty Very difficult  Somewhat difficult Somewhat difficult   Assessment & Plan:   Problem List Items Addressed This Visit     Postoperative anemia due to acute blood loss - Primary   Relevant Orders   Comprehensive metabolic panel with GFR   CBC with Differential/Platelet   Other Visit Diagnoses       Altered mental status, unspecified altered mental status type       Relevant Orders   POCT urinalysis dipstick        No orders of the defined types were placed in this encounter.   Orders Placed This Encounter  Procedures   Comprehensive metabolic panel with GFR   CBC with Differential/Platelet   POCT urinalysis dipstick    Patient Instructions  Labs today  Urine test today.  Schedule tylenol  three to four times a day, may try 1/2 hydrocodone  at a time to help pain control.   Nice to see you today. I hope you have a speedy recovery.   Follow up plan: Return if symptoms worsen or fail to improve.  Anton Blas, MD   "

## 2024-01-14 ENCOUNTER — Ambulatory Visit: Payer: Self-pay | Admitting: Family Medicine

## 2024-01-14 DIAGNOSIS — R4182 Altered mental status, unspecified: Secondary | ICD-10-CM | POA: Insufficient documentation

## 2024-01-14 DIAGNOSIS — K5900 Constipation, unspecified: Secondary | ICD-10-CM | POA: Insufficient documentation

## 2024-01-14 NOTE — Assessment & Plan Note (Addendum)
 S/p R intramedullary rod during hospitalization. Notes persistent pain to right leg which is improved with hydrocodone  but not with tylenol  - reviewed pain regimen, recommendations made for better pain control.  No obvious complications noted today.  Encouraged good PO intake including protein to speed healing.  Discussed premiere protein vs glucerna supplementation, drink 30 min before meal as orexigenic.  She did not start lovenox  - encouraged she start this. If does not start, will need to restart aspirin .

## 2024-01-14 NOTE — Assessment & Plan Note (Signed)
 Suggested by initial hip xray (probable nondisplaced fractures of the left superior pubic ramus) - non-surgical management recommended. This could contribute to ongoing pain however describes pain more in right leg.

## 2024-01-14 NOTE — Assessment & Plan Note (Signed)
 Anticipate opiate related.  Managing with miralax  PRN, may be improving since she's starting to eat more regularly.  Discussed miralax  1/2 packet daily preventatively while on opiate, hold for loose stools.

## 2024-01-14 NOTE — Assessment & Plan Note (Signed)
 Continues lantus  and tradjenta .

## 2024-01-14 NOTE — Assessment & Plan Note (Signed)
 Discharge hemoglobin 9.6, s/p 2u pRBC.  Notes ongoing orthostatic dizziness with low normal BPs.   Update CBC today.  She did not start lovenox  - encouraged she start this. If does not start, will need to restart aspirin .

## 2024-01-14 NOTE — Assessment & Plan Note (Signed)
 BP running low in setting of new acute blood loss anemia - toprol  XL dose dropped from 50mg  to 25mg  in hospital. She is tachycardic - consider return to previous dose pending lab results today.

## 2024-01-14 NOTE — Assessment & Plan Note (Signed)
 Episode earlier this morning - likely multifactorial including ongoing leg pain, pain medication, recent. No obvious signs of infection. Tachycardic but not significantly irregular Check UA, CMP, CBC today.

## 2024-01-15 NOTE — Patient Outreach (Signed)
 RNCM - follow up after hospitalization, patient saw PCP, spoe with daughter and then patient, declined to schedule at this time, agreed to call back in 1 month and will notify if want to continue with [facility] services.

## 2024-01-15 NOTE — Telephone Encounter (Signed)
 Completed updated forms received.   Faxed to [employer] at [phone]  Spoke with patient's daughter, Verneita, she will pick up a copy at the front desk this week.

## 2024-01-15 NOTE — Telephone Encounter (Signed)
 Completed and placed on Erin's desk.

## 2024-01-17 ENCOUNTER — Inpatient Hospital Stay: Admitting: Primary Care

## 2024-01-17 ENCOUNTER — Telehealth: Payer: Self-pay | Admitting: *Deleted

## 2024-01-17 NOTE — Patient Outreach (Signed)
 Phone call from patient's daughter requesting assistance with in home care and ramp assistance. Patient recently discharged from the hospital with PT/OT., however patient requiring more care. Resources provided for wheelchair ramp assistance through [facility] [employer].  CSW to provide a list of private duty care agencies for additional in home care support.  Valisha Heslin, LCSW [place]  [facility], [facility] Licensed Clinical Social Worker  Direct Dial: [phone]

## 2024-01-20 ENCOUNTER — Ambulatory Visit (INDEPENDENT_AMBULATORY_CARE_PROVIDER_SITE_OTHER)

## 2024-01-20 VITALS — BP 114/68 | HR 94 | Ht 62.0 in | Wt 115.0 lb

## 2024-01-20 DIAGNOSIS — Z0000 Encounter for general adult medical examination without abnormal findings: Secondary | ICD-10-CM

## 2024-01-20 NOTE — Progress Notes (Signed)
 " I connected with  Laura Ferguson on 01/20/2024 by a audio enabled telemedicine application and verified that I am speaking with the correct person using two identifiers.  Patient Location: Home  Provider Location: Home Office  Persons Participating in Visit: Patient.  I discussed the limitations of evaluation and management by telemedicine. The patient expressed understanding and agreed to proceed.  Vital Signs: Because this visit was a virtual/telehealth visit, some criteria may be missing or patient reported. Any vitals not documented were not able to be obtained and vitals that have been documented are patient reported.  Chief Complaint  Patient presents with   Medicare Wellness     Subjective:   Laura Ferguson is a 89 y.o. female who presents for a Medicare Annual Wellness Visit.  Visit info / Clinical Intake: Medicare Wellness Visit Type:: Subsequent Annual Wellness Visit Persons participating in visit and providing information:: patient Medicare Wellness Visit Mode:: Telephone If telephone:: video declined Since this visit was completed virtually, some vitals may be partially provided or unavailable. Missing vitals are due to the limitations of the virtual format.: Documented vitals are patient reported If Telephone or Video please confirm:: I connected with patient using audio/video enable telemedicine. I verified patient identity with two identifiers, discussed telehealth limitations, and patient agreed to proceed. Patient Location:: home Provider Location:: home office Interpreter Needed?: No Pre-visit prep was completed: yes AWV questionnaire completed by patient prior to visit?: yes Date:: 01/16/24 (01/16/24 and 01/17/2024) Living arrangements:: (Patient-Rptd) with family/others Patient's Overall Health Status Rating: (!) (Patient-Rptd) fair Typical amount of pain: (Patient-Rptd) some Does pain affect daily life?: (!) (Patient-Rptd) yes Are you currently prescribed opioids?:  no  Dietary Habits and Nutritional Risks How many meals a day?: (Patient-Rptd) 3 Eats fruit and vegetables daily?: (Patient-Rptd) yes Most meals are obtained by: (Patient-Rptd) having others provide food In the last 2 weeks, have you had any of the following?: none Diabetic:: (!) yes Any non-healing wounds?: no How often do you check your BS?: continuous glucose monitor Would you like to be referred to a Nutritionist or for Diabetic Management? : no  Functional Status Activities of Daily Living (to include ambulation/medication): (!) (Patient-Rptd) Needs Assist Ambulation: Independent with device- listed below Home Assistive Devices/Equipment: Laura Ferguson (specify Type); Wheelchair; Beside Commode Medication Administration: (Patient-Rptd) Dependent Home Management (perform basic housework or laundry): (Patient-Rptd) Needs assistance (comment) Manage your own finances?: (Patient-Rptd) yes Primary transportation is: (Patient-Rptd) family / friends Concerns about vision?: no *vision screening is required for WTM* Concerns about hearing?: no  Fall Screening Falls in the past year?: (Patient-Rptd) 1 Number of falls in past year: (Patient-Rptd) 1 Was there an injury with Fall?: (Patient-Rptd) 1 Fall Risk Category Calculator: (Patient-Rptd) 3 Patient Fall Risk Level: (Patient-Rptd) High Fall Risk  Fall Risk Patient at Risk for Falls Due to: History of fall(s) Fall risk Follow up: Falls evaluation completed; Education provided; Falls prevention discussed  Home and Transportation Safety: All rugs have non-skid backing?: (Patient-Rptd) yes All stairs or steps have railings?: (Patient-Rptd) yes Grab bars in the bathtub or shower?: (Patient-Rptd) yes Have non-skid surface in bathtub or shower?: (Patient-Rptd) yes Good home lighting?: (Patient-Rptd) yes Regular seat belt use?: (Patient-Rptd) yes Hospital stays in the last year:: (!) (Patient-Rptd) yes How many hospital stays::  (Patient-Rptd) 1  Cognitive Assessment Difficulty concentrating, remembering, or making decisions? : (Patient-Rptd) yes Will 6CIT or Mini Cog be Completed: yes What year is it?: 0 points What month is it?: 0 points Give patient an address  phrase to remember (5 components): remember words apple , table , penny About what time is it?: 0 points Count backwards from 20 to 1: 0 points Say the months of the year in reverse: 2 points Repeat the address phrase from earlier: 0 points 6 CIT Score: 2 points  Advance Directives (For Healthcare) Does Patient Have a Medical Advance Directive?: No Would patient like information on creating a medical advance directive?: No - Patient declined  Reviewed/Updated  Reviewed/Updated: Reviewed All (Medical, Surgical, Family, Medications, Allergies, Care Teams, Patient Goals)    Allergies (verified) Patient has no known allergies.   Current Medications (verified) Outpatient Encounter Medications as of 01/20/2024  Medication Sig   acetaminophen  (TYLENOL ) 325 MG tablet Take 2 tablets (650 mg total) by mouth every 6 (six) hours.   Blood Glucose Monitoring Suppl (ACCU-CHEK GUIDE) w/Device KIT Use as instructed to check blood sugar.   Cholecalciferol  (VITAMIN D ) 50 MCG (2000 UT) CAPS Take 1 capsule (2,000 Units total) by mouth daily at 12 noon.   Continuous Glucose Receiver (FREESTYLE LIBRE 3 READER) DEVI Use to check blood sugar continuously (broken reader, try for lost/damaged med override). E11.65, Z79.4   Continuous Glucose Sensor (FREESTYLE LIBRE 3 PLUS SENSOR) MISC Change sensor every 15 days.   Continuous Glucose Sensor (FREESTYLE LIBRE 3 SENSOR) MISC USE ONE SENSOR EVERY 14 DAYS TO CONTINOUSLY MONITOR GLUCOSE   enoxaparin  (LOVENOX ) 30 MG/0.3ML injection Inject 0.3 mLs (30 mg total) into the skin daily.   ezetimibe  (ZETIA ) 10 MG tablet Take 1 tablet (10 mg total) by mouth every evening.   glucose blood (ACCU-CHEK GUIDE) test strip USE TO CHECK SUGAR  THREE TIMES DAILY AS DIRECTED   HYDROcodone -acetaminophen  (NORCO/VICODIN) 5-325 MG tablet Take 1 tablet by mouth every 6 (six) hours as needed for severe pain (pain score 7-10).   Insulin  Pen Needle (BD PEN NEEDLE NANO 2ND GEN) 32G X 4 MM MISC USE 1 PEN NEEDLE WITH INSULIN  PEN once daily   LANTUS  SOLOSTAR 100 UNIT/ML Solostar Pen Inject 20 Units into the skin daily.   linagliptin  (TRADJENTA ) 5 MG TABS tablet Take 1 tablet (5 mg total) by mouth daily at 12 noon. for diabetes.   metoprolol  succinate (TOPROL -XL) 25 MG 24 hr tablet Take 1 tablet (25 mg total) by mouth daily. Take with or immediately following a meal.   Multiple Vitamin (MULTIVITAMIN) capsule Take 1 capsule by mouth daily.   pantoprazole  (PROTONIX ) 40 MG tablet Take 1 tablet (40 mg total) by mouth in the morning.   Polyethyl Glycol-Propyl Glycol (SYSTANE OP) Apply 2 drops to eye 2 (two) times daily.    rosuvastatin  (CRESTOR ) 10 MG tablet Take 1 tablet (10 mg total) by mouth daily at 12 noon.   sertraline  (ZOLOFT ) 25 MG tablet Take 1 tablet (25 mg total) by mouth daily at 12 noon. for anxiety and depression.   TRAVATAN Z 0.004 % SOLN ophthalmic solution Place 1 drop into both eyes at bedtime.   No facility-administered encounter medications on file as of 01/20/2024.    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 IVAR Sor)   CORONARY ARTERY BYPASS GRAFT  05/09/2007   LIMA to LAD, veing to intermediate; SVG to OM1 & OM2; SVG to acute marginal & distal RCA (Dr. Kerrin)   FRACTURE SURGERY     HIP SURGERY Right 12/31/2023   INTRAMEDULLARY (IM) NAIL INTERTROCHANTERIC Right 01/01/2024   Procedure: FIXATION, FRACTURE, INTERTROCHANTERIC, WITH INTRAMEDULLARY ROD;  Surgeon: Lorelle Hussar, MD;  Location: [facility] ORS;  Service: Orthopedics;  Laterality: Right;   TRANSTHORACIC ECHOCARDIOGRAM  02/13/2010   EF =>55%, vigorous contraction EF 65%; LA mild-mod dilated; IV normal diameter - normal CVP; trace MR; mild TR; trace AV regurg   Family History  Problem Relation Age of Onset   Cancer Mother    Diabetes Sister    Cancer Child    Social History   Occupational History   Not on file  Tobacco Use   Smoking status: Never   Smokeless tobacco: Never   Tobacco comments:    quit 1960's  smoked very lightly.  Vaping Use   Vaping status: Never Used  Substance and Sexual Activity   Alcohol  use: Never   Drug use: Never   Sexual activity: Not Currently   Tobacco Counseling Counseling given: Not Answered Tobacco comments: quit 1960's  smoked very lightly.  SDOH Screenings   Food Insecurity: No Food Insecurity (01/17/2024)  Recent Concern: Food Insecurity - Food Insecurity Present (12/26/2023)   [facility]  Housing: Low Risk (01/17/2024)  Transportation Needs: No Transportation Needs (01/17/2024)   Received from [facility]  Utilities: Not At Risk (01/20/2024)  Alcohol  Screen: Low Risk (01/22/2023)  Depression (PHQ2-9): Low Risk (01/20/2024)  Recent Concern:  Depression (PHQ2-9) - Medium Risk (01/13/2024)  Financial Resource Strain: Low Risk  (01/17/2024)   Received from [facility]  Physical Activity: Sufficiently Active (01/17/2024)  Social Connections: Unknown (01/17/2024)  Recent Concern: Social Connections - Moderately Isolated (12/31/2023)  Stress: No Stress Concern Present (01/17/2024)  Tobacco Use: Low Risk (01/20/2024)  Health Literacy: Adequate Health Literacy (01/20/2024)   See flowsheets for full screening details  Depression Screen PHQ 2 & 9 Depression Scale- Over the past 2 weeks, how often have you been bothered by any of the following problems? Little interest or pleasure in doing things: 1 Feeling down, depressed, or hopeless (PHQ Adolescent also includes...irritable): 1 PHQ-2 Total Score: 2 Trouble falling or staying asleep, or sleeping too much: 1 Feeling tired or having little energy: 0 Poor appetite or overeating (PHQ Adolescent also includes...weight loss): 0 Feeling bad about yourself - or that you are a failure or have let yourself or your family down: 0 Trouble concentrating on things, such as reading the newspaper or watching television (PHQ Adolescent also includes...like school work): 0 Moving or speaking so slowly that other people could have noticed. Or the opposite - being so fidgety or restless  that you have been moving around a lot more than usual: 1 Thoughts that you would be better off dead, or of hurting yourself in some way: 0 PHQ-9 Total Score: 4 If you checked off any problems, how difficult have these problems made it for you to do your work, take care of things at home, or get along with other people?: Somewhat difficult  Depression Treatment Depression Interventions/Treatment : Medication; PHQ2-9 Score <4 Follow-up Not Indicated     Goals Addressed             This Visit's Progress    Patient Stated   On track    Would like to maintain current routine             Objective:     Today's Vitals   01/20/24 1029  BP: 114/68  Pulse: 94  Weight: 115 lb (52.2 kg)  Height: 5' 2 (1.575 m)   Body mass index is 21.03 kg/m.  Hearing/Vision screen Hearing Screening - Comments:: Wears hearing aids  Vision Screening - Comments:: Patient wears glasses. Patient sees Nola Novak for her eyes  Immunizations and Health Maintenance Health Maintenance  Topic Date Due   Zoster Vaccines- Shingrix (1 of 2) Never done   COVID-19 Vaccine (6 - 2025-26 season) 09/16/2023   Medicare Annual Wellness (AWV)  01/22/2024   Bone Density Scan  01/22/2024 (Originally 06/14/1997)   FOOT EXAM  05/08/2024   HEMOGLOBIN A1C  05/27/2024   OPHTHALMOLOGY EXAM  10/07/2024   Pneumococcal Vaccine: 50+ Years  Completed   Influenza Vaccine  Completed   Meningococcal B Vaccine  Aged Out   DTaP/Tdap/Td  Discontinued        Assessment/Plan:  This is a routine wellness examination for Vidhi.  Patient Care Team: Gretta Comer POUR, NP as PCP - General (Internal Medicine) End, Lonni, MD as PCP - Cardiology (Cardiology) Fate Morna SAILOR, [facility] (Inactive) as Pharmacist (Pharmacist) Novak Adine Anes, MD as Consulting Physician (Ophthalmology) Devra Lands, RN as [facility] Care Management Charlene Debby BROCKS, PA-C as Physician Assistant (Orthopedic Surgery)  I have personally reviewed and noted the following in the patients chart:   Medical and social history Use of alcohol , tobacco or illicit drugs  Current medications and supplements including opioid prescriptions. Functional ability and status Nutritional status Physical activity Advanced directives List of other physicians Hospitalizations, surgeries, and ER visits in previous 12 months Vitals Screenings to include cognitive, depression, and falls Referrals and appointments  No orders of the defined types were placed in this encounter.  In addition, I have reviewed and discussed with patient certain preventive protocols, quality  metrics, and best practice recommendations. A written personalized care plan for preventive services as well as general preventive health recommendations were provided to patient.   Lyle POUR Right, [place]   01/20/2024   No follow-ups on file.  After Visit Summary: (MyChart) Due to this being a telephonic visit, the after visit summary with patients personalized plan was offered to patient via MyChart   No voiced or noted concerns at this time Vaccines not given: covid and shingles  declined today  "

## 2024-01-20 NOTE — Patient Instructions (Signed)
 Laura Ferguson,  Thank you for taking the time for your Medicare Wellness Visit. I appreciate your continued commitment to your health goals. Please review the care plan we discussed, and feel free to reach out if I can assist you further.  Please note that Annual Wellness Visits do not include a physical exam. Some assessments may be limited, especially if the visit was conducted virtually. If needed, we may recommend an in-person follow-up with your provider.  Ongoing Care Seeing your primary care provider every 3 to 6 months helps us  monitor your health and provide consistent, personalized care.   Referrals If a referral was made during today's visit and you haven't received any updates within two weeks, please contact the referred provider directly to check on the status.  Recommended Screenings:  Health Maintenance  Topic Date Due   Zoster (Shingles) Vaccine (1 of 2) Never done   COVID-19 Vaccine (6 - 2025-26 season) 09/16/2023   Medicare Annual Wellness Visit  01/22/2024   Osteoporosis screening with Bone Density Scan  01/22/2024*   Complete foot exam   05/08/2024   Hemoglobin A1C  05/27/2024   Eye exam for diabetics  10/07/2024   Pneumococcal Vaccine for age over 11  Completed   Flu Shot  Completed   Meningitis B Vaccine  Aged Out   DTaP/Tdap/Td vaccine  Discontinued  *Topic was postponed. The date shown is not the original due date.       01/16/2024   10:00 AM  Advanced Directives  Does Patient Have a Medical Advance Directive? No  Would patient like information on creating a medical advance directive? No - Patient declined    Vision: Annual vision screenings are recommended for early detection of glaucoma, cataracts, and diabetic retinopathy. These exams can also reveal signs of chronic conditions such as diabetes and high blood pressure.  Dental: Annual dental screenings help detect early signs of oral cancer, gum disease, and other conditions linked to overall health,  including heart disease and diabetes.  Please see the attached documents for additional preventive care recommendations.

## 2024-01-21 ENCOUNTER — Observation Stay
Admission: EM | Admit: 2024-01-21 | Discharge: 2024-01-23 | Disposition: A | Discharge status: Home Health | Attending: Emergency Medicine | Admitting: Emergency Medicine

## 2024-01-21 ENCOUNTER — Emergency Department

## 2024-01-21 ENCOUNTER — Other Ambulatory Visit: Payer: Self-pay

## 2024-01-21 ENCOUNTER — Ambulatory Visit: Payer: Self-pay

## 2024-01-21 DIAGNOSIS — F0390 Unspecified dementia without behavioral disturbance: Secondary | ICD-10-CM | POA: Insufficient documentation

## 2024-01-21 DIAGNOSIS — R636 Underweight: Secondary | ICD-10-CM | POA: Insufficient documentation

## 2024-01-21 DIAGNOSIS — I951 Orthostatic hypotension: Secondary | ICD-10-CM | POA: Insufficient documentation

## 2024-01-21 DIAGNOSIS — Z79899 Other long term (current) drug therapy: Secondary | ICD-10-CM | POA: Diagnosis not present

## 2024-01-21 DIAGNOSIS — Z794 Long term (current) use of insulin: Secondary | ICD-10-CM | POA: Insufficient documentation

## 2024-01-21 DIAGNOSIS — E119 Type 2 diabetes mellitus without complications: Secondary | ICD-10-CM

## 2024-01-21 DIAGNOSIS — M810 Age-related osteoporosis without current pathological fracture: Secondary | ICD-10-CM | POA: Diagnosis not present

## 2024-01-21 DIAGNOSIS — I129 Hypertensive chronic kidney disease with stage 1 through stage 4 chronic kidney disease, or unspecified chronic kidney disease: Secondary | ICD-10-CM | POA: Insufficient documentation

## 2024-01-21 DIAGNOSIS — Z681 Body mass index (BMI) 19 or less, adult: Secondary | ICD-10-CM | POA: Insufficient documentation

## 2024-01-21 DIAGNOSIS — Z8673 Personal history of transient ischemic attack (TIA), and cerebral infarction without residual deficits: Secondary | ICD-10-CM | POA: Diagnosis not present

## 2024-01-21 DIAGNOSIS — Z951 Presence of aortocoronary bypass graft: Secondary | ICD-10-CM | POA: Insufficient documentation

## 2024-01-21 DIAGNOSIS — I2510 Atherosclerotic heart disease of native coronary artery without angina pectoris: Secondary | ICD-10-CM

## 2024-01-21 DIAGNOSIS — R42 Dizziness and giddiness: Secondary | ICD-10-CM | POA: Diagnosis present

## 2024-01-21 DIAGNOSIS — I10 Essential (primary) hypertension: Secondary | ICD-10-CM | POA: Diagnosis present

## 2024-01-21 DIAGNOSIS — R55 Syncope and collapse: Secondary | ICD-10-CM

## 2024-01-21 DIAGNOSIS — E11319 Type 2 diabetes mellitus with unspecified diabetic retinopathy without macular edema: Secondary | ICD-10-CM | POA: Diagnosis not present

## 2024-01-21 DIAGNOSIS — E785 Hyperlipidemia, unspecified: Secondary | ICD-10-CM

## 2024-01-21 DIAGNOSIS — N179 Acute kidney failure, unspecified: Secondary | ICD-10-CM | POA: Diagnosis not present

## 2024-01-21 DIAGNOSIS — F418 Other specified anxiety disorders: Secondary | ICD-10-CM | POA: Diagnosis not present

## 2024-01-21 DIAGNOSIS — N1831 Chronic kidney disease, stage 3a: Secondary | ICD-10-CM | POA: Diagnosis not present

## 2024-01-21 DIAGNOSIS — F419 Anxiety disorder, unspecified: Secondary | ICD-10-CM | POA: Diagnosis present

## 2024-01-21 LAB — COMPREHENSIVE METABOLIC PANEL WITH GFR
ALT: 25 U/L (ref 0–44)
AST: 24 U/L (ref 15–41)
Albumin: 3.9 g/dL (ref 3.5–5.0)
Alkaline Phosphatase: 134 U/L — ABNORMAL HIGH (ref 38–126)
Anion gap: 15 (ref 5–15)
BUN: 31 mg/dL — ABNORMAL HIGH (ref 8–23)
CO2: 20 mmol/L — ABNORMAL LOW (ref 22–32)
Calcium: 9.5 mg/dL (ref 8.9–10.3)
Chloride: 105 mmol/L (ref 98–111)
Creatinine, Ser: 1.54 mg/dL — ABNORMAL HIGH (ref 0.44–1.00)
GFR, Estimated: 32 mL/min — ABNORMAL LOW
Glucose, Bld: 181 mg/dL — ABNORMAL HIGH (ref 70–99)
Potassium: 4.2 mmol/L (ref 3.5–5.1)
Sodium: 140 mmol/L (ref 135–145)
Total Bilirubin: 0.7 mg/dL (ref 0.0–1.2)
Total Protein: 7.1 g/dL (ref 6.5–8.1)

## 2024-01-21 LAB — CBC
HCT: 34.7 % — ABNORMAL LOW (ref 36.0–46.0)
Hemoglobin: 11.2 g/dL — ABNORMAL LOW (ref 12.0–15.0)
MCH: 30.4 pg (ref 26.0–34.0)
MCHC: 32.3 g/dL (ref 30.0–36.0)
MCV: 94.3 fL (ref 80.0–100.0)
Platelets: 191 K/uL (ref 150–400)
RBC: 3.68 MIL/uL — ABNORMAL LOW (ref 3.87–5.11)
RDW: 13.6 % (ref 11.5–15.5)
WBC: 4.5 K/uL (ref 4.0–10.5)
nRBC: 0 % (ref 0.0–0.2)

## 2024-01-21 LAB — RESP PANEL BY RT-PCR (RSV, FLU A&B, COVID)  RVPGX2
Influenza A by PCR: NEGATIVE
Influenza B by PCR: NEGATIVE
Resp Syncytial Virus by PCR: NEGATIVE
SARS Coronavirus 2 by RT PCR: NEGATIVE

## 2024-01-21 MED ORDER — SODIUM CHLORIDE 0.9 % IV BOLUS
1000.0000 mL | Freq: Once | INTRAVENOUS | Status: AC
  Administered 2024-01-21: 1000 mL via INTRAVENOUS

## 2024-01-21 NOTE — Telephone Encounter (Signed)
 Looks like pt is at ED. Fyi to [place].

## 2024-01-21 NOTE — ED Triage Notes (Signed)
 Pt presents to the ED via POV from home. Pt had a recent right hip fx. Pt was at physical therapy today and they said that her BP was 89/40. Pt reports continued dizziness. Pt A&Ox4. BP 106/79 at time of triage. Pt does have a mild hx of dementia.

## 2024-01-21 NOTE — Telephone Encounter (Signed)
 FYI Only or Action Required?: FYI only for provider: ED advised. Dizziness with standing, no appts today in clinic.   Patient was last seen in primary care on 01/13/2024 by Rilla Baller, MD.  Called Nurse Triage reporting Dizziness.  Symptoms began several weeks ago.  Interventions attempted: Nothing.  Symptoms are: gradually worsening.  Triage Disposition: See HCP Within 4 Hours (Or PCP Triage)  Patient/caregiver understands and will follow disposition?: Unsure  Copied from CRM #[id number]. Topic: Clinical - Red Word Triage >> Jan 21, 2024  2:19 PM Mesmerise C wrote: Red Word that prompted transfer to Nurse Triage: Zebedee states patient has been dizzy and out of it more as of lately Reason for Disposition  [1] Dizziness caused by heat exposure, sudden standing, or poor fluid intake AND [2] no improvement after 2 hours of rest and fluids  Answer Assessment - Initial Assessment Questions 1. DESCRIPTION: Describe your dizziness.     Episodes of dizziness, especially with standing 2. LIGHTHEADED: Do you feel lightheaded? (e.g., somewhat faint, woozy, weak upon standing)     Feels like could pass out,  4. SEVERITY: How bad is it?  Do you feel like you are going to faint? Can you stand and walk?     Pt does not walk per caller since the leg surgery 5. ONSET:  When did the dizziness begin?     Weeks ago 6. AGGRAVATING FACTORS: Does anything make it worse? (e.g., standing, change in head position)     standing 8. CAUSE: What do you think is causing the dizziness? (e.g., decreased fluids or food, diarrhea, emotional distress, heat exposure, new medicine, sudden standing, vomiting; unknown)     Per caller pt has been drinking close to 8 glasses per day 9. RECURRENT SYMPTOM: Have you had dizziness before? If Yes, ask: When was the last time? What happened that time?     Hx of vertigo 10. OTHER SYMPTOMS: Do you have any other symptoms? (e.g., fever, chest pain,  vomiting, diarrhea, bleeding)       Denies Denies syncope. Has episodes of hypotension.  Protocols used: Dizziness - Lightheadedness-A-AH

## 2024-01-21 NOTE — Telephone Encounter (Signed)
 Noted

## 2024-01-21 NOTE — ED Notes (Signed)
 Patient transported to MRI

## 2024-01-21 NOTE — ED Provider Notes (Signed)
----------------------------------------- °  11:36 PM on 01/21/2024 -----------------------------------------  Blood pressure 117/73, pulse 74, temperature 98.2 F (36.8 C), temperature source Oral, resp. rate 18, height 5' 2 (1.575 m), weight 52 kg, SpO2 100%.  Assuming care from Dr. Viviann.  In short, Laura Ferguson is a 89 y.o. female with a chief complaint of dizziness.  Refer to the original H&P for additional details.  The current plan of care is to reassess following additional IVF and orthostatics.  ----------------------------------------- 3:10 AM on 01/22/2024 ----------------------------------------- Patient continues to have significant orthostasis despite a liter of IV fluids, becomes dizzy and unable to walk when attempting to stand.  Case discussed with hospitalist for admission.     Medications  sodium chloride  0.9 % bolus 1,000 mL (0 mLs Intravenous Stopped 01/21/24 2300)     ED Discharge Orders     None      Final diagnoses:  Orthostatic dizziness  Type 2 diabetes mellitus without complication, with long-term current use of insulin  (HCC)      [name], MD 01/22/24 [phone]

## 2024-01-21 NOTE — ED Provider Notes (Signed)
 "  [facility] Provider Note    Event Date/Time   First MD Initiated Contact with Patient 01/21/24 1858     (approximate)   History   Chief Complaint: Hypotension   HPI  Laura Ferguson is a 89 y.o. female with a history of hypertension, diabetes, CABG, chronic orthostatic dizziness who was brought to the ED due to worsening of her orthostatic symptoms over the past 3 weeks ever since having surgery for right hip fracture.  Family at bedside notes that patient has had minimal oral intake since surgery.  No vomiting or diarrhea.  She has been able to walk, and is working with physical therapy.  She is taking Tylenol  for pain predominantly with half a tablet of hydrocodone  at bedtime.  No chest pain or shortness of breath or fever.  Saw her doctor December 2019, had labs and urinalysis done which were unremarkable.  Family notes that neurology had ordered MRI of the brain just prior to the patient's fall and hip fracture, MRI has not been completed yet and they are anxious to pursue that given the patient's cognitive decline and chronic dizziness symptoms      Past Medical History:  Diagnosis Date   Acute bilateral low back pain with right-sided sciatica 03/16/2022   Acute midline thoracic back pain 03/16/2022   Acute pain of left foot 03/09/2019   Anemia    Anxiety    Arthritis    Blood transfusion without reported diagnosis    CAD (coronary artery disease)    Depression    GERD (gastroesophageal reflux disease)    Glaucoma    History of nuclear stress test 11/22/2011   bruce myoview; normal pattern of perfusio; post-stress EF 76%; low risk scan   Hyperlipidemia    Hypertension    Osteoporosis    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   LIMA to LAD, SVG to ramus intermedius, SVG to OM1 & OM2, SVG to acute marginal, SVG to distal RCA   Stroke (HCC)    Type 2 diabetes mellitus [facility])     Current Outpatient Rx   Order #: [id number] Class: OTC   Order #: [id number] Class:  Normal   Order #: [id number] Class: Normal   Order #: [id number] Class: Normal   Order #: [id number] Class: Normal   Order #: [id number] Class: No Print   Order #: [id number] Class: Normal   Order #: [id number] Class: Normal   Order #: [id number] Class: Historical Med   Order #: [id number] Class: Normal   Order #: [id number] Class: Historical Med   Order #: [id number] Class: Normal   Order #: [id number] Class: Normal   Order #: [id number] Class: Historical Med   Order #: [id number] Class: Normal   Order #: [id number] Class: Normal   Order #: [id number] Class: Normal   Order #: [id number] Class: Normal   Order #: [id number] Class: Normal   Order #: [id number] Class: Normal    Past Surgical History:  Procedure Laterality Date   CARDIAC CATHETERIZATION  05/07/2007   EF 55%, focal mild hypocontractility in mid-distal inferior wall & mid posterolateral wall; severe multivessel CAD - susequent CABGx6 (Dr. IVAR Sor)   CORONARY ARTERY BYPASS GRAFT  05/09/2007   LIMA to LAD, veing to intermediate; SVG to OM1 & OM2; SVG to acute marginal & distal RCA (Dr. Kerrin)   FRACTURE SURGERY     HIP SURGERY Right 12/31/2023   INTRAMEDULLARY (IM) NAIL INTERTROCHANTERIC Right 01/01/2024   Procedure: FIXATION, FRACTURE, INTERTROCHANTERIC, WITH INTRAMEDULLARY ROD;  Surgeon: Lorelle Hussar, MD;  Location: [facility] ORS;  Service: Orthopedics;  Laterality: Right;  TRANSTHORACIC ECHOCARDIOGRAM  02/13/2010   EF =>55%, vigorous contraction EF 65%; LA mild-mod dilated; IV normal diameter - normal CVP; trace MR; mild TR; trace AV regurg    Physical Exam   Triage Vital Signs: ED Triage Vitals  Encounter Vitals Group     BP 01/21/24 1629 106/79     Girls Systolic BP Percentile --      Girls Diastolic BP Percentile --      Boys Systolic BP Percentile --      Boys Diastolic BP Percentile --      Pulse Rate 01/21/24 1629 (!) 110     Resp 01/21/24 1629 18     Temp 01/21/24 1629 98 F (36.7 C)     Temp Source 01/21/24 1629 Oral     SpO2  01/21/24 1629 99 %     Weight 01/21/24 1630 114 lb 10.2 oz (52 kg)     Height 01/21/24 1630 5' 2 (1.575 m)     Head Circumference --      Peak Flow --      Pain Score 01/21/24 1629 8     Pain Loc --      Pain Education --      Exclude from Growth Chart --     Most recent vital signs: Vitals:   01/21/24 1932 01/21/24 2302  BP: 133/72 117/73  Pulse: 71 74  Resp: 18 18  Temp: 98.1 F (36.7 C) 98.2 F (36.8 C)  SpO2: 100% 100%    General: Awake, no distress.  CV:  Good peripheral perfusion.  Tachycardia heart rate 110 Resp:  Normal effort.  Clear lungs Abd:  No distention.  Soft nontender Other:  Extremity stable.  No signs of trauma.  Dry oral mucosa.  Cranial nerves III through XII intact.  Neuroexam is nonfocal.   ED Results / Procedures / Treatments   Labs (all labs ordered are listed, but only abnormal results are displayed) Labs Reviewed  COMPREHENSIVE METABOLIC PANEL WITH GFR - Abnormal; Notable for the following components:      Result Value   CO2 20 (*)    Glucose, Bld 181 (*)    BUN 31 (*)    Creatinine, Ser 1.54 (*)    Alkaline Phosphatase 134 (*)    GFR, Estimated 32 (*)    All other components within normal limits  CBC - Abnormal; Notable for the following components:   RBC 3.68 (*)    Hemoglobin 11.2 (*)    HCT 34.7 (*)    All other components within normal limits  RESP PANEL BY RT-PCR (RSV, FLU A&B, COVID)  RVPGX2  URINALYSIS, ROUTINE W REFLEX MICROSCOPIC     EKG Interpreted by me Sinus tachycardia rate 114.  Normal axis and intervals.  Normal QRS ST segments and T waves   RADIOLOGY MRI brain interpreted by me, no obvious mass.  Radiology report reviewed noting remote infarct   PROCEDURES:  Procedures   MEDICATIONS ORDERED IN ED: Medications  sodium chloride  0.9 % bolus 1,000 mL (1,000 mLs Intravenous New Bag/Given 01/21/24 2030)     IMPRESSION / MDM / ASSESSMENT AND PLAN / ED COURSE  I reviewed the triage vital signs and the  nursing notes.  DDx: Dehydration, electrolyte derangement, anemia, AKI, COVID, influenza, UTI, intracranial mass  Patient's presentation is most consistent with acute presentation with potential threat to life or bodily function.  Patient presents with worsening of her chronic orthostatic symptoms.  At home blood pressure was 90/40 standing today.  She has had poor oral intake.  Suspect dehydration.  Recently had urinalysis which was unremarkable, labs today show some mild renal insufficiency above her baseline CKD.  With fluids, obtain MRI.   ----------------------------------------- 11:40 PM on 01/21/2024 ----------------------------------------- Patient reports feeling better.  Tolerating p.o.  Will check orthostatics     FINAL CLINICAL IMPRESSION(S) / ED DIAGNOSES   Final diagnoses:  Orthostatic dizziness  Type 2 diabetes mellitus without complication, with long-term current use of insulin  (HCC)     Rx / DC Orders   ED Discharge Orders     None        Note:  This document was prepared using Dragon voice recognition software and may include unintentional dictation errors.   Viviann Pastor, MD 01/21/24 2341  "

## 2024-01-22 ENCOUNTER — Observation Stay: Admit: 2024-01-22

## 2024-01-22 ENCOUNTER — Observation Stay

## 2024-01-22 DIAGNOSIS — R42 Dizziness and giddiness: Secondary | ICD-10-CM | POA: Diagnosis not present

## 2024-01-22 DIAGNOSIS — R55 Syncope and collapse: Secondary | ICD-10-CM | POA: Diagnosis not present

## 2024-01-22 LAB — CBC
HCT: 29.2 % — ABNORMAL LOW (ref 36.0–46.0)
Hemoglobin: 9.4 g/dL — ABNORMAL LOW (ref 12.0–15.0)
MCH: 30.5 pg (ref 26.0–34.0)
MCHC: 32.2 g/dL (ref 30.0–36.0)
MCV: 94.8 fL (ref 80.0–100.0)
Platelets: 151 K/uL (ref 150–400)
RBC: 3.08 MIL/uL — ABNORMAL LOW (ref 3.87–5.11)
RDW: 13.6 % (ref 11.5–15.5)
WBC: 3.6 K/uL — ABNORMAL LOW (ref 4.0–10.5)
nRBC: 0 % (ref 0.0–0.2)

## 2024-01-22 LAB — URINALYSIS, ROUTINE W REFLEX MICROSCOPIC
Bilirubin Urine: NEGATIVE
Glucose, UA: 50 mg/dL — AB
Hgb urine dipstick: NEGATIVE
Ketones, ur: NEGATIVE mg/dL
Nitrite: NEGATIVE
Protein, ur: NEGATIVE mg/dL
Specific Gravity, Urine: 1.018 (ref 1.005–1.030)
pH: 5 (ref 5.0–8.0)

## 2024-01-22 LAB — BASIC METABOLIC PANEL WITH GFR
Anion gap: 12 (ref 5–15)
BUN: 28 mg/dL — ABNORMAL HIGH (ref 8–23)
CO2: 21 mmol/L — ABNORMAL LOW (ref 22–32)
Calcium: 8.9 mg/dL (ref 8.9–10.3)
Chloride: 108 mmol/L (ref 98–111)
Creatinine, Ser: 1.2 mg/dL — ABNORMAL HIGH (ref 0.44–1.00)
GFR, Estimated: 43 mL/min — ABNORMAL LOW
Glucose, Bld: 176 mg/dL — ABNORMAL HIGH (ref 70–99)
Potassium: 4.3 mmol/L (ref 3.5–5.1)
Sodium: 141 mmol/L (ref 135–145)

## 2024-01-22 LAB — GLUCOSE, CAPILLARY
Glucose-Capillary: 186 mg/dL — ABNORMAL HIGH (ref 70–99)
Glucose-Capillary: 163 mg/dL — ABNORMAL HIGH (ref 70–99)
Glucose-Capillary: 224 mg/dL — ABNORMAL HIGH (ref 70–99)
Glucose-Capillary: 151 mg/dL — ABNORMAL HIGH (ref 70–99)
Glucose-Capillary: 152 mg/dL — ABNORMAL HIGH (ref 70–99)

## 2024-01-22 LAB — D-DIMER, QUANTITATIVE: D-Dimer, Quant: >20 ug{FEU}/mL — ABNORMAL HIGH (ref 0.00–0.50)

## 2024-01-22 MED ORDER — ONDANSETRON HCL 4 MG/2ML IJ SOLN: 4.0000 mg | Freq: Four times a day (QID) | INTRAMUSCULAR | Status: DC | PRN

## 2024-01-22 MED ORDER — EZETIMIBE 10 MG PO TABS
10.0000 mg | ORAL_TABLET | Freq: Every evening | ORAL | Status: DC
  Administered 2024-01-22: 10 mg via ORAL
  Filled 2024-01-22 (×2): qty 1

## 2024-01-22 MED ORDER — ONDANSETRON HCL 4 MG PO TABS: 4.0000 mg | ORAL_TABLET | Freq: Four times a day (QID) | ORAL | Status: DC | PRN

## 2024-01-22 MED ORDER — ACETAMINOPHEN 650 MG RE SUPP: 650.0000 mg | Freq: Four times a day (QID) | RECTAL | Status: DC | PRN

## 2024-01-22 MED ORDER — SERTRALINE HCL 50 MG PO TABS
25.0000 mg | ORAL_TABLET | Freq: Every day | ORAL | Status: DC
  Administered 2024-01-22 – 2024-01-23 (×2): 25 mg via ORAL
  Filled 2024-01-22 (×2): qty 1

## 2024-01-22 MED ORDER — POLYVINYL ALCOHOL 1.4 % OP SOLN
Freq: Two times a day (BID) | OPHTHALMIC | Status: DC
  Filled 2024-01-22 (×2): qty 15

## 2024-01-22 MED ORDER — INSULIN ASPART 100 UNIT/ML IJ SOLN
0.0000 [IU] | Freq: Three times a day (TID) | INTRAMUSCULAR | Status: DC
  Administered 2024-01-22: 2 [IU] via SUBCUTANEOUS
  Administered 2024-01-22: 3 [IU] via SUBCUTANEOUS
  Administered 2024-01-22: 2 [IU] via SUBCUTANEOUS
  Administered 2024-01-23: 1 [IU] via SUBCUTANEOUS
  Administered 2024-01-23: 3 [IU] via SUBCUTANEOUS
  Filled 2024-01-22: qty 1
  Filled 2024-01-22 (×2): qty 2
  Filled 2024-01-22 (×2): qty 3

## 2024-01-22 MED ORDER — INSULIN ASPART 100 UNIT/ML IJ SOLN: 0.0000 [IU] | Freq: Every day | INTRAMUSCULAR | Status: DC

## 2024-01-22 MED ORDER — ROSUVASTATIN CALCIUM 10 MG PO TABS
10.0000 mg | ORAL_TABLET | Freq: Every day | ORAL | Status: DC
  Administered 2024-01-22 – 2024-01-23 (×2): 10 mg via ORAL
  Filled 2024-01-22 (×3): qty 1

## 2024-01-22 MED ORDER — PANTOPRAZOLE SODIUM 40 MG PO TBEC
40.0000 mg | DELAYED_RELEASE_TABLET | Freq: Every day | ORAL | Status: DC
  Administered 2024-01-22 – 2024-01-23 (×2): 40 mg via ORAL
  Filled 2024-01-22 (×2): qty 1

## 2024-01-22 MED ORDER — INSULIN GLARGINE 100 UNIT/ML ~~LOC~~ SOLN
20.0000 [IU] | Freq: Every day | SUBCUTANEOUS | Status: DC
  Administered 2024-01-23: 20 [IU] via SUBCUTANEOUS
  Filled 2024-01-22: qty 0.2

## 2024-01-22 MED ORDER — LINAGLIPTIN 5 MG PO TABS
5.0000 mg | ORAL_TABLET | Freq: Every day | ORAL | Status: DC
  Administered 2024-01-23: 5 mg via ORAL
  Filled 2024-01-22: qty 1

## 2024-01-22 MED ORDER — INSULIN GLARGINE-YFGN 100 UNIT/ML ~~LOC~~ SOLN
10.0000 [IU] | Freq: Every day | SUBCUTANEOUS | Status: DC
  Administered 2024-01-22: 10 [IU] via SUBCUTANEOUS
  Filled 2024-01-22: qty 0.1

## 2024-01-22 MED ORDER — HYDROCODONE-ACETAMINOPHEN 5-325 MG PO TABS
1.0000 | ORAL_TABLET | Freq: Four times a day (QID) | ORAL | Status: DC | PRN
  Administered 2024-01-22: 1 via ORAL
  Filled 2024-01-22 (×2): qty 1

## 2024-01-22 MED ORDER — SODIUM CHLORIDE 0.9% FLUSH
3.0000 mL | Freq: Two times a day (BID) | INTRAVENOUS | Status: DC
  Administered 2024-01-22 – 2024-01-23 (×4): 3 mL via INTRAVENOUS

## 2024-01-22 MED ORDER — ACETAMINOPHEN 325 MG PO TABS: 650.0000 mg | ORAL_TABLET | Freq: Four times a day (QID) | ORAL | Status: DC | PRN

## 2024-01-22 MED ORDER — ENOXAPARIN SODIUM 30 MG/0.3ML IJ SOSY
30.0000 mg | PREFILLED_SYRINGE | INTRAMUSCULAR | Status: DC
  Administered 2024-01-22 – 2024-01-23 (×2): 30 mg via SUBCUTANEOUS
  Filled 2024-01-22 (×2): qty 0.3

## 2024-01-22 MED ORDER — SODIUM CHLORIDE 0.9 % IV SOLN: INTRAVENOUS | Status: AC

## 2024-01-22 MED ORDER — LATANOPROST 0.005 % OP SOLN
1.0000 [drp] | Freq: Every day | OPHTHALMIC | Status: DC
  Administered 2024-01-22: 1 [drp] via OPHTHALMIC
  Filled 2024-01-22 (×2): qty 2.5

## 2024-01-22 NOTE — Assessment & Plan Note (Addendum)
 No complaints of chest pain and EKG nonacute Will hold metoprolol  until BP stable.  Continue rosuvastatin 

## 2024-01-22 NOTE — Assessment & Plan Note (Signed)
 Continue basal insulin Sliding scale coverage

## 2024-01-22 NOTE — Progress Notes (Signed)
 PHARMACIST - PHYSICIAN COMMUNICATION  CONCERNING:  Enoxaparin  (Lovenox ) for DVT Prophylaxis    RECOMMENDATION: Patient was prescribed enoxaprin 40mg  q24 hours for VTE prophylaxis.   Filed Weights   01/21/24 1630  Weight: 52 kg (114 lb 10.2 oz)    Body mass index is 20.97 kg/m.  Estimated Creatinine Clearance: 19.6 mL/min (A) (by C-G formula based on SCr of 1.54 mg/dL (H)).  Patient is candidate for enoxaparin  30mg  every 24 hours based on CrCl <78ml/min or Weight <45kg  DESCRIPTION: Pharmacy has adjusted enoxaparin  dose per [facility] policy.  Patient is now receiving enoxaparin  30 mg every 24 hours   Rankin CANDIE Dills, PharmD, [facility] 01/22/2024 3:35 AM

## 2024-01-22 NOTE — Progress Notes (Signed)
" °  Brief Progress Note (See full H&P from earlier today)   Subjective: Pt feeling better today Confirms poor po intake, depression w/ death of multiple family members including her children over the course of past few years - she attributes low appetite and low energy to this, has noted weight loss over past year    Objective: Relevant new results:  No DVT Physical Exam:  BP (!) 140/65 (BP Location: Right Arm)   Pulse 70   Temp 98.2 F (36.8 C)   Resp 18   Ht 5' 3 (1.6 m)   Wt 48.7 kg   SpO2 100%   BMI 19.02 kg/m  Constitutional:  General Appearance: alert, well-developed, well-nourished, NAD Respiratory: Normal respiratory effort Breath sounds normal, no wheeze/rhonchi/rales Cardiovascular: S1/S2 normal, no murmur/rub/gallop auscultated No lower extremity edema Gastrointestinal: Nontender, no masses Musculoskeletal:  No clubbing/cyanosis of digits Neurological: No cranial nerve deficit on limited exam Psychiatric: Normal judgment/insight Normal mood and affect   Assessment/Plan changes or updates compared to H&P: Await echo Await PT/OT assessment, may benefit from home health possibly rehab  Holding metoprolol , suspect was overmedicated Mental health - follow outpatient as this is affecting her energy levels, po intake, weight, overall mental/physical wellbeing       No charge note      "

## 2024-01-22 NOTE — Assessment & Plan Note (Signed)
 Continue sertraline 

## 2024-01-22 NOTE — Hospital Course (Signed)
 CAD status post CABG in 2009, hypertension, mild neurocognitive impairment, glaucoma and type 2 diabetes being admitted with dizziness likely secondary to orthostatic hypotension.  Patient recently underwent hip repair for hip fracture, and since the procedure has been having intermittent dizziness.  She denies shortness of breath or pain or swelling in the legs or chest pain.  Denies one-sided weakness numbness or tingling, headache or visual disturbance. In the ED she was tachycardic to 110 with reportedly positive orthostatics Labs notable for baseline anemia of 11.2 creatinine 1.54, up from 1.08 a couple weeks prior with bicarb of 20 and negative respiratory viral panel EKG showed sinus tachycardia at 114 MRI brain showed a remote right occipital cortical infarct but no acute abnormality Patient was treated with an NS bolus Admission requested

## 2024-01-22 NOTE — H&P (Signed)
 " History and Physical    Patient: Laura Ferguson FMW:[id number] DOB: [birth year] DOA: 01/21/2024 DOS: the patient was seen and examined on 01/22/2024 PCP: Gretta Comer POUR, NP  Patient coming from: Home  Chief Complaint:  Chief Complaint  Patient presents with   Hypotension    HPI: Laura Ferguson is a 89 y.o. female with medical history significant for CAD status post CABG in 2009, hypertension, mild neurocognitive impairment, glaucoma, type 2 diabetes, orthostatic hypotension, who underwent hip repair 01/01/2024 being admitted with dizziness, mostly when standing.  Daughter at the bedside who contributes to the history states that she occasionally gets dizzy but since the surgery it has been happening much more frequently.  Overall she is recovering well from her surgery and has been participating with physical therapy and moving around and her pain is well-controlled.  She denies shortness of breath, palpitations or pain or swelling in the legs or chest pain.  Denies one-sided weakness numbness or tingling, headache or visual disturbance. In the ED she was tachycardic to 110 with reportedly positive orthostatics Labs notable for baseline anemia of 11.2 creatinine 1.54, up from 1.08 a couple weeks prior with bicarb of 20 and negative respiratory viral panel EKG showed sinus tachycardia at 114 MRI brain showed a remote right occipital cortical infarct but no acute abnormality Patient was treated with an NS bolus Admission requested      Past Medical History:  Diagnosis Date   Acute bilateral low back pain with right-sided sciatica 03/16/2022   Acute midline thoracic back pain 03/16/2022   Acute pain of left foot 03/09/2019   Anemia    Anxiety    Arthritis    Blood transfusion without reported diagnosis    CAD (coronary artery disease)    Depression    GERD (gastroesophageal reflux disease)    Glaucoma    History of nuclear stress test 11/22/2011   bruce myoview; normal pattern of  perfusio; post-stress EF 76%; low risk scan   Hyperlipidemia    Hypertension    Osteoporosis    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   LIMA to LAD, SVG to ramus intermedius, SVG to OM1 & OM2, SVG to acute marginal, SVG to distal RCA   Stroke (HCC)    Type 2 diabetes mellitus (HCC)    Past Surgical History:  Procedure Laterality Date   CARDIAC CATHETERIZATION  05/07/2007   EF 55%, focal mild hypocontractility in mid-distal inferior wall & mid posterolateral wall; severe multivessel CAD - susequent CABGx6 (Dr. IVAR Sor)   CORONARY ARTERY BYPASS GRAFT  05/09/2007   LIMA to LAD, veing to intermediate; SVG to OM1 & OM2; SVG to acute marginal & distal RCA (Dr. Kerrin)   FRACTURE SURGERY     HIP SURGERY Right 12/31/2023   INTRAMEDULLARY (IM) NAIL INTERTROCHANTERIC Right 01/01/2024   Procedure: FIXATION, FRACTURE, INTERTROCHANTERIC, WITH INTRAMEDULLARY ROD;  Surgeon: Lorelle Hussar, MD;  Location: [facility] ORS;  Service: Orthopedics;  Laterality: Right;   TRANSTHORACIC ECHOCARDIOGRAM  02/13/2010   EF =>55%, vigorous contraction EF 65%; LA mild-mod dilated; IV normal diameter - normal CVP; trace MR; mild TR; trace AV regurg   Social History:  reports that she has never smoked. She has never used smokeless tobacco. She reports that she does not drink alcohol  and does not use drugs.  Allergies[1]  Family History  Problem Relation Age of Onset   Cancer Mother    Diabetes Sister    Cancer Child     Prior to Admission medications  Medication Sig Start Date End Date Taking? Authorizing Provider  acetaminophen  (TYLENOL ) 325 MG tablet Take 2 tablets (650 mg total) by mouth every 6 (six) hours. 01/06/24  Yes Josette Ade, MD  Cholecalciferol  (VITAMIN D ) 50 MCG (2000 UT) CAPS Take 1 capsule (2,000 Units total) by mouth daily at 12 noon. 01/03/24  Yes Clark, Katherine K, NP  ezetimibe  (ZETIA ) 10 MG tablet Take 1 tablet (10 mg total) by mouth every evening. 01/03/24  Yes End, Lonni, MD   HYDROcodone -acetaminophen  (NORCO/VICODIN) 5-325 MG tablet Take 1 tablet by mouth every 6 (six) hours as needed for severe pain (pain score 7-10). 01/06/24  Yes Wieting, Richard, MD  Insulin  Pen Needle (BD PEN NEEDLE NANO 2ND GEN) 32G X 4 MM MISC USE 1 PEN NEEDLE WITH INSULIN  PEN once daily 10/05/22  Yes Clark, Katherine K, NP  LANTUS  SOLOSTAR 100 UNIT/ML Solostar Pen Inject 20 Units into the skin daily. 01/06/24  Yes Wieting, Richard, MD  linagliptin  (TRADJENTA ) 5 MG TABS tablet Take 1 tablet (5 mg total) by mouth daily at 12 noon. for diabetes. 01/03/24  Yes Clark, Katherine K, NP  metoprolol  succinate (TOPROL -XL) 25 MG 24 hr tablet Take 1 tablet (25 mg total) by mouth daily. Take with or immediately following a meal. 01/06/24  Yes Wieting, Richard, MD  Multiple Vitamin (MULTIVITAMIN) capsule Take 1 capsule by mouth daily.   Yes [provider]  pantoprazole  (PROTONIX ) 40 MG tablet Take 1 tablet (40 mg total) by mouth in the morning. 01/03/24  Yes End, Lonni, MD  Polyethyl Glycol-Propyl Glycol (SYSTANE OP) Apply 2 drops to eye 2 (two) times daily.    Yes [provider]  rosuvastatin  (CRESTOR ) 10 MG tablet Take 1 tablet (10 mg total) by mouth daily at 12 noon. 01/03/24  Yes End, Lonni, MD  sertraline  (ZOLOFT ) 25 MG tablet Take 1 tablet (25 mg total) by mouth daily at 12 noon. for anxiety and depression. 01/03/24  Yes Clark, Katherine K, NP  TRAVATAN Z 0.004 % SOLN ophthalmic solution Place 1 drop into both eyes at bedtime. 10/14/14  Yes [provider]  Blood Glucose Monitoring Suppl (ACCU-CHEK GUIDE) w/Device KIT Use as instructed to check blood sugar. 06/06/21   Trixie File, MD  Continuous Glucose Receiver (FREESTYLE LIBRE 3 READER) DEVI Use to check blood sugar continuously (broken reader, try for lost/damaged med override). E11.65, Z79.4 10/18/23   Wendee Lynwood HERO, NP  Continuous Glucose Sensor (FREESTYLE LIBRE 3 PLUS SENSOR) MISC Change sensor every 15  days. 06/28/23   Clark, Katherine K, NP  Continuous Glucose Sensor (FREESTYLE LIBRE 3 SENSOR) MISC USE ONE SENSOR EVERY 14 DAYS TO CONTINOUSLY MONITOR GLUCOSE 12/04/23   Gretta Comer POUR, NP  enoxaparin  (LOVENOX ) 30 MG/0.3ML injection Inject 0.3 mLs (30 mg total) into the skin daily. 01/06/24 01/20/24  Josette Ade, MD  glucose blood (ACCU-CHEK GUIDE) test strip USE TO CHECK SUGAR THREE TIMES DAILY AS DIRECTED 12/27/20   Trixie File, MD    Physical Exam: Vitals:   01/21/24 1629 01/21/24 1630 01/21/24 1932 01/21/24 2302  BP: 106/79  133/72 117/73  Pulse: (!) 110  71 74  Resp: 18  18 18   Temp: 98 F (36.7 C)  98.1 F (36.7 C) 98.2 F (36.8 C)  TempSrc: Oral  Oral Oral  SpO2: 99%  100% 100%  Weight:  52 kg    Height:  5' 2 (1.575 m)     Physical Exam Vitals and nursing note reviewed.  Constitutional:  General: She is not in acute distress. HENT:     Head: Normocephalic and atraumatic.  Cardiovascular:     Rate and Rhythm: Normal rate and regular rhythm.     Heart sounds: Normal heart sounds.  Pulmonary:     Effort: Pulmonary effort is normal.     Breath sounds: Normal breath sounds.  Abdominal:     Palpations: Abdomen is soft.     Tenderness: There is no abdominal tenderness.  Musculoskeletal:     Comments: Differential tenderness on palpating right calf  Neurological:     Mental Status: Mental status is at baseline.     Labs on Admission: I have personally reviewed following labs and imaging studies  CBC: Recent Labs  Lab 01/21/24 1629  WBC 4.5  HGB 11.2*  HCT 34.7*  MCV 94.3  PLT 191   Basic Metabolic Panel: Recent Labs  Lab 01/21/24 1629  NA 140  K 4.2  CL 105  CO2 20*  GLUCOSE 181*  BUN 31*  CREATININE 1.54*  CALCIUM  9.5   GFR: Estimated Creatinine Clearance: 19.6 mL/min (A) (by C-G formula based on SCr of 1.54 mg/dL (H)). Liver Function Tests: Recent Labs  Lab 01/21/24 1629  AST 24  ALT 25  ALKPHOS 134*  BILITOT 0.7  PROT  7.1  ALBUMIN 3.9   No results for input(s): LIPASE, AMYLASE in the last 168 hours. No results for input(s): AMMONIA in the last 168 hours. Coagulation Profile: No results for input(s): INR, PROTIME in the last 168 hours. Cardiac Enzymes: No results for input(s): CKTOTAL, CKMB, CKMBINDEX, TROPONINI in the last 168 hours. BNP (last 3 results) No results for input(s): PROBNP in the last 8760 hours. HbA1C: No results for input(s): HGBA1C in the last 72 hours. CBG: No results for input(s): GLUCAP in the last 168 hours. Lipid Profile: No results for input(s): CHOL, HDL, LDLCALC, TRIG, CHOLHDL, LDLDIRECT in the last 72 hours. Thyroid  Function Tests: No results for input(s): TSH, T4TOTAL, FREET4, T3FREE, THYROIDAB in the last 72 hours. Anemia Panel: No results for input(s): VITAMINB12, FOLATE, FERRITIN, TIBC, IRON, RETICCTPCT in the last 72 hours. Urine analysis:    Component Value Date/Time   COLORURINE YELLOW (A) 01/01/2024 0706   APPEARANCEUR HAZY (A) 01/01/2024 0706   LABSPEC 1.015 01/01/2024 0706   PHURINE 5.0 01/01/2024 0706   GLUCOSEU >=500 (A) 01/01/2024 0706   HGBUR NEGATIVE 01/01/2024 0706   BILIRUBINUR Negative 01/13/2024 1413   KETONESUR NEGATIVE 01/01/2024 0706   PROTEINUR Negative 01/13/2024 1413   PROTEINUR 30 (A) 01/01/2024 0706   UROBILINOGEN 0.2 01/13/2024 1413   UROBILINOGEN 1.0 05/08/2007 0651   NITRITE Negative 01/13/2024 1413   NITRITE NEGATIVE 01/01/2024 0706   LEUKOCYTESUR Negative 01/13/2024 1413   LEUKOCYTESUR NEGATIVE 01/01/2024 0706    Radiological Exams on Admission: MR BRAIN WO CONTRAST Result Date: 01/21/2024 EXAM: MRI Brain Without Contrast 01/21/2024 10:27:03 PM TECHNIQUE: Multiplanar multisequence MRI of the head/brain was performed without the administration of intravenous contrast. COMPARISON: CT head 12/31/2023 CLINICAL HISTORY: Mental status change, unknown cause FINDINGS: BRAIN AND  VENTRICLES: No acute infarct. Remote right occipital cortical infarct. Mild scattered T2 hyperintensities, compatible with chronic microvascular ischemic change. No intracranial hemorrhage. No mass. No midline shift. No hydrocephalus. The sella is unremarkable. Normal flow voids. ORBITS: No acute abnormality. SINUSES AND MASTOIDS: No acute abnormality. BONES AND SOFT TISSUES: Normal marrow signal. No acute soft tissue abnormality. IMPRESSION: 1. No acute intracranial abnormality. 2. Remote right occipital cortical infarct. Electronically signed by: Gilmore Molt 01/21/2024  10:44 PM EST RP Workstation: [id number]   Data Reviewed for HPI: Relevant notes from primary care and specialist visits, past discharge summaries as available in EHR, including Care Everywhere. Prior diagnostic testing as pertinent to current admission diagnoses Updated medications and problem lists for reconciliation ED course, including vitals, labs, imaging, treatment and response to treatment Triage notes, nursing and pharmacy notes and ED provider's notes Notable results as noted above in HPI      Assessment and Plan: * Postural dizziness with presyncope History of orthostatic hypotension Suspect related to orthostasis in the setting of dehydration-positive orthostatics in the ED Given recent hip fracture surgery will get a D-dimer and if elevated we will get a CTA chest to rule out PE IV hydration Fall precautions Telemetry and echocardiogram Addendum: D-dimer> 20.  Discussed the possibility of DVT/PE with patient and daughter at bedside.  They are hesitant to get a CTA chest which is reasonable given that she denies shortness of breath and is not hypoxic and her tachycardia on arrival was fluid responsive.  They have agreed to a lower extremity venous ultrasound given some discomfort on palpation of the right calf -Follow-up venous ultrasound right leg  Acute renal failure superimposed on stage 3a chronic kidney  disease (HCC) creatinine 1.54, up from 1.08 a couple weeks prior with bicarb of 20 Expecting improvement with IV fluids Monitor renal function and avoid nephrotoxins  Essential hypertension Holding home antihypertensives of metoprolol   CAD S/P CABG No complaints of chest pain and EKG nonacute Will hold metoprolol  until BP stable.  Continue rosuvastatin   Type 2 diabetes mellitus with retinopathy (HCC) Continue basal insulin  Sliding scale coverage  Anxiety and depression Continue sertraline     DVT prophylaxis: Lovenox   Consults: none  Advance Care Planning:   Code Status: Prior   Family Communication: Daughter at bedside  Disposition Plan: Back to previous home environment  Severity of Illness: The appropriate patient status for this patient is OBSERVATION. Observation status is judged to be reasonable and necessary in order to provide the required intensity of service to ensure the patient's safety. The patient's presenting symptoms, physical exam findings, and initial radiographic and laboratory data in the context of their medical condition is felt to place them at decreased risk for further clinical deterioration. Furthermore, it is anticipated that the patient will be medically stable for discharge from the hospital within 2 midnights of admission.   Author: Delayne LULLA Solian, MD 01/22/2024 1:25 AM  For on call review [email or web address].      [1] No Known Allergies  "

## 2024-01-22 NOTE — Assessment & Plan Note (Addendum)
 creatinine 1.54, up from 1.08 a couple weeks prior with bicarb of 20 Expecting improvement with IV fluids Monitor renal function and avoid nephrotoxins

## 2024-01-22 NOTE — Assessment & Plan Note (Signed)
 Holding home antihypertensives of metoprolol 

## 2024-01-22 NOTE — ED Notes (Signed)
 Patient is aware of shared room and is agreeable

## 2024-01-22 NOTE — Assessment & Plan Note (Addendum)
 History of orthostatic hypotension Suspect related to orthostasis in the setting of dehydration-positive orthostatics in the ED Given recent hip fracture surgery will get a D-dimer and if elevated we will get a CTA chest to rule out PE IV hydration Fall precautions Telemetry and echocardiogram Addendum: D-dimer> 20.  Discussed the possibility of DVT/PE with patient and daughter at bedside.  They are hesitant to get a CTA chest which is reasonable given that she denies shortness of breath and is not hypoxic and her tachycardia on arrival was fluid responsive.  They have agreed to a lower extremity venous ultrasound given some discomfort on palpation of the right calf -Follow-up venous ultrasound right leg

## 2024-01-23 ENCOUNTER — Observation Stay
Admit: 2024-01-23 | Discharge: 2024-01-23 | Disposition: A | Discharge status: Home / Self Care | Attending: Internal Medicine | Admitting: Internal Medicine

## 2024-01-23 ENCOUNTER — Ambulatory Visit: Payer: Medicare HMO

## 2024-01-23 DIAGNOSIS — R55 Syncope and collapse: Secondary | ICD-10-CM | POA: Diagnosis not present

## 2024-01-23 DIAGNOSIS — R42 Dizziness and giddiness: Secondary | ICD-10-CM | POA: Diagnosis not present

## 2024-01-23 LAB — ECHOCARDIOGRAM COMPLETE
AR max vel: 2.83 cm2
AV Area VTI: 3.12 cm2
AV Area mean vel: 2.62 cm2
AV Mean grad: 2.5 mmHg
AV Peak grad: 4.2 mmHg
Ao pk vel: 1.03 m/s
Area-P 1/2: 4.99 cm2
Height: 63 in
MV VTI: 2 cm2
S' Lateral: 2.6 cm
Weight: 1717.82 [oz_av]

## 2024-01-23 LAB — GLUCOSE, CAPILLARY
Glucose-Capillary: 128 mg/dL — ABNORMAL HIGH (ref 70–99)
Glucose-Capillary: 213 mg/dL — ABNORMAL HIGH (ref 70–99)

## 2024-01-23 NOTE — Care Management Obs Status (Signed)
 MEDICARE OBSERVATION STATUS NOTIFICATION   Patient Details  Name: Laura Ferguson MRN: [id number] Date of Birth: 1932-09-03   Medicare Observation Status Notification Given:  Chaney BRANDY CHRISTIANE LELON, CMA 01/23/2024, 12:00 PM

## 2024-01-23 NOTE — Progress Notes (Signed)
*  PRELIMINARY RESULTS* Echocardiogram 2D Echocardiogram has been performed.  Laura Ferguson 01/23/2024, 10:55 AM

## 2024-01-23 NOTE — Evaluation (Signed)
 Occupational Therapy Evaluation Patient Details Name: Laura Ferguson MRN: [id number] DOB: 03/29/1932 Today's Date: 01/23/2024   History of Present Illness   Laura Ferguson is a 89 y.o. female with medical history significant for CAD status post CABG in 2009, hypertension, mild neurocognitive impairment, glaucoma, type 2 diabetes, orthostatic hypotension, who underwent hip repair 01/01/2024 being admitted with dizziness, mostly when standing.     Clinical Impressions Laura Ferguson presents with generalized weakness, limited endurance, impaired balance, and dizziness. Prior to a fall/R hip fx in Dec 2025, pt had been IND. Since then, her 2 daughters have been living with her and assisting in her care. Pt has continued to require Min-Mod A for OOB mobility. During today's session she is able to perform bed mobility, transfers, short-distance ambulation with SUPV-CGA. She endorses dizziness in standing and tires quickly. Made multiple attempts of sit-stand-ambulate, with pt improving with each attempt. Provided educ to pt and daughter on home care, with emphasis on gradual increase on a daily basis of pt's OOB time, to support her rehab process. Recommend ongoing OT while hospitalized. Pt has been receiving HHOT and PT since her hospital DC in Dec 2025; recommend continuing with that care post Jan 2026 DC.     If plan is discharge home, recommend the following:   A little help with walking and/or transfers;A little help with bathing/dressing/bathroom;Assistance with cooking/housework;Assist for transportation;Help with stairs or ramp for entrance     Functional Status Assessment   Patient has had a recent decline in their functional status and demonstrates the ability to make significant improvements in function in a reasonable and predictable amount of time.     Equipment Recommendations   None recommended by OT     Recommendations for Other Services         Precautions/Restrictions    Precautions Precautions: Fall Restrictions Weight Bearing Restrictions Per Provider Order: No     Mobility Bed Mobility Overal bed mobility: Needs Assistance Bed Mobility: Supine to Sit     Supine to sit: Supervision          Transfers Overall transfer level: Needs assistance Equipment used: Rolling walker (2 wheels) Transfers: Sit to/from Stand Sit to Stand: Contact guard assist                  Balance Overall balance assessment: Needs assistance Sitting-balance support: Feet supported, Bilateral upper extremity supported Sitting balance-Leahy Scale: Good     Standing balance support: Bilateral upper extremity supported, Reliant on assistive device for balance Standing balance-Leahy Scale: Fair Standing balance comment: pt endorses dizziness in standing, requires close SUPV for safety                           ADL either performed or assessed with clinical judgement   ADL Overall ADL's : Needs assistance/impaired                                             Vision         Perception         Praxis         Pertinent Vitals/Pain Pain Assessment Pain Assessment: 0-10 Pain Score: 4  Pain Location: R hip and R knee with standing Pain Descriptors / Indicators: Aching, Discomfort Pain Intervention(s): Repositioned, Monitored during session     Extremity/Trunk  Assessment Upper Extremity Assessment Upper Extremity Assessment: Generalized weakness   Lower Extremity Assessment Lower Extremity Assessment: Generalized weakness       Communication Communication Communication: No apparent difficulties   Cognition Arousal: Alert Behavior During Therapy: WFL for tasks assessed/performed                                 Following commands: Intact       Cueing  General Comments          Exercises Other Exercises Other Exercises: Provided pt and family educ in compensatory strategies to support  ADL/mobility participation and safety upon return home.   Shoulder Instructions      Home Living Family/patient expects to be discharged to:: Private residence Living Arrangements: Children Available Help at Discharge: Family Type of Home: House Home Access: Stairs to enter Secretary/administrator of Steps: 3 Entrance Stairs-Rails: Can reach both;Left;Right Home Layout: One level     Bathroom Shower/Tub: Chief Strategy Officer: Standard     Home Equipment: Shower seat;Grab bars - tub/shower;Hand held shower head          Prior Functioning/Environment Prior Level of Function : Needs assist             Mobility Comments: since R hip fx 01/01/2024, pt has had limited mobility, ambulating short distances only w/ RW ADLs Comments: has required assistance for BADLs since 12/2023 fx and subsequent sx    OT Problem List: Decreased strength;Decreased activity tolerance;Impaired balance (sitting and/or standing)   OT Treatment/Interventions: Self-care/ADL training;Therapeutic exercise;Patient/family education;Balance training;Neuromuscular education;Energy conservation;Therapeutic activities;DME and/or AE instruction      OT Goals(Current goals can be found in the care plan section)   Acute Rehab OT Goals Patient Stated Goal: to get stronger OT Goal Formulation: With patient/family Time For Goal Achievement: 02/06/24 Potential to Achieve Goals: Good ADL Goals Pt Will Perform Grooming: with supervision;standing (standing at sink 5+ minutes without dizziness or LOB) Pt Will Perform Lower Body Dressing: sitting/lateral leans;with min assist Pt Will Transfer to Toilet: with supervision;ambulating   OT Frequency:  Min 2X/week    Co-evaluation              AM-PAC OT 6 Clicks Daily Activity     Outcome Measure Help from another person eating meals?: None Help from another person taking care of personal grooming?: A Little Help from another person  toileting, which includes using toliet, bedpan, or urinal?: A Lot Help from another person bathing (including washing, rinsing, drying)?: A Lot Help from another person to put on and taking off regular upper body clothing?: A Little Help from another person to put on and taking off regular lower body clothing?: A Little 6 Click Score: 17   End of Session Equipment Utilized During Treatment: Rolling walker (2 wheels)  Activity Tolerance: Patient tolerated treatment well Patient left: in bed;with call bell/phone within reach;with family/visitor present (sitting EOB)  OT Visit Diagnosis: Unsteadiness on feet (R26.81);Muscle weakness (generalized) (M62.81)                Time: 9143-9067 OT Time Calculation (min): 36 min Charges:  OT General Charges $OT Visit: 1 Visit OT Evaluation $OT Eval Moderate Complexity: 1 Mod OT Treatments $Self Care/Home Management : 23-37 mins Suzen Hock, PhD, MS, OTR/L 01/23/2024, 1:28 PM

## 2024-01-23 NOTE — Telephone Encounter (Signed)
 Noted

## 2024-01-23 NOTE — Hospital Course (Signed)
 Hospital course / significant events:   HPI: ***  01/07: 01/08: Echo EF 50-55, grade 1 diast dysfunction, no significant valve abnormalities, mild septal hypokinesis but no regional wall motion abn otherwise. Pt reports feeling better today.      Consultants:  none  Procedures/Surgeries: none      ASSESSMENT & PLAN:   Postural dizziness with presyncope (+)orthostatic hypotension - likely related to overmedication w/ beta blocker, complicated by dehydration/malnutrition and weight loss over past year (see below)  Hx Essential hypertension Hold/DC metoprolol  No concerns on echo  PT/OT ***    Acute renal failure superimposed on stage 3a chronic kidney disease - resolved w/ hydration Follow outpatient    CAD S/P CABG No complaints of chest pain and EKG nonacute hold metoprolol  as above  Continue rosuvastatin    Type 2 diabetes mellitus with retinopathy  Continue basal insulin  Sliding scale coverage Can resume home meds    Anxiety and depression Continue sertraline  Pt notes depression given several deaths in the family, attributes her weight loss / appetite decrease to this    Borderline underweight based on BMI: Body mass index is 19.02 kg/m.SABRA Significantly low or high BMI is associated with higher medical risk.  Underweight - under 18  overweight - 25 to 29 obese - 30 or more Class 1 obesity: BMI of 30.0 to 34 Class 2 obesity: BMI of 35.0 to 39 Class 3 obesity: BMI of 40.0 to 49 Super Morbid Obesity: BMI 50-59 Super-super Morbid Obesity: BMI 60+ Healthy nutrition and physical activity advised as adjunct to other disease management and risk reduction treatments    DVT prophylaxis: *** IV fluids: *** continuous IV fluids  Nutrition: *** Central lines / other devices: ***  Code Status: *** ACP documentation reviewed: *** none on file in [facility]  TOC needs: *** Medical barriers to dispo: ***. Expected medical readiness for discharge ***.

## 2024-01-23 NOTE — Evaluation (Signed)
 Physical Therapy Evaluation Patient Details Name: Laura Ferguson MRN: [id number] DOB: 1932/02/27 Today's Date: 01/23/2024  History of Present Illness  Laura Ferguson is a 89 y.o. female with medical history significant for CAD status post CABG in 2009, hypertension, mild neurocognitive impairment, glaucoma, type 2 diabetes, orthostatic hypotension, who underwent hip repair 01/01/2024 being admitted with dizziness, mostly when standing.   Clinical Impression  Patient alert, oriented to self, place, family in room. Since last admission pt has somewhat ambulatory; able to walk several steps to [facility] with family assist, but has been limited in mobility due to dizziness, uses a bedpan as well. BP assessed and pt is orthostatic, RN/MD notified (77/54 in sitting, improved to 91/64 with seated movements). Sit <> stand with minA and RW. Pt need min-modA for a few steps forwards/backwards including maxA for a LOB posteriorly. Returned to supine with needs in reach.  Overall the patient demonstrated deficits (see PT Problem List) that impede the patient's functional abilities, safety, and mobility and would benefit from skilled PT intervention.          If plan is discharge home, recommend the following: Help with stairs or ramp for entrance;Two people to help with walking and/or transfers;Two people to help with bathing/dressing/bathroom;Assistance with cooking/housework;Assist for transportation   Can travel by private vehicle   Yes    Equipment Recommendations None recommended by PT  Recommendations for Other Services       Functional Status Assessment Patient has had a recent decline in their functional status and demonstrates the ability to make significant improvements in function in a reasonable and predictable amount of time.     Precautions / Restrictions Precautions Precautions: Fall Recall of Precautions/Restrictions: Intact Restrictions Weight Bearing Restrictions Per Provider Order:  Yes RLE Weight Bearing Per Provider Order: Weight bearing as tolerated      Mobility  Bed Mobility Overal bed mobility: Needs Assistance Bed Mobility: Supine to Sit, Sit to Supine     Supine to sit: Supervision Sit to supine: Min assist        Transfers Overall transfer level: Needs assistance Equipment used: Rolling walker (2 wheels) Transfers: Sit to/from Stand Sit to Stand: Min assist                Ambulation/Gait Ambulation/Gait assistance: Min assist, Mod assist Gait Distance (Feet): 2 Feet Assistive device: Rolling walker (2 wheels)         General Gait Details: min-modA especially with a few steps backwards to EOB, 1 LOB requiring maxA to correct  Stairs            Wheelchair Mobility     Tilt Bed    Modified Rankin (Stroke Patients Only)       Balance Overall balance assessment: Needs assistance Sitting-balance support: Feet supported, Bilateral upper extremity supported Sitting balance-Leahy Scale: Fair     Standing balance support: Bilateral upper extremity supported, Reliant on assistive device for balance Standing balance-Leahy Scale: Poor                               Pertinent Vitals/Pain Pain Assessment Pain Assessment: 0-10 Faces Pain Scale: Hurts little more Pain Location: R hip and R knee with standing Pain Descriptors / Indicators: Aching, Discomfort Pain Intervention(s): Limited activity within patient's tolerance, Monitored during session    Home Living Family/patient expects to be discharged to:: Private residence Living Arrangements: Children Available Help at Discharge: Family Type of  Home: House Home Access: Stairs to enter Entrance Stairs-Rails: Can reach both;Left;Right Entrance Stairs-Number of Steps: 3   Home Layout: One level Home Equipment: Shower seat;Grab bars - tub/shower;Hand held shower head      Prior Function Prior Level of Function : Needs assist             Mobility  Comments: since R hip fx 01/01/2024, pt has had limited mobility, ambulating short distances only w/ RW ADLs Comments: has required assistance for BADLs since 12/2023 fx and subsequent sx     Extremity/Trunk Assessment   Upper Extremity Assessment Upper Extremity Assessment: Generalized weakness    Lower Extremity Assessment Lower Extremity Assessment: Generalized weakness RLE Deficits / Details: noted for some R leg internal rotation, pt stated thats been going on for a little bit now just reported knee pain. able to obatin neutral with PROM       Communication   Communication Communication: No apparent difficulties    Cognition Arousal: Alert Behavior During Therapy: WFL for tasks assessed/performed   PT - Cognitive impairments: History of cognitive impairments                         Following commands: Intact       Cueing Cueing Techniques: Verbal cues, Tactile cues     General Comments      Exercises     Assessment/Plan    PT Assessment Patient needs continued PT services  PT Problem List Decreased strength;Decreased activity tolerance;Decreased mobility       PT Treatment Interventions DME instruction;Stair training;Functional mobility training;Therapeutic activities;Therapeutic exercise;Neuromuscular re-education;Balance training;Patient/family education    PT Goals (Current goals can be found in the Care Plan section)  Acute Rehab PT Goals Patient Stated Goal: to get better PT Goal Formulation: With patient Time For Goal Achievement: 02/06/24 Potential to Achieve Goals: Fair    Frequency Min 2X/week     Co-evaluation               AM-PAC PT 6 Clicks Mobility  Outcome Measure Help needed turning from your back to your side while in a flat bed without using bedrails?: A Lot Help needed moving from lying on your back to sitting on the side of a flat bed without using bedrails?: A Lot Help needed moving to and from a bed to a  chair (including a wheelchair)?: A Lot Help needed standing up from a chair using your arms (e.g., wheelchair or bedside chair)?: A Lot Help needed to walk in hospital room?: A Lot Help needed climbing 3-5 steps with a railing? : Total 6 Click Score: 11    End of Session   Activity Tolerance: Patient tolerated treatment well;Other (comment) (limited by BP) Patient left: with call bell/phone within reach;in bed;with bed alarm set Nurse Communication: Mobility status PT Visit Diagnosis: Other abnormalities of gait and mobility (R26.89)    Time: 8677-8648 PT Time Calculation (min) (ACUTE ONLY): 29 min   Charges:   PT Evaluation $PT Eval Low Complexity: 1 Low PT Treatments $Therapeutic Activity: 23-37 mins PT General Charges $$ ACUTE PT VISIT: 1 Visit        Doyal Shams PT, DPT 3:43 PM,01/23/2024

## 2024-01-23 NOTE — Telephone Encounter (Signed)
 Daughter needing to reschedule appt for tomorrow. Pt to be dc'd from hospital today.

## 2024-01-23 NOTE — Discharge Summary (Signed)
 "    Physician Discharge Summary   Patient: Laura Ferguson MRN: [id number]  DOB: Oct 03, 1932   Admit:     Date of Admission: 01/21/2024 Admitted from: home   Discharge: Date of discharge: 01/23/2024 Disposition: Home with home health  Condition at discharge: good  CODE STATUS: FULL CODE     Discharge Physician: Laneta Blunt, DO [facility]     PCP: Gretta Comer POUR, NP  Recommendations for Outpatient Follow-up:  Follow up with PCP Gretta Comer POUR, NP in 1-2 weeks    Discharge Instructions     Diet general   Complete by: As directed    Increase activity slowly   Complete by: As directed    No wound care   Complete by: As directed          Discharge Diagnoses: Principal Problem:   Postural dizziness with presyncope Active Problems:   Orthostatic hypotension   Acute renal failure superimposed on stage 3a chronic kidney disease (HCC)   CAD S/P CABG   Essential hypertension   Anxiety and depression   Type 2 diabetes mellitus with retinopathy [facility])        Hospital course / significant events:   Laura Ferguson is a 89 y.o. female with medical history significant for CAD status post CABG in 2009, hypertension, mild neurocognitive impairment, glaucoma, type 2 diabetes, orthostatic hypotension, who underwent hip repair 01/01/2024 being admitted with dizziness, mostly when standing.    Daughter states that she occasionally gets dizzy but since the surgery it has been happening much more frequently.  Overall she is recovering well from her surgery and has been participating with physical therapy and moving around and her pain is well-controlled.  She denies shortness of breath, palpitations or pain or swelling in the legs or chest pain.  Denies one-sided weakness numbness or tingling, headache or visual disturbance. Notes (+)orthostatics and AKI. 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 Patient was treated with an  NS bolus Admission requested to hospitalist. Pt stable, remains w/ (+)orthostatics, metoprolol  held and BP up some on hospital day 2, AKI resolved, pt reports improvement. Echo EF 50-55, grade 1 diast dysfunction, no significant valve abnormalities, mild septal hypokinesis but no regional wall motion abn otherwise. Pt reports feeling better on day of discharge. Plan dc home and rsume home health.      Consultants:  none  Procedures/Surgeries: none      ASSESSMENT & PLAN:   Postural dizziness with presyncope (+)orthostatic hypotension - likely related to overmedication w/ beta blocker, complicated by dehydration/malnutrition and weight loss over past year (see below)  Hx Essential hypertension Hold/DC metoprolol  No concerns on echo  PT/OT no new DME recs, resume home health     Acute renal failure superimposed on stage 3a chronic kidney disease - resolved w/ hydration Follow outpatient    CAD S/P CABG No complaints of chest pain and EKG nonacute hold metoprolol  as above  Continue rosuvastatin    Type 2 diabetes mellitus with retinopathy  Continue basal insulin  Sliding scale coverage Can resume home meds    Anxiety and depression Continue sertraline  Pt notes depression given several deaths in the family, attributes her weight loss / appetite decrease to this    Borderline underweight based on BMI: Body mass index is 19.02 kg/m.SABRA Significantly low or high BMI is associated with higher medical risk.  Underweight - under 18  overweight - 25 to 29 obese - 30 or more Class 1  obesity: BMI of 30.0 to 34 Class 2 obesity: BMI of 35.0 to 39 Class 3 obesity: BMI of 40.0 to 49 Super Morbid Obesity: BMI 50-59 Super-super Morbid Obesity: BMI 60+ Healthy nutrition and physical activity advised as adjunct to other disease management and risk reduction treatments             Discharge Instructions  Allergies as of 01/23/2024   No Known Allergies      Medication  List     STOP taking these medications    enoxaparin  30 MG/0.3ML injection Commonly known as: LOVENOX    metoprolol  succinate 25 MG 24 hr tablet Commonly known as: TOPROL -XL       TAKE these medications    Accu-Chek Guide test strip Generic drug: glucose blood USE TO CHECK SUGAR THREE TIMES DAILY AS DIRECTED   Accu-Chek Guide w/Device Kit Use as instructed to check blood sugar.   acetaminophen  325 MG tablet Commonly known as: TYLENOL  Take 2 tablets (650 mg total) by mouth every 6 (six) hours.   BD Pen Needle Nano 2nd Gen 32G X 4 MM Misc Generic drug: Insulin  Pen Needle USE 1 PEN NEEDLE WITH INSULIN  PEN once daily   ezetimibe  10 MG tablet Commonly known as: ZETIA  Take 1 tablet (10 mg total) by mouth every evening.   FreeStyle Libre 3 Plus Sensor Misc Change sensor every 15 days.   FreeStyle Libre 3 Sensor Misc USE ONE SENSOR EVERY 14 DAYS TO CONTINOUSLY MONITOR GLUCOSE   FreeStyle Libre 3 Reader Devi Use to check blood sugar continuously (broken reader, try for lost/damaged med override). E11.65, Z79.4   HYDROcodone -acetaminophen  5-325 MG tablet Commonly known as: NORCO/VICODIN Take 1 tablet by mouth every 6 (six) hours as needed for severe pain (pain score 7-10).   Lantus  SoloStar 100 UNIT/ML Solostar Pen Generic drug: insulin  glargine Inject 20 Units into the skin daily.   linagliptin  5 MG Tabs tablet Commonly known as: Tradjenta  Take 1 tablet (5 mg total) by mouth daily at 12 noon. for diabetes.   multivitamin capsule Take 1 capsule by mouth daily.   pantoprazole  40 MG tablet Commonly known as: PROTONIX  Take 1 tablet (40 mg total) by mouth in the morning.   rosuvastatin  10 MG tablet Commonly known as: CRESTOR  Take 1 tablet (10 mg total) by mouth daily at 12 noon.   sertraline  25 MG tablet Commonly known as: ZOLOFT  Take 1 tablet (25 mg total) by mouth daily at 12 noon. for anxiety and depression.   SYSTANE OP Apply 2 drops to eye 2 (two) times  daily.   Travatan Z 0.004 % Soln ophthalmic solution Generic drug: Travoprost (BAK Free) Place 1 drop into both eyes at bedtime.   Vitamin D  50 MCG (2000 UT) Caps Take 1 capsule (2,000 Units total) by mouth daily at 12 noon.         Follow-up Information     Schedule an appointment as soon as possible for a visit  with Gretta Comer POUR, NP.   Specialty: Internal Medicine Contact information: [address] [place] [address] [phone]         [facility] Emergency Department at [facility].   Specialty: Emergency Medicine Why: If symptoms worsen Contact information: [address] [place]  [address] [phone]                Allergies[1]   Subjective: pt reports feeling stronger today, more energy, still some orthostatic dizziness but improved. No chest pain, no SOB,  tolerating diet, no issues w/ urinarion/defecation   Discharge Exam: BP 100/64   Pulse 94   Temp 98.5 F (36.9 C)   Resp 18   Ht 5' 3 (1.6 m)   Wt 48.7 kg   SpO2 100%   BMI 19.02 kg/m  General: Pt is alert, awake, not in acute distress Cardiovascular: RRR, S1/S2 +, no rubs, no gallops Respiratory: CTA bilaterally, no wheezing, no rhonchi Abdominal: Soft, NT, ND, bowel sounds + Extremities: no edema, no cyanosis     The results of significant diagnostics from this hospitalization (including imaging, microbiology, ancillary and laboratory) are listed below for reference.     Microbiology: Recent Results (from the past 240 hours)  Resp panel by RT-PCR (RSV, Flu A&B, Covid) Anterior Nasal Swab     Status: None   Collection Time: 01/21/24  8:01 PM   Specimen: Anterior Nasal Swab  Result Value Ref Range Status   SARS Coronavirus 2 by RT PCR NEGATIVE NEGATIVE Final    Comment: (NOTE) SARS-CoV-2 target nucleic acids are NOT DETECTED.  The SARS-CoV-2 RNA is generally detectable in upper respiratory specimens during the acute phase of infection.  The lowest concentration of SARS-CoV-2 viral copies this assay can detect is 138 copies/mL. A negative result does not preclude SARS-Cov-2 infection and should not be used as the sole basis for treatment or other patient management decisions. A negative result may occur with  improper specimen collection/handling, submission of specimen other than nasopharyngeal swab, presence of viral mutation(s) within the areas targeted by this assay, and inadequate number of viral copies(<138 copies/mL). A negative result must be combined with clinical observations, patient history, and epidemiological information. The expected result is Negative.  Fact Sheet for Patients:  bloggercourse.com  Fact Sheet for Healthcare Providers:  seriousbroker.it  This test is no t yet approved or cleared by the [place]  FDA and  has been authorized for detection and/or diagnosis of SARS-CoV-2 by FDA under an Emergency Use Authorization (EUA). This EUA will remain  in effect (meaning this test can be used) for the duration of the COVID-19 declaration under Section 564(b)(1) of the Act, 21 U.S.C.section 360bbb-3(b)(1), unless the authorization is terminated  or revoked sooner.       Influenza A by PCR NEGATIVE NEGATIVE Final   Influenza B by PCR NEGATIVE NEGATIVE Final    Comment: (NOTE) The Xpert Xpress SARS-CoV-2/FLU/RSV plus assay is intended as an aid in the diagnosis of influenza from Nasopharyngeal swab specimens and should not be used as a sole basis for treatment. Nasal washings and aspirates are unacceptable for Xpert Xpress SARS-CoV-2/FLU/RSV testing.  Fact Sheet for Patients: bloggercourse.com  Fact Sheet for Healthcare Providers: seriousbroker.it  This test is not yet approved or cleared by the [place]  FDA and has been authorized for detection and/or diagnosis of SARS-CoV-2 by FDA  under an Emergency Use Authorization (EUA). This EUA will remain in effect (meaning this test can be used) for the duration of the COVID-19 declaration under Section 564(b)(1) of the Act, 21 U.S.C. section 360bbb-3(b)(1), unless the authorization is terminated or revoked.     Resp Syncytial Virus by PCR NEGATIVE NEGATIVE Final    Comment: (NOTE) Fact Sheet for Patients: bloggercourse.com  Fact Sheet for Healthcare Providers: seriousbroker.it  This test is not yet approved or cleared by the [place]  FDA and has been authorized for detection and/or diagnosis of SARS-CoV-2 by FDA under an Emergency Use Authorization (EUA). This EUA will remain in effect (meaning this test can  be used) for the duration of the COVID-19 declaration under Section 564(b)(1) of the Act, 21 U.S.C. section 360bbb-3(b)(1), unless the authorization is terminated or revoked.  Performed at [facility], [address] [place], [place] [address]      Labs: BNP (last 3 results) No results for input(s): BNP in the last 8760 hours. Basic Metabolic Panel: Recent Labs  Lab 01/21/24 1629 01/22/24 0427  NA 140 141  K 4.2 4.3  CL 105 108  CO2 20* 21*  GLUCOSE 181* 176*  BUN 31* 28*  CREATININE 1.54* 1.20*  CALCIUM  9.5 8.9   Liver Function Tests: Recent Labs  Lab 01/21/24 1629  AST 24  ALT 25  ALKPHOS 134*  BILITOT 0.7  PROT 7.1  ALBUMIN 3.9   No results for input(s): LIPASE, AMYLASE in the last 168 hours. No results for input(s): AMMONIA in the last 168 hours. CBC: Recent Labs  Lab 01/21/24 1629 01/22/24 0427  WBC 4.5 3.6*  HGB 11.2* 9.4*  HCT 34.7* 29.2*  MCV 94.3 94.8  PLT 191 151   Cardiac Enzymes: No results for input(s): CKTOTAL, CKMB, CKMBINDEX, TROPONINI in the last 168 hours. BNP: Invalid input(s): POCBNP CBG: Recent Labs  Lab 01/22/24 1757 01/22/24 2048 01/22/24 2114 01/23/24 0755  01/23/24 1151  GLUCAP 224* 151* 152* 128* 213*   D-Dimer Recent Labs    01/22/24 0223  DDIMER >20.00*   Hgb A1c No results for input(s): HGBA1C in the last 72 hours. Lipid Profile No results for input(s): CHOL, HDL, LDLCALC, TRIG, CHOLHDL, LDLDIRECT in the last 72 hours. Thyroid  function studies No results for input(s): TSH, T4TOTAL, T3FREE, THYROIDAB in the last 72 hours.  Invalid input(s): FREET3 Anemia work up No results for input(s): VITAMINB12, FOLATE, FERRITIN, TIBC, IRON, RETICCTPCT in the last 72 hours. Urinalysis    Component Value Date/Time   COLORURINE YELLOW (A) 01/22/2024 0457   APPEARANCEUR HAZY (A) 01/22/2024 0457   LABSPEC 1.018 01/22/2024 0457   PHURINE 5.0 01/22/2024 0457   GLUCOSEU 50 (A) 01/22/2024 0457   HGBUR NEGATIVE 01/22/2024 0457   BILIRUBINUR NEGATIVE 01/22/2024 0457   BILIRUBINUR Negative 01/13/2024 1413   KETONESUR NEGATIVE 01/22/2024 0457   PROTEINUR NEGATIVE 01/22/2024 0457   UROBILINOGEN 0.2 01/13/2024 1413   UROBILINOGEN 1.0 05/08/2007 0651   NITRITE NEGATIVE 01/22/2024 0457   LEUKOCYTESUR SMALL (A) 01/22/2024 0457   Sepsis Labs Recent Labs  Lab 01/21/24 1629 01/22/24 0427  WBC 4.5 3.6*   Microbiology Recent Results (from the past 240 hours)  Resp panel by RT-PCR (RSV, Flu A&B, Covid) Anterior Nasal Swab     Status: None   Collection Time: 01/21/24  8:01 PM   Specimen: Anterior Nasal Swab  Result Value Ref Range Status   SARS Coronavirus 2 by RT PCR NEGATIVE NEGATIVE Final    Comment: (NOTE) SARS-CoV-2 target nucleic acids are NOT DETECTED.  The SARS-CoV-2 RNA is generally detectable in upper respiratory specimens during the acute phase of infection. The lowest concentration of SARS-CoV-2 viral copies this assay can detect is 138 copies/mL. A negative result does not preclude SARS-Cov-2 infection and should not be used as the sole basis for treatment or other patient management decisions.  A negative result may occur with  improper specimen collection/handling, submission of specimen other than nasopharyngeal swab, presence of viral mutation(s) within the areas targeted by this assay, and inadequate number of viral copies(<138 copies/mL). A negative result must be combined with clinical observations, patient history, and epidemiological information. The expected result is  Negative.  Fact Sheet for Patients:  bloggercourse.com  Fact Sheet for Healthcare Providers:  seriousbroker.it  This test is no t yet approved or cleared by the [place]  FDA and  has been authorized for detection and/or diagnosis of SARS-CoV-2 by FDA under an Emergency Use Authorization (EUA). This EUA will remain  in effect (meaning this test can be used) for the duration of the COVID-19 declaration under Section 564(b)(1) of the Act, 21 U.S.C.section 360bbb-3(b)(1), unless the authorization is terminated  or revoked sooner.       Influenza A by PCR NEGATIVE NEGATIVE Final   Influenza B by PCR NEGATIVE NEGATIVE Final    Comment: (NOTE) The Xpert Xpress SARS-CoV-2/FLU/RSV plus assay is intended as an aid in the diagnosis of influenza from Nasopharyngeal swab specimens and should not be used as a sole basis for treatment. Nasal washings and aspirates are unacceptable for Xpert Xpress SARS-CoV-2/FLU/RSV testing.  Fact Sheet for Patients: bloggercourse.com  Fact Sheet for Healthcare Providers: seriousbroker.it  This test is not yet approved or cleared by the [place]  FDA and has been authorized for detection and/or diagnosis of SARS-CoV-2 by FDA under an Emergency Use Authorization (EUA). This EUA will remain in effect (meaning this test can be used) for the duration of the COVID-19 declaration under Section 564(b)(1) of the Act, 21 U.S.C. section 360bbb-3(b)(1), unless the authorization  is terminated or revoked.     Resp Syncytial Virus by PCR NEGATIVE NEGATIVE Final    Comment: (NOTE) Fact Sheet for Patients: bloggercourse.com  Fact Sheet for Healthcare Providers: seriousbroker.it  This test is not yet approved or cleared by the [place]  FDA and has been authorized for detection and/or diagnosis of SARS-CoV-2 by FDA under an Emergency Use Authorization (EUA). This EUA will remain in effect (meaning this test can be used) for the duration of the COVID-19 declaration under Section 564(b)(1) of the Act, 21 U.S.C. section 360bbb-3(b)(1), unless the authorization is terminated or revoked.  Performed at [facility], [address] [place], [place] [address]    Imaging ECHOCARDIOGRAM COMPLETE Result Date: 01/23/2024    ECHOCARDIOGRAM REPORT   Patient Name:   SULEIMA OHLENDORF Date of Exam: 01/23/2024 Medical Rec #:  [id number]    Height:       63.0 in Accession #:    [id number]   Weight:       107.4 lb Date of Birth:  March 22, 1932    BSA:          1.484 m Patient Age:    89 years     BP:           118/57 mmHg Patient Gender: F            HR:           94 bpm. Exam Location:  [facility] Procedure: 2D Echo, 3D Echo, Cardiac Doppler, Color Doppler and Strain Analysis            (Both Spectral and Color Flow Doppler were utilized during            procedure). Indications:     Syncope R55  History:         Patient has prior history of Echocardiogram examinations, most                  recent 02/13/2010. CAD, Prior CABG; Risk Factors:Hypertension                  and Diabetes.  Sonographer:  Christopher Furnace Referring Phys:  [id number] DELAYNE LULLA SOLIAN Diagnosing Phys: Cara JONETTA Lovelace MD  Sonographer Comments: Global longitudinal strain was attempted. IMPRESSIONS  1. Mild septal hypokinesis.  2. Left ventricular ejection fraction, by estimation, is 50 to 55%. The left ventricle has low normal function. The left ventricle has no regional wall  motion abnormalities. Left ventricular diastolic parameters are consistent with Grade I diastolic dysfunction (impaired relaxation). The average left ventricular global longitudinal strain is 8.4 %. The global longitudinal strain is abnormal.  3. Right ventricular systolic function is normal. The right ventricular size is normal.  4. The mitral valve is normal in structure. Trivial mitral valve regurgitation.  5. The aortic valve is grossly normal. Aortic valve regurgitation is not visualized. FINDINGS  Left Ventricle: Left ventricular ejection fraction, by estimation, is 50 to 55%. The left ventricle has low normal function. The left ventricle has no regional wall motion abnormalities. The average left ventricular global longitudinal strain is 8.4 %. Strain was performed and the global longitudinal strain is abnormal. The left ventricular internal cavity size was normal in size. There is no left ventricular hypertrophy. Left ventricular diastolic parameters are consistent with Grade I diastolic dysfunction (impaired relaxation). Right Ventricle: The right ventricular size is normal. No increase in right ventricular wall thickness. Right ventricular systolic function is normal. Left Atrium: Left atrial size was normal in size. Right Atrium: Right atrial size was normal in size. Pericardium: There is no evidence of pericardial effusion. Mitral Valve: The mitral valve is normal in structure. Trivial mitral valve regurgitation. MV peak gradient, 10.0 mmHg. The mean mitral valve gradient is 4.0 mmHg. Tricuspid Valve: The tricuspid valve is normal in structure. Tricuspid valve regurgitation is trivial. Aortic Valve: The aortic valve is grossly normal. Aortic valve regurgitation is not visualized. Aortic valve mean gradient measures 2.5 mmHg. Aortic valve peak gradient measures 4.2 mmHg. Aortic valve area, by VTI measures 3.12 cm. Pulmonic Valve: The pulmonic valve was not well visualized. Pulmonic valve regurgitation is  not visualized. Aorta: The ascending aorta was not well visualized. IAS/Shunts: No atrial level shunt detected by color flow Doppler. Additional Comments: Mild septal hypokinesis. 3D was performed not requiring image post processing on an independent workstation and was abnormal.  LEFT VENTRICLE PLAX 2D LVIDd:         3.50 cm   Diastology LVIDs:         2.60 cm   LV e' medial:    5.87 cm/s LV PW:         0.90 cm   LV E/e' medial:  18.4 LV IVS:        0.90 cm   LV e' lateral:   10.00 cm/s LVOT diam:     2.00 cm   LV E/e' lateral: 10.8 LV SV:         56 LV SV Index:   38        2D Longitudinal Strain LVOT Area:     3.14 cm  2D Strain GLS (A4C):   7.9 % LV IVRT:       103 msec  2D Strain GLS (A3C):   9.0 %                          2D Strain GLS (A2C):   8.3 %                          2D Strain  GLS Avg:     8.4 % RIGHT VENTRICLE RV Basal diam:  3.70 cm     PULMONARY VEINS RV Mid diam:    3.10 cm     Diastolic Velocity: 29.30 cm/s RV S prime:     11.60 cm/s  S/D Velocity:       1.70 TAPSE (M-mode): 1.3 cm      Systolic Velocity:  49.80 cm/s LEFT ATRIUM           Index        RIGHT ATRIUM           Index LA diam:      2.00 cm 1.35 cm/m   RA Area:     11.00 cm LA Vol (A2C): 22.7 ml 15.29 ml/m  RA Volume:   22.80 ml  15.36 ml/m LA Vol (A4C): 10.3 ml 6.94 ml/m  AORTIC VALVE AV Area (Vmax):    2.83 cm AV Area (Vmean):   2.62 cm AV Area (VTI):     3.12 cm AV Vmax:           102.80 cm/s AV Vmean:          74.350 cm/s AV VTI:            0.180 m AV Peak Grad:      4.2 mmHg AV Mean Grad:      2.5 mmHg LVOT Vmax:         92.60 cm/s LVOT Vmean:        62.000 cm/s LVOT VTI:          0.178 m LVOT/AV VTI ratio: 0.99  AORTA Ao Root diam: 2.80 cm MITRAL VALVE                TRICUSPID VALVE MV Area (PHT): 4.99 cm     TR Peak grad:   10.1 mmHg MV Area VTI:   2.00 cm     TR Vmax:        159.00 cm/s MV Peak grad:  10.0 mmHg MV Mean grad:  4.0 mmHg     SHUNTS MV Vmax:       1.58 m/s     Systemic VTI:  0.18 m MV Vmean:      93.6  cm/s    Systemic Diam: 2.00 cm MV Decel Time: 152 msec MV E velocity: 108.00 cm/s MV A velocity: 135.00 cm/s MV E/A ratio:  0.80 Dwayne D Callwood MD Electronically signed by Cara JONETTA Lovelace MD Signature Date/Time: 01/23/2024/1:52:30 PM    Final       Time coordinating discharge: over 30 minutes  SIGNED:  Drea Jurewicz DO [facility]       [1] No Known Allergies  "

## 2024-01-23 NOTE — TOC Initial Note (Signed)
 Transition of Care [facility]) - Initial/Assessment Note    Patient Details  Name: Laura Ferguson MRN: [id number] Date of Birth: 12/08/1932  Transition of Care [facility]) CM/SW Contact:    Nathanael CHRISTELLA Ring, RN Phone Number: 01/23/2024, 1:52 PM  Clinical Narrative:                 CM met with patient at the bedside, her daughter is with her at the bedside, introduced self and explained role in DC planning.  Patient is from home where she lives alone and before she fell and broke her right hip was independent.  She is now staying at home and her daughters are spending the night with her alternating.  She was set up with HH through [facility] and has all needed DME at home.  Daughter and patient would like to return home with [facility] at this discharge.  [place]  with [facility] contacted and she confirmed that patient is open for PT and OT.   Daughter is providing patient's transportation.   Expected Discharge Plan: Home w Home Health Services Barriers to Discharge: Continued Medical Work up   Patient Goals and CMS Choice Patient states their goals for this hospitalization and ongoing recovery are:: To return home with resumption of home health services CMS Medicare.gov Compare Post Acute Care list provided to:: Patient Represenative (must comment) Choice offered to / list presented to : Adult Children      Expected Discharge Plan and Services   Discharge Planning Services: CM Consult   Living arrangements for the past 2 months: Single Family Home                                      Prior Living Arrangements/Services Living arrangements for the past 2 months: Single Family Home Lives with:: Self Patient language and need for interpreter reviewed:: Yes Do you feel safe going back to the place where you live?: Yes      Need for Family Participation in Patient Care: Yes (Comment) Care giver support system in place?: Yes (comment) Current home services: DME, Home OT, Home PT Criminal  Activity/Legal Involvement Pertinent to Current Situation/Hospitalization: No - Comment as needed  Activities of Daily Living   ADL Screening (condition at time of admission) Independently performs ADLs?: Yes (appropriate for developmental age) Is the patient deaf or have difficulty hearing?: No Does the patient have difficulty seeing, even when wearing glasses/contacts?: No Does the patient have difficulty concentrating, remembering, or making decisions?: No  Permission Sought/Granted Permission sought to share information with : Family Supports Permission granted to share information with : Yes, Verbal Permission Granted  Share Information with NAME: Laura Ferguson  Permission granted to share info w AGENCY: [facility]  Permission granted to share info w Relationship: daughter  Permission granted to share info w Contact Information: [phone]  Emotional Assessment Appearance:: Appears stated age Attitude/Demeanor/Rapport: Engaged Affect (typically observed): Accepting Orientation: : Oriented to Self, Oriented to Place, Oriented to Situation Alcohol  / Substance Use: Not Applicable Psych Involvement: No (comment)  Admission diagnosis:  Hyperlipidemia [E78.5] Anxiety and depression [F41.9, F32.A] Orthostatic dizziness [R42] Postural dizziness with presyncope [R42, R55] Type 2 diabetes mellitus without complication, with long-term current use of insulin  (HCC) [E11.9, Z79.4] Hyperlipidemia, unspecified hyperlipidemia type [E78.5] Patient Active Problem List   Diagnosis Date Noted   Postural dizziness with presyncope 01/22/2024   Altered mental status 01/14/2024   Constipation 01/14/2024  Pelvic fracture (HCC) 01/06/2024   Orthostatic hypotension 01/04/2024   Postoperative anemia due to acute blood loss 01/02/2024   Uncontrolled type 2 diabetes mellitus with hyperglycemia, with long-term current use of insulin  (HCC) 01/01/2024   Acute renal failure superimposed on stage 3a  chronic kidney disease (HCC) 01/01/2024   Shortness of breath 01/01/2024   Closed fracture of right hip (HCC) 12/31/2023   Imbalance 11/28/2023   Vertigo 03/18/2023   Pelvic pain 09/21/2022   Uterine fibroid 07/03/2022   GERD (gastroesophageal reflux disease) 07/03/2022   Myalgia 08/04/2020   Fatigue 03/02/2020   Preventative health care 10/13/2019   Other social stressor 08/31/2019   Type 2 diabetes mellitus with retinopathy (HCC) 01/29/2018   Chronic back pain 11/12/2017   Anxiety and depression 10/16/2017   Dizziness 03/26/2015   CAD S/P CABG 12/29/2012   Essential hypertension 12/29/2012   Hyperlipidemia, unspecified 12/29/2012   PCP:  Gretta Comer POUR, NP Pharmacy:   [facility] [address] [place] [address] Phone: [phone] Fax: [phone]     Social Drivers of Health (SDOH) Social History: SDOH Screenings   Food Insecurity: Patient Declined (01/22/2024)  Recent Concern: Food Insecurity - Food Insecurity Present (12/26/2023)   Received from [facility]  Housing: Patient Declined (01/22/2024)  Transportation Needs: Patient Declined (01/22/2024)  Utilities: Patient Declined (01/22/2024)  Alcohol  Screen: Low Risk (01/22/2023)  Depression (PHQ2-9): Low Risk (01/20/2024)  Recent Concern: Depression (PHQ2-9) - Medium Risk (01/13/2024)  Financial Resource Strain: Low Risk  (01/17/2024)   Received from [facility]  Physical Activity: Sufficiently Active (01/17/2024)  Social Connections: Patient Declined (01/22/2024)  Recent Concern: Social Connections - Moderately Isolated (12/31/2023)  Stress: No Stress Concern Present (01/17/2024)  Tobacco Use: Low Risk (01/21/2024)  Health Literacy: Adequate Health Literacy (01/20/2024)   SDOH Interventions:     Readmission Risk Interventions     No data to display

## 2024-01-24 ENCOUNTER — Telehealth: Payer: Self-pay

## 2024-01-24 ENCOUNTER — Ambulatory Visit: Admitting: Primary Care

## 2024-01-24 ENCOUNTER — Telehealth: Payer: Self-pay | Admitting: *Deleted

## 2024-01-24 DIAGNOSIS — S72001D Fracture of unspecified part of neck of right femur, subsequent encounter for closed fracture with routine healing: Secondary | ICD-10-CM

## 2024-01-24 DIAGNOSIS — S32512D Fracture of superior rim of left pubis, subsequent encounter for fracture with routine healing: Secondary | ICD-10-CM

## 2024-01-24 DIAGNOSIS — R296 Repeated falls: Secondary | ICD-10-CM

## 2024-01-24 DIAGNOSIS — R2689 Other abnormalities of gait and mobility: Secondary | ICD-10-CM

## 2024-01-24 DIAGNOSIS — R42 Dizziness and giddiness: Secondary | ICD-10-CM

## 2024-01-24 DIAGNOSIS — I10 Essential (primary) hypertension: Secondary | ICD-10-CM

## 2024-01-24 DIAGNOSIS — E1165 Type 2 diabetes mellitus with hyperglycemia: Secondary | ICD-10-CM

## 2024-01-24 NOTE — Telephone Encounter (Unsigned)
 Copied from CRM [id number]. Topic: Clinical - Order For Equipment >> Jan 24, 2024  9:40 AM Revonda D wrote: Reason for CRM: Pt's daughter Angeline is requesting a letter from Comer Gaskins, NP, in regards to getting a roller for the pt. Angeline stated that she could come pick it up today once its completed and would like a callback with an update.

## 2024-01-24 NOTE — Patient Outreach (Signed)
 Return call from patient's daughter Angeline. Patient has discharged from the hospital, home health through [facility] to resume. PT not recommending skilled care at this time-daughter requesting in home private duty care resources. Resource list sent to daughters email. Daughter requesting rollator walker, agrees to request order from provider.  Tylan Briguglio, LCSW [place]  [facility], [facility] Licensed Clinical Social Worker  Direct Dial: [phone]

## 2024-01-29 NOTE — Addendum Note (Signed)
 Addended by: Jejuan Scala K on: 01/29/2024 09:30 AM   Modules accepted: Orders

## 2024-01-29 NOTE — Telephone Encounter (Signed)
 Yes of course. DME ordered and placed in Kelli's inbox.

## 2024-01-29 NOTE — Telephone Encounter (Signed)
 Spoke with pt's daughter, Angeline. Pt is in need of a rolling walker with a seat. She has an upcoming appointment with Mallie on 02/05/24 but would like to see if this can be ordered before the pt's appointment.

## 2024-01-29 NOTE — Telephone Encounter (Signed)
 Order has been faxed to [employer].

## 2024-01-31 ENCOUNTER — Telehealth: Payer: Self-pay

## 2024-01-31 NOTE — Telephone Encounter (Addendum)
 Per [employer], pt received a w/c on 01/04/24, so insurance will not cover the cane for 5 yrs of that date. Fyi to [place].

## 2024-01-31 NOTE — Telephone Encounter (Signed)
 Copied from CRM [id number]. Topic: Clinical - Home Health Verbal Orders >> Jan 31, 2024 11:41 AM Chasity T wrote: Caller/Agency: [facility]- [facility] Callback Number: [phone] Service Requested: Physical Therapy Frequency: n/a Any new concerns about the patient? Yes (pt should be placed on hold for PT, he would like for a nurse to give him a call back to discuss)

## 2024-01-31 NOTE — Telephone Encounter (Signed)
 A cane or walker will not be approved? The request was for a Rolator walker.

## 2024-02-03 ENCOUNTER — Ambulatory Visit: Payer: Self-pay

## 2024-02-03 NOTE — Telephone Encounter (Signed)
 Can we find out if the patient is eating? She needs to increase protein and make sure to eat during the day.   Her blood sugars were quite high during her hospital stay, but if they are seeing readings in the 50s frequently then okay to reduce Lantus  to 18 units from 20 units.

## 2024-02-03 NOTE — Telephone Encounter (Signed)
 During phone conversation with daughter about medication changes she states that PT told family that patient could not do pt until she was evaluated by cardiology. Family is very upset and confused as to why they told them that.

## 2024-02-03 NOTE — Telephone Encounter (Signed)
 Thanks. What's the reason for holding for PT? PT is approved.

## 2024-02-03 NOTE — Telephone Encounter (Signed)
 Called patient daughter reviewed all information and repeated back to me. Will call if any questions. She will make changes and keep log of food and readings and bring to office.

## 2024-02-03 NOTE — Telephone Encounter (Signed)
 FYI Only or Action Required?: Action required by provider: clinical question for provider.  Patient was last seen in primary care on 01/13/2024 by Rilla Baller, MD.  Called Nurse Triage reporting Blood Sugar Problem.  Symptoms began yesterday.  Interventions attempted: Other: Sugar water.  Symptoms are: completely resolved.  Triage Disposition: Call PCP Within 24 Hours  Patient/caregiver understands and will follow disposition?: Yes   Reason for Disposition  [1] Blood glucose 70 mg/dL (3.9 mmol/L) or below OR symptomatic, now improved with Care Advice AND [2] cause unknown  Answer Assessment - Initial Assessment Questions Pt's daughter Angeline calling in today. Fx hip, in hospital 01/21/24, mildly dehydrated and orthostatic hypotension. Angeline reports being up all night with pt due to blood sugar dropping, around 54. Pt currently stable, blood sugar 160. Hops f/u Wednesday 1/21, pt's daughter would like to know if she needs to come in sooner or what they can do for her blood sugar dropping. Please advise. Requesting CB to daughter Verneita,  [phone]  1. SYMPTOMS: What symptoms are you concerned about?     Denies   2. ONSET:  When did the symptoms start?     Last night   3. BLOOD GLUCOSE: What is your blood glucose level?      54 last night, currently 160  4. TYPE 1 or 2:  Do you know what type of diabetes you have?  (e.g., Type 1, Type 2, Gestational; doesn't know)      Type 2   5. INSULIN : Do you take insulin ? What type of insulin (s) do you use? What is the mode of delivery? (syringe, pen; injection or pump) When did you last give yourself an insulin  dose? (i.e., time or hours/minutes ago) How much did you give? (i.e., how many units)     Lantus  20 units in the AM   6. OTHER SYMPTOMS: Do you have any symptoms? (e.g., fever, frequent urination, difficulty breathing, vomiting)     Denies  7. LOW BLOOD GLUCOSE TREATMENT: What have you done so far to treat  the low blood glucose level?     Peppermint, sugar water  8. FOOD: When did you last eat or drink?       Nothing today  Protocols used: Diabetes - Low Blood Sugar-A-AH  Message from Rea ORN sent at 02/03/2024  9:21 AM EST  Reason for Triage: Pt BG keeps dropping. Pt's daughter Angeline called to advise the BG was 54 last night. They didn't go to ED because she didn't have any other sx like unconsciousness or inability to swallow. Angeline said they can only keep it stable for a few minutes and then it drops back down. Angeline said it seems like they are giving her sugar rushes and then she keeps crashing. Currently 160. Pt has a hospital follow up Wednesday and she wants her to be seen sooner.  P

## 2024-02-03 NOTE — Telephone Encounter (Unsigned)
 Copied from CRM #[id number]. Topic: General - Call Back - No Documentation >> Feb 03, 2024  3:15 PM Rea C wrote: Reason for CRM: Pt was red word earlier and Pt's daughter Angeline /pt have yet to hear back from clinic. They would like someone from the clinic to call Pt's daughter Verneita [phone] [facility])  to follow up on sugar levels and if patient can possibly be seen tomorrow.

## 2024-02-03 NOTE — Telephone Encounter (Signed)
 noted

## 2024-02-04 NOTE — Telephone Encounter (Signed)
Noted. Will discuss during upcoming visit.  

## 2024-02-05 ENCOUNTER — Ambulatory Visit: Admitting: Primary Care

## 2024-02-05 ENCOUNTER — Other Ambulatory Visit: Payer: Self-pay

## 2024-02-05 VITALS — BP 120/80 | HR 92 | Temp 98.6°F | Ht 63.0 in

## 2024-02-05 DIAGNOSIS — I951 Orthostatic hypotension: Secondary | ICD-10-CM

## 2024-02-05 DIAGNOSIS — E1165 Type 2 diabetes mellitus with hyperglycemia: Secondary | ICD-10-CM

## 2024-02-05 DIAGNOSIS — Z794 Long term (current) use of insulin: Secondary | ICD-10-CM

## 2024-02-05 DIAGNOSIS — I10 Essential (primary) hypertension: Secondary | ICD-10-CM | POA: Diagnosis not present

## 2024-02-05 MED ORDER — FREESTYLE LIBRE 3 PLUS SENSOR MISC
1 refills | Status: AC
  Filled 2024-02-05: qty 6, 90d supply, fill #0

## 2024-02-05 NOTE — Assessment & Plan Note (Signed)
 Glucose readings from December hardly show lower readings which is good.  We will have our pharmacy team continue to follow and help with January glucose uploads. Freestyle libre 3+ sensors provided today.  Continue Lantus  18 units daily, Tradjenta  5 mg daily.  Close follow-up next month

## 2024-02-05 NOTE — Progress Notes (Signed)
 "  Subjective:    Patient ID: Laura Ferguson, female    DOB: 04-14-1932, 89 y.o.   MRN: [id number]  Laura Ferguson is a very pleasant 89 y.o. female with a history of hypertension, type 2 diabetes, closed fracture right hip, pelvic fracture, hyperlipidemia, orthostatic dizziness, anxiety depression who presents today for hospital follow-up.  Her daughter joins us  today.  She presented to [facility] ED on 01/21/2024 for 3-week history of orthostasis after surgery for right hip fracture.  Family also mention minimal oral intake since surgery.  Home blood pressure reading was 90/40.  Lab work in the ED revealed mild renal insufficiency.  She underwent MRI brain which showed remote right occipital cortical infarct without acute abnormalityShe was orthostatic and tachycardic in the ED so she was admitted for further evaluation.  During her hospital stay her D-dimer was noted to be elevated.  Family was hesitant for CTA chest but this was not completed.  Patient was asymptomatic for PE.  She did undergo lower extremity venous ultrasound of the right lower extremity which was negative.  She underwent echocardiogram which showed LVEF of 50 to 55%, grade 1 diastolic dysfunction, no significant valve abnormalities.  She was discharged home on 01/23/2024 with home health physical therapy and nursing.  Her metoprolol  was discontinued.  Since her hospital visit her daughter contacted our office with concerns of hypoglycemia at 15.  She was instructed to reduce Lantus  to 18 units.  She is followed by our pharmacy team for diabetes.  Her oral intake has increased and she is drinking plenty of water. She has reduced her Lantus  to 18 units. She denies chest pain, shortness of breath, dizziness. Her freestyle herlene is only showing data from December, the lowest reading was 57. Mostly running mid 100s to low 200s. She was visited by physical therapy once last week by PT who has not returned due to one low blood pressure reading. Her  blood pressure is ranging from mostly low 100s/70s with a few lower and higher readings.   She follows with neurology for mild cognitive impairment.   BP Readings from Last 3 Encounters:  02/05/24 120/80  01/23/24 100/64  01/20/24 114/68     Review of Systems  Respiratory:  Negative for shortness of breath.   Cardiovascular:  Negative for chest pain.  Neurological:  Negative for dizziness.         Past Medical History:  Diagnosis Date   Acute bilateral low back pain with right-sided sciatica 03/16/2022   Acute midline thoracic back pain 03/16/2022   Acute pain of left foot 03/09/2019   Anemia    Anxiety    Arthritis    Blood transfusion without reported diagnosis    CAD (coronary artery disease)    Depression    GERD (gastroesophageal reflux disease)    Glaucoma    History of nuclear stress test 11/22/2011   bruce myoview; normal pattern of perfusio; post-stress EF 76%; low risk scan   Hyperlipidemia    Hypertension    Osteoporosis    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   LIMA to LAD, SVG to ramus intermedius, SVG to OM1 & OM2, SVG to acute marginal, SVG to distal RCA   Stroke (HCC)    Type 2 diabetes mellitus (HCC)     Social History   Socioeconomic History   Marital status: Widowed    Spouse name: Not on file   Number of children: 7   Years of education: Not on file  Highest education level: 12th grade  Occupational History   Not on file  Tobacco Use   Smoking status: Never   Smokeless tobacco: Never   Tobacco comments:    quit 1960's  smoked very lightly.  Vaping Use   Vaping status: Never Used  Substance and Sexual Activity   Alcohol  use: Never   Drug use: Never   Sexual activity: Not Currently  Other Topics Concern   Not on file  Social History Narrative   Married.   Retired.   Works as a Engineer, Structural.    Enjoys helping other.    Social Drivers of Health   Tobacco Use: Low Risk (01/21/2024)   Patient History    Smoking Tobacco Use: Never    Smokeless  Tobacco Use: Never    Passive Exposure: Not on file  Financial Resource Strain: Low Risk  (01/17/2024)   Received from [facility]   Overall Financial Resource Strain (CARDIA)    Difficulty of Paying Living Expenses: Not very hard  Food Insecurity: Patient Declined (01/22/2024)   Epic    Worried About Programme Researcher, Broadcasting/film/video in the Last Year: Patient declined    Barista in the Last Year: Patient declined  Recent Concern: Food Insecurity - Food Insecurity Present (12/26/2023)   Received from [facility]   Epic    Within the past 12 months, you worried that your food would run out before you got the money to buy more.: Often true    Within the past 12 months, the food you bought just didn't last and you didn't have money to get more.: Never true  Transportation Needs: Patient Declined (01/22/2024)   Epic    Lack of Transportation (Medical): Patient declined    Lack of Transportation (Non-Medical): Patient declined  Physical Activity: Sufficiently Active (01/17/2024)   Exercise Vital Sign    Days of Exercise per Week: 6 days    Minutes of Exercise per Session: 40 min  Stress: No Stress Concern Present (01/17/2024)   [employer] of Occupational Health - Occupational Stress Questionnaire    Feeling of Stress: Only a little  Social Connections: Patient Declined (01/22/2024)   Social Connection and Isolation Panel    Frequency of Communication with Friends and Family: Patient declined    Frequency of Social Gatherings with Friends and Family: Patient declined    Attends Religious Services: Patient declined    Database Administrator or Organizations: Patient declined    Attends Banker Meetings: Patient declined    Marital Status: Patient declined  Recent Concern: Social Connections - Moderately Isolated (12/31/2023)   Social Connection and Isolation Panel    Frequency of Communication with Friends and Family: More than three times a week     Frequency of Social Gatherings with Friends and Family: Once a week    Attends Religious Services: More than 4 times per year    Active Member of [employer] or Organizations: No    Attends Banker Meetings: Never    Marital Status: Widowed  Intimate Partner Violence: Patient Declined (01/22/2024)   Epic    Fear of Current or Ex-Partner: Patient declined    Emotionally Abused: Patient declined    Physically Abused: Patient declined    Sexually Abused: Patient declined  Depression (PHQ2-9): High Risk (02/05/2024)   Depression (PHQ2-9)    PHQ-2 Score: 15  Alcohol  Screen: Low Risk (01/22/2023)   Alcohol  Screen    Last Alcohol  Screening  Score (AUDIT): 0  Housing: Patient Declined (01/22/2024)   Epic    Unable to Pay for Housing in the Last Year: Patient declined    Number of Times Moved in the Last Year: Not on file    Homeless in the Last Year: Patient declined  Utilities: Patient Declined (01/22/2024)   Epic    Threatened with loss of utilities: Patient declined  Health Literacy: Adequate Health Literacy (01/20/2024)   B1300 Health Literacy    Frequency of need for help with medical instructions: Never    Past Surgical History:  Procedure Laterality Date   CARDIAC CATHETERIZATION  05/07/2007   EF 55%, focal mild hypocontractility in mid-distal inferior wall & mid posterolateral wall; severe multivessel CAD - susequent CABGx6 (Dr. IVAR Sor)   CORONARY ARTERY BYPASS GRAFT  05/09/2007   LIMA to LAD, veing to intermediate; SVG to OM1 & OM2; SVG to acute marginal & distal RCA (Dr. Kerrin)   FRACTURE SURGERY     HIP SURGERY Right 12/31/2023   INTRAMEDULLARY (IM) NAIL INTERTROCHANTERIC Right 01/01/2024   Procedure: FIXATION, FRACTURE, INTERTROCHANTERIC, WITH INTRAMEDULLARY ROD;  Surgeon: Lorelle Hussar, MD;  Location: [facility] ORS;  Service: Orthopedics;  Laterality: Right;   TRANSTHORACIC ECHOCARDIOGRAM  02/13/2010   EF =>55%, vigorous contraction EF 65%; LA mild-mod dilated; IV  normal diameter - normal CVP; trace MR; mild TR; trace AV regurg    Family History  Problem Relation Age of Onset   Cancer Mother    Diabetes Sister    Cancer Child     Allergies[1]  Medications Ordered Prior to Encounter[2]  BP 120/80   Pulse 92   Temp 98.6 F (37 C) (Oral)   Ht 5' 3 (1.6 m)   SpO2 98%   BMI 19.02 kg/m  Objective:   Physical Exam Cardiovascular:     Rate and Rhythm: Normal rate and regular rhythm.  Pulmonary:     Effort: Pulmonary effort is normal.     Breath sounds: Normal breath sounds.  Musculoskeletal:     Cervical back: Neck supple.  Skin:    General: Skin is warm and dry.  Neurological:     Mental Status: She is alert and oriented to person, place, and time.  Psychiatric:        Mood and Affect: Mood normal.     Physical Exam        Assessment & Plan:  Uncontrolled type 2 diabetes mellitus with hyperglycemia, with long-term current use of insulin  (HCC) Assessment & Plan: Glucose readings from December hardly show lower readings which is good.  We will have our pharmacy team continue to follow and help with January glucose uploads. Freestyle libre 3+ sensors provided today.  Continue Lantus  18 units daily, Tradjenta  5 mg daily.  Close follow-up next month  Orders: -     FreeStyle Libre 3 Plus Sensor; Use to check blood sugar continuously. Change sensor every 15 days.  Dispense: 6 each; Refill: 1  Orthostatic hypotension Assessment & Plan: Improving with oral intake. Commended her on this.  Remain off metoprolol  for now.  Follow up with cardiology as scheduled.    Essential hypertension Assessment & Plan: Controlled.  Continue off metoprolol  for now. Discussed BP parameter goals with family.      Assessment and Plan Assessment & Plan         Comer MARLA Gaskins, NP       [1] No Known Allergies [2]  Current Outpatient Medications on File Prior to Visit  Medication Sig  Dispense Refill   acetaminophen   (TYLENOL ) 325 MG tablet Take 2 tablets (650 mg total) by mouth every 6 (six) hours.     Blood Glucose Monitoring Suppl (ACCU-CHEK GUIDE) w/Device KIT Use as instructed to check blood sugar. 1 kit 0   Cholecalciferol  (VITAMIN D ) 50 MCG (2000 UT) CAPS Take 1 capsule (2,000 Units total) by mouth daily at 12 noon. 90 capsule 1   Continuous Glucose Receiver (FREESTYLE LIBRE 3 READER) DEVI Use to check blood sugar continuously (broken reader, try for lost/damaged med override). E11.65, Z79.4 1 each 0   ezetimibe  (ZETIA ) 10 MG tablet Take 1 tablet (10 mg total) by mouth every evening. 90 tablet 1   glucose blood (ACCU-CHEK GUIDE) test strip USE TO CHECK SUGAR THREE TIMES DAILY AS DIRECTED 250 each 3   HYDROcodone -acetaminophen  (NORCO/VICODIN) 5-325 MG tablet Take 1 tablet by mouth every 6 (six) hours as needed for severe pain (pain score 7-10). 12 tablet 0   Insulin  Pen Needle (BD PEN NEEDLE NANO 2ND GEN) 32G X 4 MM MISC USE 1 PEN NEEDLE WITH INSULIN  PEN once daily 100 each 3   LANTUS  SOLOSTAR 100 UNIT/ML Solostar Pen Inject 20 Units into the skin daily.     linagliptin  (TRADJENTA ) 5 MG TABS tablet Take 1 tablet (5 mg total) by mouth daily at 12 noon. for diabetes. 90 tablet 1   Multiple Vitamin (MULTIVITAMIN) capsule Take 1 capsule by mouth daily.     pantoprazole  (PROTONIX ) 40 MG tablet Take 1 tablet (40 mg total) by mouth in the morning. 90 tablet 3   Polyethyl Glycol-Propyl Glycol (SYSTANE OP) Apply 2 drops to eye 2 (two) times daily.      rosuvastatin  (CRESTOR ) 10 MG tablet Take 1 tablet (10 mg total) by mouth daily at 12 noon. 90 tablet 1   sertraline  (ZOLOFT ) 25 MG tablet Take 1 tablet (25 mg total) by mouth daily at 12 noon. for anxiety and depression. 90 tablet 1   TRAVATAN Z 0.004 % SOLN ophthalmic solution Place 1 drop into both eyes at bedtime.     No current facility-administered medications on file prior to visit.   "

## 2024-02-05 NOTE — Assessment & Plan Note (Signed)
 Controlled.  Continue off metoprolol  for now. Discussed BP parameter goals with family.

## 2024-02-05 NOTE — Patient Instructions (Addendum)
 Please notify me if your blood sugars are running consistently below 70 or consistently above 250.  Your blood pressure should run between 100-140 on top and 60-80 on bottom.   Your heart rate should be between 60 and 100 beats per minute.   I sent a new freestyle libre sensor to your pharmacy.   Continue Tradjenta  5 mg and Lantus  18 units for diabetes.   Remain off metoprolol  blood pressure/heart rate pill for now.  We will see you next month!

## 2024-02-05 NOTE — Assessment & Plan Note (Signed)
 Improving with oral intake. Commended her on this.  Remain off metoprolol  for now.  Follow up with cardiology as scheduled.

## 2024-02-05 NOTE — Telephone Encounter (Signed)
 Patsy from [facility] returning call. She states that she spoke with the patients daughter yesterday and advised that PT is not on hold, that she is scheduled to be seem tomorrow by them. Thinks she got the information incorrect because, last week in the middle of her PT session the patient got dizzy and her BP dropped so the therapist stopped the session for the safety of the patient.   Patsy can be reached at [phone]

## 2024-02-06 ENCOUNTER — Other Ambulatory Visit (HOSPITAL_COMMUNITY): Payer: Self-pay

## 2024-02-06 NOTE — Telephone Encounter (Signed)
 Noted. Glad to know!

## 2024-02-10 ENCOUNTER — Other Ambulatory Visit (HOSPITAL_COMMUNITY): Payer: Self-pay

## 2024-02-11 ENCOUNTER — Other Ambulatory Visit: Payer: Self-pay

## 2024-02-11 ENCOUNTER — Other Ambulatory Visit (HOSPITAL_COMMUNITY): Payer: Self-pay

## 2024-02-11 ENCOUNTER — Other Ambulatory Visit: Payer: Self-pay | Admitting: Primary Care

## 2024-02-11 DIAGNOSIS — E11319 Type 2 diabetes mellitus with unspecified diabetic retinopathy without macular edema: Secondary | ICD-10-CM

## 2024-02-11 MED ORDER — LANTUS SOLOSTAR 100 UNIT/ML ~~LOC~~ SOPN
18.0000 [IU] | PEN_INJECTOR | Freq: Every day | SUBCUTANEOUS | 1 refills | Status: AC
  Filled 2024-02-11 – 2024-02-19 (×2): qty 15, 83d supply, fill #0

## 2024-02-11 MED ORDER — BD PEN NEEDLE NANO 2ND GEN 32G X 4 MM MISC
3 refills | Status: AC
  Filled 2024-02-11: qty 100, 100d supply, fill #0

## 2024-02-12 ENCOUNTER — Other Ambulatory Visit: Payer: Self-pay

## 2024-02-12 ENCOUNTER — Other Ambulatory Visit (HOSPITAL_COMMUNITY): Payer: Self-pay

## 2024-02-13 ENCOUNTER — Telehealth: Payer: Self-pay

## 2024-02-13 DIAGNOSIS — F419 Anxiety disorder, unspecified: Secondary | ICD-10-CM

## 2024-02-13 NOTE — Telephone Encounter (Signed)
 Copied from CRM #[id number]. Topic: Clinical - Medication Question >> Feb 13, 2024 12:58 PM Nessti S wrote: Reason for CRM: pt daughter called because pt has anxiety and she is not sleeping well at night. She wanted to know if pcp could prescribe pt a medicine with helping to sleep. She would like a call back soon as possible

## 2024-02-14 ENCOUNTER — Other Ambulatory Visit: Payer: Self-pay

## 2024-02-14 MED ORDER — HYDROXYZINE HCL 10 MG PO TABS
10.0000 mg | ORAL_TABLET | Freq: Two times a day (BID) | ORAL | 0 refills | Status: AC | PRN
  Filled 2024-02-14: qty 60, 30d supply, fill #0

## 2024-02-14 NOTE — Telephone Encounter (Signed)
 Will send prescription for hydroxyzine  to the pharmacy for her to try as needed. It will make her drowsy so be careful if she takes it during the day.

## 2024-02-14 NOTE — Telephone Encounter (Signed)
 Called and spoke with patient daughter on dpr. Relayed information. Confirmed pharmacy with daughter.  She has no questions  or concerns.

## 2024-02-14 NOTE — Addendum Note (Signed)
 Addended by: Ted Goodner K on: 02/14/2024 07:03 AM   Modules accepted: Orders

## 2024-02-17 ENCOUNTER — Other Ambulatory Visit: Payer: Self-pay

## 2024-02-18 ENCOUNTER — Telehealth: Payer: Self-pay | Admitting: Podiatry

## 2024-02-19 ENCOUNTER — Other Ambulatory Visit: Payer: Self-pay

## 2024-02-20 ENCOUNTER — Encounter: Payer: Self-pay | Admitting: Internal Medicine

## 2024-02-20 ENCOUNTER — Ambulatory Visit: Admitting: Internal Medicine

## 2024-02-20 ENCOUNTER — Other Ambulatory Visit: Payer: Self-pay

## 2024-02-20 VITALS — BP 118/68 | HR 99 | Ht 63.0 in | Wt 115.0 lb

## 2024-02-20 DIAGNOSIS — I471 Supraventricular tachycardia, unspecified: Secondary | ICD-10-CM | POA: Diagnosis not present

## 2024-02-20 DIAGNOSIS — I2510 Atherosclerotic heart disease of native coronary artery without angina pectoris: Secondary | ICD-10-CM | POA: Diagnosis not present

## 2024-02-20 DIAGNOSIS — R002 Palpitations: Secondary | ICD-10-CM

## 2024-02-20 DIAGNOSIS — I951 Orthostatic hypotension: Secondary | ICD-10-CM

## 2024-02-20 DIAGNOSIS — Z79899 Other long term (current) drug therapy: Secondary | ICD-10-CM

## 2024-02-20 MED ORDER — CLOTRIMAZOLE-BETAMETHASONE 1-0.05 % EX CREA
1.0000 | TOPICAL_CREAM | Freq: Every day | CUTANEOUS | 0 refills | Status: AC
  Filled 2024-02-20: qty 45, 30d supply, fill #0

## 2024-02-20 NOTE — Progress Notes (Signed)
 " Cardiology Office Note:  .   Date:  02/20/2024  ID:  Laura Ferguson, DOB 09-06-1932, MRN [id number] PCP: Gretta Comer POUR, NP  [facility] Providers Cardiologist:  Lonni Hanson, MD     History of Present Illness: .   Laura Ferguson is a 89 y.o. female with history of coronary artery disease status post CABG in 2009 (LIMA-LAD, SVG-ramus intermedius, sequential SVG-OM1-OM2, and sequential SVG-acute marginal-distal RCA), hypertension, hyperlipidemia, and type 2 diabetes mellitus, who presents for follow-up of coronary artery disease.  I last saw her in November after she transitioned her care from Dr. Burnard to me.  At that time, her primary concern was dizziness and feeling off balance.  She noted palpitations but was unsure if they corresponded to her dizzy episodes.  We agreed to obtain a 14-day event monitor which showed sinus rhythm with frequent PACs and numerous episodes of PSVT lasting up to almost 1 minute.  EP consultation was recommended but has yet to be arranged.  She was hospitalized last month with orthostatic lightheadedness that worsened after a mechanical fall with right hip fracture that was treated with intramedullary nail placement.  Her metoprolol  was held secondary to persistent orthostatic hypotension.  Echocardiogram showed low normal LVEF with mild septal hypokinesis.  D-dimer was markedly elevated with lower extremity venous Doppler being negative for DVT.  CTA chest not performed at the patient's request (Laura Ferguson and her daughter are not exactly sure why this is, though was likely due to her renal insufficiency).  Today, Laura Ferguson reports that she is getting stronger following her hospitalization in December.  Her physical therapists remain concerned about orthostatic blood pressure drops, though Laura Ferguson denies any lightheadedness or syncope.  She still has some imbalance at times.  She denies chest pain and shortness of breath but still has sporadic palpitations (less  pronounced than at our last visit).  She notes intermittent leg edema, especially when walking or standing for extended periods.  ROS: See HPI  Studies Reviewed: SABRA   EKG Interpretation Date/Time:  Thursday February 20 2024 08:42:55 EST Ventricular Rate:  99 PR Interval:  160 QRS Duration:  74 QT Interval:  344 QTC Calculation: 441 R Axis:   33  Text Interpretation: Sinus rhythm with marked sinus arrhythmia Low voltage QRS Borderline ECG When compared with ECG of 23-Jan-2024 06:57, No significant change was found Confirmed by Emmert Roethler ([id number]) on 02/20/2024 8:48:11 AM    TTE (12/16/2023): Normal LV size and wall thickness.  LVEF 50-55% with subtle septal hypokinesis.  Grade 1 diastolic dysfunction noted with GLS -8.4%.  Normal RV size and function.  Normal biatrial size.  No pericardial effusion.  Trivial MR and TR.  14-day event monitor (11/20/2023): Predominantly sinus rhythm with frequent PACs and 1242 episodes of SVT lasting up to 56 seconds with a maximum rate of 203 bpm.  Risk Assessment/Calculations:             Physical Exam:   VS:  BP 118/68 (BP Location: Left Arm, Patient Position: Sitting, Cuff Size: Normal)   Pulse 99   Ht 5' 3 (1.6 m)   Wt 115 lb (52.2 kg)   BMI 20.37 kg/m    Wt Readings from Last 3 Encounters:  02/20/24 115 lb (52.2 kg)  01/22/24 107 lb 5.8 oz (48.7 kg)  01/20/24 115 lb (52.2 kg)    General:  NAD. Neck: No JVD or HJR. Lungs: Clear to auscultation bilaterally without wheezes or crackles. Heart: Regular  rate and rhythm without murmurs, rubs, or gallops. Abdomen: Soft, nontender, nondistended. Extremities: No lower extremity edema.  ASSESSMENT AND PLAN: .    SVT: Laura Ferguson has a long history of intermittent dizziness, not clearly orthostatic in nature.  As part of workup of this and palpitations, she was found to have frequent episodes of SVT lasting almost a minute at a time.  She was previously on metoprolol , though this was discontinued  during her most recent hospitalization due to orthostatic hypotension.  Her orthostatic vital signs remain positive today, albeit without symptoms.  Will therefore defer rechallenging her with a beta-blocker.  We discussed adding amiodarone to help suppress her SVT, though we have agreed to defer this in favor of formal EP consultation.  I will check a CBC, BMP, magnesium, and TSH today.  Orthostatic hypotension: Intermittent dizziness still reported, though this does not seem to be consistently with positional changes.  I have asked Laura Ferguson to try to drink at least 64 ounces of water per day.  We also discussed wearing compression garments, especially around the thighs and abdomen, to help minimize orthostatic hypotension.  Of note, her D-dimer was extremely elevated during her ED visit last month.  CTA was deferred at the patient's request, likely due to concerns surrounding her renal insufficiency.  Echocardiogram at that time did not show any evidence of RV strain.  Lower extremity venous Dopplers were also normal.  Though PE cannot be entirely excluded, her improving symptoms and lack of dyspnea/hypoxia, argue against this.  We have agreed to defer additional testing at this time as well.  Coronary artery disease: No angina reported.  Recent echocardiogram during hospitalization reassuring with subtle septal wall motion abnormality, which could be related to her prior cardiac surgery.  Continue secondary prevention with aspirin , ezetimibe , and rosuvastatin .  No plans for ischemia evaluation at this time.    Dispo: Return to clinic in 4 months.  Signed, Lonni Hanson, MD  "

## 2024-02-20 NOTE — Telephone Encounter (Signed)
 Patient's daughter Angeline called again and left a message, asking about this medication [phone]

## 2024-02-20 NOTE — Patient Instructions (Addendum)
 Medication Instructions:  Your physician recommends that you continue on your current medications as directed. Please refer to the Current Medication list given to you today.    *If you need a refill on your cardiac medications before your next appointment, please call your pharmacy*  Dr. Mady recommends increase water intake to 64 oz per day   Lab Work: Your provider would like for you to have following labs drawn today CBC, BMP, Mg, TSH.     Testing/Procedures: No test ordered today   Follow-Up: At [facility], you and your health needs are our priority.  As part of our continuing mission to provide you with exceptional heart care, our providers are all part of one team.  This team includes your primary Cardiologist (physician) and Advanced Practice Providers or APPs (Physician Assistants and Nurse Practitioners) who all work together to provide you with the care you need, when you need it.  Your next appointment:   4 month(s)  Provider:   You may see Lonni Mady, MD or one of the following Advanced Practice Providers on your designated Care Team:   Lonni Meager, NP Lesley Maffucci, PA-C Bernardino Bring, PA-C Cadence Franchester, PA-C Tylene Lunch, NP Barnie Hila, NP    Your physician recommends that you schedule a follow-up appointment first available worth EP

## 2024-02-21 ENCOUNTER — Other Ambulatory Visit: Payer: Self-pay

## 2024-02-21 ENCOUNTER — Other Ambulatory Visit (HOSPITAL_COMMUNITY): Payer: Self-pay

## 2024-02-21 LAB — CBC
Hematocrit: 36.2 % (ref 34.0–46.6)
Hemoglobin: 11.4 g/dL (ref 11.1–15.9)
MCH: 29.8 pg (ref 26.6–33.0)
MCHC: 31.5 g/dL (ref 31.5–35.7)
MCV: 95 fL (ref 79–97)
Platelets: 169 10*3/uL (ref 150–450)
RBC: 3.83 x10E6/uL (ref 3.77–5.28)
RDW: 14 % (ref 11.7–15.4)
WBC: 4.4 10*3/uL (ref 3.4–10.8)

## 2024-02-21 LAB — TSH: TSH: 0.358 u[IU]/mL — ABNORMAL LOW (ref 0.450–4.500)

## 2024-02-21 LAB — BASIC METABOLIC PANEL WITH GFR
BUN/Creatinine Ratio: 17 (ref 12–28)
BUN: 20 mg/dL (ref 8–27)
CO2: 18 mmol/L — ABNORMAL LOW (ref 20–29)
Calcium: 9.5 mg/dL (ref 8.7–10.3)
Chloride: 101 mmol/L (ref 96–106)
Creatinine, Ser: 1.2 mg/dL — ABNORMAL HIGH (ref 0.57–1.00)
Glucose: 222 mg/dL — ABNORMAL HIGH (ref 70–99)
Potassium: 4.6 mmol/L (ref 3.5–5.2)
Sodium: 135 mmol/L (ref 134–144)
eGFR: 43 mL/min/{1.73_m2} — ABNORMAL LOW

## 2024-02-21 LAB — MAGNESIUM: Magnesium: 1.9 mg/dL (ref 1.6–2.3)

## 2024-03-04 ENCOUNTER — Ambulatory Visit: Admitting: Primary Care

## 2024-03-25 ENCOUNTER — Ambulatory Visit: Admitting: Cardiology

## 2024-04-10 ENCOUNTER — Ambulatory Visit: Admitting: Podiatry

## 2024-06-24 ENCOUNTER — Ambulatory Visit: Admitting: Internal Medicine
